# Patient Record
Sex: Male | Born: 1960
Health system: Southern US, Community
[De-identification: ages and names within clinical notes are randomized; demographics above are authoritative.]

## PROBLEM LIST (undated history)

## (undated) ENCOUNTER — Emergency Department (HOSPITAL_COMMUNITY): Admission: EM | Payer: Medicare Other | Source: Home / Self Care

## (undated) DIAGNOSIS — E785 Hyperlipidemia, unspecified: Secondary | ICD-10-CM

## (undated) DIAGNOSIS — I10 Essential (primary) hypertension: Secondary | ICD-10-CM

## (undated) DIAGNOSIS — E11621 Type 2 diabetes mellitus with foot ulcer: Secondary | ICD-10-CM

## (undated) DIAGNOSIS — I440 Atrioventricular block, first degree: Secondary | ICD-10-CM

## (undated) DIAGNOSIS — F329 Major depressive disorder, single episode, unspecified: Secondary | ICD-10-CM

## (undated) DIAGNOSIS — M1990 Unspecified osteoarthritis, unspecified site: Secondary | ICD-10-CM

## (undated) DIAGNOSIS — E1142 Type 2 diabetes mellitus with diabetic polyneuropathy: Secondary | ICD-10-CM

## (undated) DIAGNOSIS — Z794 Long term (current) use of insulin: Secondary | ICD-10-CM

## (undated) DIAGNOSIS — G629 Polyneuropathy, unspecified: Secondary | ICD-10-CM

## (undated) DIAGNOSIS — Z8739 Personal history of other diseases of the musculoskeletal system and connective tissue: Secondary | ICD-10-CM

## (undated) DIAGNOSIS — F419 Anxiety disorder, unspecified: Secondary | ICD-10-CM

## (undated) DIAGNOSIS — E119 Type 2 diabetes mellitus without complications: Secondary | ICD-10-CM

## (undated) DIAGNOSIS — L97519 Non-pressure chronic ulcer of other part of right foot with unspecified severity: Secondary | ICD-10-CM

## (undated) DIAGNOSIS — N182 Chronic kidney disease, stage 2 (mild): Secondary | ICD-10-CM

## (undated) DIAGNOSIS — K219 Gastro-esophageal reflux disease without esophagitis: Secondary | ICD-10-CM

## (undated) HISTORY — PX: TOE AMPUTATION: SHX809

## (undated) HISTORY — PX: EYE SURGERY: SHX253

---
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
## 2007-12-18 ENCOUNTER — Emergency Department (HOSPITAL_COMMUNITY): Admission: EM | Admit: 2007-12-18 | Discharge: 2007-12-18 | Payer: Self-pay | Admitting: Emergency Medicine

## 2007-12-18 IMAGING — CT CT PELVIS W/O CM
2 of 4 series · 17 of 46 positions shown, 19 images · IV contrast (agent unspecified)
Comparison: None.

CLINICAL DATA: Left flank pain for 2 weeks increasing today.  Pain with urination.  Question ureteral calculus. 
 ABDOMEN CT WITHOUT CONTRAST:
TECHNIQUE: Multidetector CT imaging of the abdomen was performed following the standard protocol without IV contrast.
TECHNIQUE: Multidetector CT imaging of the pelvis was performed following the standard protocol without IV contrast.

[Series 2: stone_wo 5.0 b40f st · axial · 0.81mm/px · z∈[+679,+1155]mm · 14 of 131 slices shown, 16 images]
[im 6/131  soft-tissue]
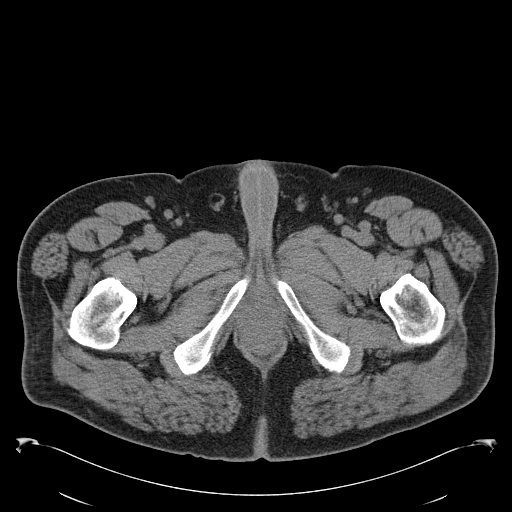
[im 6/131  bone]
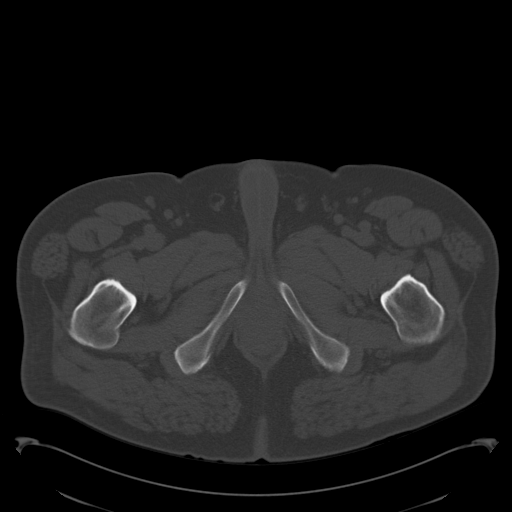
[im 16/131  soft-tissue]
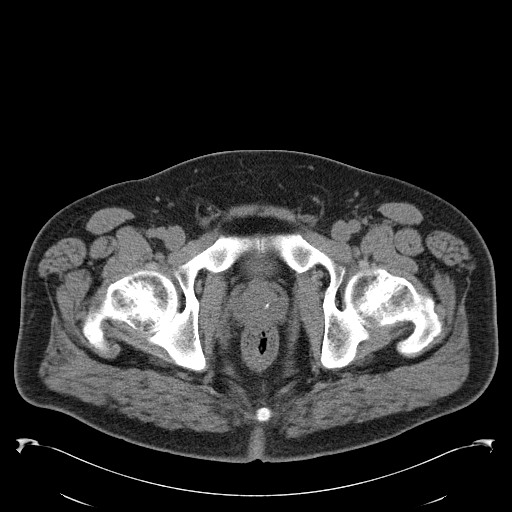
[im 27/131  soft-tissue]
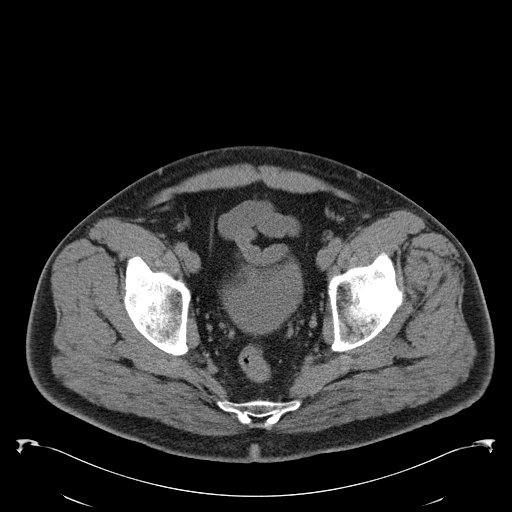
[im 37/131  soft-tissue]
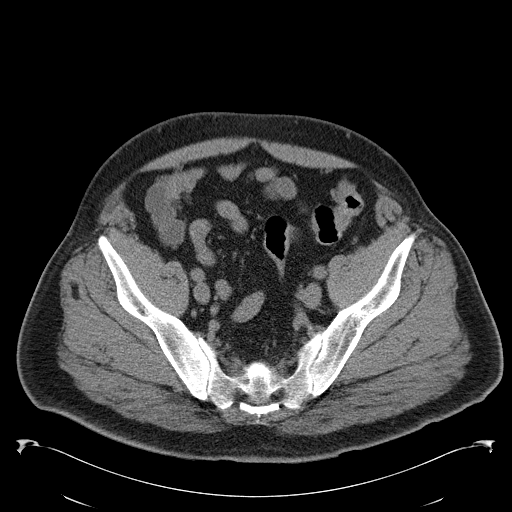
[im 42/131  soft-tissue]
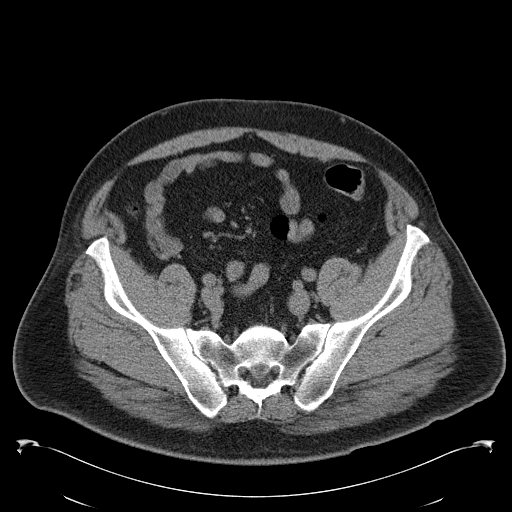
[im 53/131  soft-tissue]
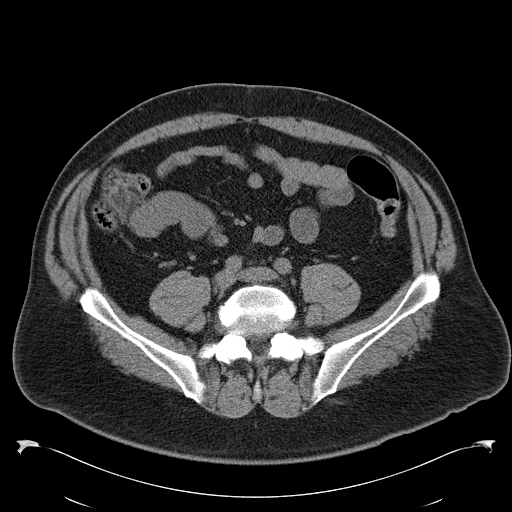
[im 63/131  soft-tissue]
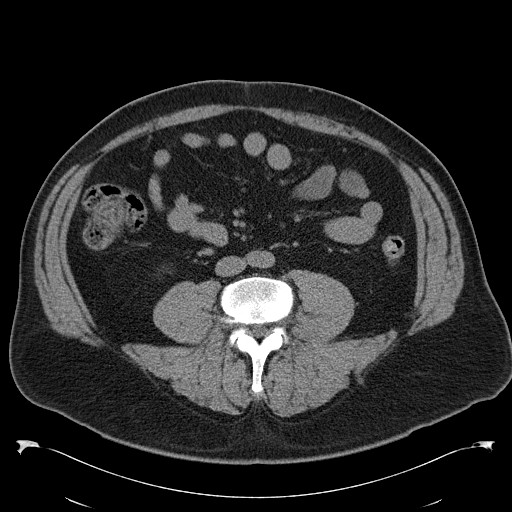
[im 68/131  soft-tissue]
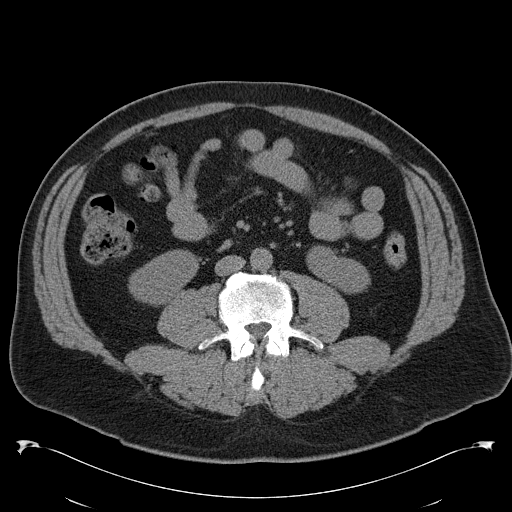
[im 79/131  soft-tissue]
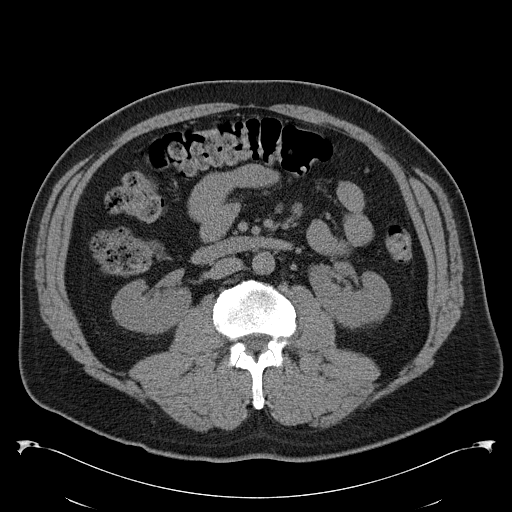
[im 79/131  bone]
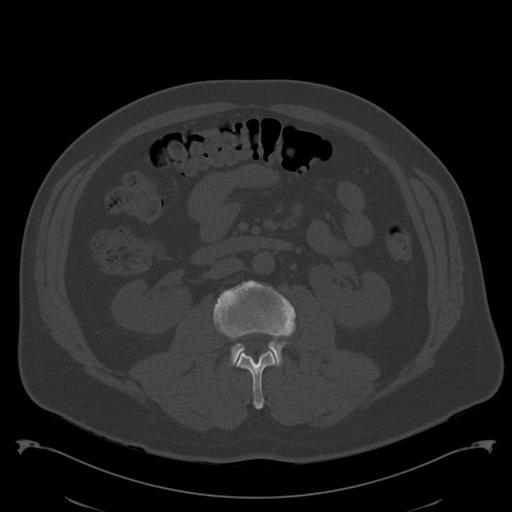
[im 89/131  soft-tissue]
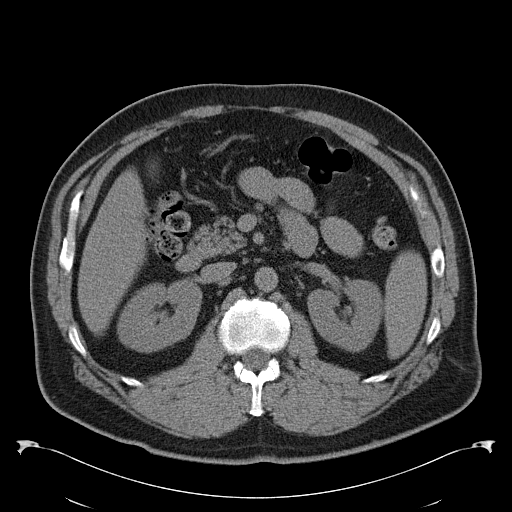
[im 99/131  soft-tissue]
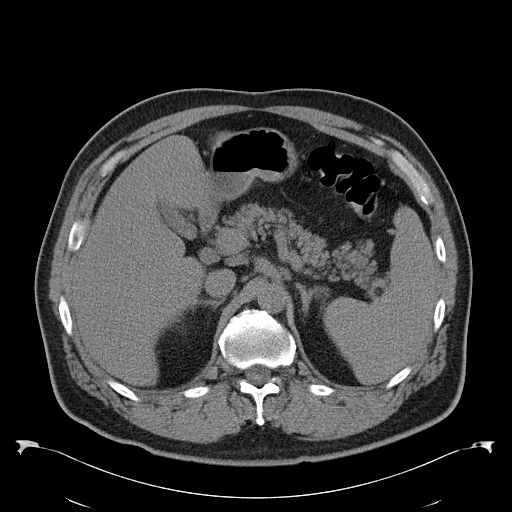
[im 105/131  soft-tissue]
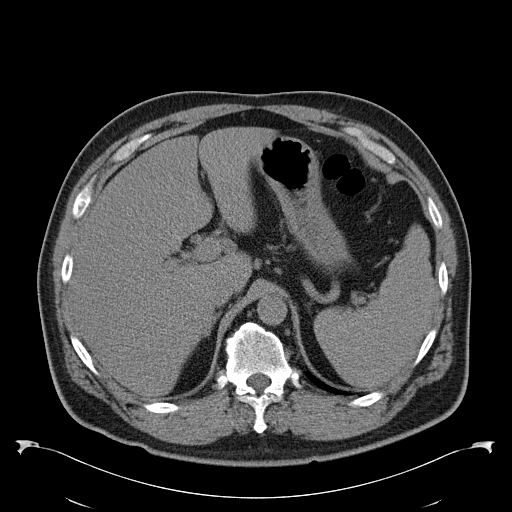
[im 115/131  soft-tissue]
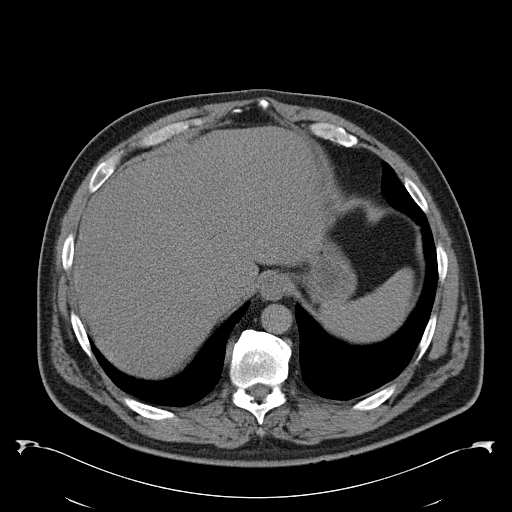
[im 125/131  soft-tissue]
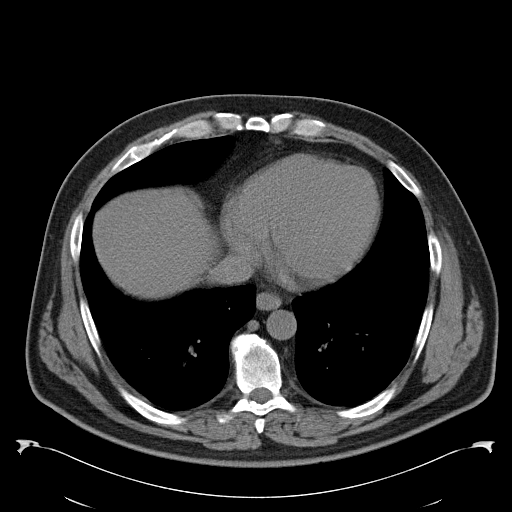

[Series 602: coronal · coronal · 1.06mm/px · 3 of 87 slices shown]
[im 29/87  soft-tissue]
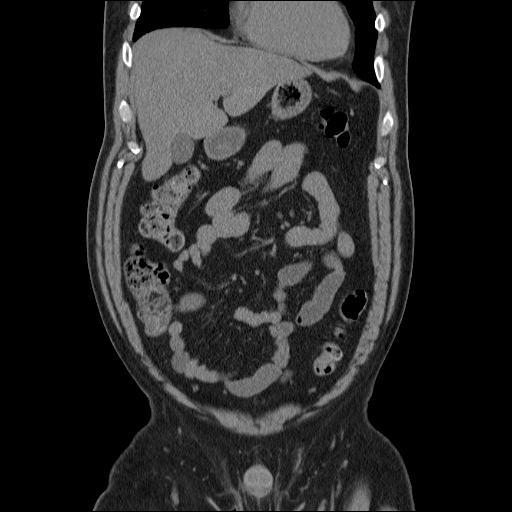
[im 39/87  soft-tissue]
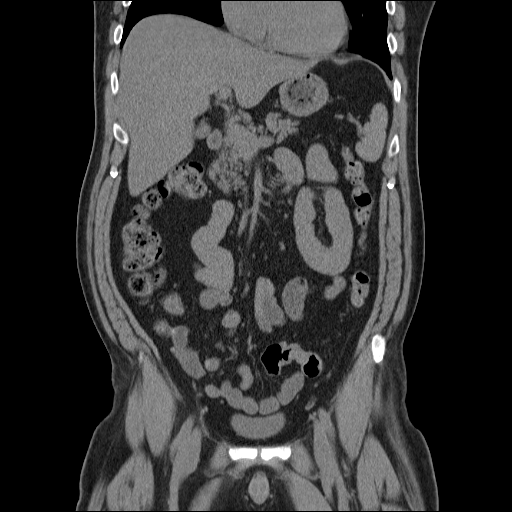
[im 48/87  soft-tissue]
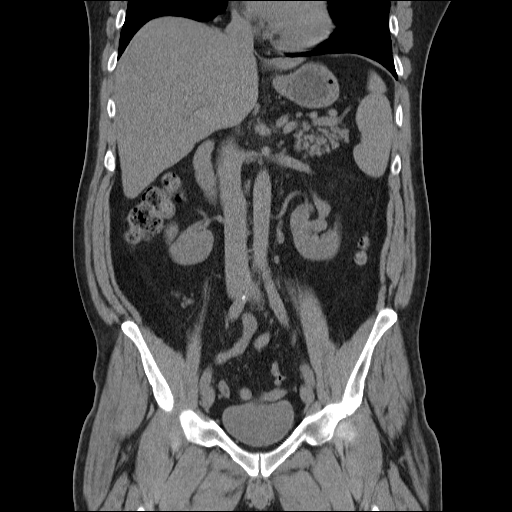

[17 of 46 positions shown; findings below may reference images not displayed]

FINDINGS: No renal or ureteral calculi are demonstrated.  There is no hydronephrosis or perinephric soft tissue stranding.  Both kidneys demonstrate an atypical orientation with outward rotation.  There is no horseshoe kidney configuration, though.  No focal renal abnormalities are seen. 
 The lung bases are clear.  There is no pleural effusion.  The liver, spleen, gallbladder, pancreas, and adrenal glands appear unremarkable as imaged in the unenhanced state.  Normal sized lymph nodes are present in the portocaval space.   There is a small calcified granuloma in the left lower lobe.
IMPRESSION: 1.  No evidence of urinary tract calculus or hydronephrosis.  
 2.  No acute abdominal findings. 
 PELVIS CT WITHOUT CONTRAST:
FINDINGS: Distally, the ureters are normal in caliber.  There is no evidence of ureteral or bladder calculus.  No pelvic mass, fluid collection, or inflammatory process is seen.  The appendix appears normal.
IMPRESSION: Negative for urinary tract calculus or hydronephrosis.  No acute pelvic findings.

## 2009-02-15 ENCOUNTER — Emergency Department (HOSPITAL_COMMUNITY): Admission: EM | Admit: 2009-02-15 | Discharge: 2009-02-15 | Payer: Self-pay | Admitting: Emergency Medicine

## 2011-08-07 LAB — POCT URINE HEMOGLOBIN
Hgb urine dipstick: NEGATIVE
Operator id: 264421

## 2011-08-07 LAB — COMPREHENSIVE METABOLIC PANEL
BUN: 11
Calcium: 9.5
Creatinine, Ser: 0.83
Glucose, Bld: 274 — ABNORMAL HIGH
Total Protein: 6.6

## 2011-08-07 LAB — URINALYSIS, ROUTINE W REFLEX MICROSCOPIC
Bilirubin Urine: NEGATIVE
Leukocytes, UA: NEGATIVE
Nitrite: NEGATIVE
Specific Gravity, Urine: 1.04 — ABNORMAL HIGH
pH: 6.5

## 2014-02-28 ENCOUNTER — Other Ambulatory Visit (HOSPITAL_COMMUNITY): Payer: Self-pay | Admitting: Internal Medicine

## 2014-02-28 ENCOUNTER — Ambulatory Visit (HOSPITAL_COMMUNITY)
Admission: RE | Admit: 2014-02-28 | Discharge: 2014-02-28 | Disposition: A | Discharge status: Home / Self Care | Payer: No Typology Code available for payment source | Source: Ambulatory Visit | Attending: Vascular Surgery | Admitting: Vascular Surgery

## 2014-02-28 ENCOUNTER — Encounter (HOSPITAL_COMMUNITY): Payer: Self-pay

## 2014-02-28 DIAGNOSIS — L97509 Non-pressure chronic ulcer of other part of unspecified foot with unspecified severity: Secondary | ICD-10-CM

## 2014-02-28 DIAGNOSIS — L97409 Non-pressure chronic ulcer of unspecified heel and midfoot with unspecified severity: Secondary | ICD-10-CM | POA: Insufficient documentation

## 2014-07-11 ENCOUNTER — Ambulatory Visit: Payer: Self-pay

## 2014-09-05 ENCOUNTER — Ambulatory Visit: Payer: No Typology Code available for payment source | Admitting: Podiatry

## 2014-09-12 ENCOUNTER — Ambulatory Visit: Payer: No Typology Code available for payment source | Admitting: Podiatry

## 2014-09-24 ENCOUNTER — Ambulatory Visit (INDEPENDENT_AMBULATORY_CARE_PROVIDER_SITE_OTHER): Payer: No Typology Code available for payment source

## 2014-09-24 ENCOUNTER — Encounter: Payer: Self-pay | Admitting: Podiatry

## 2014-09-24 ENCOUNTER — Ambulatory Visit (INDEPENDENT_AMBULATORY_CARE_PROVIDER_SITE_OTHER): Payer: No Typology Code available for payment source | Admitting: Podiatry

## 2014-09-24 VITALS — BP 133/86 | HR 91 | Resp 16 | Ht 72.0 in | Wt 259.0 lb

## 2014-09-24 DIAGNOSIS — L97521 Non-pressure chronic ulcer of other part of left foot limited to breakdown of skin: Secondary | ICD-10-CM

## 2014-09-24 NOTE — Progress Notes (Signed)
   Subjective:    Patient ID: Charles RayaKenneth Thompson, male    DOB: 17-Jun-1961, 53 y.o.   MRN: [id number]  HPI Comments: "I have something with this foot"  Patient c/o aching plantar 1st toe left for about 8 months. He has been trying to walk for exercise. He developed some calluses. He soaks his feet and pulled some thick skin off the toe and caused a hole. The area hasn't healed. He has been using antibiotic ointment.     Review of Systems  All other systems reviewed and are negative.      Objective:   Physical Exam        Assessment & Plan:

## 2014-09-24 NOTE — Patient Instructions (Signed)
Diabetes and Foot Care Diabetes may cause you to have problems because of poor blood supply (circulation) to your feet and legs. This may cause the skin on your feet to become thinner, break easier, and heal more slowly. Your skin may become dry, and the skin may peel and crack. You may also have nerve damage in your legs and feet causing decreased feeling in them. You may not notice minor injuries to your feet that could lead to infections or more serious problems. Taking care of your feet is one of the most important things you can do for yourself.  HOME CARE INSTRUCTIONS  Wear shoes at all times, even in the house. Do not go barefoot. Bare feet are easily injured.  Check your feet daily for blisters, cuts, and redness. If you cannot see the bottom of your feet, use a mirror or ask someone for help.  Wash your feet with warm water (do not use hot water) and mild soap. Then pat your feet and the areas between your toes until they are completely dry. Do not soak your feet as this can dry your skin.  Apply a moisturizing lotion or petroleum jelly (that does not contain alcohol and is unscented) to the skin on your feet and to dry, brittle toenails. Do not apply lotion between your toes.  Trim your toenails straight across. Do not dig under them or around the cuticle. File the edges of your nails with an emery board or nail file.  Do not cut corns or calluses or try to remove them with medicine.  Wear clean socks or stockings every day. Make sure they are not too tight. Do not wear knee-high stockings since they may decrease blood flow to your legs.  Wear shoes that fit properly and have enough cushioning. To break in new shoes, wear them for just a few hours a day. This prevents you from injuring your feet. Always look in your shoes before you put them on to be sure there are no objects inside.  Do not cross your legs. This may decrease the blood flow to your feet.  If you find a minor scrape,  cut, or break in the skin on your feet, keep it and the skin around it clean and dry. These areas may be cleansed with mild soap and water. Do not cleanse the area with peroxide, alcohol, or iodine.  When you remove an adhesive bandage, be sure not to damage the skin around it.  If you have a wound, look at it several times a day to make sure it is healing.  Do not use heating pads or hot water bottles. They may burn your skin. If you have lost feeling in your feet or legs, you may not know it is happening until it is too late.  Make sure your health care provider performs a complete foot exam at least annually or more often if you have foot problems. Report any cuts, sores, or bruises to your health care provider immediately. SEEK MEDICAL CARE IF:   You have an injury that is not healing.  You have cuts or breaks in the skin.  You have an ingrown nail.  You notice redness on your legs or feet.  You feel burning or tingling in your legs or feet.  You have pain or cramps in your legs and feet.  Your legs or feet are numb.  Your feet always feel cold. SEEK IMMEDIATE MEDICAL CARE IF:   There is increasing redness,   swelling, or pain in or around a wound.  There is a red line that goes up your leg.  Pus is coming from a wound.  You develop a fever or as directed by your health care provider.  You notice a bad smell coming from an ulcer or wound. Document Released: 10/30/2000 Document Revised: 07/05/2013 Document Reviewed: 04/11/2013 [employer] Patient Information 2015 [employer]. This information is not intended to replace advice given to you by your health care provider. Make sure you discuss any questions you have with your health care provider.  

## 2014-09-25 NOTE — Progress Notes (Signed)
Subjective:     Patient ID: Charles Thompson, male   DOB: June 28, 1961, 53 y.o.   MRN: [id number]  HPIpatient presents stating he's had a lesion on the bottom of his big toe for 8 months left heel been trying to walk for exercise developed calluses and then this broke down. He is a long-term diabetic who is not in very good control and is trying to exercise in order to improve his physical condition   Review of Systems  All other systems reviewed and are negative.      Objective:   Physical Exam  Constitutional: He is oriented to person, place, and time.  Cardiovascular: Intact distal pulses.   Musculoskeletal: Normal range of motion.  Neurological: He is oriented to person, place, and time.  Skin: Skin is warm and dry.  Nursing note and vitals reviewed. neurological sensation is diminished on both feet both sharp dull and vibratory. He is noted to have palpable pulses and has a relatively high arch foot type with severe keratotic lesion underneath the hallux right and a keratotic lesion left with a breakdown in the center portion with no odor noted and mild erythema in the hallux with no proximal edema erythema or drainage noted. Patient is noted to have good digital perfusion and is well oriented 3     Assessment:     Structural changes leading to chronic callus formation and along with neuropathy has led to a breakdown of tissue plantar aspect left that is chronic in nature and is localized    Plan:     H&P and x-rays reviewed. At this time deep debridement of lesion accomplished and I applied Iodosorb and thick padding to try to take pressure off the area. We will review this again in the next several weeks and he is also placed on cephalexin 500 mg 3 times a day. I explained there is no guarantee that this will heal and that ultimately he may require hallux amputation

## 2014-10-01 ENCOUNTER — Ambulatory Visit (INDEPENDENT_AMBULATORY_CARE_PROVIDER_SITE_OTHER): Payer: No Typology Code available for payment source | Admitting: Podiatry

## 2014-10-01 ENCOUNTER — Encounter: Payer: Self-pay | Admitting: Podiatry

## 2014-10-01 VITALS — BP 140/71 | HR 90 | Resp 16

## 2014-10-01 DIAGNOSIS — L97521 Non-pressure chronic ulcer of other part of left foot limited to breakdown of skin: Secondary | ICD-10-CM

## 2014-10-01 MED ORDER — CADEXOMER IODINE 0.9 % EX GEL: 1.0000 "application " | Freq: Every day | CUTANEOUS | Status: DC | PRN

## 2014-10-02 NOTE — Progress Notes (Signed)
Subjective:     Patient ID: Charles Thompson, male   DOB: 15-Jan-1961, 53 y.o.   MRN: [id number]  HPIpatient states my toe seems a little better and I like to continue to try to heal this   Review of Systems     Objective:   Physical Exam Neurovascular status unchanged with ulceration plantar left hallux in a patient long-term diabetic with relatively extensive neuropathic changes    Assessment:     Ulcerations secondary to compression and diabetes with diminished neurological function    Plan:     Reviewed condition and debrided area up flushed and applied Iodosorb and wrote him Iodosorb for home usage and continued padding usage. No current erythema in the digits but if it were to turn red become swollen or started draining patient is to let us know immediately and if he should develop any systemic signs of infection he is to go straight to the emergency room

## 2015-04-17 ENCOUNTER — Emergency Department (HOSPITAL_COMMUNITY): Payer: No Typology Code available for payment source

## 2015-04-17 ENCOUNTER — Encounter (HOSPITAL_COMMUNITY): Payer: Self-pay | Admitting: Emergency Medicine

## 2015-04-17 ENCOUNTER — Emergency Department (HOSPITAL_COMMUNITY)
Admission: EM | Admit: 2015-04-17 | Discharge: 2015-04-17 | Disposition: A | Discharge status: Home / Self Care | Payer: No Typology Code available for payment source | Attending: Emergency Medicine | Admitting: Emergency Medicine

## 2015-04-17 DIAGNOSIS — Z7982 Long term (current) use of aspirin: Secondary | ICD-10-CM | POA: Insufficient documentation

## 2015-04-17 DIAGNOSIS — Z720 Tobacco use: Secondary | ICD-10-CM | POA: Insufficient documentation

## 2015-04-17 DIAGNOSIS — L089 Local infection of the skin and subcutaneous tissue, unspecified: Secondary | ICD-10-CM | POA: Diagnosis present

## 2015-04-17 DIAGNOSIS — E11621 Type 2 diabetes mellitus with foot ulcer: Secondary | ICD-10-CM | POA: Insufficient documentation

## 2015-04-17 DIAGNOSIS — Z79899 Other long term (current) drug therapy: Secondary | ICD-10-CM | POA: Diagnosis not present

## 2015-04-17 DIAGNOSIS — Z8669 Personal history of other diseases of the nervous system and sense organs: Secondary | ICD-10-CM | POA: Diagnosis not present

## 2015-04-17 DIAGNOSIS — L97519 Non-pressure chronic ulcer of other part of right foot with unspecified severity: Secondary | ICD-10-CM | POA: Diagnosis not present

## 2015-04-17 DIAGNOSIS — I10 Essential (primary) hypertension: Secondary | ICD-10-CM | POA: Diagnosis not present

## 2015-04-17 DIAGNOSIS — L97509 Non-pressure chronic ulcer of other part of unspecified foot with unspecified severity: Secondary | ICD-10-CM

## 2015-04-17 DIAGNOSIS — Z794 Long term (current) use of insulin: Secondary | ICD-10-CM | POA: Insufficient documentation

## 2015-04-17 HISTORY — DX: Type 2 diabetes mellitus without complications: E11.9

## 2015-04-17 HISTORY — DX: Essential (primary) hypertension: I10

## 2015-04-17 HISTORY — DX: Polyneuropathy, unspecified: G62.9

## 2015-04-17 LAB — COMPREHENSIVE METABOLIC PANEL
ALBUMIN: 4.4 g/dL (ref 3.5–5.0)
ALT: 42 U/L (ref 17–63)
ANION GAP: 12 (ref 5–15)
AST: 21 U/L (ref 15–41)
Alkaline Phosphatase: 81 U/L (ref 38–126)
BUN: 27 mg/dL — ABNORMAL HIGH (ref 6–20)
CHLORIDE: 97 mmol/L — AB (ref 101–111)
CO2: 25 mmol/L (ref 22–32)
Calcium: 9.9 mg/dL (ref 8.9–10.3)
Creatinine, Ser: 1.19 mg/dL (ref 0.61–1.24)
Glucose, Bld: 187 mg/dL — ABNORMAL HIGH (ref 65–99)
Potassium: 4.2 mmol/L (ref 3.5–5.1)
Sodium: 134 mmol/L — ABNORMAL LOW (ref 135–145)
Total Bilirubin: 0.6 mg/dL (ref 0.3–1.2)
Total Protein: 7.8 g/dL (ref 6.5–8.1)

## 2015-04-17 LAB — CBC WITH DIFFERENTIAL/PLATELET
Basophils Absolute: 0 10*3/uL (ref 0.0–0.1)
Basophils Relative: 0 % (ref 0–1)
EOS PCT: 2 % (ref 0–5)
Eosinophils Absolute: 0.2 10*3/uL (ref 0.0–0.7)
HEMATOCRIT: 47.6 % (ref 39.0–52.0)
HEMOGLOBIN: 16.7 g/dL (ref 13.0–17.0)
LYMPHS ABS: 2.7 10*3/uL (ref 0.7–4.0)
Lymphocytes Relative: 28 % (ref 12–46)
MCH: 30.2 pg (ref 26.0–34.0)
MCHC: 35.1 g/dL (ref 30.0–36.0)
MCV: 86.1 fL (ref 78.0–100.0)
MONO ABS: 0.7 10*3/uL (ref 0.1–1.0)
MONOS PCT: 7 % (ref 3–12)
NEUTROS ABS: 6.2 10*3/uL (ref 1.7–7.7)
Neutrophils Relative %: 63 % (ref 43–77)
PLATELETS: 260 10*3/uL (ref 150–400)
RBC: 5.53 MIL/uL (ref 4.22–5.81)
RDW: 12.4 % (ref 11.5–15.5)
WBC: 9.8 10*3/uL (ref 4.0–10.5)

## 2015-04-17 IMAGING — CR DG TOE 3RD 2+V*R*
4 series · 4 of 4 positions shown · non-contrast
Comparison: [DATE].

CLINICAL DATA: RIGHT third toe wound. Injury 3 days ago.Diabetic
foot wound.

EXAM:
RIGHT THIRD TOE

[x toes ap right]
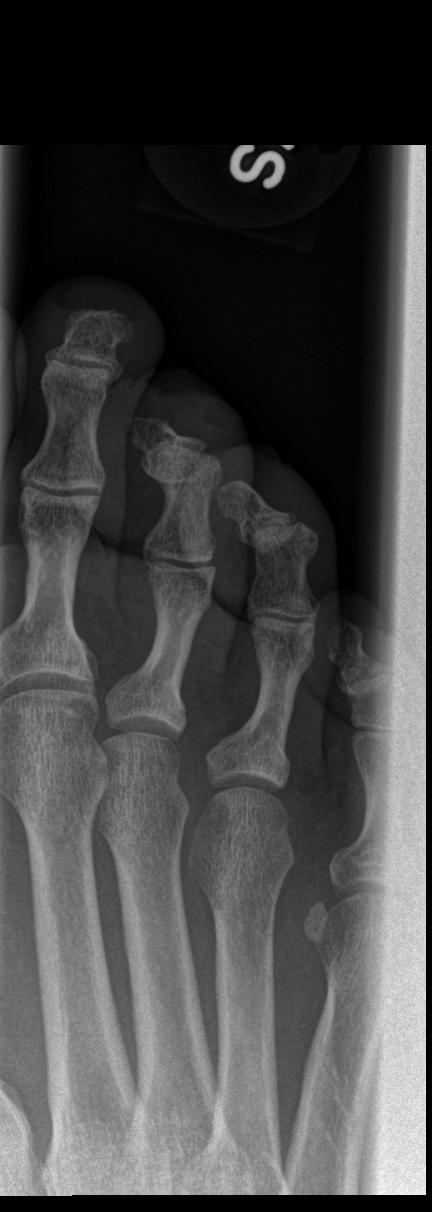

[x toes obl right]
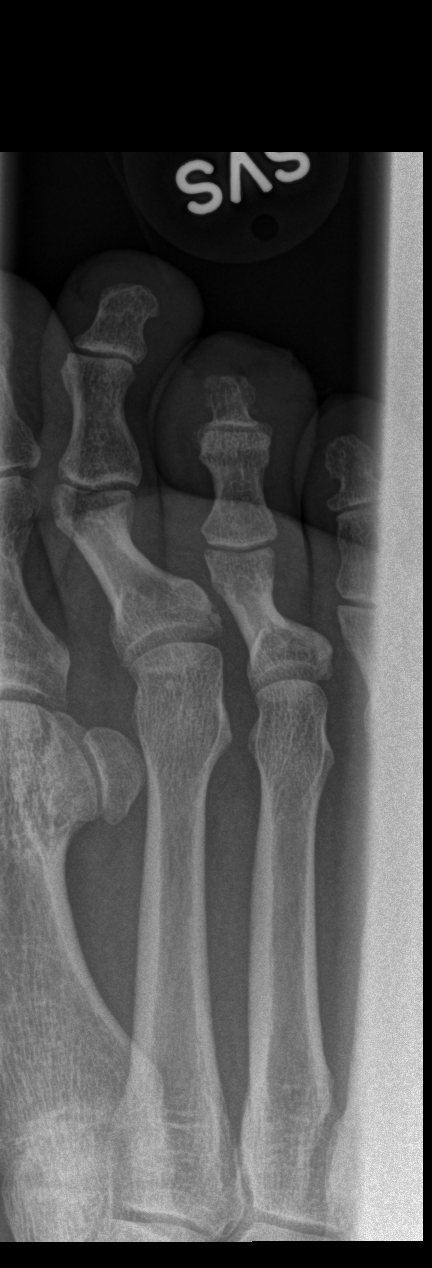

[x toes lat right (1 of 2)]
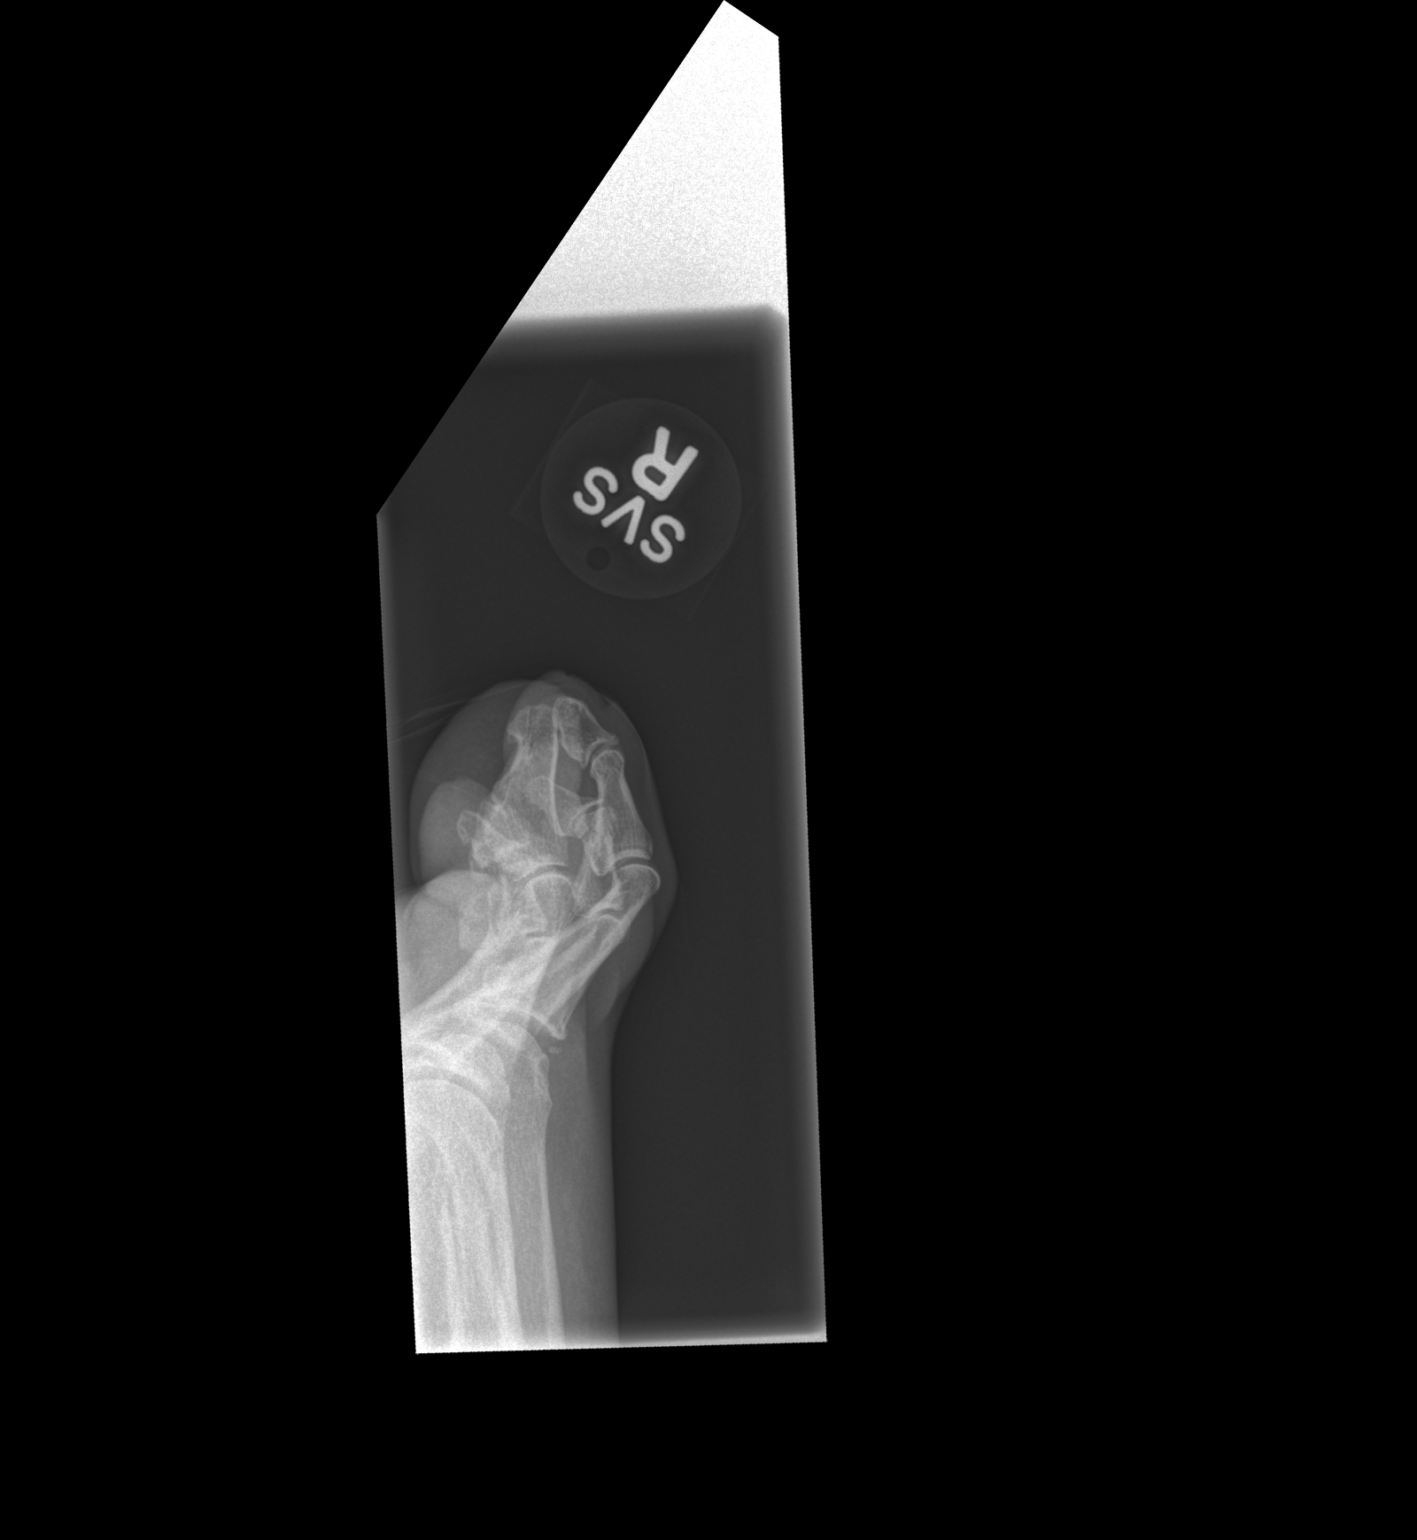

[x toes lat right (2 of 2)]
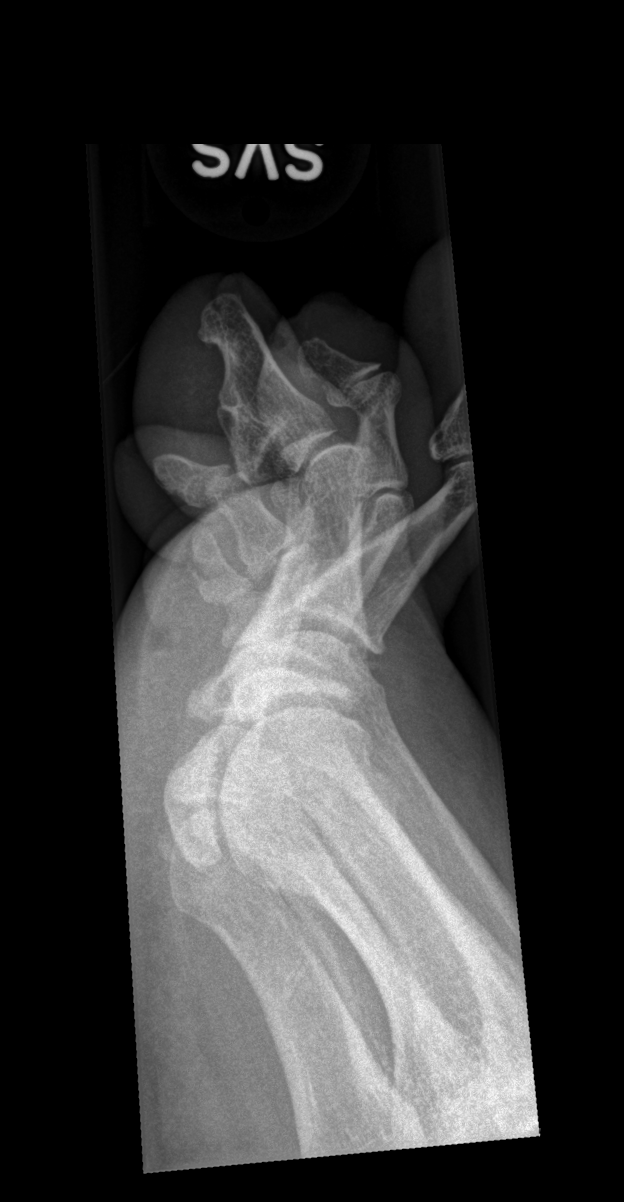

[4 of 4 positions shown; findings below may reference images not displayed]

FINDINGS: Soft tissue swelling is present in the third toe. There is soft
tissue irregularity over the terminal phalanx, likely the clinically
described wound. No plain film evidence of osteomyelitis. No gas
dissecting in the soft tissues. Flexion deformity of the toes noted
on radiographs.
IMPRESSION: Soft tissue swelling and irregularity in the third toe without
osteomyelitis.

## 2015-04-17 MED ORDER — DOXYCYCLINE HYCLATE 100 MG PO TABS: 100.0000 mg | ORAL_TABLET | Freq: Two times a day (BID) | ORAL | Status: DC

## 2015-04-17 MED ORDER — CEPHALEXIN 500 MG PO CAPS
500.0000 mg | ORAL_CAPSULE | Freq: Once | ORAL | Status: AC
  Administered 2015-04-17: 500 mg via ORAL
  Filled 2015-04-17: qty 1

## 2015-04-17 MED ORDER — DOXYCYCLINE HYCLATE 100 MG PO TABS
100.0000 mg | ORAL_TABLET | Freq: Once | ORAL | Status: AC
  Administered 2015-04-17: 100 mg via ORAL
  Filled 2015-04-17: qty 1

## 2015-04-17 MED ORDER — CEPHALEXIN 500 MG PO CAPS: 500.0000 mg | ORAL_CAPSULE | Freq: Two times a day (BID) | ORAL | Status: DC

## 2015-04-17 NOTE — ED Provider Notes (Signed)
CSN: [id number]     Arrival date & time 04/17/15  1822 History   First MD Initiated Contact with Patient 04/17/15 1942     Chief Complaint  Patient presents with  . Recurrent Skin Infections     (Consider location/radiation/quality/duration/timing/severity/associated sxs/prior Treatment) HPI  Ladean RayaKenneth Pizano is a 54 y.o. male with past medical history of diabetes and peripheral nephropathy presenting today with right middle toe drainage.  3 days ago patient had a corn on his toe which he attempted to cut with a Reeser at home. Subsequently he developed swelling in his foot, erythema of the toe and bloody drainage. He denies any purulent drainage. He saw his podiatrist today recommended he come to emergency department for treatment of cellulitis. He denies any fevers or chills. Patient does not have any significant pain in the area but does have a history of diabetic neuropathy. He has no further complaints.  10 Systems reviewed and are negative for acute change except as noted in the HPI.    Past Medical History  Diagnosis Date  . Peripheral neuropathy   . Diabetes mellitus without complication   . Hypertension    Past Surgical History  Procedure Laterality Date  . Eye surgery     No family history on file. History  Substance Use Topics  . Smoking status: Current Some Day Smoker    Types: Cigarettes  . Smokeless tobacco: Not on file  . Alcohol Use: No    Review of Systems    Allergies  Review of patient's allergies indicates no known allergies.  Home Medications   Prior to Admission medications   Medication Sig Start Date End Date Taking? Authorizing Provider  amLODipine (NORVASC) 10 MG tablet Take 10 mg by mouth daily. 04/04/15  Yes Historical Provider, MD  aspirin 81 MG tablet Take 81 mg by mouth daily.   Yes Historical Provider, MD  atorvastatin (LIPITOR) 10 MG tablet Take 10 mg by mouth daily. 04/04/15  Yes Historical Provider, MD  INVOKANA 300 MG TABS tablet Take 300  mg by mouth daily. 04/04/15  Yes Historical Provider, MD  KOMBIGLYZE XR 2.03-999 MG TB24 Take 1 tablet by mouth 2 (two) times daily. 04/04/15  Yes Historical Provider, MD  omeprazole (PRILOSEC) 20 MG capsule Take 20 mg by mouth daily as needed (heartburn).  02/08/15  Yes Historical Provider, MD  TOUJEO SOLOSTAR 300 UNIT/ML SOPN Inject 65 Units into the skin 2 (two) times daily. 03/25/15  Yes Historical Provider, MD  cadexomer iodine (IODOSORB) 0.9 % gel Apply 1 application topically daily as needed for wound care. Patient not taking: Reported on 04/17/2015 10/01/14   Kirstie PeriNorman S Regal, DPM   BP 133/106 mmHg  Pulse 82  Temp(Src) 97.9 F (36.6 C) (Oral)  Resp 20  SpO2 99% Physical Exam  Constitutional: He is oriented to person, place, and time. Vital signs are normal. He appears well-developed and well-nourished.  Non-toxic appearance. He does not appear ill. No distress.  HENT:  Head: Normocephalic and atraumatic.  Nose: Nose normal.  Mouth/Throat: Oropharynx is clear and moist. No oropharyngeal exudate.  Eyes: Conjunctivae and EOM are normal. Pupils are equal, round, and reactive to light. No scleral icterus.  Neck: Normal range of motion. Neck supple. No tracheal deviation, no edema, no erythema and normal range of motion present. No thyroid mass and no thyromegaly present.  Cardiovascular: Normal rate, regular rhythm, S1 normal, S2 normal, normal heart sounds, intact distal pulses and normal pulses.  Exam reveals no gallop and no  friction rub.   No murmur heard. Pulses:      Radial pulses are 2+ on the right side, and 2+ on the left side.       Dorsalis pedis pulses are 2+ on the right side, and 2+ on the left side.  Pulmonary/Chest: Effort normal and breath sounds normal. No respiratory distress. He has no wheezes. He has no rhonchi. He has no rales.  Abdominal: Soft. Normal appearance and bowel sounds are normal. He exhibits no distension, no ascites and no mass. There is no hepatosplenomegaly.  There is no tenderness. There is no rebound, no guarding and no CVA tenderness.  Musculoskeletal: Normal range of motion. He exhibits no edema or tenderness.  Right third toe with diabetic ulcer at the distal tip. It is malodorous, no purulent drainage. No tenderness to palpation. There is erythema of the right third toe as well. Mild swelling seen to the ankle.  Lymphadenopathy:    He has no cervical adenopathy.  Neurological: He is alert and oriented to person, place, and time. He has normal strength. No cranial nerve deficit or sensory deficit.  Skin: Skin is warm, dry and intact. No petechiae and no rash noted. He is not diaphoretic. No erythema. No pallor.  Psychiatric: He has a normal mood and affect. His behavior is normal. Judgment normal.  Nursing note and vitals reviewed.   ED Course  Procedures (including critical care time) Labs Review Labs Reviewed  COMPREHENSIVE METABOLIC PANEL - Abnormal; Notable for the following:    Sodium 134 (*)    Chloride 97 (*)    Glucose, Bld 187 (*)    BUN 27 (*)    All other components within normal limits  CBC WITH DIFFERENTIAL/PLATELET    Imaging Review Dg Toe 3rd Right  04/17/2015   CLINICAL DATA:  RIGHT third toe wound. Injury 3 days ago.Diabetic foot wound.  EXAM: RIGHT THIRD TOE  COMPARISON:  09/24/2014.  FINDINGS: Soft tissue swelling is present in the third toe. There is soft tissue irregularity over the terminal phalanx, likely the clinically described wound. No plain film evidence of osteomyelitis. No gas dissecting in the soft tissues. Flexion deformity of the toes noted on radiographs.  IMPRESSION: Soft tissue swelling and irregularity in the third toe without osteomyelitis.   Electronically Signed   By: Andreas Newport M.D.   On: 04/17/2015 20:24     EKG Interpretation None      MDM   Final diagnoses:  None    Patient presents emergency department for infection of the right third toe. Appears to be a diabetic ulcer that is  not odorous. Likely soft tissue infection. Will obtain x-ray to evaluate for possible osteomyelitis.  Xray negative for osteo.  We'll treat for 2 weeks with Keflex and doxycycline for infection. Podiatry follow-up was advised within 3 days. Patient overall appears well in no acute distress. Vital signs remain within his normal limits and he is safe for discharge.  Tomasita Crumble, MD 04/17/15 2045

## 2015-04-17 NOTE — ED Notes (Signed)
Patient transported to X-ray 

## 2015-04-17 NOTE — Discharge Instructions (Signed)
[facility] Charles Thompson, your x-ray does not show any infection of the bone. Take antibodies for 2 weeks and see your podiatrist within 3 days for close follow-up. Be sure to wash your toe twice per day very well with soap and water. If symptoms worsen come back to the emergency department immediately. Thank you. Diabetes may cause you to have problems because of poor blood supply (circulation) to your feet and legs. This may cause the skin on your feet to become thinner, break easier, and heal more slowly. Your skin may become dry, and the skin may peel and crack. You may also have nerve damage in your legs and feet causing decreased feeling in them. You may not notice minor injuries to your feet that could lead to infections or more serious problems. Taking care of your feet is one of the most important things you can do for yourself.  HOME CARE INSTRUCTIONS  Wear shoes at all times, even in the house. Do not go barefoot. Bare feet are easily injured.  Check your feet daily for blisters, cuts, and redness. If you cannot see the bottom of your feet, use a mirror or ask someone for help.  Wash your feet with warm water (do not use hot water) and mild soap. Then pat your feet and the areas between your toes until they are completely dry. Do not soak your feet as this can dry your skin.  Apply a moisturizing lotion or petroleum jelly (that does not contain alcohol and is unscented) to the skin on your feet and to dry, brittle toenails. Do not apply lotion between your toes.  Trim your toenails straight across. Do not dig under them or around the cuticle. File the edges of your nails with an emery board or nail file.  Do not cut corns or calluses or try to remove them with medicine.  Wear clean socks or stockings every day. Make sure they are not too tight. Do not wear knee-high stockings since they may decrease blood flow to your legs.  Wear shoes that fit properly and have enough  cushioning. To break in new shoes, wear them for just a few hours a day. This prevents you from injuring your feet. Always look in your shoes before you put them on to be sure there are no objects inside.  Do not cross your legs. This may decrease the blood flow to your feet.  If you find a minor scrape, cut, or break in the skin on your feet, keep it and the skin around it clean and dry. These areas may be cleansed with mild soap and water. Do not cleanse the area with peroxide, alcohol, or iodine.  When you remove an adhesive bandage, be sure not to damage the skin around it.  If you have a wound, look at it several times a day to make sure it is healing.  Do not use heating pads or hot water bottles. They may burn your skin. If you have lost feeling in your feet or legs, you may not know it is happening until it is too late.  Make sure your health care provider performs a complete foot exam at least annually or more often if you have foot problems. Report any cuts, sores, or bruises to your health care provider immediately. SEEK MEDICAL CARE IF:   You have an injury that is not healing.  You have cuts or breaks in the skin.  You have an ingrown nail.  You notice redness on your legs or feet.  You feel burning or tingling in your legs or feet.  You have pain or cramps in your legs and feet.  Your legs or feet are numb.  Your feet always feel cold. SEEK IMMEDIATE MEDICAL CARE IF:   There is increasing redness, swelling, or pain in or around a wound.  There is a red line that goes up your leg.  Pus is coming from a wound.  You develop a fever or as directed by your health care provider.  You notice a bad smell coming from an ulcer or wound. Document Released: 10/30/2000 Document Revised: 07/05/2013 Document Reviewed: 04/11/2013 [facility] Patient Information 2015 [place], [place]. This information is not intended to replace advice given to you by your health care provider. Make  sure you discuss any questions you have with your health care provider. How to Avoid Diabetes Problems You can do a lot to prevent or slow down diabetes problems. Following your diabetes plan and taking care of yourself can reduce your risk of serious or life-threatening complications. Below, you will find certain things you can do to prevent diabetes problems. MANAGE YOUR DIABETES Follow your health care provider's, nurse educator's, and dietitian's instructions for managing your diabetes. They will teach you the basics of diabetes care. They can help answer questions you may have. Learn about diabetes and make healthy choices regarding eating and physical activity. Monitor your blood glucose level regularly. Your health care provider will help you decide how often to check your blood glucose level depending on your treatment goals and how well you are meeting them.  DO NOT USE NICOTINE Nicotine and diabetes are a dangerous combination. Nicotine raises your risk for diabetes problems. If you quit using nicotine, you will lower your risk for heart attack, stroke, nerve disease, and kidney disease. Your cholesterol and your blood pressure levels may improve. Your blood circulation will also improve. Do not use any tobacco products, including cigarettes, chewing tobacco, or electronic cigarettes. If you need help quitting, ask your health care provider. KEEP YOUR BLOOD PRESSURE UNDER CONTROL Keeping your blood pressure under control will help prevent damage to your eyes, kidneys, heart, and blood vessels. Blood pressure consists of two numbers. The top number should be below 120, and the bottom number should be below 80 (120/80). Keep your blood pressure as close to these numbers as you can. If you already have kidney disease, you may want even lower blood pressure to protect your kidneys. Talk to your health care provider to make sure that your blood pressure goal is right for your needs. Meal planning,  medicines, and exercise can help you reach your blood pressure target. Have your blood pressure checked at every visit with your health care provider. KEEP YOUR CHOLESTEROL UNDER CONTROL Normal cholesterol levels will help prevent heart disease and stroke. These are the biggest health problems for people with diabetes. Keeping cholesterol levels under control can also help with blood flow. Have your cholesterol level checked at least once a year. Your health care provider may prescribe a medicine known as a statin. Statins lower your cholesterol. If you are not taking a statin, ask your health care provider if you should be. Meal planning, exercise, and medicines can help you reach your cholesterol targets.  SCHEDULE AND KEEP YOUR ANNUAL PHYSICAL EXAMS AND EYE EXAMS Your health care provider will tell you how often he or she wants to see you depending on your plan of treatment. It is  important that you keep these appointments so that possible problems can be identified early and complications can be avoided or treated.  Every visit with your health care provider should include your weight, blood pressure, and an evaluation of your blood glucose control.  Your hemoglobin A1c should be checked:  At least twice a year if you are at your goal.  Every 3 months if there are changes in treatment.  If you are not meeting your goals.  Your blood lipids should be checked yearly. You should also be checked yearly to see if you have protein in your urine (microalbumin).  Schedule a dilated eye exam within 5 years of your diagnosis if you have type 1 diabetes, and then yearly. Schedule a dilated eye exam at diagnosis if you have type 2 diabetes, and then yearly. All exams thereafter can be extended to every 2 to 3 years if one or more exams have been normal. KEEP YOUR VACCINES CURRENT The flu vaccine is recommended yearly. The formula for the vaccine changes every year and needs to be updated for the best  protection against current viruses. It is recommended that people with diabetes who are over 84 years old get the pneumonia vaccine. In some cases, two separate shots may be given. Ask your health care provider if your pneumonia vaccination is up-to-date. However, there are some instances where another vaccine is recommended. Check with your health care provider. TAKE CARE OF YOUR FEET  Diabetes may cause you to have a poor blood supply (circulation) to your legs and feet. Because of this, the skin may be thinner, break easier, and heal more slowly. You also may have nerve damage in your legs and feet, causing decreased feeling. You may not notice minor injuries to your feet that could lead to serious problems or infections. Taking care of your feet is very important. Visual foot exams are performed at every routine medical visit. The exams check for cuts, injuries, or other problems with the feet. A comprehensive foot exam should be done yearly. This includes visual inspection as well as assessing foot pulses and testing for loss of sensation. You should also do the following:  Inspect your feet daily for cuts, calluses, blisters, ingrown toenails, and signs of infection, such as redness, swelling, or pus.  Wash and dry your feet thoroughly, especially between the toes.  Avoid soaking your feet regularly in hot water baths.  Moisturize dry skin with lotion, avoiding areas between your toes.  Cut toenails straight across and file the edges.  Avoid shoes that do not fit well or have areas that irritate your skin.  Avoid going barefooted or wearing only socks. Your feet need protection. TAKE CARE OF YOUR TEETH People with poorly controlled diabetes are more likely to have gum (periodontal) disease. These infections make diabetes harder to control. Periodontal diseases, if left untreated, can lead to tooth loss. Brush your teeth twice a day, floss, and see your dentist for checkups and cleaning every  6 months, or 2 times a year. ASK YOUR HEALTH CARE PROVIDER ABOUT TAKING ASPIRIN Taking aspirin daily is recommended to help prevent cardiovascular disease in people with and without diabetes. Ask your health care provider if this would benefit you and what dose he or she would recommend. DRINK RESPONSIBLY Moderate amounts of alcohol (less than 1 drink per day for adult women and less than 2 drinks per day for adult men) have a minimal effect on blood glucose if ingested with food. It is  important to eat food with alcohol to avoid hypoglycemia. People should avoid alcohol if they have a history of alcohol abuse or dependence, if they are pregnant, and if they have liver disease, pancreatitis, advanced neuropathy, or severe hypertriglyceridemia. LESSEN STRESS Living with diabetes can be stressful. When you are under stress, your blood glucose may be affected in two ways:  Stress hormones may cause your blood glucose to rise.  You may be distracted from taking good care of yourself. It is a good idea to be aware of your stress level and make changes that are necessary to help you better manage challenging situations. Support groups, planned relaxation, a hobby you enjoy, meditation, healthy relationships, and exercise all work to lower your stress level. If your efforts do not seem to be helping, get help from your health care provider or a trained mental health professional. Document Released: 07/21/2011 Document Revised: 03/19/2014 Document Reviewed: 12/27/2013 [facility] Patient Information 2015 [place], [place]. This information is not intended to replace advice given to you by your health care provider. Make sure you discuss any questions you have with your health care provider.

## 2015-04-17 NOTE — ED Notes (Signed)
Pt states that he had corn on his right middle toe that where his toe nail should be and pt tried cutting it off. Pt states that toe is now infected, red, ans swollen. Pt denies any drainage besides blood.

## 2015-10-31 DIAGNOSIS — L97522 Non-pressure chronic ulcer of other part of left foot with fat layer exposed: Secondary | ICD-10-CM | POA: Insufficient documentation

## 2015-12-05 DIAGNOSIS — E1140 Type 2 diabetes mellitus with diabetic neuropathy, unspecified: Secondary | ICD-10-CM | POA: Insufficient documentation

## 2016-10-29 DIAGNOSIS — Z89411 Acquired absence of right great toe: Secondary | ICD-10-CM | POA: Insufficient documentation

## 2017-12-02 DIAGNOSIS — Z720 Tobacco use: Secondary | ICD-10-CM | POA: Insufficient documentation

## 2018-06-17 DIAGNOSIS — I6522 Occlusion and stenosis of left carotid artery: Secondary | ICD-10-CM | POA: Diagnosis not present

## 2018-06-17 DIAGNOSIS — L03116 Cellulitis of left lower limb: Secondary | ICD-10-CM | POA: Diagnosis not present

## 2018-06-17 DIAGNOSIS — M542 Cervicalgia: Secondary | ICD-10-CM | POA: Diagnosis not present

## 2018-06-17 DIAGNOSIS — E11621 Type 2 diabetes mellitus with foot ulcer: Secondary | ICD-10-CM | POA: Diagnosis not present

## 2018-06-20 ENCOUNTER — Observation Stay (HOSPITAL_COMMUNITY): Payer: Medicare HMO

## 2018-06-20 ENCOUNTER — Encounter (HOSPITAL_COMMUNITY): Payer: Self-pay

## 2018-06-20 ENCOUNTER — Observation Stay (HOSPITAL_BASED_OUTPATIENT_CLINIC_OR_DEPARTMENT_OTHER): Payer: Medicare HMO

## 2018-06-20 ENCOUNTER — Other Ambulatory Visit: Payer: Self-pay

## 2018-06-20 ENCOUNTER — Inpatient Hospital Stay (HOSPITAL_COMMUNITY)
Admission: EM | Admit: 2018-06-20 | Discharge: 2018-06-23 | DRG: 617 | Disposition: A | Discharge status: Home / Self Care | Payer: Medicare HMO | Source: Ambulatory Visit | Attending: Internal Medicine | Admitting: Internal Medicine

## 2018-06-20 ENCOUNTER — Emergency Department (HOSPITAL_COMMUNITY): Payer: Medicare HMO

## 2018-06-20 DIAGNOSIS — E876 Hypokalemia: Secondary | ICD-10-CM | POA: Diagnosis present

## 2018-06-20 DIAGNOSIS — M869 Osteomyelitis, unspecified: Secondary | ICD-10-CM | POA: Diagnosis present

## 2018-06-20 DIAGNOSIS — I10 Essential (primary) hypertension: Secondary | ICD-10-CM | POA: Diagnosis present

## 2018-06-20 DIAGNOSIS — Z7982 Long term (current) use of aspirin: Secondary | ICD-10-CM

## 2018-06-20 DIAGNOSIS — L97529 Non-pressure chronic ulcer of other part of left foot with unspecified severity: Secondary | ICD-10-CM | POA: Diagnosis present

## 2018-06-20 DIAGNOSIS — Z794 Long term (current) use of insulin: Secondary | ICD-10-CM

## 2018-06-20 DIAGNOSIS — E785 Hyperlipidemia, unspecified: Secondary | ICD-10-CM | POA: Diagnosis present

## 2018-06-20 DIAGNOSIS — L97403 Non-pressure chronic ulcer of unspecified heel and midfoot with necrosis of muscle: Secondary | ICD-10-CM | POA: Diagnosis not present

## 2018-06-20 DIAGNOSIS — E46 Unspecified protein-calorie malnutrition: Secondary | ICD-10-CM | POA: Diagnosis present

## 2018-06-20 DIAGNOSIS — E1169 Type 2 diabetes mellitus with other specified complication: Secondary | ICD-10-CM | POA: Diagnosis present

## 2018-06-20 DIAGNOSIS — E1142 Type 2 diabetes mellitus with diabetic polyneuropathy: Secondary | ICD-10-CM | POA: Diagnosis present

## 2018-06-20 DIAGNOSIS — E11621 Type 2 diabetes mellitus with foot ulcer: Secondary | ICD-10-CM | POA: Diagnosis present

## 2018-06-20 DIAGNOSIS — E440 Moderate protein-calorie malnutrition: Secondary | ICD-10-CM | POA: Diagnosis not present

## 2018-06-20 DIAGNOSIS — Z89411 Acquired absence of right great toe: Secondary | ICD-10-CM

## 2018-06-20 DIAGNOSIS — M86272 Subacute osteomyelitis, left ankle and foot: Secondary | ICD-10-CM

## 2018-06-20 DIAGNOSIS — L97509 Non-pressure chronic ulcer of other part of unspecified foot with unspecified severity: Secondary | ICD-10-CM | POA: Diagnosis not present

## 2018-06-20 DIAGNOSIS — E11628 Type 2 diabetes mellitus with other skin complications: Secondary | ICD-10-CM | POA: Diagnosis present

## 2018-06-20 DIAGNOSIS — F17210 Nicotine dependence, cigarettes, uncomplicated: Secondary | ICD-10-CM | POA: Diagnosis present

## 2018-06-20 DIAGNOSIS — K219 Gastro-esophageal reflux disease without esophagitis: Secondary | ICD-10-CM | POA: Diagnosis present

## 2018-06-20 DIAGNOSIS — L03116 Cellulitis of left lower limb: Secondary | ICD-10-CM | POA: Diagnosis present

## 2018-06-20 DIAGNOSIS — L97424 Non-pressure chronic ulcer of left heel and midfoot with necrosis of bone: Secondary | ICD-10-CM

## 2018-06-20 DIAGNOSIS — L089 Local infection of the skin and subcutaneous tissue, unspecified: Secondary | ICD-10-CM | POA: Diagnosis not present

## 2018-06-20 DIAGNOSIS — E119 Type 2 diabetes mellitus without complications: Secondary | ICD-10-CM | POA: Diagnosis not present

## 2018-06-20 DIAGNOSIS — L0390 Cellulitis, unspecified: Secondary | ICD-10-CM

## 2018-06-20 LAB — HEMOGLOBIN A1C
Hgb A1c MFr Bld: 6.6 % — ABNORMAL HIGH (ref 4.8–5.6)
Mean Plasma Glucose: 142.72 mg/dL

## 2018-06-20 LAB — BASIC METABOLIC PANEL
ANION GAP: 8 (ref 5–15)
BUN: 13 mg/dL (ref 6–20)
CHLORIDE: 104 mmol/L (ref 98–111)
CO2: 26 mmol/L (ref 22–32)
Calcium: 9 mg/dL (ref 8.9–10.3)
Creatinine, Ser: 0.83 mg/dL (ref 0.61–1.24)
GFR calc Af Amer: >60 mL/min (ref >60)
Glucose, Bld: 164 mg/dL — ABNORMAL HIGH (ref 70–99)
POTASSIUM: 3.8 mmol/L (ref 3.5–5.1)
SODIUM: 138 mmol/L (ref 135–145)

## 2018-06-20 LAB — CREATININE, SERUM: CREATININE: 0.86 mg/dL (ref 0.61–1.24)

## 2018-06-20 LAB — GLUCOSE, CAPILLARY
GLUCOSE-CAPILLARY: 95 mg/dL (ref 70–99)
Glucose-Capillary: 124 mg/dL — ABNORMAL HIGH (ref 70–99)

## 2018-06-20 LAB — CBC
HCT: 44.6 % (ref 39.0–52.0)
HEMOGLOBIN: 15.4 g/dL (ref 13.0–17.0)
MCH: 29.8 pg (ref 26.0–34.0)
MCHC: 34.5 g/dL (ref 30.0–36.0)
MCV: 86.4 fL (ref 78.0–100.0)
Platelets: 293 10*3/uL (ref 150–400)
RBC: 5.16 MIL/uL (ref 4.22–5.81)
RDW: 12.4 % (ref 11.5–15.5)
WBC: 9.3 10*3/uL (ref 4.0–10.5)

## 2018-06-20 LAB — CBC WITH DIFFERENTIAL/PLATELET
BASOS ABS: 0 10*3/uL (ref 0.0–0.1)
Basophils Relative: 0 %
Eosinophils Absolute: 0.2 10*3/uL (ref 0.0–0.7)
Eosinophils Relative: 2 %
HCT: 45.3 % (ref 39.0–52.0)
HEMOGLOBIN: 15.7 g/dL (ref 13.0–17.0)
LYMPHS ABS: 1.9 10*3/uL (ref 0.7–4.0)
LYMPHS PCT: 18 %
MCH: 29.8 pg (ref 26.0–34.0)
MCHC: 34.7 g/dL (ref 30.0–36.0)
MCV: 86.1 fL (ref 78.0–100.0)
Monocytes Absolute: 0.8 10*3/uL (ref 0.1–1.0)
Monocytes Relative: 8 %
NEUTROS ABS: 7.7 10*3/uL (ref 1.7–7.7)
Neutrophils Relative %: 72 %
Platelets: 261 10*3/uL (ref 150–400)
RBC: 5.26 MIL/uL (ref 4.22–5.81)
RDW: 12.5 % (ref 11.5–15.5)
WBC: 10.7 10*3/uL — AB (ref 4.0–10.5)

## 2018-06-20 LAB — CBG MONITORING, ED: GLUCOSE-CAPILLARY: 165 mg/dL — AB (ref 70–99)

## 2018-06-20 LAB — PREALBUMIN: PREALBUMIN: 14.3 mg/dL — AB (ref 18–38)

## 2018-06-20 LAB — C-REACTIVE PROTEIN: CRP: 7.3 mg/dL — AB (ref <1.0)

## 2018-06-20 LAB — SEDIMENTATION RATE: Sed Rate: 20 mm/hr — ABNORMAL HIGH (ref 0–16)

## 2018-06-20 IMAGING — CR DG FOOT COMPLETE 3+V*L*
3 series · 3 of 3 positions shown · non-contrast
Comparison: MR foot [DATE]

CLINICAL DATA: Foot ulcer

EXAM:
LEFT FOOT - COMPLETE 3+ VIEW

[x foot ap left]
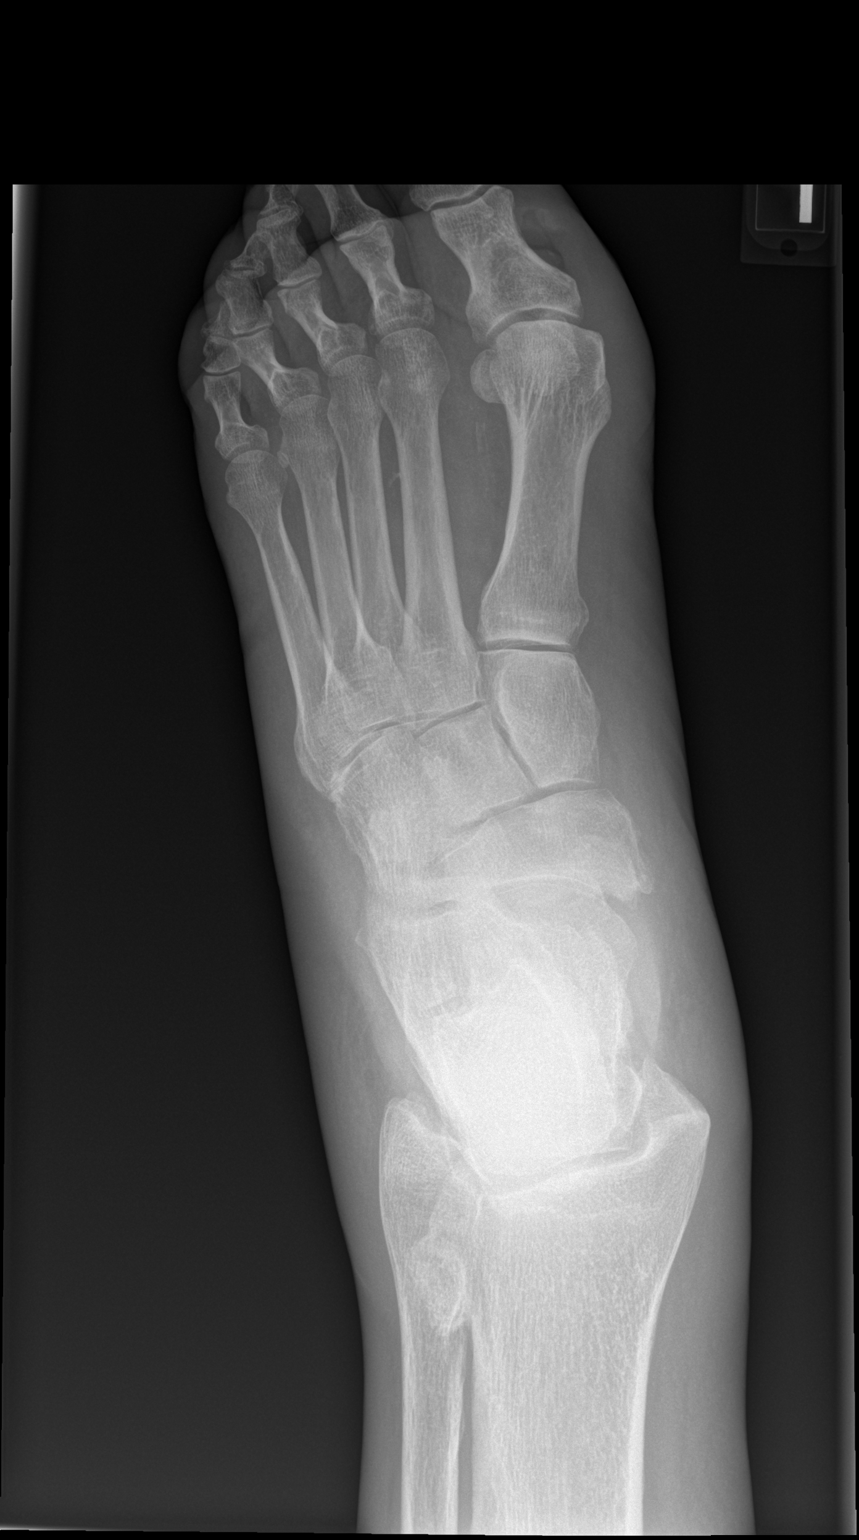

[x foot obl left]
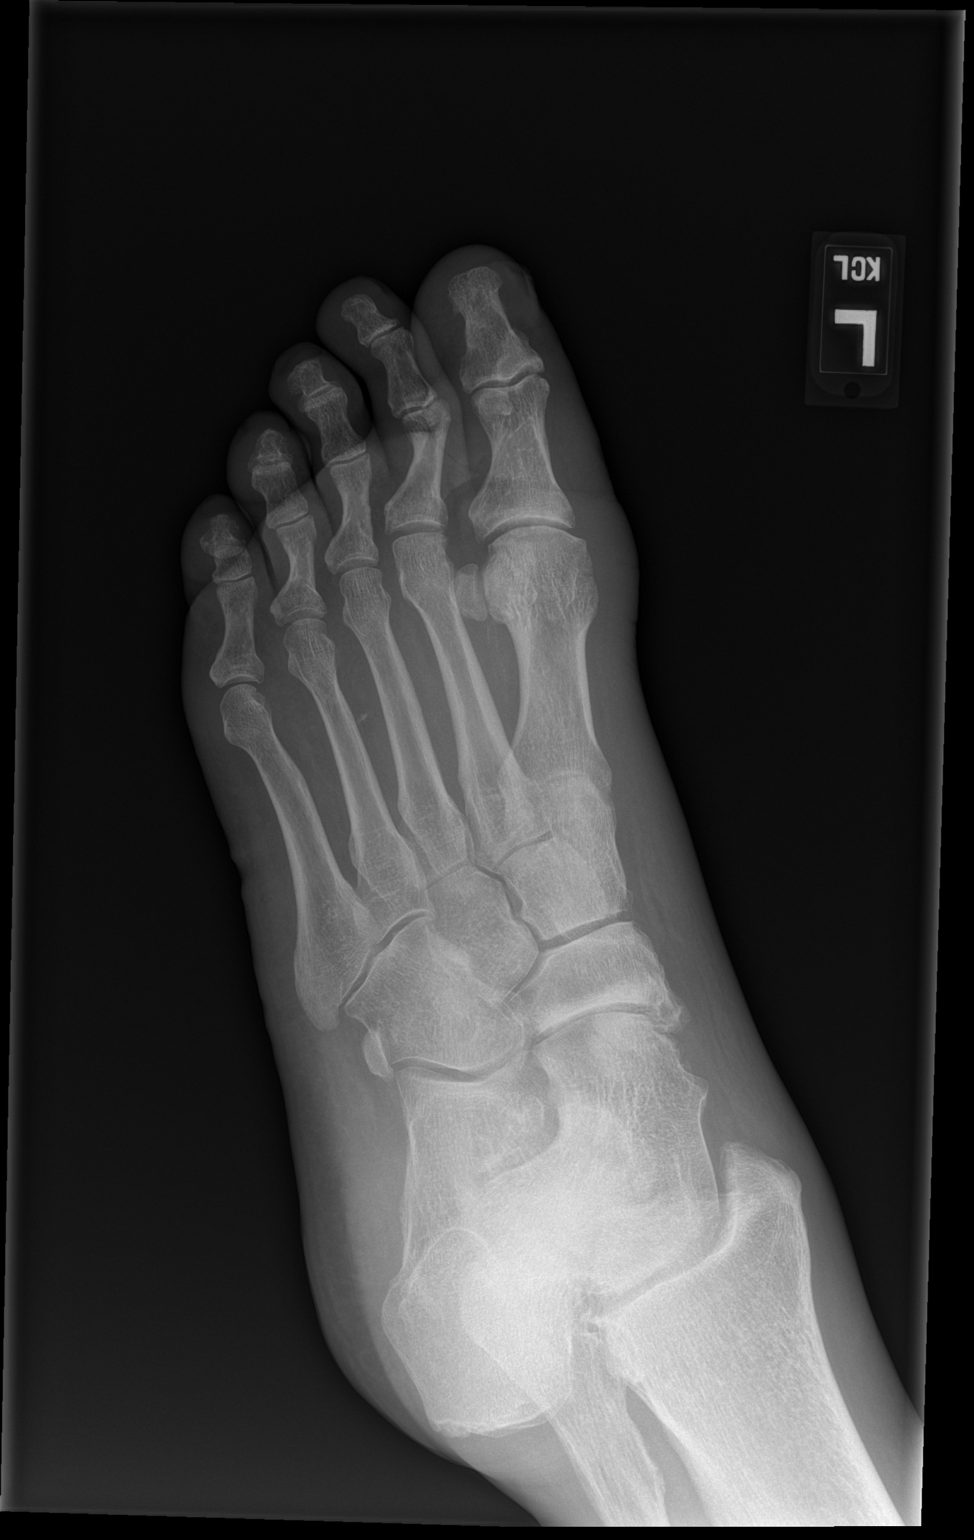

[x foot lat left]
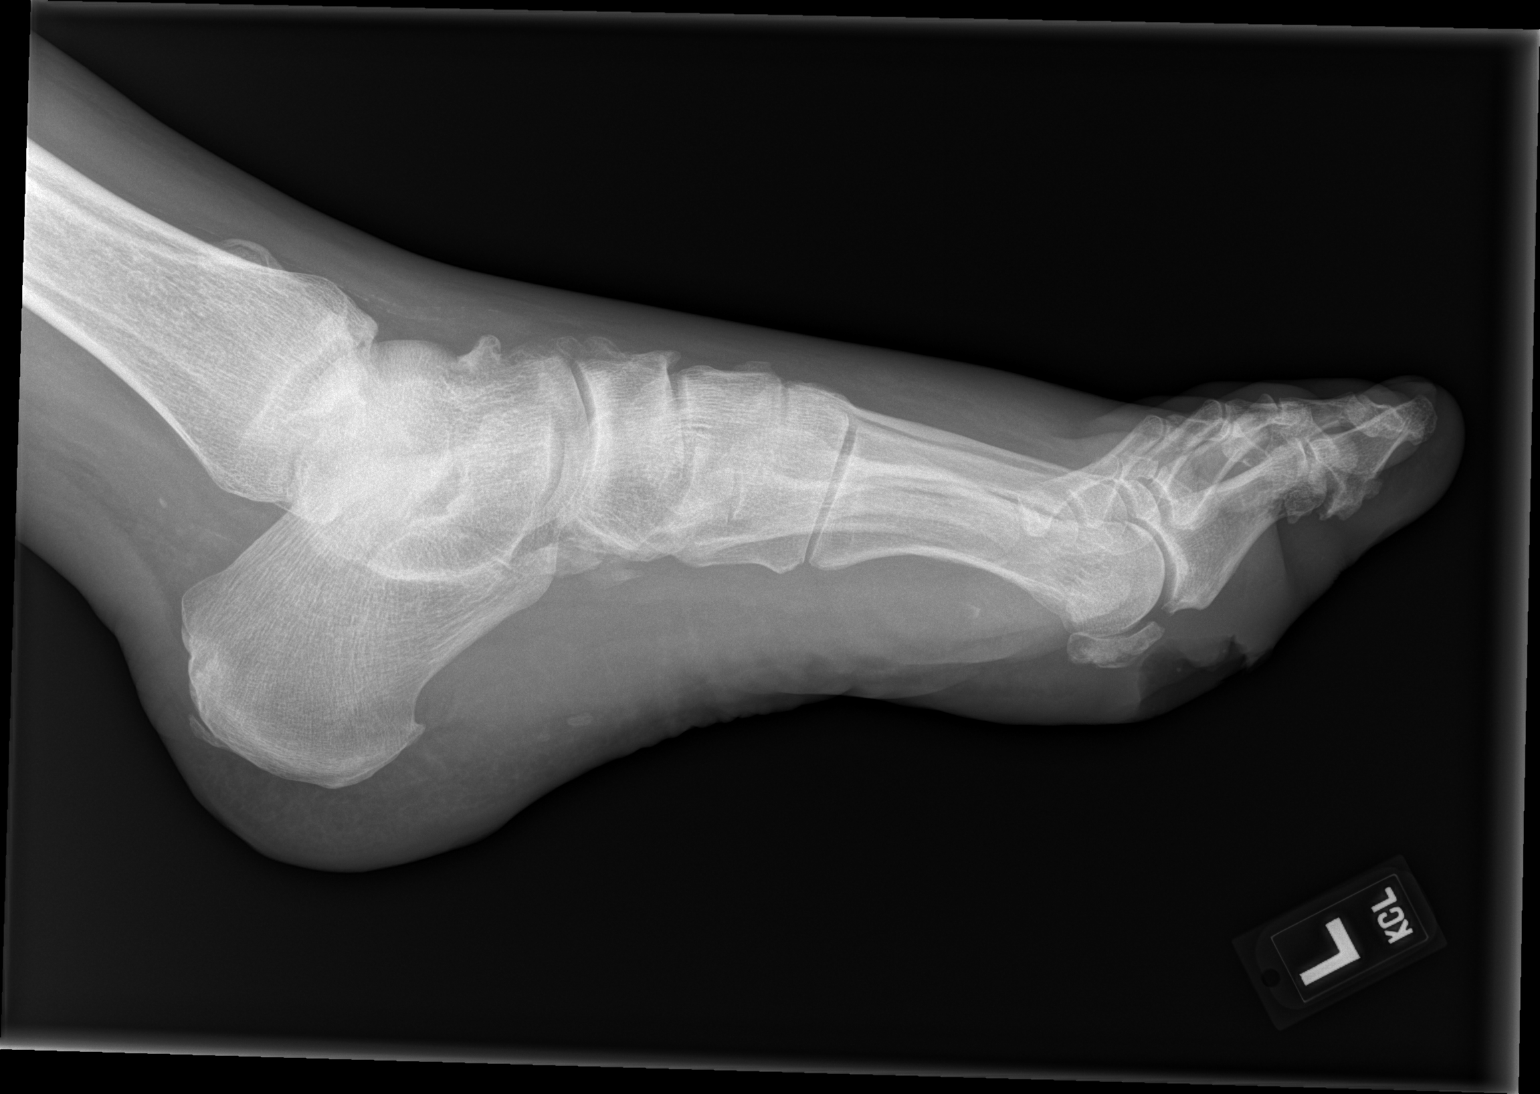

[3 of 3 positions shown; findings below may reference images not displayed]

FINDINGS: No acute fracture or dislocation. No aggressive osseous lesion. Soft
tissue wound overlying the plantar aspect of the first MTP joint.
Fragmentation of medial hallux sesamoid on the lateral view adjacent
to the soft tissue wound similar in appearance to the prior x-ray
dated [DATE]. No periosteal reaction or other areas of suspected
bone destruction.
IMPRESSION: 1. Soft tissue wound along the plantar aspect of the first MTP
joint. Fragmentation of medial hallux sesamoid on the lateral view
adjacent to the soft tissue wound similar in appearance to the prior
x-ray dated [DATE]. This likely reflects sesamoiditis versus
less likely osteomyelitis.

## 2018-06-20 MED ORDER — ACETAMINOPHEN 325 MG PO TABS: 650.0000 mg | ORAL_TABLET | Freq: Four times a day (QID) | ORAL | Status: DC | PRN

## 2018-06-20 MED ORDER — INSULIN GLARGINE 100 UNIT/ML ~~LOC~~ SOLN
40.0000 [IU] | Freq: Two times a day (BID) | SUBCUTANEOUS | Status: DC
  Administered 2018-06-20: 40 [IU] via SUBCUTANEOUS
  Filled 2018-06-20 (×2): qty 0.4

## 2018-06-20 MED ORDER — PIPERACILLIN-TAZOBACTAM 3.375 G IVPB 30 MIN
3.3750 g | Freq: Once | INTRAVENOUS | Status: AC
  Administered 2018-06-20: 3.375 g via INTRAVENOUS
  Filled 2018-06-20: qty 50

## 2018-06-20 MED ORDER — ATORVASTATIN CALCIUM 10 MG PO TABS
10.0000 mg | ORAL_TABLET | Freq: Every day | ORAL | Status: DC
  Administered 2018-06-21 – 2018-06-23 (×2): 10 mg via ORAL
  Filled 2018-06-20 (×2): qty 1

## 2018-06-20 MED ORDER — SODIUM CHLORIDE 0.9 % IV SOLN
2.0000 g | INTRAVENOUS | Status: DC
  Administered 2018-06-20 – 2018-06-21 (×2): 2 g via INTRAVENOUS
  Filled 2018-06-20: qty 2
  Filled 2018-06-20: qty 20
  Filled 2018-06-20: qty 2
  Filled 2018-06-20: qty 20

## 2018-06-20 MED ORDER — LACTATED RINGERS IV SOLN
INTRAVENOUS | Status: DC
  Administered 2018-06-20: 13:00:00 via INTRAVENOUS

## 2018-06-20 MED ORDER — ACETAMINOPHEN 650 MG RE SUPP: 650.0000 mg | Freq: Four times a day (QID) | RECTAL | Status: DC | PRN

## 2018-06-20 MED ORDER — INSULIN ASPART 100 UNIT/ML ~~LOC~~ SOLN
0.0000 [IU] | Freq: Three times a day (TID) | SUBCUTANEOUS | Status: DC
  Administered 2018-06-23 (×2): 1 [IU] via SUBCUTANEOUS

## 2018-06-20 MED ORDER — ONDANSETRON HCL 4 MG/2ML IJ SOLN
4.0000 mg | Freq: Four times a day (QID) | INTRAMUSCULAR | Status: DC | PRN
  Administered 2018-06-20: 4 mg via INTRAVENOUS
  Filled 2018-06-20 (×2): qty 2

## 2018-06-20 MED ORDER — INSULIN ASPART 100 UNIT/ML ~~LOC~~ SOLN: 0.0000 [IU] | Freq: Every day | SUBCUTANEOUS | Status: DC

## 2018-06-20 MED ORDER — SODIUM CHLORIDE 0.9 % IV SOLN
1250.0000 mg | Freq: Two times a day (BID) | INTRAVENOUS | Status: DC
  Administered 2018-06-21 – 2018-06-23 (×4): 1250 mg via INTRAVENOUS
  Filled 2018-06-20 (×7): qty 1250

## 2018-06-20 MED ORDER — VANCOMYCIN HCL IN DEXTROSE 1-5 GM/200ML-% IV SOLN: 1000.0000 mg | Freq: Once | INTRAVENOUS | Status: DC

## 2018-06-20 MED ORDER — POLYETHYLENE GLYCOL 3350 17 G PO PACK: 17.0000 g | PACK | Freq: Every day | ORAL | Status: DC | PRN

## 2018-06-20 MED ORDER — AMLODIPINE BESYLATE 10 MG PO TABS
10.0000 mg | ORAL_TABLET | Freq: Every day | ORAL | Status: DC
  Administered 2018-06-21 – 2018-06-23 (×2): 10 mg via ORAL
  Filled 2018-06-20 (×2): qty 1

## 2018-06-20 MED ORDER — ZOLPIDEM TARTRATE 5 MG PO TABS
5.0000 mg | ORAL_TABLET | Freq: Every evening | ORAL | Status: DC | PRN
  Administered 2018-06-20 – 2018-06-22 (×3): 5 mg via ORAL
  Filled 2018-06-20 (×3): qty 1

## 2018-06-20 MED ORDER — SODIUM CHLORIDE 0.9 % IV SOLN
2000.0000 mg | Freq: Once | INTRAVENOUS | Status: AC
  Administered 2018-06-20: 2000 mg via INTRAVENOUS
  Filled 2018-06-20: qty 2000

## 2018-06-20 MED ORDER — ASPIRIN EC 81 MG PO TBEC
81.0000 mg | DELAYED_RELEASE_TABLET | Freq: Every day | ORAL | Status: DC
  Administered 2018-06-21 – 2018-06-23 (×2): 81 mg via ORAL
  Filled 2018-06-20 (×2): qty 1

## 2018-06-20 MED ORDER — METRONIDAZOLE 500 MG PO TABS
500.0000 mg | ORAL_TABLET | Freq: Three times a day (TID) | ORAL | Status: DC
  Administered 2018-06-20 – 2018-06-23 (×9): 500 mg via ORAL
  Filled 2018-06-20 (×9): qty 1

## 2018-06-20 MED ORDER — PANTOPRAZOLE SODIUM 40 MG PO TBEC
40.0000 mg | DELAYED_RELEASE_TABLET | Freq: Every day | ORAL | Status: DC
  Administered 2018-06-21 – 2018-06-23 (×2): 40 mg via ORAL
  Filled 2018-06-20 (×2): qty 1

## 2018-06-20 MED ORDER — NICOTINE 21 MG/24HR TD PT24
21.0000 mg | MEDICATED_PATCH | Freq: Every day | TRANSDERMAL | Status: DC
  Administered 2018-06-20 – 2018-06-23 (×5): 21 mg via TRANSDERMAL
  Filled 2018-06-20 (×6): qty 1

## 2018-06-20 MED ORDER — SODIUM CHLORIDE 0.9 % IV SOLN
INTRAVENOUS | Status: AC
  Administered 2018-06-20 (×2): via INTRAVENOUS

## 2018-06-20 MED ORDER — ENOXAPARIN SODIUM 60 MG/0.6ML ~~LOC~~ SOLN
50.0000 mg | SUBCUTANEOUS | Status: DC
  Administered 2018-06-20 – 2018-06-22 (×3): 50 mg via SUBCUTANEOUS
  Filled 2018-06-20 (×3): qty 0.6

## 2018-06-20 MED ORDER — NICOTINE 14 MG/24HR TD PT24
14.0000 mg | MEDICATED_PATCH | Freq: Once | TRANSDERMAL | Status: DC
  Administered 2018-06-20: 14 mg via TRANSDERMAL
  Filled 2018-06-20: qty 1

## 2018-06-20 NOTE — ED Triage Notes (Addendum)
Pt sent by podiatry For ulcer on bottom of left foot. Pt states that it has a "odor like death". Pt ambulatory to triage. Pt states that he has been being followed for this issue for 8 months.

## 2018-06-20 NOTE — ED Provider Notes (Addendum)
[facility]-EMERGENCY DEPT Provider Note   CSN: [id number] Arrival date & time: 06/20/18  0944     History   Chief Complaint Chief Complaint  Patient presents with  . Skin Ulcer    HPI Charles Thompson is a 57 y.o. male who presents with a left foot wound.  Past medical history significant for insulin-dependent diabetes with neuropathy, hypertension, hyperlipidemia.  The patient has been followed by podiatry.  He states that for approximally the past 8 months he has had a gradually worsening ulcer over the plantar aspect of his left foot under his great toe.  He has had several x-rays of the foot and an MRI which was done in Dec 2018. He has been on several rounds of antibiotics (none recently) however the wound has gradually continued to worsen.  He states that over the past week it has rapidly worsened.  He reports redness, swelling of the entire foot and left leg, odor and drainage from the wound.  He called his podiatrist office several days ago and they advised him to come to the ED however he did not want to.  He went for an appointment this morning and the podiatrist strongly recommended him to come to the ED for antibiotics, debridement, and admission.  No fever but he has had chills.  He is status post amputation of the right great toe and his symptoms leading up to that were similar to the current one.  HPI  Past Medical History:  Diagnosis Date  . Diabetes mellitus without complication (HCC)   . Hypertension   . Peripheral neuropathy     There are no active problems to display for this patient.   Past Surgical History:  Procedure Laterality Date  . EYE SURGERY          Home Medications    Prior to Admission medications   Medication Sig Start Date End Date Taking? Authorizing Provider  amLODipine (NORVASC) 10 MG tablet Take 10 mg by mouth daily. 04/04/15   [provider]  aspirin 81 MG tablet Take 81 mg by mouth daily.    [provider]  atorvastatin (LIPITOR) 10 MG tablet Take 10 mg by mouth daily. 04/04/15   [provider]  cephALEXin (KEFLEX) 500 MG capsule Take 1 capsule (500 mg total) by mouth 2 (two) times daily. 04/17/15   Tomasita Crumble, MD  doxycycline (VIBRA-TABS) 100 MG tablet Take 1 tablet (100 mg total) by mouth 2 (two) times daily. 04/17/15   Tomasita Crumble, MD  INVOKANA 300 MG TABS tablet Take 300 mg by mouth daily. 04/04/15   [provider]  KOMBIGLYZE XR 2.03-999 MG TB24 Take 1 tablet by mouth 2 (two) times daily. 04/04/15   [provider]  omeprazole (PRILOSEC) 20 MG capsule Take 20 mg by mouth daily as needed (heartburn).  02/08/15   [provider]  TOUJEO SOLOSTAR 300 UNIT/ML SOPN Inject 65 Units into the skin 2 (two) times daily. 03/25/15   [provider]    Family History No family history on file.  Social History Social History   Tobacco Use  . Smoking status: Current Some Day Smoker    Types: Cigarettes  . Smokeless tobacco: Never Used  Substance Use Topics  . Alcohol use: No    Alcohol/week: 0.0 oz  . Drug use: Never     Allergies   Patient has no known allergies.   Review of Systems Review of Systems  Constitutional: Positive for chills. Negative  for fever.  Respiratory: Negative for shortness of breath.   Cardiovascular: Positive for leg swelling. Negative for chest pain.  Musculoskeletal: Positive for arthralgias.  Skin: Positive for color change and wound.  All other systems reviewed and are negative.    Physical Exam Updated Vital Signs BP (!) 158/85 (BP Location: Left Arm)   Pulse (!) 105   Temp 98 F (36.7 C) (Oral)   Resp 16   Ht 6' (1.829 m)   Wt 113.4 kg (250 lb)   SpO2 99%   BMI 33.91 kg/m   Physical Exam  Constitutional: He is oriented to person, place, and time. He appears well-developed and well-nourished. No distress.  Calm and cooperative  HENT:  Head: Normocephalic and atraumatic.  Eyes: Pupils are  equal, round, and reactive to light. Conjunctivae are normal. Right eye exhibits no discharge. Left eye exhibits no discharge. No scleral icterus.  Neck: Normal range of motion.  Cardiovascular: Normal rate and regular rhythm.  Pulmonary/Chest: Effort normal and breath sounds normal. No respiratory distress.  Abdominal: He exhibits no distension.  Musculoskeletal:  Left foot: Diffuse, moderate 2+ pitting edema of the left lower extremity from the foot to knee. There is a 2.5 x 2.0 x 0.5 cm ulceration which is malodorous and draining over the plantar aspect of the foot over the 1st MTP joint. There is surrounding erythema over the entire foot. No tenderness (due to neuropathy). Faint DP pulse.    Neurological: He is alert and oriented to person, place, and time.  Skin: Skin is warm and dry.  Psychiatric: He has a normal mood and affect. His behavior is normal.  Nursing note and vitals reviewed.        ED Treatments / Results  Labs (all labs ordered are listed, but only abnormal results are displayed) Labs Reviewed  BASIC METABOLIC PANEL - Abnormal; Notable for the following components:      Result Value   Glucose, Bld 164 (*)    All other components within normal limits  CBC WITH DIFFERENTIAL/PLATELET - Abnormal; Notable for the following components:   WBC 10.7 (*)    All other components within normal limits  CBG MONITORING, ED - Abnormal; Notable for the following components:   Glucose-Capillary 165 (*)    All other components within normal limits  CULTURE, BLOOD (ROUTINE X 2)  CULTURE, BLOOD (ROUTINE X 2)  CBC  HEMOGLOBIN A1C  HIV ANTIBODY (ROUTINE TESTING)  SEDIMENTATION RATE  C-REACTIVE PROTEIN  PREALBUMIN  CREATININE, SERUM    EKG None  Radiology Dg Foot Complete Left  Result Date: 06/20/2018 CLINICAL DATA:  Foot ulcer EXAM: LEFT FOOT - COMPLETE 3+ VIEW COMPARISON:  MR foot 10/20/2017 FINDINGS: No acute fracture or dislocation. No aggressive osseous lesion. Soft  tissue wound overlying the plantar aspect of the first MTP joint. Fragmentation of medial hallux sesamoid on the lateral view adjacent to the soft tissue wound similar in appearance to the prior x-ray dated 08/23/2017. No periosteal reaction or other areas of suspected bone destruction. IMPRESSION: 1. Soft tissue wound along the plantar aspect of the first MTP joint. Fragmentation of medial hallux sesamoid on the lateral view adjacent to the soft tissue wound similar in appearance to the prior x-ray dated 08/23/2017. This likely reflects sesamoiditis versus less likely osteomyelitis. Electronically Signed   By: Elige KoHetal  Patel   On: 06/20/2018 10:52    Procedures Procedures (including critical care time)  Medications Ordered in ED Medications  lactated ringers infusion ( Intravenous Stopped 06/20/18  1457)  amLODipine (NORVASC) tablet 10 mg (has no administration in time range)  aspirin EC tablet 81 mg (has no administration in time range)  atorvastatin (LIPITOR) tablet 10 mg (has no administration in time range)  pantoprazole (PROTONIX) EC tablet 40 mg (has no administration in time range)  cefTRIAXone (ROCEPHIN) 2 g in sodium chloride 0.9 % 100 mL IVPB ( Intravenous Rate/Dose Verify 06/20/18 1500)  metroNIDAZOLE (FLAGYL) tablet 500 mg (500 mg Oral Given 06/20/18 1454)  enoxaparin (LOVENOX) injection 50 mg (has no administration in time range)  acetaminophen (TYLENOL) tablet 650 mg (has no administration in time range)    Or  acetaminophen (TYLENOL) suppository 650 mg (has no administration in time range)  polyethylene glycol (MIRALAX / GLYCOLAX) packet 17 g (has no administration in time range)  insulin glargine (LANTUS) injection 40 Units (has no administration in time range)  vancomycin (VANCOCIN) 2,000 mg in sodium chloride 0.9 % 500 mL IVPB ( Intravenous Rate/Dose Verify 06/20/18 1500)  vancomycin (VANCOCIN) 1,250 mg in sodium chloride 0.9 % 250 mL IVPB (has no administration in time range)  insulin  aspart (novoLOG) injection 0-9 Units (has no administration in time range)  insulin aspart (novoLOG) injection 0-5 Units (has no administration in time range)  nicotine (NICODERM CQ - dosed in mg/24 hours) patch 21 mg (21 mg Transdermal Patch Applied 06/20/18 1454)  piperacillin-tazobactam (ZOSYN) IVPB 3.375 g (0 g Intravenous Stopped 06/20/18 1408)     Initial Impression / Assessment and Plan / ED Course  I have reviewed the triage vital signs and the nursing notes.  Pertinent labs & imaging results that were available during my care of the patient were reviewed by me and considered in my medical decision making (see chart for details).  57 year old male presents with cellulitis from his non-healing diabetic ulcer. He is hypertensive but otherwise vitals are normal. He has worrisome signs of infection on exam with redness and swelling and a oozing, malodorous wound. Will order xray, labs and reassess  Labs are overall unremarkable. Xray shows sesamoiditis vs less likely osteomyelitis. IV abx were ordered. Discussed admission with the patient who is somewhat unhappy but willing to stay until he improves. Discussed with Dr. Lowell Guitar with [facility] who will admit.  Final Clinical Impressions(s) / ED Diagnoses   Final diagnoses:  Cellulitis of left lower extremity  Diabetic ulcer of left foot associated with type 2 diabetes mellitus, unspecified part of foot, unspecified ulcer stage [facility])    ED Discharge Orders    None       Bethel Born, PA-C 06/20/18 1512    Bethel Born, PA-C 06/20/18 1513    Mancel Bale, MD 06/20/18 1544

## 2018-06-20 NOTE — Consult Note (Signed)
  ORTHOPAEDIC CONSULTATION  REQUESTING PHYSICIAN: Powell, A Caldwell Jr., *  Chief Complaint: Chronic ulceration left great toe.  HPI: Charles Thompson is a 56 y.o. male who presents with osteomyelitis ulceration and purulent drainage left great toe.  Patient states he has been under the care of podiatry for 8 months recently has seen a wound care center.  Patient complains of progressive cellulitis and progressive drainage and bleeding.  Patient states that his hemoglobin A1c in the past is gone up to as high as 13.  Patient states that he still smokes.  Patient is currently eating a hot dog French fries and drinking a Mountain Dew.  Past Medical History:  Diagnosis Date  . Diabetes mellitus without complication (HCC)   . Hypertension   . Peripheral neuropathy    Past Surgical History:  Procedure Laterality Date  . EYE SURGERY     Social History   Socioeconomic History  . Marital status: Single    Spouse name: Not on file  . Number of children: Not on file  . Years of education: Not on file  . Highest education level: Not on file  Occupational History  . Not on file  Social Needs  . Financial resource strain: Not on file  . Food insecurity:    Worry: Not on file    Inability: Not on file  . Transportation needs:    Medical: Not on file    Non-medical: Not on file  Tobacco Use  . Smoking status: Current Some Day Smoker    Types: Cigarettes  . Smokeless tobacco: Never Used  Substance and Sexual Activity  . Alcohol use: No    Alcohol/week: 0.0 oz  . Drug use: Never  . Sexual activity: Not on file  Lifestyle  . Physical activity:    Days per week: Not on file    Minutes per session: Not on file  . Stress: Not on file  Relationships  . Social connections:    Talks on phone: Not on file    Gets together: Not on file    Attends religious service: Not on file    Active member of club or organization: Not on file    Attends meetings of clubs or organizations: Not  on file    Relationship status: Not on file  Other Topics Concern  . Not on file  Social History Narrative  . Not on file   No family history on file. - negative except otherwise stated in the family history section No Known Allergies Prior to Admission medications   Medication Sig Start Date End Date Taking? Authorizing Provider  amLODipine (NORVASC) 10 MG tablet Take 10 mg by mouth daily. 04/04/15  Yes [provider]  aspirin 81 MG tablet Take 81 mg by mouth daily.   Yes [provider]  atorvastatin (LIPITOR) 10 MG tablet Take 10 mg by mouth daily. 04/04/15  Yes [provider]  gabapentin (NEURONTIN) 300 MG capsule Take 300 mg by mouth 3 (three) times daily. 10/08/15  Yes [provider]  gentamicin cream (GARAMYCIN) 0.1 % APPLY EXTERNALLY TO THE AFFECTED AREA 3 TIMES A WEEK 03/09/18  Yes [provider]  lisinopril-hydrochlorothiazide (PRINZIDE,ZESTORETIC) 20-12.5 MG tablet Take 1 tablet by mouth daily. 06/17/18  Yes [provider]  omeprazole (PRILOSEC) 20 MG capsule Take 20 mg by mouth daily as needed (heartburn).  02/08/15  Yes [provider]  SYNJARDY 12.03-999 MG TABS Take 1 tablet by mouth daily. 06/17/18  Yes [provider]  TOUJEO SOLOSTAR 300 UNIT/ML SOPN Inject 65 Units into the skin 2 (two) times daily. 03/25/15  Yes [provider]  INVOKANA 300 MG TABS tablet Take 300 mg by mouth daily. 04/04/15   [provider]  KOMBIGLYZE XR 2.03-999 MG TB24 Take 1 tablet by mouth 2 (two) times daily. 04/04/15   [provider]  lisinopril (PRINIVIL,ZESTRIL) 20 MG tablet Take 20 mg by mouth daily.    [provider]   Dg Foot Complete Left  Result Date: 06/20/2018 CLINICAL DATA:  Foot ulcer EXAM: LEFT FOOT - COMPLETE 3+ VIEW COMPARISON:  MR foot 10/20/2017 FINDINGS: No acute fracture or dislocation. No aggressive osseous lesion. Soft tissue wound overlying the plantar aspect of the first  MTP joint. Fragmentation of medial hallux sesamoid on the lateral view adjacent to the soft tissue wound similar in appearance to the prior x-ray dated 08/23/2017. No periosteal reaction or other areas of suspected bone destruction. IMPRESSION: 1. Soft tissue wound along the plantar aspect of the first MTP joint. Fragmentation of medial hallux sesamoid on the lateral view adjacent to the soft tissue wound similar in appearance to the prior x-ray dated 08/23/2017. This likely reflects sesamoiditis versus less likely osteomyelitis. Electronically Signed   By: Hetal  Patel   On: 06/20/2018 10:52   - pertinent xrays, CT, MRI studies were reviewed and independently interpreted  Positive ROS: All other systems have been reviewed and were otherwise negative with the exception of those mentioned in the HPI and as above.  Physical Exam: General: Alert, no acute distress Psychiatric: Patient is competent for consent with normal mood and affect Lymphatic: No axillary or cervical lymphadenopathy Cardiovascular: No pedal edema Respiratory: No cyanosis, no use of accessory musculature GI: No organomegaly, abdomen is soft and non-tender    Images:  @ENCIMAGES@  Labs:  Lab Results  Component Value Date   HGBA1C 6.6 (H) 06/20/2018   ESRSEDRATE 20 (H) 06/20/2018   CRP 7.3 (H) 06/20/2018    Lab Results  Component Value Date   ALBUMIN 4.4 04/17/2015   ALBUMIN 4.4 12/18/2007   PREALBUMIN 14.3 (L) 06/20/2018    Neurologic: Patient does not have protective sensation bilateral lower extremities.   MUSCULOSKELETAL:   Skin: On examination patient has cellulitis to the midfoot.  He has an ulcer beneath the first metatarsal head left foot.  The ulcer probes to destructive bony changes.  Radiographs shows destruction of the sesamoid left great toe.  Patient has a strong dorsalis pedis and posterior tibial pulse.  There is purulent drainage from the ulcer.  Patient has pitting edema of the  leg.  Patient has a low pre-albumin a current albumin level is not available.  Assessment: Assessment: Diabetic insensate neuropathy with tobacco use and protein caloric malnutrition with osteomyelitis ulceration cellulitis and purulent drainage from the left great toe MTP joint.  Plan: Plan: We will plan for a left foot first ray amputation.  Discussed with the patient the importance of being on IV antibiotics prior to surgery that I recommend against being discharged and that we should proceed with 48 hours of IV antibiotics prior to surgery.  Patient will need to be transferred to [facility] for surgery on Wednesday.  Anticipate patient could be discharged on Thursday.  Risks and benefits were discussed including risk of transtibial amputation and that postoperative  patient is to be strict with nonweightbearing on the left, non-smoking and proper nutrition.  Thank you for the consult and the opportunity to see   Charles Thompson  Charles Cadden, MD [facility] [phone] 6:38 PM     

## 2018-06-20 NOTE — Progress Notes (Signed)
A consult was received from an ED physician for vanc/Zosyn per pharmacy dosing.  The patient's profile has been reviewed for ht/wt/allergies/indication/available labs.   A one time order has been placed for vanc 1g and Zosyn 3.375g.  Further antibiotics/pharmacy consults should be ordered by admitting physician if indicated.                       Thank you, Berkley HarveyLegge, Rosita Guzzetta Marshall 06/20/2018  12:17 PM

## 2018-06-20 NOTE — Progress Notes (Signed)
Refused MR left foot as per radiology tech

## 2018-06-20 NOTE — Progress Notes (Signed)
Pharmacy Antibiotic Note  Charles Thompson is a 57 y.o. male admitted on 06/20/2018 with DM foot infection/cellulitis.  Pharmacy has been consulted for vancomycin dosing.  Plan: 1) Vancomycin 2g x 1 then 1250mg  IV q12 - goal AUC 400-500 2) Zosyn ordered x 1 so far - was this intended to be continued?   Height: 6' (182.9 cm) Weight: 250 lb (113.4 kg) IBW/kg (Calculated) : 77.6  Temp (24hrs), Avg:98 F (36.7 C), Min:98 F (36.7 C), Max:98 F (36.7 C)  Recent Labs  Lab 06/20/18 1104  WBC 10.7*  CREATININE 0.83    Estimated Creatinine Clearance: 127.6 mL/min (by C-G formula based on SCr of 0.83 mg/dL).    No Known Allergies    Thank you for allowing pharmacy to be a part of this patient's care.   Hessie KnowsJustin M Opal Dinning, PharmD, BCPS Pager [phone] 06/20/2018 12:40 PM;

## 2018-06-20 NOTE — ED Notes (Signed)
First set of cultures obtained from right hand. blue top, red top.

## 2018-06-20 NOTE — H&P (View-Only) (Signed)
ORTHOPAEDIC CONSULTATION  REQUESTING PHYSICIAN: Zigmund Daniel., *  Chief Complaint: Chronic ulceration left great toe.  HPI: Charles Thompson is a 57 y.o. male who presents with osteomyelitis ulceration and purulent drainage left great toe.  Patient states he has been under the care of podiatry for 8 months recently has seen a wound care center.  Patient complains of progressive cellulitis and progressive drainage and bleeding.  Patient states that his hemoglobin A1c in the past is gone up to as high as 13.  Patient states that he still smokes.  Patient is currently eating a hot dog [place] fries and drinking a [employer].  Past Medical History:  Diagnosis Date  . Diabetes mellitus without complication (HCC)   . Hypertension   . Peripheral neuropathy    Past Surgical History:  Procedure Laterality Date  . EYE SURGERY     Social History   Socioeconomic History  . Marital status: Single    Spouse name: Not on file  . Number of children: Not on file  . Years of education: Not on file  . Highest education level: Not on file  Occupational History  . Not on file  Social Needs  . Financial resource strain: Not on file  . Food insecurity:    Worry: Not on file    Inability: Not on file  . Transportation needs:    Medical: Not on file    Non-medical: Not on file  Tobacco Use  . Smoking status: Current Some Day Smoker    Types: Cigarettes  . Smokeless tobacco: Never Used  Substance and Sexual Activity  . Alcohol use: No    Alcohol/week: 0.0 oz  . Drug use: Never  . Sexual activity: Not on file  Lifestyle  . Physical activity:    Days per week: Not on file    Minutes per session: Not on file  . Stress: Not on file  Relationships  . Social connections:    Talks on phone: Not on file    Gets together: Not on file    Attends religious service: Not on file    Active member of club or organization: Not on file    Attends meetings of clubs or organizations: Not  on file    Relationship status: Not on file  Other Topics Concern  . Not on file  Social History Narrative  . Not on file   No family history on file. - negative except otherwise stated in the family history section No Known Allergies Prior to Admission medications   Medication Sig Start Date End Date Taking? Authorizing Provider  amLODipine (NORVASC) 10 MG tablet Take 10 mg by mouth daily. 04/04/15  Yes [provider]  aspirin 81 MG tablet Take 81 mg by mouth daily.   Yes [provider]  atorvastatin (LIPITOR) 10 MG tablet Take 10 mg by mouth daily. 04/04/15  Yes [provider]  gabapentin (NEURONTIN) 300 MG capsule Take 300 mg by mouth 3 (three) times daily. 10/08/15  Yes [provider]  gentamicin cream (GARAMYCIN) 0.1 % APPLY EXTERNALLY TO THE AFFECTED AREA 3 TIMES A WEEK 03/09/18  Yes [provider]  lisinopril-hydrochlorothiazide (PRINZIDE,ZESTORETIC) 20-12.5 MG tablet Take 1 tablet by mouth daily. 06/17/18  Yes [provider]  omeprazole (PRILOSEC) 20 MG capsule Take 20 mg by mouth daily as needed (heartburn).  02/08/15  Yes [provider]  SYNJARDY 12.03-999 MG TABS Take 1 tablet by mouth daily. 06/17/18  Yes [provider]  TOUJEO SOLOSTAR 300 UNIT/ML SOPN Inject 65 Units into the skin 2 (two) times daily. 03/25/15  Yes [provider]  INVOKANA 300 MG TABS tablet Take 300 mg by mouth daily. 04/04/15   [provider]  KOMBIGLYZE XR 2.03-999 MG TB24 Take 1 tablet by mouth 2 (two) times daily. 04/04/15   [provider]  lisinopril (PRINIVIL,ZESTRIL) 20 MG tablet Take 20 mg by mouth daily.    [provider]   Dg Foot Complete Left  Result Date: 06/20/2018 CLINICAL DATA:  Foot ulcer EXAM: LEFT FOOT - COMPLETE 3+ VIEW COMPARISON:  MR foot 10/20/2017 FINDINGS: No acute fracture or dislocation. No aggressive osseous lesion. Soft tissue wound overlying the plantar aspect of the first  MTP joint. Fragmentation of medial hallux sesamoid on the lateral view adjacent to the soft tissue wound similar in appearance to the prior x-ray dated 08/23/2017. No periosteal reaction or other areas of suspected bone destruction. IMPRESSION: 1. Soft tissue wound along the plantar aspect of the first MTP joint. Fragmentation of medial hallux sesamoid on the lateral view adjacent to the soft tissue wound similar in appearance to the prior x-ray dated 08/23/2017. This likely reflects sesamoiditis versus less likely osteomyelitis. Electronically Signed   By: Elige Ko   On: 06/20/2018 10:52   - pertinent xrays, CT, MRI studies were reviewed and independently interpreted  Positive ROS: All other systems have been reviewed and were otherwise negative with the exception of those mentioned in the HPI and as above.  Physical Exam: General: Alert, no acute distress Psychiatric: Patient is competent for consent with normal mood and affect Lymphatic: No axillary or cervical lymphadenopathy Cardiovascular: No pedal edema Respiratory: No cyanosis, no use of accessory musculature GI: No organomegaly, abdomen is soft and non-tender    Images:  @ENCIMAGES @  Labs:  Lab Results  Component Value Date   HGBA1C 6.6 (H) 06/20/2018   ESRSEDRATE 20 (H) 06/20/2018   CRP 7.3 (H) 06/20/2018    Lab Results  Component Value Date   ALBUMIN 4.4 04/17/2015   ALBUMIN 4.4 12/18/2007   PREALBUMIN 14.3 (L) 06/20/2018    Neurologic: Patient does not have protective sensation bilateral lower extremities.   MUSCULOSKELETAL:   Skin: On examination patient has cellulitis to the midfoot.  He has an ulcer beneath the first metatarsal head left foot.  The ulcer probes to destructive bony changes.  Radiographs shows destruction of the sesamoid left great toe.  Patient has a strong dorsalis pedis and posterior tibial pulse.  There is purulent drainage from the ulcer.  Patient has pitting edema of the  leg.  Patient has a low pre-albumin a current albumin level is not available.  Assessment: Assessment: Diabetic insensate neuropathy with tobacco use and protein caloric malnutrition with osteomyelitis ulceration cellulitis and purulent drainage from the left great toe MTP joint.  Plan: Plan: We will plan for a left foot first ray amputation.  Discussed with the patient the importance of being on IV antibiotics prior to surgery that I recommend against being discharged and that we should proceed with 48 hours of IV antibiotics prior to surgery.  Patient will need to be transferred to [facility] for surgery on Wednesday.  Anticipate patient could be discharged on Thursday.  Risks and benefits were discussed including risk of transtibial amputation and that postoperative  patient is to be strict with nonweightbearing on the left, non-smoking and proper nutrition.  Thank you for the consult and the opportunity to see  Mr. Junita Pushhomas  Yisell Sprunger, MD [facility] [phone] 6:38 PM

## 2018-06-20 NOTE — ED Notes (Signed)
2 sets of blood cultures taken before antibiotics given.

## 2018-06-20 NOTE — ED Notes (Signed)
Wound on bottom of foot measuring 2.5in long x 2in wide. 0.5cm deep.

## 2018-06-20 NOTE — Progress Notes (Signed)
ABI's have been completed. Right 1.15 Left 1.07  06/20/18 3:48 PM Olen CordialGreg Kimberlin Scheel Dowe RVT

## 2018-06-20 NOTE — ED Notes (Signed)
Patient transported to X-ray 

## 2018-06-20 NOTE — Progress Notes (Signed)
CSW aware of consult for assistance with medication. CSW notified RNCM of request. No further CSW needs at this time.   Archie BalboaMackenzie Irwin, LCSWA  Clinical Social Work Department  [facility]  [phone]

## 2018-06-20 NOTE — ED Notes (Signed)
Report given to RN

## 2018-06-20 NOTE — H&P (Signed)
History and Physical    Charles Thompson XNT:[id number] DOB: 1960/12/17 DOA: 06/20/2018  PCP: Charles Pao, MD   I have personally briefly reviewed patient's old medical records in [place]  Chief Complaint: diabetic foot ulcer, infected  HPI: Charles Thompson is Charles Thompson 57 y.o. male with medical history significant of T2DM, HTN, peripheral neuropathy presenting with worsening redness, warmth, swelling and drainage from his diabetic foot ulcer.    He notes that he has had Charles Thompson diabetic foot ulcer for about Charles Thompson year.  4 days ago he notes worsening redness, swelling, warmth and drainage.  He also has noticed an odor to the wound.  He denies any fevers, but does note chills.  He denies any chest pain, shortness of breath, nausea, vomiting.  He does smoke.  He went to his podiatrist today who recommended that he come to the emergency department for IV antibiotics and consideration of debridement.   ED Course: labs, plain films, abx, culture.  Admit for diabetic foot infection.  Review of Systems: As per HPI otherwise 10 point review of systems negative.   Past Medical History:  Diagnosis Date  . Diabetes mellitus without complication ([place])   . Hypertension   . Peripheral neuropathy     Past Surgical History:  Procedure Laterality Date  . EYE SURGERY       reports that he has been smoking cigarettes.  He has never used smokeless tobacco. He reports that he does not drink alcohol or use drugs.  No Known Allergies  No family history on file. Denies fam hx of DM or CAD  Prior to Admission medications   Medication Sig Start Date End Date Taking? Authorizing Provider  amLODipine (NORVASC) 10 MG tablet Take 10 mg by mouth daily. 04/04/15   [provider]  aspirin 81 MG tablet Take 81 mg by mouth daily.    [provider]  atorvastatin (LIPITOR) 10 MG tablet Take 10 mg by mouth daily. 04/04/15   [provider]  INVOKANA 300 MG TABS tablet Take 300 mg by mouth  daily. 04/04/15   [provider]  KOMBIGLYZE XR 2.03-999 MG TB24 Take 1 tablet by mouth 2 (two) times daily. 04/04/15   [provider]  omeprazole (PRILOSEC) 20 MG capsule Take 20 mg by mouth daily as needed (heartburn).  02/08/15   [provider]  TOUJEO SOLOSTAR 300 UNIT/ML SOPN Inject 65 Units into the skin 2 (two) times daily. 03/25/15   [provider]    Physical Exam: Vitals:   06/20/18 0947 06/20/18 0951 06/20/18 1130 06/20/18 1300  BP: (!) 158/85  130/78 (!) 157/82  Pulse: (!) 105  82 84  Resp: _0 Temp: 98 F (36.7 C)     TempSrc: Oral     SpO2: 99%  97% 97%  Weight:  113.4 kg (250 lb)    Height:  6' (1.829 m)      Constitutional: NAD, calm, comfortable Vitals:   06/20/18 0947 06/20/18 0951 06/20/18 1130 06/20/18 1300  BP: (!) 158/85  130/78 (!) 157/82  Pulse: (!) 105  82 84  Resp: _1 Temp: 98 F (36.7 C)     TempSrc: Oral     SpO2: 99%  97% 97%  Weight:  113.4 kg (250 lb)    Height:  6' (1.829 m)     Eyes: PERRL, lids and conjunctivae normal ENMT: Mucous membranes are moist. Posterior pharynx clear of any exudate or lesions.Normal  dentition.  Neck: normal, supple, no masses, no thyromegaly Respiratory: clear to auscultation bilaterally, no wheezing, no crackles. Normal respiratory effort. No accessory muscle use.  Cardiovascular: Regular rate and rhythm, no murmurs / rubs / gallops. No extremity edema. Faint DP pulses.  Abdomen: no tenderness, no masses palpated. No hepatosplenomegaly. Bowel sounds positive.  Musculoskeletal: no clubbing / cyanosis. No joint deformity upper and lower extremities. Good ROM, no contractures. Normal muscle tone.  Skin: L foot with ulcer with purulent discharge, foul smelling, erythema to dorsal surface of foot Neurologic: CN 2-12 grossly intact. Sensation intact. Strength 5/5 in all 4.  Psychiatric: Normal judgment and insight. Alert and oriented x 3. Normal mood.   Labs on  Admission: I have personally reviewed following labs and imaging studies  CBC: Recent Labs  Lab 06/20/18 1104  WBC 10.7*  NEUTROABS 7.7  HGB 15.7  HCT 45.3  MCV 86.1  PLT 261   Basic Metabolic Panel: Recent Labs  Lab 06/20/18 1104  NA 138  K 3.8  CL 104  CO2 26  GLUCOSE 164*  BUN 13  CREATININE 0.83  CALCIUM 9.0   GFR: Estimated Creatinine Clearance: 127.6 mL/min (by C-G formula based on SCr of 0.83 mg/dL). Liver Function Tests: No results for input(s): AST, ALT, ALKPHOS, BILITOT, PROT, ALBUMIN in the last 168 hours. No results for input(s): LIPASE, AMYLASE in the last 168 hours. No results for input(s): AMMONIA in the last 168 hours. Coagulation Profile: No results for input(s): INR, PROTIME in the last 168 hours. Cardiac Enzymes: No results for input(s): CKTOTAL, CKMB, CKMBINDEX, TROPONINI in the last 168 hours. BNP (last 3 results) No results for input(s): PROBNP in the last 8760 hours. HbA1C: No results for input(s): HGBA1C in the last 72 hours. CBG: Recent Labs  Lab 06/20/18 1103  GLUCAP 165*   Lipid Profile: No results for input(s): CHOL, HDL, LDLCALC, TRIG, CHOLHDL, LDLDIRECT in the last 72 hours. Thyroid Function Tests: No results for input(s): TSH, T4TOTAL, FREET4, T3FREE, THYROIDAB in the last 72 hours. Anemia Panel: No results for input(s): VITAMINB12, FOLATE, FERRITIN, TIBC, IRON, RETICCTPCT in the last 72 hours. Urine analysis:    Component Value Date/Time   COLORURINE YELLOW 12/18/2007 1926   APPEARANCEUR CLEAR 12/18/2007 1926   LABSPEC 1.040 (H) 12/18/2007 1926   PHURINE 6.5 12/18/2007 1926   GLUCOSEU >1000 (Charles Thompson) 12/18/2007 1926   HGBUR NEGATIVE 12/18/2007 1926   HGBUR NEGATIVE POINT OF CARE RESULT 12/18/2007 1926   BILIRUBINUR NEGATIVE 12/18/2007 1926   KETONESUR 40 (Charles Thompson) 12/18/2007 1926   PROTEINUR NEGATIVE 12/18/2007 1926   UROBILINOGEN 1.0 12/18/2007 1926   NITRITE NEGATIVE 12/18/2007 1926   LEUKOCYTESUR NEGATIVE 12/18/2007 1926     Radiological Exams on Admission: Dg Foot Complete Left  Result Date: 06/20/2018 CLINICAL DATA:  Foot ulcer EXAM: LEFT FOOT - COMPLETE 3+ VIEW COMPARISON:  MR foot 10/20/2017 FINDINGS: No acute fracture or dislocation. No aggressive osseous lesion. Soft tissue wound overlying the plantar aspect of the first MTP joint. Fragmentation of medial hallux sesamoid on the lateral view adjacent to the soft tissue wound similar in appearance to the prior x-ray dated 08/23/2017. No periosteal reaction or other areas of suspected bone destruction. IMPRESSION: 1. Soft tissue wound along the plantar aspect of the first MTP joint. Fragmentation of medial hallux sesamoid on the lateral view adjacent to the soft tissue wound similar in appearance to the prior x-ray dated 08/23/2017. This likely reflects sesamoiditis versus less likely osteomyelitis. Electronically Signed   By: Hetal    Patel   On: 06/20/2018 10:52    EKG: Independently reviewed. none  Assessment/Plan Active Problems:   Diabetic foot infection (HCC)  Diabetic Foot Infection  Cellulitis:  Diabetic ulcer present for ~1 year, with signs of infection/cellulitis over past 4 days.  Went to podiatrist who recommended coming to ED for imaging studies, IV abx, and evaluation for debridement.  Normal ABI's from 2015. Plain film of L foot with sesamoiditis vs osteomyelitis Follow MRI of L foot S/p vanc/zosyn in ED -> vanc/ceftriaxone/flagyl -> narrow as able Follow blood cx LE wound orderset -> follow arterial studies, ESR, CRP, A1c, wound c/s, SW, CM, etc Orthopedic c/s (discussed with Dr. Sharol Given) MIVF  Leukocytosis: 2/2 above, follow  T2DM: per pt, has good control with A1c <6.  Follow A1c here.  He takes his long acting insulin in single large dose at night ~100 units (will split this here and decrease Amberleigh Gerken bit). SSI  HTN: continue amlodipine  HLD: continue atorvastatin, ASA for primary prevention  GERD: continue prilosec  Tobacco Abuse:  encouraged cessation.    DVT prophylaxis: lovenox Code Status: full  Family Communication: none at bedside  Disposition Plan: pending improvement, surgical evaluation  Consults called: orthopedics, Dr. Sharol Given  Admission status: observation    Fayrene Helper MD [facility] Pager [phone]  If 7PM-7AM, please contact night-coverage [email or web address] Password [email or web address]  06/20/2018, 1:04 PM

## 2018-06-21 DIAGNOSIS — E785 Hyperlipidemia, unspecified: Secondary | ICD-10-CM | POA: Diagnosis present

## 2018-06-21 DIAGNOSIS — E1169 Type 2 diabetes mellitus with other specified complication: Secondary | ICD-10-CM | POA: Diagnosis present

## 2018-06-21 DIAGNOSIS — M86272 Subacute osteomyelitis, left ankle and foot: Secondary | ICD-10-CM | POA: Diagnosis not present

## 2018-06-21 DIAGNOSIS — K219 Gastro-esophageal reflux disease without esophagitis: Secondary | ICD-10-CM | POA: Diagnosis present

## 2018-06-21 DIAGNOSIS — E46 Unspecified protein-calorie malnutrition: Secondary | ICD-10-CM | POA: Diagnosis present

## 2018-06-21 DIAGNOSIS — L089 Local infection of the skin and subcutaneous tissue, unspecified: Secondary | ICD-10-CM | POA: Diagnosis not present

## 2018-06-21 DIAGNOSIS — E11621 Type 2 diabetes mellitus with foot ulcer: Secondary | ICD-10-CM | POA: Diagnosis present

## 2018-06-21 DIAGNOSIS — Z794 Long term (current) use of insulin: Secondary | ICD-10-CM | POA: Diagnosis not present

## 2018-06-21 DIAGNOSIS — L03116 Cellulitis of left lower limb: Secondary | ICD-10-CM | POA: Diagnosis present

## 2018-06-21 DIAGNOSIS — L97529 Non-pressure chronic ulcer of other part of left foot with unspecified severity: Secondary | ICD-10-CM | POA: Diagnosis present

## 2018-06-21 DIAGNOSIS — M869 Osteomyelitis, unspecified: Secondary | ICD-10-CM | POA: Diagnosis present

## 2018-06-21 DIAGNOSIS — E876 Hypokalemia: Secondary | ICD-10-CM | POA: Diagnosis present

## 2018-06-21 DIAGNOSIS — E1142 Type 2 diabetes mellitus with diabetic polyneuropathy: Secondary | ICD-10-CM | POA: Diagnosis present

## 2018-06-21 DIAGNOSIS — E11628 Type 2 diabetes mellitus with other skin complications: Secondary | ICD-10-CM | POA: Diagnosis not present

## 2018-06-21 DIAGNOSIS — F17210 Nicotine dependence, cigarettes, uncomplicated: Secondary | ICD-10-CM | POA: Diagnosis present

## 2018-06-21 DIAGNOSIS — Z89411 Acquired absence of right great toe: Secondary | ICD-10-CM | POA: Diagnosis not present

## 2018-06-21 DIAGNOSIS — L97424 Non-pressure chronic ulcer of left heel and midfoot with necrosis of bone: Secondary | ICD-10-CM

## 2018-06-21 DIAGNOSIS — E119 Type 2 diabetes mellitus without complications: Secondary | ICD-10-CM | POA: Diagnosis not present

## 2018-06-21 DIAGNOSIS — I10 Essential (primary) hypertension: Secondary | ICD-10-CM | POA: Diagnosis present

## 2018-06-21 DIAGNOSIS — Z7982 Long term (current) use of aspirin: Secondary | ICD-10-CM | POA: Diagnosis not present

## 2018-06-21 LAB — COMPREHENSIVE METABOLIC PANEL
ALT: 14 U/L (ref 0–44)
AST: 11 U/L — AB (ref 15–41)
Albumin: 3.1 g/dL — ABNORMAL LOW (ref 3.5–5.0)
Alkaline Phosphatase: 68 U/L (ref 38–126)
Anion gap: 8 (ref 5–15)
BUN: 10 mg/dL (ref 6–20)
CHLORIDE: 106 mmol/L (ref 98–111)
CO2: 26 mmol/L (ref 22–32)
CREATININE: 0.85 mg/dL (ref 0.61–1.24)
Calcium: 8.8 mg/dL — ABNORMAL LOW (ref 8.9–10.3)
GFR calc Af Amer: >60 mL/min (ref >60)
GFR calc non Af Amer: >60 mL/min (ref >60)
GLUCOSE: 80 mg/dL (ref 70–99)
Potassium: 3.6 mmol/L (ref 3.5–5.1)
Sodium: 140 mmol/L (ref 135–145)
Total Bilirubin: 0.9 mg/dL (ref 0.3–1.2)
Total Protein: 6.2 g/dL — ABNORMAL LOW (ref 6.5–8.1)

## 2018-06-21 LAB — CBC
HCT: 41.1 % (ref 39.0–52.0)
Hemoglobin: 14 g/dL (ref 13.0–17.0)
MCH: 29.5 pg (ref 26.0–34.0)
MCHC: 34.1 g/dL (ref 30.0–36.0)
MCV: 86.5 fL (ref 78.0–100.0)
PLATELETS: 249 10*3/uL (ref 150–400)
RBC: 4.75 MIL/uL (ref 4.22–5.81)
RDW: 12.4 % (ref 11.5–15.5)
WBC: 7.9 10*3/uL (ref 4.0–10.5)

## 2018-06-21 LAB — GLUCOSE, CAPILLARY
GLUCOSE-CAPILLARY: 113 mg/dL — AB (ref 70–99)
Glucose-Capillary: 69 mg/dL — ABNORMAL LOW (ref 70–99)
Glucose-Capillary: 87 mg/dL (ref 70–99)

## 2018-06-21 LAB — MAGNESIUM: Magnesium: 2.1 mg/dL (ref 1.7–2.4)

## 2018-06-21 LAB — HIV ANTIBODY (ROUTINE TESTING W REFLEX): HIV SCREEN 4TH GENERATION: NONREACTIVE

## 2018-06-21 MED ORDER — INSULIN GLARGINE 100 UNIT/ML ~~LOC~~ SOLN
20.0000 [IU] | Freq: Once | SUBCUTANEOUS | Status: DC
  Filled 2018-06-21: qty 0.2

## 2018-06-21 MED ORDER — JUVEN PO PACK
1.0000 | PACK | Freq: Two times a day (BID) | ORAL | Status: DC
  Filled 2018-06-21 (×6): qty 1

## 2018-06-21 MED ORDER — INSULIN GLARGINE 100 UNIT/ML ~~LOC~~ SOLN
30.0000 [IU] | Freq: Once | SUBCUTANEOUS | Status: AC
  Administered 2018-06-21: 30 [IU] via SUBCUTANEOUS
  Filled 2018-06-21: qty 0.3

## 2018-06-21 MED ORDER — NICOTINE 21 MG/24HR TD PT24: 21.0000 mg | MEDICATED_PATCH | Freq: Once | TRANSDERMAL | Status: DC

## 2018-06-21 NOTE — Progress Notes (Signed)
Initial Nutrition Assessment  DOCUMENTATION CODES:   Obesity unspecified  INTERVENTION:   Juven Fruit Punch BID, each serving provides 95kcal and 2.5g of protein (amino acids glutamine and arginine)  NUTRITION DIAGNOSIS:   Increased nutrient needs related to wound healing as evidenced by estimated needs.  GOAL:   Patient will meet greater than or equal to 90% of their needs  MONITOR:   PO intake, Supplement acceptance, Weight trends, Labs  REASON FOR ASSESSMENT:   Consult Wound healing  ASSESSMENT:   Patient with PMH significant for DM, HTN, and peripheral neuropathy. Presents this admission with worsening redness, warmth, and drainage from his diabetic ulcer (Lt Great Toe). Admitted for diabetic foot infection with sesamoiditis.    Pt denies any loss in appetite PTA. States he typically eats two meals a day that consist of chocolate milk with muffin for breakfast and a variety of foods for dinner (ex. Pizza, hamburger with fries, beef steak with potatoes). Intake this admission has been inconsistent due to pt not liking hospital food. He had a hypoglycemic episode this morning and refused to drink the orange juice provided by tech but consumed two containers of fruit loops with milk. BS at 113 after eating. Discussed the importance of protein intake for wound healing. Pt feels he receives enough protein in his diet but is willing to Juven. Encouraged PO intake.   Pt endorses a UBW of 270 lb and a recent wt loss of 20 lb. Records are limited in wt history showing multiple stated weights at 250 lb. Nutrition-Focused physical exam completed.   [facility].   Medications reviewed and include: IV abx Labs reviewed.   NUTRITION - FOCUSED PHYSICAL EXAM:    Most Recent Value  Orbital Region  No depletion  Upper Arm Region  No depletion  Thoracic and Lumbar Region  No depletion  Buccal Region  No depletion  Temple Region  No depletion   Clavicle Bone Region  No depletion  Clavicle and Acromion Bone Region  No depletion  Scapular Bone Region  No depletion  Dorsal Hand  No depletion  Patellar Region  No depletion  Anterior Thigh Region  No depletion  Posterior Calf Region  No depletion  Edema (RD Assessment)  None  Hair  Reviewed  Eyes  Reviewed  Mouth  Reviewed  Skin  Reviewed  Nails  Reviewed     Diet Order:   Diet Order           Diet heart healthy/carb modified Room service appropriate? Yes; Fluid consistency: Thin  Diet effective now          EDUCATION NEEDS:   Education needs have been addressed  Skin:  Skin Assessment: Skin Integrity Issues: Skin Integrity Issues:: Diabetic Ulcer Diabetic Ulcer: Left great toe  Last BM:  PTA  Height:   Ht Readings from Last 1 Encounters:  06/20/18 6' (1.829 m)    Weight:   Wt Readings from Last 1 Encounters:  06/20/18 250 lb (113.4 kg)    Ideal Body Weight:  80.9 kg  BMI:  Body mass index is 33.91 kg/m.  Estimated Nutritional Needs:   Kcal:  2200-2400 kcal  Protein:  110-120 grams   Fluid:  >/= 2.2 L/day    Vanessa Kickarly Avelardo Reesman RD, LDN Clinical Nutrition Pager # - [phone]

## 2018-06-21 NOTE — Progress Notes (Signed)
Hypoglycemic Event  CBG: 69  Treatment: 15 GM carbohydrate snack  Symptoms: Pale  Follow-up CBG: Time: CBG Result:  Possible Reasons for Event: Unknown  Comments/MD notified:Yes    Rowena Moilanen A Leretha DykesFraser

## 2018-06-21 NOTE — Consult Note (Signed)
[facility] Nurse wound consult note Reason for Consult: Neuropathic foot ulcer on first met head.  Simultaneously consulted with Orthopedics (Dr. Sharol Given), who plans for first ray amputation on Wednesday at [facility]. Wound type:Neuropathic Measurements:  3cm x 3cm x 1cm ulceration with deep red and necrotic tissue in wound bed. Light brown (serosanguinous exudate in a small amount) Periwound callous measuring 1cm circumferentially.  [place] [facility] nursing team will not follow, but will remain available to this patient, the nursing and medical teams.  Please re-consult if needed. Thanks, Maudie Flakes, MSN, RN, [place], Arther Abbott  Pager# [phone]

## 2018-06-21 NOTE — Progress Notes (Signed)
Transported to [facility] 13-01 via carelink at 2040

## 2018-06-21 NOTE — Progress Notes (Signed)
Patient Demographics:    Charles Thompson, is a 57 y.o. male, DOB - 26-Jul-1961, HCW:[id number]  Admit date - 06/20/2018   Admitting Physician A Melven Sartorius., MD  Outpatient Primary MD for the patient is [name], [name], MD  LOS - 0   Chief Complaint  Patient presents with  . Skin Ulcer        Subjective:    [name] today has no fevers, no emesis,  No chest pain, patient wants to smoke, no diarrhea  Assessment  & Plan :    Active Problems:   Diabetic foot infection ([place])   Cellulitis of left lower extremity   Diabetic ulcer of left foot associated with type 2 diabetes mellitus (HCC)    Radiographs shows destruction/osteomyelitis of the sesamoid left great toe.   Brief Summary  57 y.o. male  requires treatment for type 2 diabetes recently well controlled, hypertension, tobacco abuse and peripheral neuropathy who presents with worsening left foot diabetic ulcer, failed outpatient treatment, patient states that over the last 8 months he has been seen podiatry and I was also seen at the wound clinic despite outpatient treatment the left foot diabetic ulcer has gotten worse to the point of having purulent drainage swelling redness warmth and imaging studies now showing involvement of the sesamoid bone and concerns about osteomyelitis, patient was admitted on 06/20/2018, Dr. Sharol Given plans to do amputation of the left big toe at [facility] on 06/22/2018   Plan:-  1)Left Foot Diabetic infection--- failed outpatient treatment, consult from Dr. Sharol Given from 06/20/2018 appreciated, Dr. Sharol Given plans to do amputation of the left big toe at [facility] on 06/22/2018, continue IV Rocephin 2 g every 24 hours, continue Flagyl 500 mg every 8 hours, and continue vancomycin per pharmacy.  Afebrile, leukocytosis has resolved, white count is down to 7.9 from 10.7 on admission, on admission CRP was 7.3 with  ESR of 20  2)DM2-recently excellent control, last A1c is 6.6 this admission,  had hypoglycemic episodes, Allow some permissive Hyperglycemia rather than risk life-threatening hypoglycemia in a patient with unreliable oral intake (NPO for surgery). Use Novolog/Humalog Sliding scale insulin with Accu-Cheks/Fingersticks as ordered, so will reduce Lantus insulin to 30 units every morning and 20 units every PM (PTA pt was taking TOUJEO 65 units twice a day).,  Hold Lamont, also hold SYNJARDY for now to avoid significant hypoglycemia. Patient will be n.p.o. for surgery so insulin regimen has been adjusted downward please readdress insulin requirements postop  3)HTN-- Stable, continue Amlodipine 10 mg daily  4)Tobacco Abuse-  Smoking cessation counseling for 4 minutes today, use nicotine patch  5)Dyslipidemia-stable, continue Lipitor 10 mg daily and aspirin 81 mg daily  Code Status : Full    Disposition Plan  : Dr. Sharol Given plans to do amputation of the left big toe at [facility] on 06/22/2018 Consults  :  Dr Sharol Given (ortho)  DVT Prophylaxis  :  Lovenox   Lab Results  Component Value Date   PLT 249 06/21/2018    Inpatient Medications  Scheduled Meds: . amLODipine  10 mg Oral Daily  . aspirin EC  81 mg Oral Daily  . atorvastatin  10 mg Oral Daily  . enoxaparin (LOVENOX) injection  50 mg Subcutaneous Q24H  . insulin aspart  0-5 Units Subcutaneous QHS  . insulin aspart  0-9 Units Subcutaneous TID WC  . insulin glargine  20 Units Subcutaneous Once  . metroNIDAZOLE  500 mg Oral Q8H  . nicotine  21 mg Transdermal Daily  . nutrition supplement (JUVEN)  1 packet Oral BID BM  . pantoprazole  40 mg Oral Daily   Continuous Infusions: . sodium chloride 125 mL/hr at 06/21/18 0600  . cefTRIAXone (ROCEPHIN)  IV Stopped (06/20/18 1527)  . vancomycin Stopped (06/21/18 0319)   PRN Meds:.acetaminophen **OR** acetaminophen, ondansetron, polyethylene glycol,  zolpidem   Anti-infectives (From admission, onward)   Start     Dose/Rate Route Frequency Ordered Stop   06/21/18 0200  vancomycin (VANCOCIN) 1,250 mg in sodium chloride 0.9 % 250 mL IVPB     1,250 mg 166.7 mL/hr over 90 Minutes Intravenous Every 12 hours 06/20/18 1242     06/20/18 1600  cefTRIAXone (ROCEPHIN) 2 g in sodium chloride 0.9 % 100 mL IVPB     2 g 200 mL/hr over 30 Minutes Intravenous Every 24 hours 06/20/18 1334     06/20/18 1400  metroNIDAZOLE (FLAGYL) tablet 500 mg     500 mg Oral Every 8 hours 06/20/18 1334     06/20/18 1245  vancomycin (VANCOCIN) 2,000 mg in sodium chloride 0.9 % 500 mL IVPB     2,000 mg 250 mL/hr over 120 Minutes Intravenous  Once 06/20/18 1242 06/20/18 1511   06/20/18 1230  vancomycin (VANCOCIN) IVPB 1000 mg/200 mL premix  Status:  Discontinued     1,000 mg 200 mL/hr over 60 Minutes Intravenous  Once 06/20/18 1218 06/20/18 1241   06/20/18 1230  piperacillin-tazobactam (ZOSYN) IVPB 3.375 g     3.375 g 100 mL/hr over 30 Minutes Intravenous  Once 06/20/18 1218 06/20/18 1408        Objective:   Vitals:   06/20/18 1300 06/20/18 1331 06/20/18 2022 06/21/18 0519  BP: (!) 157/82 (!) 158/80 136/73 118/61  Pulse: 84 94 82 67  Resp: _0 Temp:  98.1 F (36.7 C) 99.5 F (37.5 C) 98.4 F (36.9 C)  TempSrc:   Oral   SpO2: 97% 100% 97% 100%  Weight:      Height:        Wt Readings from Last 3 Encounters:  06/20/18 113.4 kg (250 lb)  09/24/14 117.5 kg (259 lb)     Intake/Output Summary (Last 24 hours) at 06/21/2018 1426 Last data filed at 06/21/2018 0600 Gross per 24 hour  Intake 3117.44 ml  Output -  Net 3117.44 ml    Physical Exam  Gen:- Awake Alert,  In no apparent distress  HEENT:- [place].AT, No sclera icterus Neck-Supple Neck,No JVD,.  Lungs-  CTAB , good air movement CV- S1, S2 normal Abd-  +ve B.Sounds, Abd Soft, No tenderness,    Extremity/Skin:- Left foot - first metatarsal area on the sole with ulcer- 3cm x 3cm x 1cm  ulceration with deep red and necrotic tissue in wound bed. Light brown (serosanguinous exudate in a small amount) Periwound callous measuring 1cm circumferentially. S/p healed amputation of Right big toe psych-affect is appropriate, oriented x3 Neuro-no new focal deficits, no tremors   Data Review:   Micro Results Recent Results (from the past 240 hour(s))  Culture, blood (routine x 2)     Status: None (Preliminary result)   Collection Time: 06/20/18 11:55 AM  Result Value Ref Range Status   Specimen  Description   Final    BLOOD RIGHT HAND Performed at [facility], [place] [address] [place], [place] [address]    Special Requests   Final    BOTTLES DRAWN AEROBIC AND ANAEROBIC Blood Culture adequate volume Performed at [facility] [address] [place], [place] [address]    Culture   Final    NO GROWTH < 24 HOURS Performed at [facility] [address] [place], [place] [address]    Report Status PENDING  Incomplete  Culture, blood (routine x 2)     Status: None (Preliminary result)   Collection Time: 06/20/18 12:50 PM  Result Value Ref Range Status   Specimen Description   Final    BLOOD LEFT HAND Performed at [facility] [address], [place], [place] [address]    Special Requests   Final    BOTTLES DRAWN AEROBIC AND ANAEROBIC Blood Culture results may not be optimal due to an inadequate volume of blood received in culture bottles Performed at [facility] [address], [place], [place] [address]    Culture   Final    NO GROWTH < 24 HOURS Performed at [facility] [address] [place], [place] [address]    Report Status PENDING  Incomplete    Radiology Reports Dg Foot Complete Left  Result Date: 06/20/2018 CLINICAL DATA:  Foot ulcer EXAM: LEFT FOOT - COMPLETE 3+ VIEW COMPARISON:  MR foot 10/20/2017 FINDINGS: No acute fracture or dislocation. No aggressive osseous lesion.  Soft tissue wound overlying the plantar aspect of the first MTP joint. Fragmentation of medial hallux sesamoid on the lateral view adjacent to the soft tissue wound similar in appearance to the prior x-ray dated 08/23/2017. No periosteal reaction or other areas of suspected bone destruction. IMPRESSION: 1. Soft tissue wound along the plantar aspect of the first MTP joint. Fragmentation of medial hallux sesamoid on the lateral view adjacent to the soft tissue wound similar in appearance to the prior x-ray dated 08/23/2017. This likely reflects sesamoiditis versus less likely osteomyelitis. Electronically Signed   By: Kathreen Devoid   On: 06/20/2018 10:52     CBC Recent Labs  Lab 06/20/18 1104 06/20/18 1348 06/21/18 0525  WBC 10.7* 9.3 7.9  HGB 15.7 15.4 14.0  HCT 45.3 44.6 41.1  PLT 261 293 249  MCV 86.1 86.4 86.5  MCH 29.8 29.8 29.5  MCHC 34.7 34.5 34.1  RDW 12.5 12.4 12.4  LYMPHSABS 1.9  --   --   MONOABS 0.8  --   --   EOSABS 0.2  --   --   BASOSABS 0.0  --   --     Chemistries  Recent Labs  Lab 06/20/18 1104 06/20/18 1348 06/21/18 0525  NA 138  --  140  K 3.8  --  3.6  CL 104  --  106  CO2 26  --  26  GLUCOSE 164*  --  80  BUN 13  --  10  CREATININE 0.83 0.86 0.85  CALCIUM 9.0  --  8.8*  MG  --   --  2.1  AST  --   --  11*  ALT  --   --  14  ALKPHOS  --   --  68  BILITOT  --   --  0.9   ------------------------------------------------------------------------------------------------------------------ No results for input(s): CHOL, HDL, LDLCALC, TRIG, CHOLHDL, LDLDIRECT in the last 72 hours.  Lab Results  Component Value Date   HGBA1C 6.6 (H) 06/20/2018   ------------------------------------------------------------------------------------------------------------------ No results for input(s): TSH, T4TOTAL, T3FREE, THYROIDAB in the last 72 hours.  Invalid input(s):  FREET3 ------------------------------------------------------------------------------------------------------------------ No results for input(s): VITAMINB12, FOLATE, FERRITIN, TIBC, IRON, RETICCTPCT in the last 72 hours.  Coagulation profile No results for input(s): INR, PROTIME in the last 168 hours.  No results for input(s): DDIMER in the last 72 hours.  Cardiac Enzymes No results for input(s): CKMB, TROPONINI, MYOGLOBIN in the last 168 hours.  Invalid input(s): CK ------------------------------------------------------------------------------------------------------------------ No results found for: BNP   Roxan Hockey M.D on 06/21/2018 at 2:26 PM   Go to [email or web address] - password [email or web address] for contact info  [facility] - Office  [phone]

## 2018-06-21 NOTE — Progress Notes (Signed)
Inpatient Diabetes Program Recommendations  AACE/ADA: New Consensus Statement on Inpatient Glycemic Control (2015)  Target Ranges:  Prepandial:   less than 140 mg/dL      Peak postprandial:   less than 180 mg/dL (1-2 hours)      Critically ill patients:  140 - 180 mg/dL   Lab Results  Component Value Date   GLUCAP 113 (H) 06/21/2018   HGBA1C 6.6 (H) 06/20/2018    Review of Glycemic Control  Diabetes history: DM2 Outpatient Diabetes medications: Toujeo 65 units bid, Synjardy 12.03/999 mg QD, Kombiglyze 2.03/999 mg bid, Invokana 300 mg QD Current orders for Inpatient glycemic control: Lantus 30 units at 1000, 20 units QHS (Lantus injections are both for 1 dose.) Novolog 0-9 units tidwc and hs  HgbA1C - 6.6% - indicates good glycemic control [facility]. Pt likely non-compliant with diet/exercise for diabetes management. Had hypo of 69 this am.  Long discussion with pt regarding making changes in diet and exercise, taking insulin as prescribed. Pt very sad and depressed regarding surgery tomorrow.   Inpatient Diabetes Program Recommendations:     Change Novolog to 0-9 units Q4H after MN since pt will be NPO. Will need basal insulin ordered - reassess after surgery for reduced amount from home dose.  OP Diabetes Education consult for assistance with weight loss and lifestyle changes for diabetes management.  Will continue to follow.  Thank you. Charles Thompson, RD, LDN, CDE Inpatient Diabetes Coordinator [phone]

## 2018-06-21 NOTE — Progress Notes (Signed)
Patient arrived to 5 N via care link. Patient is ambulatory. Denies any pain at this time. Patient oriented to unit and equipment. Call light within reach.

## 2018-06-22 ENCOUNTER — Encounter (HOSPITAL_COMMUNITY): Payer: Self-pay | Admitting: Certified Registered"

## 2018-06-22 ENCOUNTER — Encounter (HOSPITAL_COMMUNITY)
Admission: EM | Disposition: A | Discharge status: Home / Self Care | Payer: Self-pay | Source: Ambulatory Visit | Attending: Internal Medicine

## 2018-06-22 ENCOUNTER — Inpatient Hospital Stay (HOSPITAL_COMMUNITY): Payer: Medicare HMO | Admitting: Certified Registered"

## 2018-06-22 DIAGNOSIS — L089 Local infection of the skin and subcutaneous tissue, unspecified: Secondary | ICD-10-CM

## 2018-06-22 DIAGNOSIS — M86272 Subacute osteomyelitis, left ankle and foot: Secondary | ICD-10-CM

## 2018-06-22 DIAGNOSIS — I10 Essential (primary) hypertension: Secondary | ICD-10-CM

## 2018-06-22 DIAGNOSIS — E876 Hypokalemia: Secondary | ICD-10-CM

## 2018-06-22 DIAGNOSIS — E119 Type 2 diabetes mellitus without complications: Secondary | ICD-10-CM

## 2018-06-22 DIAGNOSIS — Z794 Long term (current) use of insulin: Secondary | ICD-10-CM

## 2018-06-22 DIAGNOSIS — E11628 Type 2 diabetes mellitus with other skin complications: Secondary | ICD-10-CM

## 2018-06-22 HISTORY — PX: AMPUTATION: SHX166

## 2018-06-22 LAB — GLUCOSE, CAPILLARY
GLUCOSE-CAPILLARY: 84 mg/dL (ref 70–99)
GLUCOSE-CAPILLARY: 103 mg/dL — AB (ref 70–99)
GLUCOSE-CAPILLARY: 103 mg/dL — AB (ref 70–99)
GLUCOSE-CAPILLARY: 159 mg/dL — AB (ref 70–99)
Glucose-Capillary: 82 mg/dL (ref 70–99)
Glucose-Capillary: 93 mg/dL (ref 70–99)
Glucose-Capillary: 95 mg/dL (ref 70–99)

## 2018-06-22 LAB — BASIC METABOLIC PANEL
Anion gap: 12 (ref 5–15)
BUN: 6 mg/dL (ref 6–20)
CALCIUM: 8.4 mg/dL — AB (ref 8.9–10.3)
CO2: 23 mmol/L (ref 22–32)
Chloride: 103 mmol/L (ref 98–111)
Creatinine, Ser: 0.89 mg/dL (ref 0.61–1.24)
Glucose, Bld: 73 mg/dL (ref 70–99)
POTASSIUM: 3.3 mmol/L — AB (ref 3.5–5.1)
Sodium: 138 mmol/L (ref 135–145)

## 2018-06-22 LAB — SURGICAL PCR SCREEN
MRSA, PCR: NEGATIVE
STAPHYLOCOCCUS AUREUS: NEGATIVE

## 2018-06-22 LAB — CBC
HEMATOCRIT: 40.8 % (ref 39.0–52.0)
HEMOGLOBIN: 13.8 g/dL (ref 13.0–17.0)
MCH: 29.2 pg (ref 26.0–34.0)
MCHC: 33.8 g/dL (ref 30.0–36.0)
MCV: 86.4 fL (ref 78.0–100.0)
Platelets: 221 10*3/uL (ref 150–400)
RBC: 4.72 MIL/uL (ref 4.22–5.81)
RDW: 11.7 % (ref 11.5–15.5)
WBC: 6.6 10*3/uL (ref 4.0–10.5)

## 2018-06-22 SURGERY — AMPUTATION, FOOT, PARTIAL
Anesthesia: Monitor Anesthesia Care | Laterality: Left

## 2018-06-22 MED ORDER — 0.9 % SODIUM CHLORIDE (POUR BTL) OPTIME
TOPICAL | Status: DC | PRN
  Administered 2018-06-22: 1000 mL

## 2018-06-22 MED ORDER — PROPOFOL 10 MG/ML IV BOLUS
INTRAVENOUS | Status: AC
  Filled 2018-06-22: qty 20

## 2018-06-22 MED ORDER — ACETAMINOPHEN 325 MG PO TABS: 325.0000 mg | ORAL_TABLET | Freq: Four times a day (QID) | ORAL | Status: DC | PRN

## 2018-06-22 MED ORDER — MAGNESIUM CITRATE PO SOLN: 1.0000 | Freq: Once | ORAL | Status: DC | PRN

## 2018-06-22 MED ORDER — ONDANSETRON HCL 4 MG PO TABS: 4.0000 mg | ORAL_TABLET | Freq: Four times a day (QID) | ORAL | Status: DC | PRN

## 2018-06-22 MED ORDER — PROPOFOL 500 MG/50ML IV EMUL
INTRAVENOUS | Status: DC | PRN
  Administered 2018-06-22: 125 ug/kg/min via INTRAVENOUS

## 2018-06-22 MED ORDER — POTASSIUM CHLORIDE CRYS ER 20 MEQ PO TBCR: 40.0000 meq | EXTENDED_RELEASE_TABLET | Freq: Once | ORAL | Status: DC

## 2018-06-22 MED ORDER — METHOCARBAMOL 1000 MG/10ML IJ SOLN
500.0000 mg | Freq: Four times a day (QID) | INTRAVENOUS | Status: DC | PRN
  Filled 2018-06-22: qty 5

## 2018-06-22 MED ORDER — METOCLOPRAMIDE HCL 5 MG/ML IJ SOLN: 5.0000 mg | Freq: Three times a day (TID) | INTRAMUSCULAR | Status: DC | PRN

## 2018-06-22 MED ORDER — ROPIVACAINE HCL 7.5 MG/ML IJ SOLN
INTRAMUSCULAR | Status: DC | PRN
  Administered 2018-06-22: 20 mL via PERINEURAL

## 2018-06-22 MED ORDER — POVIDONE-IODINE 10 % EX SWAB: 2.0000 "application " | Freq: Once | CUTANEOUS | Status: DC

## 2018-06-22 MED ORDER — MIDAZOLAM HCL 5 MG/5ML IJ SOLN
INTRAMUSCULAR | Status: DC | PRN
  Administered 2018-06-22: 2 mg via INTRAVENOUS

## 2018-06-22 MED ORDER — BISACODYL 10 MG RE SUPP: 10.0000 mg | Freq: Every day | RECTAL | Status: DC | PRN

## 2018-06-22 MED ORDER — ONDANSETRON HCL 4 MG/2ML IJ SOLN
4.0000 mg | Freq: Four times a day (QID) | INTRAMUSCULAR | Status: DC | PRN
  Administered 2018-06-23: 4 mg via INTRAVENOUS

## 2018-06-22 MED ORDER — METOCLOPRAMIDE HCL 5 MG PO TABS: 5.0000 mg | ORAL_TABLET | Freq: Three times a day (TID) | ORAL | Status: DC | PRN

## 2018-06-22 MED ORDER — CEFAZOLIN SODIUM-DEXTROSE 2-4 GM/100ML-% IV SOLN
2.0000 g | INTRAVENOUS | Status: AC
  Administered 2018-06-22: 2 g via INTRAVENOUS

## 2018-06-22 MED ORDER — ONDANSETRON HCL 4 MG/2ML IJ SOLN
INTRAMUSCULAR | Status: DC | PRN
  Administered 2018-06-22: 4 mg via INTRAVENOUS

## 2018-06-22 MED ORDER — HYDROMORPHONE HCL 1 MG/ML IJ SOLN
0.5000 mg | INTRAMUSCULAR | Status: DC | PRN
  Filled 2018-06-22: qty 1

## 2018-06-22 MED ORDER — DOCUSATE SODIUM 100 MG PO CAPS
100.0000 mg | ORAL_CAPSULE | Freq: Two times a day (BID) | ORAL | Status: DC
  Administered 2018-06-22 – 2018-06-23 (×2): 100 mg via ORAL
  Filled 2018-06-22 (×2): qty 1

## 2018-06-22 MED ORDER — CHLORHEXIDINE GLUCONATE 4 % EX LIQD: 60.0000 mL | Freq: Once | CUTANEOUS | Status: DC

## 2018-06-22 MED ORDER — MIDAZOLAM HCL 2 MG/2ML IJ SOLN
INTRAMUSCULAR | Status: AC
  Filled 2018-06-22: qty 2

## 2018-06-22 MED ORDER — LIDOCAINE-EPINEPHRINE (PF) 1.5 %-1:200000 IJ SOLN
INTRAMUSCULAR | Status: DC | PRN
  Administered 2018-06-22: 10 mL via PERINEURAL

## 2018-06-22 MED ORDER — POLYETHYLENE GLYCOL 3350 17 G PO PACK: 17.0000 g | PACK | Freq: Every day | ORAL | Status: DC | PRN

## 2018-06-22 MED ORDER — PROPOFOL 10 MG/ML IV BOLUS
INTRAVENOUS | Status: DC | PRN
  Administered 2018-06-22: 50 mg via INTRAVENOUS

## 2018-06-22 MED ORDER — SODIUM CHLORIDE 0.9 % IV SOLN: INTRAVENOUS | Status: DC

## 2018-06-22 MED ORDER — INSULIN GLARGINE 100 UNIT/ML ~~LOC~~ SOLN
20.0000 [IU] | Freq: Every day | SUBCUTANEOUS | Status: DC
  Administered 2018-06-23: 20 [IU] via SUBCUTANEOUS
  Filled 2018-06-22: qty 0.2

## 2018-06-22 MED ORDER — OXYCODONE HCL 5 MG PO TABS
5.0000 mg | ORAL_TABLET | ORAL | Status: DC | PRN
  Administered 2018-06-23: 5 mg via ORAL
  Filled 2018-06-22: qty 1

## 2018-06-22 MED ORDER — METHOCARBAMOL 500 MG PO TABS: 500.0000 mg | ORAL_TABLET | Freq: Four times a day (QID) | ORAL | Status: DC | PRN

## 2018-06-22 MED ORDER — OXYCODONE HCL 5 MG PO TABS: 10.0000 mg | ORAL_TABLET | ORAL | Status: DC | PRN

## 2018-06-22 MED ORDER — LACTATED RINGERS IV SOLN
INTRAVENOUS | Status: DC
  Administered 2018-06-22: 16:00:00 via INTRAVENOUS

## 2018-06-22 SURGICAL SUPPLY — 36 items
BENZOIN TINCTURE PRP APPL 2/3 (GAUZE/BANDAGES/DRESSINGS) ×3 IMPLANT
BLADE SAW SGTL HD 18.5X60.5X1. (BLADE) ×1 IMPLANT
BLADE SURG 21 STRL SS (BLADE) ×1 IMPLANT
BNDG COHESIVE 4X5 TAN STRL (GAUZE/BANDAGES/DRESSINGS) IMPLANT
BNDG COHESIVE 6X5 TAN STRL LF (GAUZE/BANDAGES/DRESSINGS) IMPLANT
BNDG GAUZE ELAST 4 BULKY (GAUZE/BANDAGES/DRESSINGS) IMPLANT
COVER SURGICAL LIGHT HANDLE (MISCELLANEOUS) ×2 IMPLANT
DRAPE INCISE IOBAN 66X45 STRL (DRAPES) ×1 IMPLANT
DRAPE U-SHAPE 47X51 STRL (DRAPES) ×2 IMPLANT
DRSG ADAPTIC 3X8 NADH LF (GAUZE/BANDAGES/DRESSINGS) IMPLANT
DRSG PAD ABDOMINAL 8X10 ST (GAUZE/BANDAGES/DRESSINGS) IMPLANT
DURAPREP 26ML APPLICATOR (WOUND CARE) ×3 IMPLANT
ELECT REM PT RETURN 9FT ADLT (ELECTROSURGICAL) ×2
ELECTRODE REM PT RTRN 9FT ADLT (ELECTROSURGICAL) ×1 IMPLANT
GAUZE SPONGE 4X4 12PLY STRL (GAUZE/BANDAGES/DRESSINGS) ×1 IMPLANT
GLOVE BIOGEL PI IND STRL 9 (GLOVE) ×1 IMPLANT
GLOVE BIOGEL PI INDICATOR 9 (GLOVE) ×1
GLOVE SURG ORTHO 9.0 STRL STRW (GLOVE) ×2 IMPLANT
GOWN STRL REUS W/ TWL XL LVL3 (GOWN DISPOSABLE) ×3 IMPLANT
GOWN STRL REUS W/TWL XL LVL3 (GOWN DISPOSABLE) ×3
KIT BASIN OR (CUSTOM PROCEDURE TRAY) ×2 IMPLANT
KIT DRSG PREVENA PLUS 7DAY 125 (MISCELLANEOUS) ×1 IMPLANT
KIT PREVENA INCISION MGT 13 (CANNISTER) ×1 IMPLANT
KIT TURNOVER KIT B (KITS) ×2 IMPLANT
NS IRRIG 1000ML POUR BTL (IV SOLUTION) ×2 IMPLANT
PACK ORTHO EXTREMITY (CUSTOM PROCEDURE TRAY) ×2 IMPLANT
PAD ABD 8X10 STRL (GAUZE/BANDAGES/DRESSINGS) IMPLANT
PAD ARMBOARD 7.5X6 YLW CONV (MISCELLANEOUS) ×3 IMPLANT
SPONGE LAP 18X18 X RAY DECT (DISPOSABLE) IMPLANT
SUT ETHILON 2 0 PSLX (SUTURE) ×4 IMPLANT
SUT VIC AB 2-0 CTB1 (SUTURE) IMPLANT
TOWEL OR 17X24 6PK STRL BLUE (TOWEL DISPOSABLE) ×2 IMPLANT
TOWEL OR 17X26 10 PK STRL BLUE (TOWEL DISPOSABLE) ×2 IMPLANT
TUBE CONNECTING 12X1/4 (SUCTIONS) ×2 IMPLANT
WATER STERILE IRR 1000ML POUR (IV SOLUTION) ×2 IMPLANT
YANKAUER SUCT BULB TIP NO VENT (SUCTIONS) ×2 IMPLANT

## 2018-06-22 NOTE — Progress Notes (Signed)
[facility] PROGRESS NOTE  Charles Thompson ZOX:[id number] DOB: January 04, 1961 DOA: 06/20/2018  PCP: Haywood Pao, MD  Brief History/Interval Summary: 57 y.o.malewith past medical history of type 2 diabetes, essential hypertension, tobacco abuse and peripheral neuropathy who presented with worsening left foot diabetic ulcer, failed outpatient treatment.  Patient was seen by orthopedics.  Plan is for surgical intervention.    Reason for Visit: Diabetic foot infection  Consultants: Orthopedics  Procedures: None yet  Antibiotics: Vancomycin, ceftriaxone and metronidazole.  Subjective/Interval History: Patient denies any pain.  He states that his feet stay numb.  Denies any shortness of breath.  No history of heart disease.  ROS: Denies any headaches  Objective:  Vital Signs  Vitals:   06/21/18 1528 06/21/18 2104 06/22/18 0612 06/22/18 1211  BP:  (!) 151/72 (!) 146/76 (!) 150/94  Pulse:  63 68 85  Resp:  16 16   Temp:  98.6 F (37 C) 98 F (36.7 C) 97.8 F (36.6 C)  TempSrc:  Oral Axillary Oral  SpO2: 97% 100% 97% 100%  Weight:      Height:        Intake/Output Summary (Last 24 hours) at 06/22/2018 1325 Last data filed at 06/22/2018 8119 Gross per 24 hour  Intake 3466.91 ml  Output -  Net 3466.91 ml   Filed Weights   06/20/18 0951  Weight: 113.4 kg (250 lb)    General appearance: alert, cooperative, appears stated age and no distress Head: Normocephalic, without obvious abnormality, atraumatic Resp: clear to auscultation bilaterally Cardio: regular rate and rhythm, S1, S2 normal, no murmur, click, rub or gallop GI: soft, non-tender; bowel sounds normal; no masses,  no organomegaly Extremities: Mild erythema noted around the first 2 of the left foot. Pulses: 2+ and symmetric Neurologic: No obvious focal neurological deficits noted.  Lab Results:  Data Reviewed: I have personally reviewed following labs and imaging studies  CBC: Recent Labs  Lab  06/20/18 1104 06/20/18 1348 06/21/18 0525 06/22/18 0450  WBC 10.7* 9.3 7.9 6.6  NEUTROABS 7.7  --   --   --   HGB 15.7 15.4 14.0 13.8  HCT 45.3 44.6 41.1 40.8  MCV 86.1 86.4 86.5 86.4  PLT 261 293 249 147    Basic Metabolic Panel: Recent Labs  Lab 06/20/18 1104 06/20/18 1348 06/21/18 0525 06/22/18 0450  NA 138  --  140 138  K 3.8  --  3.6 3.3*  CL 104  --  106 103  CO2 26  --  26 23  GLUCOSE 164*  --  80 73  BUN 13  --  10 6  CREATININE 0.83 0.86 0.85 0.89  CALCIUM 9.0  --  8.8* 8.4*  MG  --   --  2.1  --     GFR: Estimated Creatinine Clearance: 119 mL/min (by C-G formula based on SCr of 0.89 mg/dL).  Liver Function Tests: Recent Labs  Lab 06/21/18 0525  AST 11*  ALT 14  ALKPHOS 68  BILITOT 0.9  PROT 6.2*  ALBUMIN 3.1*    HbA1C: Recent Labs    06/20/18 1348  HGBA1C 6.6*    CBG: Recent Labs  Lab 06/21/18 0742 06/21/18 1150 06/21/18 2242 06/22/18 0647 06/22/18 1208  GLUCAP 69* 113* 87 84 93     Recent Results (from the past 240 hour(s))  Culture, blood (routine x 2)     Status: None (Preliminary result)   Collection Time: 06/20/18 11:55 AM  Result Value Ref Range Status  Specimen Description   Final    BLOOD RIGHT HAND Performed at [facility] [address] [place], [place] [address]    Special Requests   Final    BOTTLES DRAWN AEROBIC AND ANAEROBIC Blood Culture adequate volume Performed at [facility] [address], [place], [place] [address]    Culture   Final    NO GROWTH < 24 HOURS Performed at [facility] [address], [place], [place] [address]    Report Status PENDING  Incomplete  Culture, blood (routine x 2)     Status: None (Preliminary result)   Collection Time: 06/20/18 12:50 PM  Result Value Ref Range Status   Specimen Description   Final    BLOOD LEFT HAND Performed at [facility] [address] [place], [place] [address]    Special  Requests   Final    BOTTLES DRAWN AEROBIC AND ANAEROBIC Blood Culture results may not be optimal due to an inadequate volume of blood received in culture bottles Performed at [facility] [address], [place], [place] [address]    Culture   Final    NO GROWTH < 24 HOURS Performed at [facility] [address] [place], [place] [address]    Report Status PENDING  Incomplete  Surgical pcr screen     Status: None   Collection Time: 06/22/18  5:52 AM  Result Value Ref Range Status   MRSA, PCR NEGATIVE NEGATIVE Final   Staphylococcus aureus NEGATIVE NEGATIVE Final    Comment: (NOTE) The Xpert SA Assay (FDA approved for NASAL specimens in patients 39 years of age and older), is one component of a comprehensive surveillance program. It is not intended to diagnose infection nor to guide or monitor treatment. Performed at [facility], [place] [address] [place], [place] [address]       Radiology Studies: No results found.   Medications:  Scheduled: . amLODipine  10 mg Oral Daily  . aspirin EC  81 mg Oral Daily  . atorvastatin  10 mg Oral Daily  . enoxaparin (LOVENOX) injection  50 mg Subcutaneous Q24H  . insulin aspart  0-5 Units Subcutaneous QHS  . insulin aspart  0-9 Units Subcutaneous TID WC  . insulin glargine  20 Units Subcutaneous Once  . metroNIDAZOLE  500 mg Oral Q8H  . nicotine  21 mg Transdermal Daily  . nutrition supplement (JUVEN)  1 packet Oral BID BM  . pantoprazole  40 mg Oral Daily  . potassium chloride  40 mEq Oral Once   Continuous: . cefTRIAXone (ROCEPHIN)  IV 2 g (06/21/18 1722)  . vancomycin 1,250 mg (06/22/18 0243)   UUE:[id number] **OR** acetaminophen, ondansetron, polyethylene glycol, zolpidem  Assessment/Plan:    Left foot diabetic infection Patient has failed outpatient treatment.  He was hospitalized and placed on IV antibiotics including vancomycin, Rocephin and Flagyl.  Seen by orthopedic surgeon.  Plan  is for surgical intervention today.  CRP was 7.3.  ESR 20.  Cultures negative so far.  Diabetes mellitus type 2 with peripheral neuropathy HbA1c 6.6.  Patient is on Lantus insulin as well as SSI.  Holding his oral agents.  Monitor CBGs closely.  Adjust insulin dose once he is able to take orally.  Essential hypertension Continue amlodipine.  Tobacco abuse Smoking cessation counseling was provided.  Nicotine patch.  Dyslipidemia Continue statin.  Hypokalemia Replace potassium.  DVT Prophylaxis: Lovenox    Code Status:  Full code Family Communication: Discussed with the patient Disposition Plan: Await surgery.    LOS: 1 day   [facility] Pager [phone] 06/22/2018, 1:25 PM  If 7PM-7AM, please contact night-coverage at [email or web address], password [facility]

## 2018-06-22 NOTE — Anesthesia Preprocedure Evaluation (Signed)
Anesthesia Evaluation  Patient identified by MRN, date of birth, ID band Patient awake    Reviewed: Allergy & Precautions, NPO status , Patient's Chart, lab work & pertinent test results  History of Anesthesia Complications Negative for: history of anesthetic complications  Airway Mallampati: II  TM Distance: >3 FB Neck ROM: Full    Dental  (+) Teeth Intact   Pulmonary neg shortness of breath, neg sleep apnea, neg COPD, neg recent URI, Current Smoker,    breath sounds clear to auscultation       Cardiovascular hypertension, Pt. on medications (-) angina(-) Past MI and (-) CHF negative cardio ROS   Rhythm:Regular     Neuro/Psych  Neuromuscular disease negative psych ROS   GI/Hepatic Neg liver ROS, GERD  Medicated and Controlled,  Endo/Other  diabetes, Type 2Morbid obesity  Renal/GU negative Renal ROS     Musculoskeletal   Abdominal   Peds  Hematology negative hematology ROS (+)   Anesthesia Other Findings   Reproductive/Obstetrics                             Anesthesia Physical Anesthesia Plan  ASA: III  Anesthesia Plan: MAC and Regional   Post-op Pain Management:    Induction:   PONV Risk Score and Plan: 0 and Treatment may vary due to age or medical condition  Airway Management Planned: Nasal Cannula  Additional Equipment:   Intra-op Plan:   Post-operative Plan:   Informed Consent: I have reviewed the patients History and Physical, chart, labs and discussed the procedure including the risks, benefits and alternatives for the proposed anesthesia with the patient or authorized representative who has indicated his/her understanding and acceptance.   Dental advisory given  Plan Discussed with: CRNA and Surgeon  Anesthesia Plan Comments:         Anesthesia Quick Evaluation

## 2018-06-22 NOTE — Transfer of Care (Signed)
Immediate Anesthesia Transfer of Care Note  Patient: Charles Thompson  Procedure(s) Performed: LEFT FOOT FIRST RAY AMPUTATION (Left )  Patient Location: PACU  Anesthesia Type:MAC and Regional  Level of Consciousness: awake, alert  and oriented  Airway & Oxygen Therapy: Patient Spontanous Breathing  Post-op Assessment: Report given to RN and Post -op Vital signs reviewed and stable  Post vital signs: Reviewed and stable  Last Vitals:  Vitals Value Taken Time  BP 123/68 06/22/2018  4:54 PM  Temp    Pulse 81 06/22/2018  4:55 PM  Resp 18 06/22/2018  4:55 PM  SpO2 97 % 06/22/2018  4:55 PM  Vitals shown include unvalidated device data.  Last Pain:  Vitals:   06/22/18 1211  TempSrc: Oral  PainSc:          Complications: No apparent anesthesia complications

## 2018-06-22 NOTE — Op Note (Addendum)
06/22/2018  4:59 PM  PATIENT:  Charles Thompson    PRE-OPERATIVE DIAGNOSIS:  osteomyelitis left foot  POST-OPERATIVE DIAGNOSIS:  Same  PROCEDURE:  LEFT FOOT FIRST RAY AMPUTATION application of Praveena wound VAC. Local tissue rearrangement for wound closure 4x9 cm  SURGEON:  Nadara MustardMarcus V Duda, MD  PHYSICIAN ASSISTANT:None ANESTHESIA:   General  PREOPERATIVE INDICATIONS:  Charles RayaKenneth Salahuddin is a  57 y.o. male with a diagnosis of osteomyelitis left foot who failed conservative measures and elected for surgical management.    The risks benefits and alternatives were discussed with the patient preoperatively including but not limited to the risks of infection, bleeding, nerve injury, cardiopulmonary complications, the need for revision surgery, among others, and the patient was willing to proceed.  OPERATIVE IMPLANTS: Praveena wound VAC  @ENCIMAGES @  OPERATIVE FINDINGS: No abscess proximal to the first ray.  Patient was brought the operating room after undergoing a regional block.  After adequate levels of anesthesia was obtained patient's left lower extremity  OPERATIVE PROCEDURE: Was prepped using DuraPrep draped into a sterile field a timeout was called.  There was foreign material within the wound.  The wound and first ray were resected in one block of tissue.  Electrocautery was used for hemostasis the wound was irrigated with normal saline local tissue rearrangement was required to close the wound which was 4 x 9 cm.  A Praveena wound VAC was applied this had a good suction fit patient was taken the PACU in stable condition.   DISCHARGE PLANNING:  Antibiotic duration: 24 hours postoperatively   Weightbearing: Ideally nonweightbearing on the left  Pain medication: High-dose opioid pathway ordered  Dressing care/ Wound VAC: Wound VAC continue for 1 week  Ambulatory devices: Walker  Discharge to: Home  Follow-up: In the office 1 week post operative.

## 2018-06-22 NOTE — Plan of Care (Signed)
  Problem: Activity: Goal: Risk for activity intolerance will decrease 06/22/2018 1500 by Darreld Mcleanox, Melba Araki, RN Outcome: Progressing 06/22/2018 1040 by Darreld Mcleanox, Raekwon Winkowski, RN Outcome: Progressing   Problem: Safety: Goal: Ability to remain free from injury will improve 06/22/2018 1500 by Darreld Mcleanox, Brycen Bean, RN Outcome: Progressing 06/22/2018 1040 by Darreld Mcleanox, Jacaden Forbush, RN Outcome: Progressing   Problem: Pain Managment: Goal: General experience of comfort will improve 06/22/2018 1500 by Darreld Mcleanox, Allexis Bordenave, RN Outcome: Progressing 06/22/2018 1040 by Darreld Mcleanox, Aloys Hupfer, RN Outcome: Progressing

## 2018-06-22 NOTE — Progress Notes (Signed)
Patient refused skin assessment. Patient educated and encouraged.

## 2018-06-22 NOTE — Interval H&P Note (Signed)
History and Physical Interval Note:  06/22/2018 8:06 AM  Charles Thompson  has presented today for surgery, with the diagnosis of osteomyelitis left foot  The various methods of treatment have been discussed with the patient and family. After consideration of risks, benefits and other options for treatment, the patient has consented to  Procedure(s): LEFT FOOT FIRST RAY AMPUTATION (Left) as a surgical intervention .  The patient's history has been reviewed, patient examined, no change in status, stable for surgery.  I have reviewed the patient's chart and labs.  Questions were answered to the patient's satisfaction.     Nadara MustardMarcus V Duda

## 2018-06-22 NOTE — Progress Notes (Signed)
Patient returns from OR at this time 

## 2018-06-22 NOTE — Plan of Care (Signed)

## 2018-06-22 NOTE — Progress Notes (Signed)
Patient to OR at this time

## 2018-06-22 NOTE — Anesthesia Procedure Notes (Signed)
Anesthesia Regional Block: Popliteal block   Pre-Anesthetic Checklist: ,, timeout performed, Correct Patient, Correct Site, Correct Laterality, Correct Procedure, Correct Position, site marked, Risks and benefits discussed,  Surgical consent,  Pre-op evaluation,  At surgeon's request and post-op pain management  Laterality: Lower and Left  Prep: chloraprep       Needles:  Injection technique: Single-shot  Needle Type: Echogenic Stimulator Needle          Additional Needles:   Procedures:,,,, ultrasound used (permanent image in chart),,,,  Narrative:  Start time: 06/22/2018 3:58 PM End time: 06/22/2018 4:02 PM Injection made incrementally with aspirations every 5 mL.  Performed by: Personally  Anesthesiologist: Charles Thompson, Charles Soberanis, MD  Additional Notes: H+P and labs reviewed, risks and benefits discussed with patient, procedure tolerated well without complications

## 2018-06-23 ENCOUNTER — Encounter (HOSPITAL_COMMUNITY): Payer: Self-pay | Admitting: Orthopedic Surgery

## 2018-06-23 LAB — BASIC METABOLIC PANEL
Anion gap: 9 (ref 5–15)
BUN: 12 mg/dL (ref 6–20)
CALCIUM: 8.6 mg/dL — AB (ref 8.9–10.3)
CO2: 24 mmol/L (ref 22–32)
CREATININE: 0.95 mg/dL (ref 0.61–1.24)
Chloride: 105 mmol/L (ref 98–111)
Glucose, Bld: 141 mg/dL — ABNORMAL HIGH (ref 70–99)
Potassium: 3.8 mmol/L (ref 3.5–5.1)
SODIUM: 138 mmol/L (ref 135–145)

## 2018-06-23 LAB — CBC
HCT: 40.1 % (ref 39.0–52.0)
Hemoglobin: 13.5 g/dL (ref 13.0–17.0)
MCH: 29 pg (ref 26.0–34.0)
MCHC: 33.7 g/dL (ref 30.0–36.0)
MCV: 86.2 fL (ref 78.0–100.0)
PLATELETS: 272 10*3/uL (ref 150–400)
RBC: 4.65 MIL/uL (ref 4.22–5.81)
RDW: 11.8 % (ref 11.5–15.5)
WBC: 7.5 10*3/uL (ref 4.0–10.5)

## 2018-06-23 LAB — GLUCOSE, CAPILLARY
GLUCOSE-CAPILLARY: 136 mg/dL — AB (ref 70–99)
GLUCOSE-CAPILLARY: 138 mg/dL — AB (ref 70–99)

## 2018-06-23 MED ORDER — DOCUSATE SODIUM 100 MG PO CAPS: 100.0000 mg | ORAL_CAPSULE | Freq: Two times a day (BID) | ORAL | 0 refills | Status: DC

## 2018-06-23 MED ORDER — POLYETHYLENE GLYCOL 3350 17 G PO PACK: 17.0000 g | PACK | Freq: Every day | ORAL | 0 refills | Status: DC | PRN

## 2018-06-23 MED ORDER — OXYCODONE-ACETAMINOPHEN 5-325 MG PO TABS: 1.0000 | ORAL_TABLET | ORAL | 0 refills | Status: DC | PRN

## 2018-06-23 MED ORDER — METHOCARBAMOL 500 MG PO TABS: 500.0000 mg | ORAL_TABLET | Freq: Four times a day (QID) | ORAL | 0 refills | Status: DC | PRN

## 2018-06-23 NOTE — Discharge Summary (Signed)
[facility]  Physician Discharge Summary   Patient ID: Charles Thompson MRN: [id number] DOB/AGE: Sep 20, 1961 57 y.o.  Admit date: 06/20/2018 Discharge date: 06/23/2018  PCP: Haywood Pao, MD  DISCHARGE DIAGNOSES:  Diabetic foot infection, status post ray amputation Diabetes mellitus type 2 with peripheral neuropathy Essential hypertension   RECOMMENDATIONS FOR OUTPATIENT FOLLOW UP: 1. Patient to follow-up with Dr. Sharol Given in 1 week.   DISCHARGE CONDITION: fair  Diet recommendation: Modified carbohydrate  Filed Weights   06/20/18 0951 06/22/18 1541  Weight: 113.4 kg 113.4 kg    INITIAL HISTORY: 57 y.o.malewith past medical history of type 2 diabetes, essential hypertension, tobacco abuse and peripheral neuropathy who presented with worsening left foot diabetic ulcer, failed outpatient treatment.  Patient was seen by orthopedics.  Plan is for surgical intervention.    Consultants: Orthopedics: Dr. Sharol Given  Procedures: LEFT FOOT [place] application of Praveena wound VAC. Local tissue rearrangement for wound closure 4x9 cm   HOSPITAL COURSE:   Left foot diabetic infection Patient failed outpatient treatment.  He was hospitalized and placed on IV antibiotics including vancomycin, Rocephin and Flagyl.  Seen by orthopedic surgeon.    Patient underwent left foot first ray amputation.  CRP was 7.3.  ESR 20.  Cultures negative so far.  Wound VAC was applied.  Okay for discharge today.  Seen by physical therapy.  Home health recommended which the patient has refused.  No need for antibiotics per orthopedics.  Diabetes mellitus type 2 with peripheral neuropathy HbA1c 6.6.    Continue home medication regimen.  Essential hypertension Continue amlodipine.  Tobacco abuse Smoking cessation counseling was provided.  Dyslipidemia Continue statin.  Hypokalemia Replaced  Stable for discharge today.  Discussed with Dr. Sharol Given.  Patient to follow-up with  him in 1 week.    PERTINENT LABS:  The results of significant diagnostics from this hospitalization (including imaging, microbiology, ancillary and laboratory) are listed below for reference.    Microbiology: Recent Results (from the past 240 hour(s))  Culture, blood (routine x 2)     Status: None (Preliminary result)   Collection Time: 06/20/18 11:55 AM  Result Value Ref Range Status   Specimen Description   Final    BLOOD RIGHT HAND Performed at [facility] [address], [place], [place] [address]    Special Requests   Final    BOTTLES DRAWN AEROBIC AND ANAEROBIC Blood Culture adequate volume Performed at [facility] [address], [place], [place] [address]    Culture   Final    NO GROWTH 2 DAYS Performed at [facility] [address], [place], [place] [address]    Report Status PENDING  Incomplete  Culture, blood (routine x 2)     Status: None (Preliminary result)   Collection Time: 06/20/18 12:50 PM  Result Value Ref Range Status   Specimen Description   Final    BLOOD LEFT HAND Performed at [facility] [address] [place], [place] [address]    Special Requests   Final    BOTTLES DRAWN AEROBIC AND ANAEROBIC Blood Culture results may not be optimal due to an inadequate volume of blood received in culture bottles Performed at [facility] [address], [place], [place] [address]    Culture   Final    NO GROWTH 2 DAYS Performed at [facility] [address] [place],  [address]    Report Status PENDING  Incomplete  Surgical pcr screen     Status: None   Collection Time: 06/22/18  5:52 AM  Result Value Ref Range Status   MRSA, PCR NEGATIVE NEGATIVE Final   Staphylococcus aureus NEGATIVE NEGATIVE Final    Comment: (NOTE) The Xpert SA Assay (FDA approved for NASAL specimens in patients 72 years of age and older), is one component of a  comprehensive surveillance program. It is not intended to diagnose infection nor to guide or monitor treatment. Performed at [facility], [place] [address] [place], [place] [address]      Labs: Basic Metabolic Panel: Recent Labs  Lab 06/20/18 1104 06/20/18 1348 06/21/18 0525 06/22/18 0450 06/23/18 0353  NA 138  --  140 138 138  K 3.8  --  3.6 3.3* 3.8  CL 104  --  106 103 105  CO2 26  --  26 23 24   GLUCOSE 164*  --  80 73 141*  BUN 13  --  10 6 12   CREATININE 0.83 0.86 0.85 0.89 0.95  CALCIUM 9.0  --  8.8* 8.4* 8.6*  MG  --   --  2.1  --   --    Liver Function Tests: Recent Labs  Lab 06/21/18 0525  AST 11*  ALT 14  ALKPHOS 68  BILITOT 0.9  PROT 6.2*  ALBUMIN 3.1*   CBC: Recent Labs  Lab 06/20/18 1104 06/20/18 1348 06/21/18 0525 06/22/18 0450 06/23/18 0353  WBC 10.7* 9.3 7.9 6.6 7.5  NEUTROABS 7.7  --   --   --   --   HGB 15.7 15.4 14.0 13.8 13.5  HCT 45.3 44.6 41.1 40.8 40.1  MCV 86.1 86.4 86.5 86.4 86.2  PLT 261 293 249 221 272    CBG: Recent Labs  Lab 06/22/18 1550 06/22/18 1653 06/22/18 2149 06/23/18 0634 06/23/18 1158  GLUCAP 95 103* 159* 136* 138*     IMAGING STUDIES Dg Foot Complete Left  Result Date: 06/20/2018 CLINICAL DATA:  Foot ulcer EXAM: LEFT FOOT - COMPLETE 3+ VIEW COMPARISON:  MR foot 10/20/2017 FINDINGS: No acute fracture or dislocation. No aggressive osseous lesion. Soft tissue wound overlying the plantar aspect of the first MTP joint. Fragmentation of medial hallux sesamoid on the lateral view adjacent to the soft tissue wound similar in appearance to the prior x-ray dated 08/23/2017. No periosteal reaction or other areas of suspected bone destruction. IMPRESSION: 1. Soft tissue wound along the plantar aspect of the first MTP joint. Fragmentation of medial hallux sesamoid on the lateral view adjacent to the soft tissue wound similar in appearance to the prior x-ray dated 08/23/2017. This likely reflects sesamoiditis versus  less likely osteomyelitis. Electronically Signed   By: Kathreen Devoid   On: 06/20/2018 10:52    DISCHARGE EXAMINATION: Vitals:   06/22/18 1720 06/22/18 1741 06/22/18 2327 06/23/18 0524  BP:  133/78 125/74 138/68  Pulse: 76 (!) 59 78 67  Resp: 20     Temp: 97.8 F (36.6 C) 98 F (36.7 C) 99.5 F (37.5 C) 98.5 F (36.9 C)  TempSrc:  Oral Oral Oral  SpO2: 97% 97% 98% 98%  Weight:      Height:       General appearance: alert, cooperative, appears stated age and no distress Resp: clear to auscultation bilaterally Cardio: regular rate and rhythm, S1, S2 normal, no murmur, click, rub or gallop GI: soft, non-tender; bowel sounds normal; no masses,  no organomegaly Wound VAC noted over the left foot  DISPOSITION: Home  Discharge Instructions    Call MD for:  extreme fatigue   Complete by:  As directed    Call MD for:  persistant dizziness or light-headedness   Complete by:  As directed    Call MD for:  persistant nausea and vomiting   Complete by:  As directed    Call MD for:  redness, tenderness, or signs of infection (pain, swelling, redness, odor or green/yellow discharge around incision site)   Complete by:  As directed    Call MD for:  severe uncontrolled pain   Complete by:  As directed    Call MD for:  temperature >100.4   Complete by:  As directed    Diet Carb Modified   Complete by:  As directed    Discharge instructions   Complete by:  As directed    Please be sure to follow-up with Dr. Sharol Given in 1 week.  You were cared for by a hospitalist during your hospital stay. If you have any questions about your discharge medications or the care you received while you were in the hospital after you are discharged, you can call the unit and asked to speak with the hospitalist on call if the hospitalist that took care of you is not available. Once you are discharged, your primary care physician will handle any further medical issues. Please note that NO REFILLS for any discharge  medications will be authorized once you are discharged, as it is imperative that you return to your primary care physician (or establish a relationship with a primary care physician if you do not have one) for your aftercare needs so that they can reassess your need for medications and monitor your lab values. If you do not have a primary care physician, you can call [phone] for a physician referral.   Increase activity slowly   Complete by:  As directed    Negative Pressure Wound Therapy - Incisional   Complete by:  As directed    Negative Pressure Wound Therapy - Incisional   Complete by:  As directed        Allergies as of 06/23/2018   No Known Allergies     Medication List    STOP taking these medications   INVOKANA 300 MG Tabs tablet Generic drug:  canagliflozin   KOMBIGLYZE XR 2.03-999 MG Tb24 Generic drug:  Saxagliptin-Metformin   lisinopril 20 MG tablet Commonly known as:  PRINIVIL,ZESTRIL     TAKE these medications   amLODipine 10 MG tablet Commonly known as:  NORVASC Take 10 mg by mouth daily.   aspirin 81 MG tablet Take 81 mg by mouth daily.   atorvastatin 10 MG tablet Commonly known as:  LIPITOR Take 10 mg by mouth daily.   docusate sodium 100 MG capsule Commonly known as:  COLACE Take 1 capsule (100 mg total) by mouth 2 (two) times daily.   gabapentin 300 MG capsule Commonly known as:  NEURONTIN Take 300 mg by mouth 3 (three) times daily.   gentamicin cream 0.1 % Commonly known as:  GARAMYCIN APPLY EXTERNALLY TO THE AFFECTED AREA 3 TIMES A WEEK   lisinopril-hydrochlorothiazide 20-12.5 MG tablet Commonly known as:  PRINZIDE,ZESTORETIC Take 1 tablet by mouth daily.   methocarbamol 500 MG tablet Commonly known as:  ROBAXIN Take 1 tablet (500 mg total) by mouth every 6 (six) hours as needed for muscle spasms.   omeprazole 20 MG capsule Commonly known as:  PRILOSEC Take 20 mg by mouth daily as needed (heartburn).  oxyCODONE-acetaminophen 5-325  MG tablet Commonly known as:  PERCOCET/ROXICET Take 1-2 tablets by mouth every 4 (four) hours as needed for severe pain.   polyethylene glycol packet Commonly known as:  MIRALAX / GLYCOLAX Take 17 g by mouth daily as needed for mild constipation.   SYNJARDY 12.03-999 MG Tabs Generic drug:  Empagliflozin-metFORMIN HCl Take 1 tablet by mouth daily.   TOUJEO SOLOSTAR 300 UNIT/ML Sopn Generic drug:  Insulin Glargine Inject 65 Units into the skin 2 (two) times daily.        Follow-up Information    [name], [name], MD.   Specialty:  Internal Medicine Contact information: [address] [place] [address] [phone]        Newt Minion, MD In 1 week.   Specialty:  Orthopedic Surgery Contact information: [place] [address] [phone]           TOTAL DISCHARGE TIME: 35 mins  [facility] Pager [phone]  06/23/2018, 1:46 PM

## 2018-06-23 NOTE — Progress Notes (Signed)
RN gave patient discharge instructions, along with 4 prescriptions all inserted in the discharge folder. Pt stated understanding, D/Cing with wound vac in place. Pt IV removed, not in any pain awaiting volunteers.

## 2018-06-23 NOTE — Progress Notes (Signed)
Pharmacy Antibiotic Note  Charles Thompson is a 57 y.o. male admitted on 06/20/2018 with DM foot infection/cellulitis.  Pharmacy has been consulted for vancomycin dosing.  S/p first ray amputation on 8/7.  Plan: Vanc 1250mg  IV Q12H (dosing per AUC at [facility]) CTX 2gm IV Q24H + Flagyl 500mg  PO Q8H per MD  Ortho states abx for 24 hrs post op and note states no signs of infection or abscess at resection. Consider stopping abx after today  Height: 6' 0.01" (182.9 cm) Weight: 250 lb (113.4 kg) IBW/kg (Calculated) : 77.62  Temp (24hrs), Avg:98.2 F (36.8 C), Min:97.5 F (36.4 C), Max:99.5 F (37.5 C)  Recent Labs  Lab 06/20/18 1104 06/20/18 1348 06/21/18 0525 06/22/18 0450 06/23/18 0353  WBC 10.7* 9.3 7.9 6.6 7.5  CREATININE 0.83 0.86 0.85 0.89 0.95    Estimated Creatinine Clearance: 111.5 mL/min (by C-G formula based on SCr of 0.95 mg/dL).    No Known Allergies   Thank you for allowing pharmacy to be a part of this patient's care.  Charles Thompson, PharmD, BCPS Clinical Pharmacist Phone number [phone] 06/23/2018 10:22 AM

## 2018-06-23 NOTE — Progress Notes (Signed)
Orthopedic Tech Progress Note Patient Details:  Ladean RayaKenneth Cleere 20-Oct-1961 [id number]  Ortho Devices Type of Ortho Device: Postop shoe/boot Ortho Device/Splint Location: lle Ortho Device/Splint Interventions: Application   Post Interventions Patient Tolerated: Well Instructions Provided: Care of device   Nikki DomCrawford, Arvell Pulsifer 06/23/2018, 8:35 AM

## 2018-06-23 NOTE — Progress Notes (Signed)
Pt stated that he had a walker at home CM said no needs he is good to go home. RN paged MD for Discharge orders

## 2018-06-23 NOTE — Progress Notes (Signed)
Patient ID: Charles RayaKenneth Thompson, male   DOB: 1960-12-14, 57 y.o.   MRN: [id number] Postoperative day 1 left foot first ray amputation.  No signs of infection or abscess at the resection margins.  Anticipate patient could discharge to home today after physical therapy.  Patient to follow-up with [employer] for orthotics and shoe wear.  VAC at discharge.  I will follow-up in the office in 1 week.

## 2018-06-23 NOTE — Care Management Note (Signed)
Case Management Note  Patient Details  Name: Charles Thompson MRN: [id number] Date of Birth: 1961/08/28  Subjective/Objective:    57 yr old male s/p amputation of left great toe, with application of prevena wound vac.                 Action/Plan: Case manager spoke with patient concerning discharge plan and Home Health needs. Patient politely declined Home Health therapy. Will have support at discharge.    Expected Discharge Date:  06/23/18               Expected Discharge Plan:  Home/Self Care  In-House Referral:  NA  Discharge planning Services  CM Consult  Post Acute Care Choice:  Home Health Choice offered to:  Patient  DME Arranged:  N/A(Has RW at Home) DME Agency:  NA  HH Arranged:  Patient Refused HH HH Agency:  NA  Status of Service:  Completed, signed off  If discussed at [employer] Length of Stay Meetings, dates discussed:    Additional Comments:  Durenda GuthrieBrady, Alyssha Housh Naomi, RN 06/23/2018, 1:09 PM

## 2018-06-23 NOTE — Evaluation (Signed)
Physical Therapy Evaluation Patient Details Name: Charles Thompson MRN: [id number] DOB: 10/13/1961 Today's Date: 06/23/2018   History of Present Illness  Pt is a 57 y/o male s/p L foot first ray amputation. PMH including but not limited to DM, HTN and peripheral neuropathy.  Clinical Impression  Pt presented supine in bed with HOB elevated, awake and willing to participate in therapy session. Prior to admission, pt reported that he was independent with all functional mobility and ADLs. Pt lives alone and will have assistance intermittently upon d/c. Pt currently able to perform bed mobility at modified independence, transfers with min guard and ambulate a short distance with RW and min guard for safety. Pt also successfully completed stair training this session with no concerns. Pt able to maintain NWB L LE throughout independently. Pt would continue to benefit from skilled physical therapy services at this time while admitted and after d/c to address the below listed limitations in order to improve overall safety and independence with functional mobility.     Follow Up Recommendations Home health PT    Equipment Recommendations  None recommended by PT    Recommendations for Other Services       Precautions / Restrictions Precautions Precautions: Fall Restrictions Weight Bearing Restrictions: Yes LLE Weight Bearing: Non weight bearing      Mobility  Bed Mobility Overal bed mobility: Modified Independent                Transfers Overall transfer level: Needs assistance Equipment used: Rolling walker (2 wheeled) Transfers: Sit to/from [employer] Transfers Sit to Stand: Min guard Stand pivot transfers: Min guard       General transfer comment: increased time and effort, good technique, min guard for safety  Ambulation/Gait Ambulation/Gait assistance: Min guard Gait Distance (Feet): 10 Feet(10' x2 with stair training in between) Assistive device: Rolling walker  (2 wheeled) Gait Pattern/deviations: (hop-to on R LE) Gait velocity: decreased Gait velocity interpretation: <1.31 ft/sec, indicative of household ambulator General Gait Details: pt steady with RW, able to maintain NWB L LE with hop-to pattern on R. Pt very limited secondary to nausea and emesis  Stairs Stairs: Yes Stairs assistance: Min guard Stair Management: Two rails;Step to pattern;Forwards Number of Stairs: 2 General stair comments: pt able to hop on R LE to ascend and descend steps with min guard for safety  Wheelchair Mobility    Modified Rankin (Stroke Patients Only)       Balance Overall balance assessment: Needs assistance Sitting-balance support: No upper extremity supported Sitting balance-Leahy Scale: Good     Standing balance support: During functional activity;Bilateral upper extremity supported;Single extremity supported Standing balance-Leahy Scale: Poor                               Pertinent Vitals/Pain Pain Assessment: 0-10 Pain Score: 2  Pain Location: plantar aspect of L foot Pain Descriptors / Indicators: Burning Pain Intervention(s): Monitored during session;Repositioned    Home Living Family/patient expects to be discharged to:: Private residence Living Arrangements: Alone Available Help at Discharge: Friend(s);Available PRN/intermittently Type of Home: House Home Access: Stairs to enter Entrance Stairs-Rails: Can reach both Entrance Stairs-Number of Steps: 2 Home Layout: One level Home Equipment: Walker - 2 wheels;Cane - single point;Bedside commode      Prior Function Level of Independence: Independent               Hand Dominance  Extremity/Trunk Assessment   Upper Extremity Assessment Upper Extremity Assessment: Overall WFL for tasks assessed    Lower Extremity Assessment Lower Extremity Assessment: Overall WFL for tasks assessed;LLE deficits/detail LLE Deficits / Details: pt with wound VAC on L  foot; pt able to maintain NWB L LE throughout independently LLE: Unable to fully assess due to pain    Cervical / Trunk Assessment Cervical / Trunk Assessment: Normal  Communication   Communication: No difficulties  Cognition Arousal/Alertness: Awake/alert Behavior During Therapy: WFL for tasks assessed/performed Overall Cognitive Status: Within Functional Limits for tasks assessed                                        General Comments      Exercises     Assessment/Plan    PT Assessment Patient needs continued PT services  PT Problem List Decreased balance;Decreased mobility;Decreased activity tolerance;Decreased coordination;Decreased knowledge of use of DME;Decreased safety awareness;Decreased knowledge of precautions;Pain       PT Treatment Interventions DME instruction;Gait training;Stair training;Functional mobility training;Therapeutic activities;Therapeutic exercise;Balance training;Neuromuscular re-education;Patient/family education    PT Goals (Current goals can be found in the Care Plan section)  Acute Rehab PT Goals Patient Stated Goal: return home ASAP PT Goal Formulation: With patient Time For Goal Achievement: 07/07/18 Potential to Achieve Goals: Good    Frequency Min 5X/week   Barriers to discharge        Co-evaluation               AM-PAC PT "6 Clicks" Daily Activity  Outcome Measure Difficulty turning over in bed (including adjusting bedclothes, sheets and blankets)?: None Difficulty moving from lying on back to sitting on the side of the bed? : None Difficulty sitting down on and standing up from a chair with arms (e.g., wheelchair, bedside commode, etc,.)?: Unable Help needed moving to and from a bed to chair (including a wheelchair)?: A Little Help needed walking in hospital room?: A Little Help needed climbing 3-5 steps with a railing? : A Little 6 Click Score: 18    End of Session Equipment Utilized During Treatment:  Gait belt Activity Tolerance: Other (comment)(pt limited secondary to nausea and emesis - RN notified) Patient left: in chair;with call bell/phone within reach Nurse Communication: Mobility status;Other (comment)(pt with several small bouts of emesis) PT Visit Diagnosis: Other abnormalities of gait and mobility (R26.89);Pain Pain - Right/Left: Left Pain - part of body: Ankle and joints of foot    Time: [id number] PT Time Calculation (min) (ACUTE ONLY): 34 min   Charges:   PT Evaluation $PT Eval Moderate Complexity: 1 Mod PT Treatments $Gait Training: 8-22 mins        TampaJennifer Luana Tatro, [place], [place] [phone]   Alessandra BevelsJennifer M Dalanie Kisner 06/23/2018, 1:13 PM

## 2018-06-24 ENCOUNTER — Encounter (HOSPITAL_COMMUNITY): Payer: Self-pay | Admitting: Orthopedic Surgery

## 2018-06-24 NOTE — Anesthesia Postprocedure Evaluation (Signed)
Anesthesia Post Note  Patient: Charles Thompson  Procedure(s) Performed: LEFT FOOT FIRST RAY AMPUTATION (Left )     Patient location during evaluation: PACU Anesthesia Type: Regional and MAC Level of consciousness: awake and alert Pain management: pain level controlled Vital Signs Assessment: post-procedure vital signs reviewed and stable Respiratory status: spontaneous breathing, nonlabored ventilation, respiratory function stable and patient connected to nasal cannula oxygen Cardiovascular status: stable and blood pressure returned to baseline Postop Assessment: no apparent nausea or vomiting Anesthetic complications: no    Last Vitals:  Vitals:   06/22/18 2327 06/23/18 0524  BP: 125/74 138/68  Pulse: 78 67  Resp:    Temp: 37.5 C 36.9 C  SpO2: 98% 98%    Last Pain:  Vitals:   06/23/18 0831  TempSrc:   PainSc: 7                  Amiel Mccaffrey

## 2018-06-25 LAB — CULTURE, BLOOD (ROUTINE X 2)
CULTURE: NO GROWTH
CULTURE: NO GROWTH
Special Requests: ADEQUATE

## 2018-06-29 ENCOUNTER — Ambulatory Visit (INDEPENDENT_AMBULATORY_CARE_PROVIDER_SITE_OTHER): Payer: Medicare HMO | Admitting: Family

## 2018-06-29 ENCOUNTER — Encounter (INDEPENDENT_AMBULATORY_CARE_PROVIDER_SITE_OTHER): Payer: Self-pay | Admitting: Family

## 2018-06-29 DIAGNOSIS — Z89432 Acquired absence of left foot: Secondary | ICD-10-CM

## 2018-06-29 MED ORDER — NICOTINE 14 MG/24HR TD PT24: 14.0000 mg | MEDICATED_PATCH | Freq: Every day | TRANSDERMAL | 0 refills | Status: DC

## 2018-07-01 ENCOUNTER — Telehealth (INDEPENDENT_AMBULATORY_CARE_PROVIDER_SITE_OTHER): Payer: Self-pay | Admitting: Orthopedic Surgery

## 2018-07-01 NOTE — Telephone Encounter (Signed)
This pt is wanting an rx for nicotine patch. Maybe like nicoderm? S/p GT amputation

## 2018-07-01 NOTE — Telephone Encounter (Signed)
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 He is wanting I am sure to file under insurance. Can you please write rx and SEND TO THE PHARMACY. I am leaving the office right now.

## 2018-07-01 NOTE — Telephone Encounter (Signed)
Patient can get a 21 mg nicotine patch over-the-counter.

## 2018-07-01 NOTE — Progress Notes (Signed)
   Post-Op Visit Note   Patient: Charles RayaKenneth Thompson           Date of Birth: [birth year]           MRN: [id number] Visit Date: 06/29/2018 PCP: Gaspar Garbeisovec, Richard W, MD  Chief Complaint: No chief complaint on file.   HPI:  HPI The patient who presents today 1 week status post left first ray amputation has been full weightbearing in a postop shoe.  Has not been elevating.  VAC removed today.  Does request a nicotine patch prescription. Ortho Exam Incision approximated with sutures.  There is considerable maceration and moderate swelling tension on the sutures.  No erythema no purulence no odor no sign of infection.  Visit Diagnoses:  1. Partial nontraumatic amputation of foot, left (HCC)     Plan: We will call in the nicotine patch.  Follow-up in the office in 2 weeks.  Stressed the importance of elevation and compression.  Strict nonweightbearing.  Cleanse incision daily apply dry dressing.  Follow-Up Instructions: Return in about 2 weeks (around 07/13/2018).   Imaging: No results found.  Orders:  No orders of the defined types were placed in this encounter.  Meds ordered this encounter  Medications  . DISCONTD: nicotine (NICODERM CQ - DOSED IN MG/24 HOURS) 14 mg/24hr patch    Sig: Place 1 patch (14 mg total) onto the skin daily.    Dispense:  28 patch    Refill:  0  . nicotine (NICODERM CQ - DOSED IN MG/24 HOURS) 14 mg/24hr patch    Sig: Place 1 patch (14 mg total) onto the skin daily.    Dispense:  28 patch    Refill:  0     PMFS History: Patient Active Problem List   Diagnosis Date Noted  . Subacute osteomyelitis, left ankle and foot (HCC)   . Diabetic foot infection (HCC) 06/20/2018  . Cellulitis of left lower extremity   . Diabetic ulcer of left foot associated with type 2 diabetes mellitus (HCC)    Past Medical History:  Diagnosis Date  . Diabetes mellitus without complication (HCC)   . Hypertension   . Peripheral neuropathy     History reviewed. No pertinent  family history.  Past Surgical History:  Procedure Laterality Date  . AMPUTATION Left 06/22/2018   Procedure: LEFT FOOT FIRST RAY AMPUTATION;  Surgeon: Nadara Mustarduda, Marcus V, MD;  [facility];  Service: Orthopedics;  Laterality: Left;  . EYE SURGERY     Social History   Occupational History  . Not on file  Tobacco Use  . Smoking status: Current Some Day Smoker    Types: Cigarettes  . Smokeless tobacco: Never Used  Substance and Sexual Activity  . Alcohol use: No    Alcohol/week: 0.0 standard drinks  . Drug use: Never  . Sexual activity: Not on file

## 2018-07-01 NOTE — Telephone Encounter (Signed)
Patient Request  [facility]   [address], [place], [place] [address]    Smoking Patches

## 2018-07-04 ENCOUNTER — Other Ambulatory Visit (INDEPENDENT_AMBULATORY_CARE_PROVIDER_SITE_OTHER): Payer: Self-pay

## 2018-07-04 ENCOUNTER — Telehealth (INDEPENDENT_AMBULATORY_CARE_PROVIDER_SITE_OTHER): Payer: Self-pay | Admitting: Orthopedic Surgery

## 2018-07-04 MED ORDER — NICOTINE 21 MG/24HR TD PT24: 21.0000 mg | MEDICATED_PATCH | Freq: Every day | TRANSDERMAL | 0 refills | Status: DC

## 2018-07-04 NOTE — Telephone Encounter (Signed)
Call in prescription for a nicotine patch.  Starting at 21 mg daily.

## 2018-07-04 NOTE — Telephone Encounter (Signed)
Called and lm on vm to advise that rx has been faxed to the pharm. To call with questions.

## 2018-07-04 NOTE — Telephone Encounter (Signed)
Patient called stating that the prescription for the nicotine patch needs to be [facility] [address].  CB#[phone].  Thank you.

## 2018-07-05 ENCOUNTER — Other Ambulatory Visit (INDEPENDENT_AMBULATORY_CARE_PROVIDER_SITE_OTHER): Payer: Self-pay

## 2018-07-05 MED ORDER — NICOTINE 21 MG/24HR TD PT24: 21.0000 mg | MEDICATED_PATCH | Freq: Every day | TRANSDERMAL | 0 refills | Status: DC

## 2018-07-05 NOTE — Telephone Encounter (Signed)
This has been done.

## 2018-07-13 ENCOUNTER — Ambulatory Visit (INDEPENDENT_AMBULATORY_CARE_PROVIDER_SITE_OTHER): Payer: Medicare HMO | Admitting: Orthopedic Surgery

## 2018-07-13 ENCOUNTER — Encounter (INDEPENDENT_AMBULATORY_CARE_PROVIDER_SITE_OTHER): Payer: Self-pay | Admitting: Orthopedic Surgery

## 2018-07-13 VITALS — Ht 72.0 in | Wt 250.0 lb

## 2018-07-13 DIAGNOSIS — Z89412 Acquired absence of left great toe: Secondary | ICD-10-CM

## 2018-07-13 MED ORDER — DOXYCYCLINE HYCLATE 100 MG PO CAPS: 100.0000 mg | ORAL_CAPSULE | Freq: Two times a day (BID) | ORAL | 1 refills | Status: DC

## 2018-07-13 NOTE — Progress Notes (Signed)
Office Visit Note   Patient: Charles Thompson           Date of Birth: 10/08/1961           MRN: [id number] Visit Date: 07/13/2018              Requested by: Gaspar Garbe, MD [address] [place], [place] [address] PCP: Gaspar Garbe, MD   Assessment & Plan: Visit Diagnoses: No diagnosis found.  Plan: We will start doxycycline 100 mg p.o. twice daily for the next 2 weeks.  He will continue to clean with soap and water and dry the area and apply gauze and Ace wrapping to help control edema.  I have encouraged him to get back into his postoperative shoe for the next 2 weeks and elevate as much as possible.  We will harvest the sutures today.  We will likely send to biotech next visit if incision continues to heal well for orthotics and possibly extra-depth shoes. Follow-Up Instructions: Return in about 2 weeks (around 07/27/2018).   Orders:  No orders of the defined types were placed in this encounter.  Meds ordered this encounter  Medications  . doxycycline (VIBRAMYCIN) 100 MG capsule    Sig: Take 1 capsule (100 mg total) by mouth 2 (two) times daily.    Dispense:  28 capsule    Refill:  1      Procedures: No procedures performed   Clinical Data: No additional findings.   Subjective: Chief Complaint  Patient presents with  . Left Foot - Routine Post Op    06/22/18 left foot 1st ray amputation     HPI Patient is a 57 year old male who was seen for follow-up following left foot first ray amputation.  He is 3 weeks postop.  He has not been on antibiotics.  He reports no pain and started wearing a regular shoe over the past several days.  He admits that he has not been keeping the foot elevated much and he stopped wearing any type of dressing to the area.  Previously we had him covering the area with gauze and Ace wrapping the foot to help control edema.     Review of Systems   Objective: Vital Signs: Ht 6' (1.829 m)   Wt 250 lb (113.4 kg)   BMI 33.91 kg/m     Physical Exam Patient is a well-nourished well-developed male in no apparent distress he is alert and oriented and appropriate. Ortho Exam Examination of the left foot shows the incision is intact and healing well except for one central area which appears slightly open and has some minimal serous drainage.  There is some mild erythema in the peri-incisional area but no odor and no erythema except for in the immediate peri-incisional area.  There is mild edema of the foot. Specialty Comments:  No specialty comments available.  Imaging: No results found.   PMFS History: Patient Active Problem List   Diagnosis Date Noted  . Subacute osteomyelitis, left ankle and foot (HCC)   . Diabetic foot infection (HCC) 06/20/2018  . Cellulitis of left lower extremity   . Diabetic ulcer of left foot associated with type 2 diabetes mellitus (HCC)    Past Medical History:  Diagnosis Date  . Diabetes mellitus without complication (HCC)   . Hypertension   . Peripheral neuropathy     History reviewed. No pertinent family history.  Past Surgical History:  Procedure Laterality Date  . AMPUTATION Left 06/22/2018   Procedure: LEFT FOOT  FIRST RAY AMPUTATION;  Surgeon: Nadara Mustarduda, Marcus V, MD;  [facility];  Service: Orthopedics;  Laterality: Left;  . EYE SURGERY     Social History   Occupational History  . Not on file  Tobacco Use  . Smoking status: Current Some Day Smoker    Types: Cigarettes  . Smokeless tobacco: Never Used  Substance and Sexual Activity  . Alcohol use: No    Alcohol/week: 0.0 standard drinks  . Drug use: Never  . Sexual activity: Not on file

## 2018-07-27 ENCOUNTER — Ambulatory Visit (INDEPENDENT_AMBULATORY_CARE_PROVIDER_SITE_OTHER): Payer: Medicare HMO | Admitting: Family

## 2018-07-27 ENCOUNTER — Encounter (INDEPENDENT_AMBULATORY_CARE_PROVIDER_SITE_OTHER): Payer: Self-pay | Admitting: Family

## 2018-07-27 VITALS — Ht 72.0 in | Wt 250.0 lb

## 2018-07-27 DIAGNOSIS — Z89412 Acquired absence of left great toe: Secondary | ICD-10-CM

## 2018-07-28 ENCOUNTER — Encounter (INDEPENDENT_AMBULATORY_CARE_PROVIDER_SITE_OTHER): Payer: Self-pay | Admitting: Family

## 2018-07-28 NOTE — Progress Notes (Signed)
   Post-Op Visit Note   Patient: Charles RayaKenneth Fasnacht           Date of Birth: 05-Feb-1961           MRN: [id number] Visit Date: 07/27/2018 PCP: Gaspar Garbeisovec, Richard W, MD  Chief Complaint:  Chief Complaint  Patient presents with  . Left Foot - Follow-up    06/22/18  Left Foot First Ray Amputation  . Right Foot - Pain    HPI:  HPI The patient is a 57 year old gentleman who is seen today status post left first ray amputation on August 7.  Today is full weightbearing in regular shoewear.  Has not been dressing his wound for about a week  Ortho Exam  On examination of the left foot first ray amputation there is a small 2 mm in diameter area that has yet to heal this is granulation tissue there is no drainage slight dependent erythema surrounding moderate swelling there is no warmth no purulence no odor no sign of infection.  Iodosorb dressing applied.  Visit Diagnoses:  1. History of amputation of left great toe [facility](HCC)     Plan: May increase his activities as tolerated.  Offered a prescription for custom orth[facility].  We will follow-up in the office as needed.  Continue with daily wound care until incision completely healed.  Follow-Up Instructions: Return if symptoms worsen or fail to improve.   Imaging: No results found.  Orders:  No orders of the defined types were placed in this encounter.  No orders of the defined types were placed in this encounter.    PMFS History: Patient Active Problem List   Diagnosis Date Noted  . Diabetic foot infection (HCC) 06/20/2018  . Diabetic ulcer of left foot associated with type 2 diabetes mellitus (HCC)    Past Medical History:  Diagnosis Date  . Diabetes mellitus without complication (HCC)   . Hypertension   . Peripheral neuropathy     History reviewed. No pertinent family history.  Past Surgical History:  Procedure Laterality Date  . AMPUTATION Left 06/22/2018   Procedure: LEFT FOOT FIRST RAY  AMPUTATION;  Surgeon: Nadara Mustarduda, Marcus V, MD;  [facility];  Service: Orthopedics;  Laterality: Left;  . EYE SURGERY     Social History   Occupational History  . Not on file  Tobacco Use  . Smoking status: Current Some Day Smoker    Types: Cigarettes  . Smokeless tobacco: Never Used  Substance and Sexual Activity  . Alcohol use: No    Alcohol/week: 0.0 standard drinks  . Drug use: Never  . Sexual activity: Not on file

## 2018-09-05 ENCOUNTER — Ambulatory Visit (INDEPENDENT_AMBULATORY_CARE_PROVIDER_SITE_OTHER): Payer: Medicare HMO | Admitting: Orthopedic Surgery

## 2018-09-05 ENCOUNTER — Encounter (INDEPENDENT_AMBULATORY_CARE_PROVIDER_SITE_OTHER): Payer: Self-pay | Admitting: Physician Assistant

## 2018-09-05 VITALS — Ht 72.0 in | Wt 250.0 lb

## 2018-09-05 DIAGNOSIS — Z89412 Acquired absence of left great toe: Secondary | ICD-10-CM

## 2018-09-05 DIAGNOSIS — T8781 Dehiscence of amputation stump: Secondary | ICD-10-CM

## 2018-09-06 ENCOUNTER — Ambulatory Visit (INDEPENDENT_AMBULATORY_CARE_PROVIDER_SITE_OTHER): Payer: Self-pay | Admitting: Physician Assistant

## 2018-09-06 DIAGNOSIS — Z89411 Acquired absence of right great toe: Secondary | ICD-10-CM | POA: Diagnosis not present

## 2018-09-06 DIAGNOSIS — E11319 Type 2 diabetes mellitus with unspecified diabetic retinopathy without macular edema: Secondary | ICD-10-CM | POA: Diagnosis not present

## 2018-09-06 DIAGNOSIS — E1140 Type 2 diabetes mellitus with diabetic neuropathy, unspecified: Secondary | ICD-10-CM | POA: Diagnosis not present

## 2018-09-06 DIAGNOSIS — E7800 Pure hypercholesterolemia, unspecified: Secondary | ICD-10-CM | POA: Diagnosis not present

## 2018-09-06 DIAGNOSIS — R808 Other proteinuria: Secondary | ICD-10-CM | POA: Diagnosis not present

## 2018-09-06 DIAGNOSIS — Z89412 Acquired absence of left great toe: Secondary | ICD-10-CM | POA: Diagnosis not present

## 2018-09-06 DIAGNOSIS — N182 Chronic kidney disease, stage 2 (mild): Secondary | ICD-10-CM | POA: Diagnosis not present

## 2018-09-06 DIAGNOSIS — Z794 Long term (current) use of insulin: Secondary | ICD-10-CM | POA: Diagnosis not present

## 2018-09-06 DIAGNOSIS — E1151 Type 2 diabetes mellitus with diabetic peripheral angiopathy without gangrene: Secondary | ICD-10-CM | POA: Diagnosis not present

## 2018-09-06 DIAGNOSIS — Z23 Encounter for immunization: Secondary | ICD-10-CM | POA: Diagnosis not present

## 2018-09-08 ENCOUNTER — Encounter (HOSPITAL_COMMUNITY): Payer: Self-pay

## 2018-09-08 NOTE — Progress Notes (Signed)
Spoke with patient for pre-op call. Pt denies cardiac history. Pt does have type II diabetes. Instructed patient to take half dose of Toujeo this evening, and half dose of Toujeo in the morning. Patient instructed to check blood sugar in the morning and if lower than 70 to treat with 1/2 cup of clear juice (apple or cranberry).

## 2018-09-09 ENCOUNTER — Ambulatory Visit (HOSPITAL_COMMUNITY)
Admission: RE | Admit: 2018-09-09 | Discharge: 2018-09-09 | Disposition: A | Discharge status: Home / Self Care | Payer: Medicare HMO | Source: Ambulatory Visit | Attending: Orthopedic Surgery | Admitting: Orthopedic Surgery

## 2018-09-09 ENCOUNTER — Ambulatory Visit (HOSPITAL_COMMUNITY): Payer: Medicare HMO | Admitting: Certified Registered Nurse Anesthetist

## 2018-09-09 ENCOUNTER — Encounter (HOSPITAL_COMMUNITY): Payer: Self-pay

## 2018-09-09 ENCOUNTER — Encounter (HOSPITAL_COMMUNITY)
Admission: RE | Disposition: A | Discharge status: Home / Self Care | Payer: Self-pay | Source: Ambulatory Visit | Attending: Orthopedic Surgery

## 2018-09-09 ENCOUNTER — Other Ambulatory Visit: Payer: Self-pay

## 2018-09-09 DIAGNOSIS — Z794 Long term (current) use of insulin: Secondary | ICD-10-CM | POA: Insufficient documentation

## 2018-09-09 DIAGNOSIS — E1169 Type 2 diabetes mellitus with other specified complication: Secondary | ICD-10-CM | POA: Insufficient documentation

## 2018-09-09 DIAGNOSIS — F17210 Nicotine dependence, cigarettes, uncomplicated: Secondary | ICD-10-CM | POA: Insufficient documentation

## 2018-09-09 DIAGNOSIS — E11621 Type 2 diabetes mellitus with foot ulcer: Secondary | ICD-10-CM

## 2018-09-09 DIAGNOSIS — Z79899 Other long term (current) drug therapy: Secondary | ICD-10-CM | POA: Diagnosis not present

## 2018-09-09 DIAGNOSIS — L97424 Non-pressure chronic ulcer of left heel and midfoot with necrosis of bone: Secondary | ICD-10-CM

## 2018-09-09 DIAGNOSIS — Z7982 Long term (current) use of aspirin: Secondary | ICD-10-CM | POA: Diagnosis not present

## 2018-09-09 DIAGNOSIS — M869 Osteomyelitis, unspecified: Secondary | ICD-10-CM | POA: Diagnosis not present

## 2018-09-09 DIAGNOSIS — E1140 Type 2 diabetes mellitus with diabetic neuropathy, unspecified: Secondary | ICD-10-CM | POA: Diagnosis not present

## 2018-09-09 DIAGNOSIS — M868X7 Other osteomyelitis, ankle and foot: Secondary | ICD-10-CM | POA: Diagnosis not present

## 2018-09-09 DIAGNOSIS — I10 Essential (primary) hypertension: Secondary | ICD-10-CM | POA: Insufficient documentation

## 2018-09-09 HISTORY — PX: AMPUTATION: SHX166

## 2018-09-09 LAB — HEMOGLOBIN A1C
HEMOGLOBIN A1C: 6.6 % — AB (ref 4.8–5.6)
Mean Plasma Glucose: 142.72 mg/dL

## 2018-09-09 LAB — CBC
HCT: 47.2 % (ref 39.0–52.0)
Hemoglobin: 15.7 g/dL (ref 13.0–17.0)
MCH: 29 pg (ref 26.0–34.0)
MCHC: 33.3 g/dL (ref 30.0–36.0)
MCV: 87.1 fL (ref 80.0–100.0)
PLATELETS: 335 10*3/uL (ref 150–400)
RBC: 5.42 MIL/uL (ref 4.22–5.81)
RDW: 12.3 % (ref 11.5–15.5)
WBC: 11.9 10*3/uL — ABNORMAL HIGH (ref 4.0–10.5)
nRBC: 0 % (ref 0.0–0.2)

## 2018-09-09 LAB — GLUCOSE, CAPILLARY: Glucose-Capillary: 153 mg/dL — ABNORMAL HIGH (ref 70–99)

## 2018-09-09 LAB — BASIC METABOLIC PANEL
Anion gap: 15 (ref 5–15)
BUN: 26 mg/dL — AB (ref 6–20)
CALCIUM: 9.4 mg/dL (ref 8.9–10.3)
CO2: 20 mmol/L — ABNORMAL LOW (ref 22–32)
CREATININE: 1.15 mg/dL (ref 0.61–1.24)
Chloride: 99 mmol/L (ref 98–111)
Glucose, Bld: 166 mg/dL — ABNORMAL HIGH (ref 70–99)
Potassium: 3.8 mmol/L (ref 3.5–5.1)
SODIUM: 134 mmol/L — AB (ref 135–145)

## 2018-09-09 SURGERY — AMPUTATION, FOOT, RAY
Anesthesia: Monitor Anesthesia Care | Site: Foot | Laterality: Left

## 2018-09-09 MED ORDER — ROPIVACAINE HCL 5 MG/ML IJ SOLN
INTRAMUSCULAR | Status: DC | PRN
  Administered 2018-09-09: 30 mL via PERINEURAL
  Administered 2018-09-09: 10 mL via PERINEURAL

## 2018-09-09 MED ORDER — MIDAZOLAM HCL 2 MG/2ML IJ SOLN
INTRAMUSCULAR | Status: AC
  Filled 2018-09-09: qty 2

## 2018-09-09 MED ORDER — FENTANYL CITRATE (PF) 100 MCG/2ML IJ SOLN: 25.0000 ug | INTRAMUSCULAR | Status: DC | PRN

## 2018-09-09 MED ORDER — PROPOFOL 500 MG/50ML IV EMUL
INTRAVENOUS | Status: DC | PRN
  Administered 2018-09-09: 100 ug/kg/min via INTRAVENOUS

## 2018-09-09 MED ORDER — OXYCODONE HCL 5 MG PO TABS: 5.0000 mg | ORAL_TABLET | Freq: Once | ORAL | Status: DC | PRN

## 2018-09-09 MED ORDER — ONDANSETRON HCL 4 MG/2ML IJ SOLN
INTRAMUSCULAR | Status: AC
  Filled 2018-09-09: qty 2

## 2018-09-09 MED ORDER — LACTATED RINGERS IV SOLN
INTRAVENOUS | Status: DC
  Administered 2018-09-09: 07:00:00 via INTRAVENOUS

## 2018-09-09 MED ORDER — 0.9 % SODIUM CHLORIDE (POUR BTL) OPTIME
TOPICAL | Status: DC | PRN
  Administered 2018-09-09: 1000 mL

## 2018-09-09 MED ORDER — FENTANYL CITRATE (PF) 250 MCG/5ML IJ SOLN
INTRAMUSCULAR | Status: AC
  Filled 2018-09-09: qty 5

## 2018-09-09 MED ORDER — PROPOFOL 10 MG/ML IV BOLUS
INTRAVENOUS | Status: DC | PRN
  Administered 2018-09-09: 50 mg via INTRAVENOUS

## 2018-09-09 MED ORDER — PROPOFOL 10 MG/ML IV BOLUS
INTRAVENOUS | Status: AC
  Filled 2018-09-09: qty 20

## 2018-09-09 MED ORDER — OXYCODONE HCL 5 MG/5ML PO SOLN: 5.0000 mg | Freq: Once | ORAL | Status: DC | PRN

## 2018-09-09 MED ORDER — LIDOCAINE 2% (20 MG/ML) 5 ML SYRINGE
INTRAMUSCULAR | Status: AC
  Filled 2018-09-09: qty 5

## 2018-09-09 MED ORDER — CEFAZOLIN SODIUM-DEXTROSE 2-4 GM/100ML-% IV SOLN
2.0000 g | INTRAVENOUS | Status: AC
  Administered 2018-09-09: 2 g via INTRAVENOUS
  Filled 2018-09-09: qty 100

## 2018-09-09 MED ORDER — ONDANSETRON HCL 4 MG/2ML IJ SOLN
INTRAMUSCULAR | Status: DC | PRN
  Administered 2018-09-09: 4 mg via INTRAVENOUS

## 2018-09-09 MED ORDER — PROPOFOL 10 MG/ML IV BOLUS
INTRAVENOUS | Status: AC
  Filled 2018-09-09: qty 40

## 2018-09-09 MED ORDER — MIDAZOLAM HCL 5 MG/5ML IJ SOLN
INTRAMUSCULAR | Status: DC | PRN
  Administered 2018-09-09: 2 mg via INTRAVENOUS

## 2018-09-09 MED ORDER — DEXAMETHASONE SODIUM PHOSPHATE 10 MG/ML IJ SOLN
INTRAMUSCULAR | Status: AC
  Filled 2018-09-09: qty 1

## 2018-09-09 MED ORDER — CHLORHEXIDINE GLUCONATE 4 % EX LIQD: 60.0000 mL | Freq: Once | CUTANEOUS | Status: DC

## 2018-09-09 MED ORDER — FENTANYL CITRATE (PF) 100 MCG/2ML IJ SOLN
INTRAMUSCULAR | Status: DC | PRN
  Administered 2018-09-09: 50 ug via INTRAVENOUS

## 2018-09-09 MED ORDER — PROPOFOL 1000 MG/100ML IV EMUL
INTRAVENOUS | Status: AC
  Filled 2018-09-09: qty 100

## 2018-09-09 SURGICAL SUPPLY — 30 items
BLADE SAW SGTL MED 73X18.5 STR (BLADE) IMPLANT
BLADE SURG 21 STRL SS (BLADE) ×2 IMPLANT
BNDG COHESIVE 4X5 TAN STRL (GAUZE/BANDAGES/DRESSINGS) ×2 IMPLANT
BNDG GAUZE ELAST 4 BULKY (GAUZE/BANDAGES/DRESSINGS) ×2 IMPLANT
COVER SURGICAL LIGHT HANDLE (MISCELLANEOUS) ×4 IMPLANT
COVER WAND RF STERILE (DRAPES) ×2 IMPLANT
DRAPE U-SHAPE 47X51 STRL (DRAPES) ×4 IMPLANT
DRSG ADAPTIC 3X8 NADH LF (GAUZE/BANDAGES/DRESSINGS) ×2 IMPLANT
DRSG PAD ABDOMINAL 8X10 ST (GAUZE/BANDAGES/DRESSINGS) ×4 IMPLANT
DURAPREP 26ML APPLICATOR (WOUND CARE) ×2 IMPLANT
ELECT REM PT RETURN 9FT ADLT (ELECTROSURGICAL) ×2
ELECTRODE REM PT RTRN 9FT ADLT (ELECTROSURGICAL) ×1 IMPLANT
GAUZE SPONGE 4X4 12PLY STRL (GAUZE/BANDAGES/DRESSINGS) ×2 IMPLANT
GAUZE SPONGE 4X4 12PLY STRL LF (GAUZE/BANDAGES/DRESSINGS) ×2 IMPLANT
GLOVE BIOGEL PI IND STRL 9 (GLOVE) ×1 IMPLANT
GLOVE BIOGEL PI INDICATOR 9 (GLOVE) ×1
GLOVE SURG ORTHO 9.0 STRL STRW (GLOVE) ×2 IMPLANT
GOWN STRL REUS W/ TWL XL LVL3 (GOWN DISPOSABLE) ×2 IMPLANT
GOWN STRL REUS W/TWL XL LVL3 (GOWN DISPOSABLE) ×2
KIT BASIN OR (CUSTOM PROCEDURE TRAY) ×2 IMPLANT
KIT TURNOVER KIT B (KITS) ×2 IMPLANT
NS IRRIG 1000ML POUR BTL (IV SOLUTION) ×2 IMPLANT
PACK ORTHO EXTREMITY (CUSTOM PROCEDURE TRAY) ×2 IMPLANT
PAD ABD 8X10 STRL (GAUZE/BANDAGES/DRESSINGS) ×2 IMPLANT
PAD ARMBOARD 7.5X6 YLW CONV (MISCELLANEOUS) ×4 IMPLANT
STOCKINETTE IMPERVIOUS LG (DRAPES) IMPLANT
SUT ETHILON 2 0 PSLX (SUTURE) ×2 IMPLANT
TOWEL OR 17X26 10 PK STRL BLUE (TOWEL DISPOSABLE) ×2 IMPLANT
TUBE CONNECTING 12X1/4 (SUCTIONS) ×2 IMPLANT
YANKAUER SUCT BULB TIP NO VENT (SUCTIONS) ×2 IMPLANT

## 2018-09-09 NOTE — Anesthesia Procedure Notes (Signed)
Procedure Name: MAC Date/Time: 09/09/2018 8:43 AM Performed by: Jenne Campus, CRNA Pre-anesthesia Checklist: Patient identified, Emergency Drugs available, Suction available and Patient being monitored Oxygen Delivery Method: Simple face mask

## 2018-09-09 NOTE — Anesthesia Procedure Notes (Signed)
Anesthesia Regional Block: Adductor canal block   Pre-Anesthetic Checklist: ,, timeout performed, Correct Patient, Correct Site, Correct Laterality, Correct Procedure, Correct Position, site marked, Risks and benefits discussed,  Surgical consent,  Pre-op evaluation,  At surgeon's request and post-op pain management  Laterality: Left  Prep: chloraprep       Needles:  Injection technique: Single-shot  Needle Type: Echogenic Needle     Needle Length: 9cm  Needle Gauge: 21     Additional Needles:   Procedures:,,,, ultrasound used (permanent image in chart),,,,  Narrative:  Start time: 09/09/2018 8:22 AM End time: 09/09/2018 8:27 AM Injection made incrementally with aspirations every 5 mL.  Performed by: Personally  Anesthesiologist: Cecile Hearing, MD  Additional Notes: No pain on injection. No increased resistance to injection. Injection made in 5cc increments.  Good needle visualization.  Patient tolerated procedure well.

## 2018-09-09 NOTE — Transfer of Care (Signed)
Immediate Anesthesia Transfer of Care Note  Patient: Charles Thompson  Procedure(s) Performed: Left Transmetatarsal Amputation (Left Foot)  Patient Location: PACU  Anesthesia Type:MAC  Level of Consciousness: awake, oriented and patient cooperative  Airway & Oxygen Therapy: Patient Spontanous Breathing and Patient connected to face mask oxygen  Post-op Assessment: Report given to RN and Post -op Vital signs reviewed and stable  Post vital signs: Reviewed  Last Vitals:  Vitals Value Taken Time  BP 106/61 09/09/2018  9:18 AM  Temp    Pulse 86 09/09/2018  9:21 AM  Resp 14 09/09/2018  9:21 AM  SpO2 98 % 09/09/2018  9:21 AM  Vitals shown include unvalidated device data.  Last Pain:  Vitals:   09/09/18 0647  PainSc: 0-No pain         Complications: No apparent anesthesia complications

## 2018-09-09 NOTE — Progress Notes (Signed)
Orthopedic Tech Progress Note Patient Details:  Charles Thompson 1961-05-07 [id number]  Ortho Devices Type of Ortho Device: CAM walker, Postop shoe/boot Ortho Device/Splint Interventions: Application   Post Interventions Patient Tolerated: Well Instructions Provided: Care of device   Saul Fordyce 09/09/2018, 9:54 AM

## 2018-09-09 NOTE — Anesthesia Preprocedure Evaluation (Addendum)
Anesthesia Evaluation  Patient identified by MRN, date of birth, ID band Patient awake    Reviewed: Allergy & Precautions, NPO status , Patient's Chart, lab work & pertinent test results  Airway Mallampati: II  TM Distance: >3 FB Neck ROM: Full    Dental  (+) Teeth Intact, Dental Advisory Given   Pulmonary Current Smoker,    Pulmonary exam normal breath sounds clear to auscultation       Cardiovascular hypertension, Pt. on medications Normal cardiovascular exam Rhythm:Regular Rate:Normal     Neuro/Psych  Neuromuscular disease (Peripheral neuropathy)    GI/Hepatic Neg liver ROS, GERD  Medicated and Controlled,  Endo/Other  diabetes, Type 2, Oral Hypoglycemic Agents, Insulin DependentObesity   Renal/GU negative Renal ROS     Musculoskeletal negative musculoskeletal ROS (+)   Abdominal   Peds  Hematology negative hematology ROS (+)   Anesthesia Other Findings Day of surgery medications reviewed with the patient.  Reproductive/Obstetrics                            Anesthesia Physical Anesthesia Plan  ASA: III  Anesthesia Plan: Regional and MAC   Post-op Pain Management:    Induction: Intravenous  PONV Risk Score and Plan: 0 and Propofol infusion  Airway Management Planned: Simple Face Mask and Natural Airway  Additional Equipment:   Intra-op Plan:   Post-operative Plan:   Informed Consent: I have reviewed the patients History and Physical, chart, labs and discussed the procedure including the risks, benefits and alternatives for the proposed anesthesia with the patient or authorized representative who has indicated his/her understanding and acceptance.   Dental advisory given  Plan Discussed with:   Anesthesia Plan Comments:         Anesthesia Quick Evaluation

## 2018-09-09 NOTE — Op Note (Signed)
09/09/2018  9:14 AM  PATIENT:  Charles Thompson    PRE-OPERATIVE DIAGNOSIS:  Osteomyelitis Left Foot  POST-OPERATIVE DIAGNOSIS:  Same  PROCEDURE:  Left Transmetatarsal Amputation  SURGEON:  Nadara Mustard, MD  PHYSICIAN ASSISTANT:None ANESTHESIA:   General  PREOPERATIVE INDICATIONS:  Burwell Bethel is a  57 y.o. male with a diagnosis of Osteomyelitis Left Foot who failed conservative measures and elected for surgical management.    The risks benefits and alternatives were discussed with the patient preoperatively including but not limited to the risks of infection, bleeding, nerve injury, cardiopulmonary complications, the need for revision surgery, among others, and the patient was willing to proceed.  OPERATIVE IMPLANTS: None  @ENCIMAGES @  OPERATIVE FINDINGS: Healthy viable muscle no abscess at the incision site.  OPERATIVE PROCEDURE: Patient was brought the operating room after undergoing a regional anesthesia.  After adequate levels of anesthesia were obtained patient's left lower extremity was prepped using DuraPrep draped in the sterile field a timeout was called.  A fishmouth incision was made at the MTP joint.  A transmetatarsal amputation was completed with an oscillating saw electrocautery was used for hemostasis the incision was closed using 2-0 nylon.  A sterile dressing was applied patient was taken the PACU in stable condition.   DISCHARGE PLANNING:  Antibiotic duration: Preoperative antibiotics  Weightbearing: Touchdown weightbearing on the left  Pain medication: Patient has a prescription for Percocet at home  Dressing care/ Wound VAC: Follow-up in 1 week to change the dressing  Ambulatory devices: Walker  Discharge to: Home.  Follow-up: In the office 1 week post operative.

## 2018-09-09 NOTE — Anesthesia Procedure Notes (Signed)
Anesthesia Regional Block: Popliteal block   Pre-Anesthetic Checklist: ,, timeout performed, Correct Patient, Correct Site, Correct Laterality, Correct Procedure, Correct Position, site marked, Risks and benefits discussed,  Surgical consent,  Pre-op evaluation,  At surgeon's request and post-op pain management  Laterality: Left  Prep: chloraprep       Needles:  Injection technique: Single-shot  Needle Type: Echogenic Needle     Needle Length: 9cm  Needle Gauge: 21     Additional Needles:   Procedures:,,,, ultrasound used (permanent image in chart),,,,  Narrative:  Start time: 09/09/2018 8:17 AM End time: 09/09/2018 8:22 AM Injection made incrementally with aspirations every 5 mL.  Performed by: Personally  Anesthesiologist: Cecile Hearing, MD  Additional Notes: No pain on injection. No increased resistance to injection. Injection made in 5cc increments.  Good needle visualization.  Patient tolerated procedure well.

## 2018-09-09 NOTE — H&P (Signed)
Charles Thompson is an 57 y.o. male.   Chief Complaint: Ulceration osteomyelitis left forefoot status post first ray amputation. HPI: Patient is a 57 year old gentleman diabetic insensate neuropathy status post left foot first ray amputation 2-1/2 months ago.  Patient has had progressive ulceration and wound breakdown on the left forefoot.  Past Medical History:  Diagnosis Date  . Diabetes mellitus without complication (HCC)   . Hypertension   . Peripheral neuropathy     Past Surgical History:  Procedure Laterality Date  . AMPUTATION Left 06/22/2018   Procedure: LEFT FOOT FIRST RAY AMPUTATION;  Surgeon: Nadara Mustard, MD;  Location: [facility] OR;  Service: Orthopedics;  Laterality: Left;  . EYE SURGERY      History reviewed. No pertinent family history. Social History:  reports that he has been smoking cigarettes. He has been smoking about 0.50 packs per day. He has never used smokeless tobacco. He reports that he does not drink alcohol or use drugs.  Allergies: No Known Allergies  Medications Prior to Admission  Medication Sig Dispense Refill  . amLODipine (NORVASC) 10 MG tablet Take 10 mg by mouth daily.    Marland Kitchen aspirin 81 MG tablet Take 81 mg by mouth daily.    Marland Kitchen atorvastatin (LIPITOR) 10 MG tablet Take 10 mg by mouth daily.    Marland Kitchen gabapentin (NEURONTIN) 300 MG capsule Take 300 mg by mouth 2 (two) times daily.     Marland Kitchen lisinopril-hydrochlorothiazide (PRINZIDE,ZESTORETIC) 20-12.5 MG tablet Take 1 tablet by mouth 2 (two) times daily.     Marland Kitchen omeprazole (PRILOSEC) 20 MG capsule Take 20 mg by mouth daily as needed (heartburn).     . SYNJARDY 12.03-999 MG TABS Take 1 tablet by mouth 2 (two) times daily.     Nathen May SOLOSTAR 300 UNIT/ML SOPN Inject 50-100 Units into the skin 2 (two) times daily.     Marland Kitchen docusate sodium (COLACE) 100 MG capsule Take 1 capsule (100 mg total) by mouth 2 (two) times daily. (Patient not taking: Reported on 09/06/2018) 60 capsule 0  . doxycycline (VIBRAMYCIN) 100 MG capsule Take  1 capsule (100 mg total) by mouth 2 (two) times daily. (Patient not taking: Reported on 09/06/2018) 28 capsule 1  . methocarbamol (ROBAXIN) 500 MG tablet Take 1 tablet (500 mg total) by mouth every 6 (six) hours as needed for muscle spasms. (Patient not taking: Reported on 09/06/2018) 30 tablet 0  . nicotine (NICODERM CQ - DOSED IN MG/24 HOURS) 14 mg/24hr patch Place 1 patch (14 mg total) onto the skin daily. (Patient not taking: Reported on 09/06/2018) 28 patch 0  . nicotine (NICODERM CQ) 21 mg/24hr patch Place 1 patch (21 mg total) onto the skin daily. (Patient not taking: Reported on 09/06/2018) 28 patch 0  . oxyCODONE-acetaminophen (PERCOCET/ROXICET) 5-325 MG tablet Take 1-2 tablets by mouth every 4 (four) hours as needed for severe pain. (Patient not taking: Reported on 09/06/2018) 30 tablet 0  . polyethylene glycol (MIRALAX / GLYCOLAX) packet Take 17 g by mouth daily as needed for mild constipation. (Patient not taking: Reported on 09/06/2018) 14 each 0    No results found for this or any previous visit (from the past 48 hour(s)). No results found.  Review of Systems  All other systems reviewed and are negative.   Blood pressure (!) 161/83, pulse 96, temperature 98.6 F (37 C), resp. rate 20, SpO2 99 %. Physical Exam  Examination patient is alert oriented no adenopathy well-dressed normal affect normal respiratory effort patient has an antalgic gait.  Examination there is a well-healed first ray amputation.  Patient has progressive ulceration and osteomyelitis with cellulitis across the left forefoot. Assessment/Plan Assessment: Diabetic insensate neuropathy with left forefoot ulceration osteomyelitis.  Plan: We will plan for a left transmetatarsal amputation.  Risk and benefits were discussed including persistent infection nonhealing the wound need for additional surgery.  Patient states he understands and wishes to proceed at this time.  Nadara Mustard, MD 09/09/2018, 6:41 AM

## 2018-09-12 ENCOUNTER — Encounter (INDEPENDENT_AMBULATORY_CARE_PROVIDER_SITE_OTHER): Payer: Self-pay | Admitting: Orthopedic Surgery

## 2018-09-12 LAB — GLUCOSE, CAPILLARY: GLUCOSE-CAPILLARY: 170 mg/dL — AB (ref 70–99)

## 2018-09-12 NOTE — Progress Notes (Signed)
Office Visit Note   Patient: Charles Thompson           Date of Birth: 02-10-1961           MRN: [id number] Visit Date: 09/05/2018              Requested by: Gaspar Garbe, MD [address] [place], [place] [address] PCP: Wylene Simmer Adelfa Koh, MD  Chief Complaint  Patient presents with  . Left Foot - Pain, Wound Check      HPI: The patient is a 57 year old male with diabetic insensate neuropathy seen for postoperative follow-up following a left first ray amputation on 06/22/2016.  He comes in today with worsening ulceration of his left forefoot following a first ray amputation.  He has been walking with a regular shoe full weightbearing.  He had significant breakdown in the left forefoot and pain.  Assessment & Plan: Visit Diagnoses:  1. History of amputation of left great toe (HCC)   2. Dehiscence of amputation stump (HCC)     Plan: Plan to taken back to the OR later this week for a left transmetatarsal amputation and the patient is in agreement with the treatment plan.  Follow-Up Instructions: Return in about 2 weeks (around 09/19/2018).   Ortho Exam  Patient is alert, oriented, no adenopathy, well-dressed, normal affect, normal respiratory effort. The left foot has well-healed first ray amputation but there is progressive ulceration and evidence of osteomyelitis with cellulitis across the forefoot.  There is odor and drainage.  Imaging: No results found. No images are attached to the encounter.  Labs: Lab Results  Component Value Date   HGBA1C 6.6 (H) 09/09/2018   HGBA1C 6.6 (H) 06/20/2018   ESRSEDRATE 20 (H) 06/20/2018   CRP 7.3 (H) 06/20/2018   REPTSTATUS 06/25/2018 FINAL 06/20/2018   CULT  06/20/2018    NO GROWTH 5 DAYS Performed at [facility], [address] [place], [place] [address]      Lab Results  Component Value Date   ALBUMIN 3.1 (L) 06/21/2018   ALBUMIN 4.4 04/17/2015   ALBUMIN 4.4 12/18/2007   PREALBUMIN 14.3 (L) 06/20/2018     Body mass index is 33.91 kg/m.  Orders:  No orders of the defined types were placed in this encounter.  No orders of the defined types were placed in this encounter.    Procedures: No procedures performed  Clinical Data: No additional findings.  ROS:  All other systems negative, except as noted in the HPI. Review of Systems  Objective: Vital Signs: Ht 6' (1.829 m)   Wt 250 lb (113.4 kg)   BMI 33.91 kg/m   Specialty Comments:  No specialty comments available.  PMFS History: Patient Active Problem List   Diagnosis Date Noted  . Osteomyelitis of second toe of right foot (HCC)   . Diabetic foot infection (HCC) 06/20/2018  . Diabetic ulcer of left foot associated with type 2 diabetes mellitus (HCC)    Past Medical History:  Diagnosis Date  . Diabetes mellitus without complication (HCC)   . Hypertension   . Peripheral neuropathy     History reviewed. No pertinent family history.  Past Surgical History:  Procedure Laterality Date  . AMPUTATION Left 06/22/2018   Procedure: LEFT FOOT FIRST RAY AMPUTATION;  Surgeon: Nadara Mustard, MD;  Location: [facility] OR;  Service: Orthopedics;  Laterality: Left;  . EYE SURGERY     Social History   Occupational History  . Not on file  Tobacco Use  .  Smoking status: Current Every Day Smoker    Packs/day: 0.50    Types: Cigarettes  . Smokeless tobacco: Never Used  Substance and Sexual Activity  . Alcohol use: No    Alcohol/week: 0.0 standard drinks  . Drug use: Never  . Sexual activity: Not on file

## 2018-09-16 ENCOUNTER — Encounter (INDEPENDENT_AMBULATORY_CARE_PROVIDER_SITE_OTHER): Payer: Self-pay | Admitting: Physician Assistant

## 2018-09-16 ENCOUNTER — Ambulatory Visit (INDEPENDENT_AMBULATORY_CARE_PROVIDER_SITE_OTHER): Payer: Medicare HMO | Admitting: Physician Assistant

## 2018-09-16 VITALS — Ht 72.0 in | Wt 250.0 lb

## 2018-09-16 DIAGNOSIS — E441 Mild protein-calorie malnutrition: Secondary | ICD-10-CM

## 2018-09-16 DIAGNOSIS — E1142 Type 2 diabetes mellitus with diabetic polyneuropathy: Secondary | ICD-10-CM

## 2018-09-16 DIAGNOSIS — Z89432 Acquired absence of left foot: Secondary | ICD-10-CM

## 2018-09-16 NOTE — Progress Notes (Signed)
Office Visit Note   Patient: Charles Thompson           Date of Birth: Jan 20, 1961           MRN: [id number] Visit Date: 09/16/2018              Requested by: Gaspar Garbe, MD [address] [place], [place] [address] PCP: Wylene Simmer Adelfa Koh, MD  Chief Complaint  Patient presents with  . Left Foot - Routine Post Op    TMA of left foot      HPI: The patient is a 57 yo male who is seen for post operative follow up following a left transmetatarsal amputation on 09/09/18.   Assessment & Plan: Visit Diagnoses:  1. Acquired absence of left foot (HCC)   2. Type 2 diabetes mellitus with diabetic polyneuropathy, unspecified whether long term insulin use (HCC)   3. Mild protein malnutrition (HCC)     Plan: Continue non weight bearing as much as possible to the left foot. Dry dressing and Ace wrap to the left foot after showering and using Dial soap and water to the incisional area. He has a walker boot but also has a Darco shoe at home and would recommend using this for now with weight bearing through the heel only when necessary for balance. Also counseled to begin a whey protein supplement at least once daily. Counseled to elevate the left foot as much as possible as well.  Will plan to see back next week for possible suture removal and recheck.   Follow-Up Instructions: Return in about 1 week (around 09/23/2018).   Ortho Exam  Patient is alert, oriented, no adenopathy, well-dressed, normal affect, normal respiratory effort. Left transmetatarsal amputation site has moderate edema and some mild erythema, but no signs of infection or cellulitis. Scant serous appearing drainage.   Imaging: No results found. No images are attached to the encounter.  Labs: Lab Results  Component Value Date   HGBA1C 6.6 (H) 09/09/2018   HGBA1C 6.6 (H) 06/20/2018   ESRSEDRATE 20 (H) 06/20/2018   CRP 7.3 (H) 06/20/2018   REPTSTATUS 06/25/2018 FINAL 06/20/2018   CULT  06/20/2018    NO GROWTH 5  DAYS Performed at [facility], [address], [place], [place] [address]      Lab Results  Component Value Date   ALBUMIN 3.1 (L) 06/21/2018   ALBUMIN 4.4 04/17/2015   ALBUMIN 4.4 12/18/2007   PREALBUMIN 14.3 (L) 06/20/2018    Body mass index is 33.91 kg/m.  Orders:  No orders of the defined types were placed in this encounter.  No orders of the defined types were placed in this encounter.    Procedures: No procedures performed  Clinical Data: No additional findings.  ROS:  All other systems negative, except as noted in the HPI. Review of Systems  Objective: Vital Signs: Ht 6' (1.829 m)   Wt 250 lb (113.4 kg)   BMI 33.91 kg/m   Specialty Comments:  No specialty comments available.  PMFS History: Patient Active Problem List   Diagnosis Date Noted  . Osteomyelitis of second toe of right foot (HCC)   . Diabetic foot infection (HCC) 06/20/2018  . Diabetic ulcer of left foot associated with type 2 diabetes mellitus (HCC)    Past Medical History:  Diagnosis Date  . Diabetes mellitus without complication (HCC)   . Hypertension   . Peripheral neuropathy     History reviewed. No pertinent family history.  Past Surgical History:  Procedure Laterality Date  . AMPUTATION Left 06/22/2018   Procedure: LEFT FOOT FIRST RAY AMPUTATION;  Surgeon: Nadara Mustard, MD;  Location: [facility] OR;  Service: Orthopedics;  Laterality: Left;  . AMPUTATION Left 09/09/2018   Procedure: Left Transmetatarsal Amputation;  Surgeon: Nadara Mustard, MD;  Location: [facility] OR;  Service: Orthopedics;  Laterality: Left;  . EYE SURGERY     Social History   Occupational History  . Not on file  Tobacco Use  . Smoking status: Current Every Day Smoker    Packs/day: 0.50    Types: Cigarettes  . Smokeless tobacco: Never Used  Substance and Sexual Activity  . Alcohol use: No    Alcohol/week: 0.0 standard drinks  . Drug use: Never  . Sexual activity: Not on file

## 2018-09-16 NOTE — Anesthesia Postprocedure Evaluation (Signed)
Anesthesia Post Note  Patient: Charles Thompson  Procedure(s) Performed: Left Transmetatarsal Amputation (Left Foot)     Patient location during evaluation: PACU Anesthesia Type: Regional Level of consciousness: awake and alert Pain management: pain level controlled Vital Signs Assessment: post-procedure vital signs reviewed and stable Respiratory status: spontaneous breathing, nonlabored ventilation, respiratory function stable and patient connected to nasal cannula oxygen Cardiovascular status: stable and blood pressure returned to baseline Postop Assessment: no apparent nausea or vomiting Anesthetic complications: no    Last Vitals:  Vitals:   09/09/18 1003 09/09/18 1025  BP: 119/61 116/60  Pulse: 81 85  Resp: 16 17  Temp:    SpO2: 97% 98%    Last Pain:  Vitals:   09/09/18 1025  PainSc: 0-No pain                 Cecile Hearing

## 2018-09-20 ENCOUNTER — Telehealth (INDEPENDENT_AMBULATORY_CARE_PROVIDER_SITE_OTHER): Payer: Self-pay | Admitting: *Deleted

## 2018-09-20 NOTE — Telephone Encounter (Signed)
Pt called stating had sx week and half ago and is scheduled to come in on Friday Nov 8 for follow up but wanted to know if MD can call in antibiotic for him- states he is having swelling, redness, no pain, no fever,  in L foot. I advised him needed to come in but feels like he needs antibiotic. Please advise.   [facility] [address]

## 2018-09-21 ENCOUNTER — Encounter (INDEPENDENT_AMBULATORY_CARE_PROVIDER_SITE_OTHER): Payer: Self-pay | Admitting: Physician Assistant

## 2018-09-21 ENCOUNTER — Ambulatory Visit (INDEPENDENT_AMBULATORY_CARE_PROVIDER_SITE_OTHER): Payer: Medicare HMO | Admitting: Physician Assistant

## 2018-09-21 VITALS — Ht 72.0 in | Wt 250.0 lb

## 2018-09-21 DIAGNOSIS — Z89432 Acquired absence of left foot: Secondary | ICD-10-CM

## 2018-09-21 DIAGNOSIS — E1142 Type 2 diabetes mellitus with diabetic polyneuropathy: Secondary | ICD-10-CM

## 2018-09-21 DIAGNOSIS — E441 Mild protein-calorie malnutrition: Secondary | ICD-10-CM

## 2018-09-21 MED ORDER — DOXYCYCLINE HYCLATE 100 MG PO CAPS: 100.0000 mg | ORAL_CAPSULE | Freq: Two times a day (BID) | ORAL | 1 refills | Status: DC

## 2018-09-21 NOTE — Telephone Encounter (Signed)
Pt is s/p a transmet on 09/09/18. Called and lm on vm to advise to callback and we can make work in appt for today. Advised to select option 1 for triage and we will work him into the sch today. We cannot give abx without assessment and given that he is a recent surgery needs eval today.

## 2018-09-21 NOTE — Progress Notes (Signed)
Office Visit Note   Patient: Charles Thompson           Date of Birth: [birth year]           MRN: [id number] Visit Date: 09/21/2018              Requested by: Gaspar Garbe, MD [address] [place], [place] [address] PCP: Wylene Simmer Adelfa Koh, MD  Chief Complaint  Patient presents with  . Left Foot - Routine Post Op    09/09/18 left transmet amputation       HPI: The patient is a 57 yo male who is seen for post operative follow up following left transmetatarsal amputation on 09/09/18. He feels he is developing infection in the foot with increased swelling and some clear to yellow drainage. He has been walking in a fracture boot, but not really trying to stay off the foot. He did start some protein supplement.   Assessment & Plan: Visit Diagnoses:  1. Acquired absence of left foot (HCC)   2. Type 2 diabetes mellitus with diabetic polyneuropathy, unspecified whether long term insulin use (HCC)   3. Mild protein malnutrition (HCC)     Plan: Applied vive silver compression sock, XL to the left foot and instructed the patient to elevate the left foot more and to use the compression sock daily. We discussed non weight bearing and he reports he does have a walker to use at home. Wash foot with Dial soap and water daily.  Will start Doxycycline 100 mg BID though doubtful of infection at this point.  Follow up next Monday or sooner if difficulties in the interim.   Follow-Up Instructions: Return in about 1 week (around 09/28/2018).   Ortho Exam  Patient is alert, oriented, no adenopathy, well-dressed, normal affect, normal respiratory effort. Left foot transmetatarsal amputation with massive edema of the residual foot. No signs of infection. Sutures intact. No signs of infection or cellulitis. Minimal serous appearing drainage without odor.   Imaging: No results found. No images are attached to the encounter.  Labs: Lab Results  Component Value Date   HGBA1C 6.6 (H) 09/09/2018    HGBA1C 6.6 (H) 06/20/2018   ESRSEDRATE 20 (H) 06/20/2018   CRP 7.3 (H) 06/20/2018   REPTSTATUS 06/25/2018 FINAL 06/20/2018   CULT  06/20/2018    NO GROWTH 5 DAYS Performed at [facility], [address] [place], [place] [address]      Lab Results  Component Value Date   ALBUMIN 3.1 (L) 06/21/2018   ALBUMIN 4.4 04/17/2015   ALBUMIN 4.4 12/18/2007   PREALBUMIN 14.3 (L) 06/20/2018    Body mass index is 33.91 kg/m.  Orders:  No orders of the defined types were placed in this encounter.  No orders of the defined types were placed in this encounter.    Procedures: No procedures performed  Clinical Data: No additional findings.  ROS:  All other systems negative, except as noted in the HPI. Review of Systems  Objective: Vital Signs: Ht 6' (1.829 m)   Wt 250 lb (113.4 kg)   BMI 33.91 kg/m   Specialty Comments:  No specialty comments available.  PMFS History: Patient Active Problem List   Diagnosis Date Noted  . Osteomyelitis of second toe of right foot (HCC)   . Diabetic foot infection (HCC) 06/20/2018  . Diabetic ulcer of left foot associated with type 2 diabetes mellitus (HCC)    Past Medical History:  Diagnosis Date  . Diabetes mellitus without complication (  HCC)   . Hypertension   . Peripheral neuropathy     History reviewed. No pertinent family history.  Past Surgical History:  Procedure Laterality Date  . AMPUTATION Left 06/22/2018   Procedure: LEFT FOOT FIRST RAY AMPUTATION;  Surgeon: Nadara Mustard, MD;  Location: [facility] OR;  Service: Orthopedics;  Laterality: Left;  . AMPUTATION Left 09/09/2018   Procedure: Left Transmetatarsal Amputation;  Surgeon: Nadara Mustard, MD;  Location: [facility] OR;  Service: Orthopedics;  Laterality: Left;  . EYE SURGERY     Social History   Occupational History  . Not on file  Tobacco Use  . Smoking status: Current Every Day Smoker    Packs/day: 0.50    Types: Cigarettes  . Smokeless tobacco: Never Used    Substance and Sexual Activity  . Alcohol use: No    Alcohol/week: 0.0 standard drinks  . Drug use: Never  . Sexual activity: Not on file

## 2018-09-23 ENCOUNTER — Ambulatory Visit (INDEPENDENT_AMBULATORY_CARE_PROVIDER_SITE_OTHER): Payer: Medicare HMO | Admitting: Physician Assistant

## 2018-09-26 ENCOUNTER — Ambulatory Visit (INDEPENDENT_AMBULATORY_CARE_PROVIDER_SITE_OTHER): Payer: Medicare HMO | Admitting: Physician Assistant

## 2018-09-27 ENCOUNTER — Ambulatory Visit (INDEPENDENT_AMBULATORY_CARE_PROVIDER_SITE_OTHER): Payer: Medicare HMO | Admitting: Physician Assistant

## 2018-09-27 ENCOUNTER — Encounter (INDEPENDENT_AMBULATORY_CARE_PROVIDER_SITE_OTHER): Payer: Self-pay | Admitting: Physician Assistant

## 2018-09-27 VITALS — Ht 72.0 in | Wt 250.0 lb

## 2018-09-27 DIAGNOSIS — Z89432 Acquired absence of left foot: Secondary | ICD-10-CM

## 2018-09-27 DIAGNOSIS — Z720 Tobacco use: Secondary | ICD-10-CM

## 2018-09-27 DIAGNOSIS — E1142 Type 2 diabetes mellitus with diabetic polyneuropathy: Secondary | ICD-10-CM

## 2018-09-27 DIAGNOSIS — E441 Mild protein-calorie malnutrition: Secondary | ICD-10-CM

## 2018-09-28 ENCOUNTER — Encounter (INDEPENDENT_AMBULATORY_CARE_PROVIDER_SITE_OTHER): Payer: Self-pay | Admitting: Physician Assistant

## 2018-09-28 NOTE — Progress Notes (Signed)
Office Visit Note   Patient: Charles Thompson           Date of Birth: [birth year]           MRN: [id number] Visit Date: 09/27/2018              Requested by: Gaspar Garbe, MD [address] [place], [place] [address] PCP: Wylene Simmer Adelfa Koh, MD  Chief Complaint  Patient presents with  . Left Foot - Routine Post Op    Left TMA      HPI: The patient is a 57 yo male who is seen for post operative follow up following left transmetatarsal amputation on 09/09/18. He has been walking a lot more and endorses more weight bearing on the foot over the past couple of days due to the nice weather. He notes more swelling and redness but is trying to wear the vive silver compression sock. He is on Doxycycline. He continues to smoke cigarettes.   Assessment & Plan: Visit Diagnoses:  1. Acquired absence of left foot (HCC)   2. Type 2 diabetes mellitus with diabetic polyneuropathy, unspecified whether long term insulin use (HCC)   3. Mild protein malnutrition (HCC)   4. Current tobacco use     Plan: Counseled patient avoid weight bearing over the left foot whenever possible. Also counseled to continue the Vive silver compression sock and elevate the left foot as much as possible over the next week. We did remove a portion of the sutures today, but will leave the central portion in situ as they are under significant tension. Follow up in 1 week.   Follow-Up Instructions: Return in about 1 week (around 10/04/2018).   Ortho Exam  Patient is alert, oriented, no adenopathy, well-dressed, normal affect, normal respiratory effort. Left foot transmetatarsal amputation site is intact, but sutures under tension. A portion of the sutures were removed today and central portion where there is more tension were left in place. There is minimal serosanguinous drainage but no cellulitis or signs of infection.   Imaging: No results found. No images are attached to the encounter.  Labs: Lab Results    Component Value Date   HGBA1C 6.6 (H) 09/09/2018   HGBA1C 6.6 (H) 06/20/2018   ESRSEDRATE 20 (H) 06/20/2018   CRP 7.3 (H) 06/20/2018   REPTSTATUS 06/25/2018 FINAL 06/20/2018   CULT  06/20/2018    NO GROWTH 5 DAYS Performed at [facility], [address] [place], [place] [address]      Lab Results  Component Value Date   ALBUMIN 3.1 (L) 06/21/2018   ALBUMIN 4.4 04/17/2015   ALBUMIN 4.4 12/18/2007   PREALBUMIN 14.3 (L) 06/20/2018    Body mass index is 33.91 kg/m.  Orders:  No orders of the defined types were placed in this encounter.  No orders of the defined types were placed in this encounter.    Procedures: No procedures performed  Clinical Data: No additional findings.  ROS:  All other systems negative, except as noted in the HPI. Review of Systems  Objective: Vital Signs: Ht 6' (1.829 m)   Wt 250 lb (113.4 kg)   BMI 33.91 kg/m   Specialty Comments:  No specialty comments available.  PMFS History: Patient Active Problem List   Diagnosis Date Noted  . Osteomyelitis of second toe of right foot (HCC)   . Diabetic foot infection (HCC) 06/20/2018  . Diabetic ulcer of left foot associated with type 2 diabetes mellitus (HCC)    Past  Medical History:  Diagnosis Date  . Diabetes mellitus without complication (HCC)   . Hypertension   . Peripheral neuropathy     History reviewed. No pertinent family history.  Past Surgical History:  Procedure Laterality Date  . AMPUTATION Left 06/22/2018   Procedure: LEFT FOOT FIRST RAY AMPUTATION;  Surgeon: Nadara Mustarduda, Marcus V, MD;  [facility] OR;  Service: Orthopedics;  Laterality: Left;  . AMPUTATION Left 09/09/2018   Procedure: Left Transmetatarsal Amputation;  Surgeon: Nadara Mustarduda, Marcus V, MD;  Location[facility];  Service: Orthopedics;  Laterality: Left;  . EYE SURGERY     Social History   Occupational History  . Not on file  Tobacco Use  . Smoking status: Current Every Day Smoker    Packs/day: 0.50    Types:  Cigarettes  . Smokeless tobacco: Never Used  Substance and Sexual Activity  . Alcohol use: No    Alcohol/week: 0.0 standard drinks  . Drug use: Never  . Sexual activity: Not on file

## 2018-09-29 DIAGNOSIS — F329 Major depressive disorder, single episode, unspecified: Secondary | ICD-10-CM | POA: Diagnosis not present

## 2018-09-29 DIAGNOSIS — F419 Anxiety disorder, unspecified: Secondary | ICD-10-CM | POA: Diagnosis not present

## 2018-10-03 ENCOUNTER — Emergency Department (HOSPITAL_COMMUNITY)
Admission: EM | Admit: 2018-10-03 | Discharge: 2018-10-03 | Disposition: A | Discharge status: Home / Self Care | Payer: Medicare HMO | Attending: Emergency Medicine | Admitting: Emergency Medicine

## 2018-10-03 ENCOUNTER — Encounter (HOSPITAL_COMMUNITY): Payer: Self-pay | Admitting: Emergency Medicine

## 2018-10-03 ENCOUNTER — Other Ambulatory Visit: Payer: Self-pay

## 2018-10-03 DIAGNOSIS — E119 Type 2 diabetes mellitus without complications: Secondary | ICD-10-CM | POA: Diagnosis not present

## 2018-10-03 DIAGNOSIS — F329 Major depressive disorder, single episode, unspecified: Secondary | ICD-10-CM | POA: Diagnosis not present

## 2018-10-03 DIAGNOSIS — F419 Anxiety disorder, unspecified: Secondary | ICD-10-CM | POA: Diagnosis not present

## 2018-10-03 DIAGNOSIS — Z79899 Other long term (current) drug therapy: Secondary | ICD-10-CM | POA: Diagnosis not present

## 2018-10-03 DIAGNOSIS — F32A Depression, unspecified: Secondary | ICD-10-CM

## 2018-10-03 DIAGNOSIS — F332 Major depressive disorder, recurrent severe without psychotic features: Secondary | ICD-10-CM | POA: Diagnosis not present

## 2018-10-03 DIAGNOSIS — I10 Essential (primary) hypertension: Secondary | ICD-10-CM | POA: Diagnosis not present

## 2018-10-03 DIAGNOSIS — F17210 Nicotine dependence, cigarettes, uncomplicated: Secondary | ICD-10-CM | POA: Diagnosis not present

## 2018-10-03 LAB — CBC WITH DIFFERENTIAL/PLATELET
ABS IMMATURE GRANULOCYTES: 0.02 10*3/uL (ref 0.00–0.07)
BASOS PCT: 1 %
Basophils Absolute: 0 10*3/uL (ref 0.0–0.1)
EOS ABS: 0.1 10*3/uL (ref 0.0–0.5)
Eosinophils Relative: 1 %
HEMATOCRIT: 49.8 % (ref 39.0–52.0)
Hemoglobin: 16.9 g/dL (ref 13.0–17.0)
Immature Granulocytes: 0 %
LYMPHS ABS: 1.6 10*3/uL (ref 0.7–4.0)
Lymphocytes Relative: 20 %
MCH: 28.6 pg (ref 26.0–34.0)
MCHC: 33.9 g/dL (ref 30.0–36.0)
MCV: 84.3 fL (ref 80.0–100.0)
MONO ABS: 0.4 10*3/uL (ref 0.1–1.0)
Monocytes Relative: 5 %
NEUTROS ABS: 5.7 10*3/uL (ref 1.7–7.7)
Neutrophils Relative %: 73 %
PLATELETS: 238 10*3/uL (ref 150–400)
RBC: 5.91 MIL/uL — ABNORMAL HIGH (ref 4.22–5.81)
RDW: 12.4 % (ref 11.5–15.5)
WBC: 7.8 10*3/uL (ref 4.0–10.5)
nRBC: 0 % (ref 0.0–0.2)

## 2018-10-03 LAB — RAPID URINE DRUG SCREEN, HOSP PERFORMED
Amphetamines: NOT DETECTED
BARBITURATES: NOT DETECTED
BENZODIAZEPINES: POSITIVE — AB
COCAINE: NOT DETECTED
Opiates: NOT DETECTED
TETRAHYDROCANNABINOL: POSITIVE — AB

## 2018-10-03 LAB — COMPREHENSIVE METABOLIC PANEL
ALK PHOS: 120 U/L (ref 38–126)
ALT: 23 U/L (ref 0–44)
AST: 17 U/L (ref 15–41)
Albumin: 4.4 g/dL (ref 3.5–5.0)
Anion gap: 12 (ref 5–15)
BILIRUBIN TOTAL: 1.3 mg/dL — AB (ref 0.3–1.2)
BUN: 13 mg/dL (ref 6–20)
CALCIUM: 9.5 mg/dL (ref 8.9–10.3)
CO2: 23 mmol/L (ref 22–32)
CREATININE: 0.91 mg/dL (ref 0.61–1.24)
Chloride: 102 mmol/L (ref 98–111)
GFR calc Af Amer: >60 mL/min (ref >60)
GFR calc non Af Amer: >60 mL/min (ref >60)
Glucose, Bld: 267 mg/dL — ABNORMAL HIGH (ref 70–99)
Potassium: 4 mmol/L (ref 3.5–5.1)
SODIUM: 137 mmol/L (ref 135–145)
TOTAL PROTEIN: 7.8 g/dL (ref 6.5–8.1)

## 2018-10-03 LAB — ETHANOL: Alcohol, Ethyl (B): <10 mg/dL (ref <10)

## 2018-10-03 MED ORDER — ACETAMINOPHEN 325 MG PO TABS
650.0000 mg | ORAL_TABLET | Freq: Once | ORAL | Status: AC
  Administered 2018-10-03: 650 mg via ORAL
  Filled 2018-10-03: qty 2

## 2018-10-03 MED ORDER — NICOTINE 21 MG/24HR TD PT24
21.0000 mg | MEDICATED_PATCH | Freq: Once | TRANSDERMAL | Status: DC
  Administered 2018-10-03: 21 mg via TRANSDERMAL
  Filled 2018-10-03: qty 1

## 2018-10-03 NOTE — ED Triage Notes (Signed)
Patient reports recent panic attacks and anxiety with depression relating to "lonliness." Denies SI/HI/A/V/H.

## 2018-10-03 NOTE — BH Assessment (Addendum)
Tele Assessment Note   Patient Name: Charles Thompson MRN: [id number] Referring Physician: Dr. Tilden Fossa, MD Location of Patient: [facility] Location of Provider: Behavioral Health TTS Department  Charles Thompson is a 57 y.o. male who came to [facility] ED due to hopeless feelings he has been experiencing; he shares his PCP encouraged him to come due to these ongoing feelings. Pt shares that he lost his mother, whom he lived with and cared for, in April and that it was recently necessary to "cut part of [his] foot off" (amputate the toes from his left foot). Pt shares he was hoping to talk to someone in psychiatry so he could be prescribed medication to assist with the feelings he has been experiencing. Pt shares he has had SI, though he expresses they are not any different than the ones anyone else has. He denies any attempts, plans, or hospitalizations for mental health reasons. Pt denies HI, AVH, or NSSIB.   Pt shares he has a pistol and a rifle in his home. He denies any involvement in the legal system and denies any SA. He states he currently lives alone and that he is single. He states that he does not work, as he is on disability.  Pt denies any family history with SI, MH, or SA. He denies any abuse inflicted onto him. He staes he has never had a therapist nor a psychiatrist. Pt shares he has been staying up through half of the night and that he has been sleeping into the day; he believes he gets 6-7 hours, though this is not consistent, as some nights he's unable to sleep at all. Pt states his appetite has not been good as of late, as he hasn't felt like eating; pt states this has resulted in him losing 30lbs.  Pt is oriented x4. His recent and remote memory is intact. Pt was cooperative, yet irritable, throughout his assessment, as he stated that he did not realize that coming to the ED for these reasons would result in this process. Pt's insight, judgement, and impulse control is  impaired at this time.   Diagnosis: F33.2, Major depressive disorder, Recurrent episode, Severe   Past Medical History:  Past Medical History:  Diagnosis Date  . Diabetes mellitus without complication (HCC)   . Hypertension   . Peripheral neuropathy     Past Surgical History:  Procedure Laterality Date  . AMPUTATION Left 06/22/2018   Procedure: LEFT FOOT FIRST RAY AMPUTATION;  Surgeon: Nadara Mustard, MD;  Location: [facility] OR;  Service: Orthopedics;  Laterality: Left;  . AMPUTATION Left 09/09/2018   Procedure: Left Transmetatarsal Amputation;  Surgeon: Nadara Mustard, MD;  Location: [facility] OR;  Service: Orthopedics;  Laterality: Left;  . EYE SURGERY      Family History: No family history on file.  Social History:  reports that he has been smoking cigarettes. He has been smoking about 0.50 packs per day. He has never used smokeless tobacco. He reports that he does not drink alcohol or use drugs.  Additional Social History:  Alcohol / Drug Use Pain Medications: Please see MAR Prescriptions: Please see MAR Over the Counter: Please see MAR History of alcohol / drug use?: No history of alcohol / drug abuse Longest period of sobriety (when/how long): N/A  CIWA: CIWA-Ar BP: (!) 184/98 Pulse Rate: (!) 108 COWS:    Allergies: No Known Allergies  Home Medications:  (Not in a hospital admission)  OB/GYN Status:  No LMP for male patient.  General Assessment Data Assessment unable to be completed: Yes Reason for not completing assessment: Multiple [facility] Assessments ordered simultaneously Location of Assessment: [facility] ED TTS Assessment: In system Is this a Tele or Face-to-Face Assessment?: Face-to-Face Is this an Initial Assessment or a Re-assessment for this encounter?: Initial Assessment Patient Accompanied by:: N/A Language Other than English: No Living Arrangements: Other (Comment)(Pt lives independently in his home) What gender do you identify as?: Male Marital status: Single Maiden  name: Maisie Fushomas Pregnancy Status: No Living Arrangements: Alone Can pt return to current living arrangement?: Yes Admission Status: Voluntary Is patient capable of signing voluntary admission?: Yes Referral Source: Self/Family/Friend Insurance type: [facility] Medicaid     Crisis Care Plan Living Arrangements: Alone Legal Guardian: (N/A) Name of Psychiatrist: None Name of Therapist: None  Education Status Is patient currently in school?: No Is the patient employed, unemployed or receiving disability?: Receiving disability income  Risk to self with the past 6 months Suicidal Ideation: Yes-Currently Present Has patient been a risk to self within the past 6 months prior to admission? : No Suicidal Intent: No Has patient had any suicidal intent within the past 6 months prior to admission? : No Is patient at risk for suicide?: No Suicidal Plan?: No Has patient had any suicidal plan within the past 6 months prior to admission? : No Access to Means: Yes(Pt reports he has a rifle and a pistol) Specify Access to Suicidal Means: Pt states he has access to a pistol and a rifle What has been your use of drugs/alcohol within the last 12 months?: Pt denies Previous Attempts/Gestures: No How many times?: 0 Other Self Harm Risks: None noted Triggers for Past Attempts: None known Intentional Self Injurious Behavior: None Family Suicide History: No Recent stressful life event(s): Loss (Comment), Recent negative physical changes(Pt's mother died, the toes on his left foot were amputated) Persecutory voices/beliefs?: No Depression: Yes Depression Symptoms: Despondent, Insomnia, Isolating, Fatigue, Loss of interest in usual pleasures, Feeling worthless/self pity Substance abuse history and/or treatment for substance abuse?: No Suicide prevention information given to non-admitted patients: Yes  Risk to Others within the past 6 months Homicidal Ideation: No Does patient have any lifetime risk of  violence toward others beyond the six months prior to admission? : No Thoughts of Harm to Others: No Current Homicidal Intent: No Current Homicidal Plan: No Access to Homicidal Means: No Identified Victim: None noted History of harm to others?: No Assessment of Violence: On admission Violent Behavior Description: None noted Does patient have access to weapons?: Yes (Comment)(Pt states he owns a pistol and a rifle) Criminal Charges Pending?: No Does patient have a court date: No Is patient on probation?: No  Psychosis Hallucinations: None noted Delusions: None noted  Mental Status Report Appearance/Hygiene: In scrubs, Unremarkable Eye Contact: Fair Motor Activity: Freedom of movement Speech: Soft, Logical/coherent, Other (Comment)(Pt was frustrated) Level of Consciousness: Alert, Irritable Mood: Irritable, Depressed Affect: Depressed, Sullen Anxiety Level: Minimal Thought Processes: Relevant, Coherent Judgement: Unimpaired Orientation: Person, Situation, Place, Time Obsessive Compulsive Thoughts/Behaviors: Moderate  Cognitive Functioning Concentration: Decreased Memory: Recent Intact, Remote Intact Is patient IDD: No Insight: Fair Impulse Control: Fair Appetite: Poor Have you had any weight changes? : Loss Amount of the weight change? (lbs): 30 lbs Sleep: Decreased Total Hours of Sleep: 7(Pt states he stays up 1/2 the night & sleeps during the day) Vegetative Symptoms: None  ADLScreening [facility](B[facility] Assessment Services) Patient's cognitive ability adequate to safely complete daily activities?: Yes Patient able to express need for assistance  with ADLs?: Yes Independently performs ADLs?: Yes (appropriate for developmental age)  Prior Inpatient Therapy Prior Inpatient Therapy: No  Prior Outpatient Therapy Prior Outpatient Therapy: No Does patient have an ACCT team?: No Does patient have Intensive In-House Services?  : No Does patient have [facility] services? : No Does  patient have P4CC services?: No  ADL Screening (condition at time of admission) Patient's cognitive ability adequate to safely complete daily activities?: Yes Is the patient deaf or have difficulty hearing?: No Does the patient have difficulty seeing, even when wearing glasses/contacts?: No Does the patient have difficulty concentrating, remembering, or making decisions?: No Patient able to express need for assistance with ADLs?: Yes Does the patient have difficulty dressing or bathing?: No Independently performs ADLs?: Yes (appropriate for developmental age) Does the patient have difficulty walking or climbing stairs?: Yes Weakness of Legs: Left Weakness of Arms/Hands: None     Therapy Consults (therapy consults require a physician order) PT Evaluation Needed: Yes (Comment)(Pt recently had the toes of his left foot amputated) OT Evalulation Needed: No SLP Evaluation Needed: No Abuse/Neglect Assessment (Assessment to be complete while patient is alone) Abuse/Neglect Assessment Can Be Completed: Yes Physical Abuse: Denies Verbal Abuse: Denies Sexual Abuse: Denies Exploitation of patient/patient's resources: Denies Self-Neglect: Denies Values / Beliefs Cultural Requests During Hospitalization: None Spiritual Requests During Hospitalization: None Consults Spiritual Care Consult Needed: No Social Work Consult Needed: No Merchant navy officer (For Healthcare) Does Patient Have a Medical Advance Directive?: No Type of Advance Directive: Healthcare Power of [place], Living will Would patient like information on creating a medical advance directive?: No - Patient declined       Disposition: Nira Conn NP reviewed pt's chart and information and determined pt does not meet criteria for inpatient hospitalization. This information was provided to pt's provider, Dr. Tilden Fossa, MD, at 2118. Pt was provided information for outpatient services, information for suicide prevention hotlines  and mobile crisis, and information regarding the anti-depressant he was prescribed previously, as per his chart.  Disposition Initial Assessment Completed for this Encounter: Yes Patient referred to: Other (Comment)(Pt will be discharged with outpatient services information)  This service was provided via telemedicine using a 2-way, interactive audio and video technology.  Names of all persons participating in this telemedicine service and their role in this encounter. Name: Dyllon Henken Role: Patient  Name: Duard Brady Role: Clinician    Ralph Dowdy 10/03/2018 9:27 PM

## 2018-10-03 NOTE — ED Provider Notes (Signed)
[facility]-EMERGENCY DEPT Provider Note   CSN: [id number] Arrival date & time: 10/03/18  1633     History   Chief Complaint Chief Complaint  Patient presents with  . Depression  . Anxiety    HPI Charles Thompson is a 57 y.o. male.  The history is provided by the patient. No language interpreter was used.  Depression   Anxiety    Charles Thompson is a 57 y.o. male who presents to the Emergency Department complaining of depression, anxiety. Resents to the emergency department for feelings of depression and anxiety that is been ongoing for the last several months but significantly worsened since the time change. He feels of panic attacks with poor sleep and poor appetite. He has associated weight loss. He does have suicidal ideation but does not have a plan. He saw his PCP yesterday and was prescribed medication but he is afraid to take it because it states that it could worsen suicidal thoughts. He currently lives alone. His mother passed away in March 16, 2023 of this year. He has intermittent compliance with his medications. He has a history of diabetes, hypertension as well as osteomyelitis of the foot. Past Medical History:  Diagnosis Date  . Diabetes mellitus without complication (HCC)   . Hypertension   . Peripheral neuropathy     Patient Active Problem List   Diagnosis Date Noted  . Osteomyelitis of second toe of right foot (HCC)   . Diabetic foot infection (HCC) 06/20/2018  . Diabetic ulcer of left foot associated with type 2 diabetes mellitus [place])     Past Surgical History:  Procedure Laterality Date  . AMPUTATION Left 06/22/2018   Procedure: LEFT FOOT FIRST RAY AMPUTATION;  Surgeon: Nadara Mustard, MD;  Location: [facility] OR;  Service: Orthopedics;  Laterality: Left;  . AMPUTATION Left 09/09/2018   Procedure: Left Transmetatarsal Amputation;  Surgeon: Nadara Mustard, MD;  Location: [facility] OR;  Service: Orthopedics;  Laterality: Left;  . EYE SURGERY           Home Medications    Prior to Admission medications   Medication Sig Start Date End Date Taking? Authorizing Provider  amLODipine (NORVASC) 10 MG tablet Take 10 mg by mouth daily. 04/04/15  Yes [provider]  aspirin 81 MG tablet Take 81 mg by mouth daily.   Yes [provider]  atorvastatin (LIPITOR) 10 MG tablet Take 10 mg by mouth daily. 04/04/15  Yes [provider]  doxycycline (VIBRAMYCIN) 100 MG capsule Take 1 capsule (100 mg total) by mouth 2 (two) times daily. 09/21/18  Yes Rayburn, Fanny Bien, PA-C  gabapentin (NEURONTIN) 300 MG capsule Take 300 mg by mouth 2 (two) times daily.  10/08/15  Yes [provider]  lisinopril-hydrochlorothiazide (PRINZIDE,ZESTORETIC) 20-12.5 MG tablet Take 1 tablet by mouth 2 (two) times daily.  06/17/18  Yes [provider]  sertraline (ZOLOFT) 50 MG tablet Take 50 mg by mouth daily.  09/30/18  Yes [provider]  SYNJARDY 12.03-999 MG TABS Take 1 tablet by mouth 2 (two) times daily.  06/17/18  Yes [provider]  TOUJEO MAX SOLOSTAR 300 UNIT/ML SOPN Inject 70 Units into the skin 2 (two) times daily.  07/27/18  Yes [provider]  traZODone (DESYREL) 50 MG tablet Take 50 mg by mouth at bedtime as needed for sleep.  09/30/18  Yes [provider]  omeprazole (PRILOSEC) 20 MG capsule Take 20 mg by mouth daily as needed (heartburn).  02/08/15   [provider]    Family History No family history on file.  Social History Social History   Tobacco Use  . Smoking status: Current Every Day Smoker    Packs/day: 0.50    Types: Cigarettes  . Smokeless tobacco: Never Used  Substance Use Topics  . Alcohol use: No    Alcohol/week: 0.0 standard drinks  . Drug use: Never     Allergies   Patient has no known allergies.   Review of Systems Review of Systems  Psychiatric/Behavioral: Positive for depression.  All other systems reviewed and are  negative.    Physical Exam Updated Vital Signs BP (!) 184/98 (BP Location: Left Arm)   Pulse (!) 108   Temp 98 F (36.7 C) (Oral)   Resp 20   Ht 6' (1.829 m)   Wt 111.1 kg   SpO2 100%   BMI 33.23 kg/m   Physical Exam  Constitutional: He is oriented to person, place, and time. He appears well-developed and well-nourished.  HENT:  Head: Normocephalic and atraumatic.  Cardiovascular: Normal rate and regular rhythm.  No murmur heard. Pulmonary/Chest: Effort normal and breath sounds normal. No respiratory distress.  Abdominal: Soft. There is no tenderness. There is no rebound and no guarding.  Musculoskeletal: He exhibits no edema or tenderness.  Neurological: He is alert and oriented to person, place, and time.  Skin: Skin is warm and dry.  Psychiatric:  Depressed mood and affect  Nursing note and vitals reviewed.    ED Treatments / Results  Labs (all labs ordered are listed, but only abnormal results are displayed) Labs Reviewed  COMPREHENSIVE METABOLIC PANEL - Abnormal; Notable for the following components:      Result Value   Glucose, Bld 267 (*)    Total Bilirubin 1.3 (*)    All other components within normal limits  CBC WITH DIFFERENTIAL/PLATELET - Abnormal; Notable for the following components:   RBC 5.91 (*)    All other components within normal limits  RAPID URINE DRUG SCREEN, HOSP PERFORMED - Abnormal; Notable for the following components:   Benzodiazepines POSITIVE (*)    Tetrahydrocannabinol POSITIVE (*)    All other components within normal limits  ETHANOL    EKG None  Radiology No results found.  Procedures Procedures (including critical care time)  Medications Ordered in ED Medications  nicotine (NICODERM CQ - dosed in mg/24 hours) patch 21 mg (21 mg Transdermal Patch Applied 10/03/18 1848)  acetaminophen (TYLENOL) tablet 650 mg (650 mg Oral Given 10/03/18 1957)     Initial Impression / Assessment and Plan / ED Course  I have reviewed  the triage vital signs and the nursing notes.  Pertinent labs & imaging results that were available during my care of the patient were reviewed by me and considered in my medical decision making (see chart for details).     Patient here for evaluation of feelings of depression, no active suicidal ideation. He does live alone. He has been evaluated by TTS with plan to discharge home with outpatient resources. Discussed with patient home care and return precautions.  Final Clinical Impressions(s) / ED Diagnoses   Final diagnoses:  Depression, unspecified depression type    ED Discharge Orders    None       Tilden Fossaees, Yovan Leeman, MD 10/04/18 0003

## 2018-10-03 NOTE — ED Notes (Signed)
Pt was not able to urinate for urine drug screening. Pt will try again

## 2018-10-03 NOTE — ED Notes (Signed)
ED Provider at bedside. 

## 2018-10-03 NOTE — ED Notes (Signed)
Left  foot cleansed with Sterile saline, dried and Telfa Pads placed over suture site and covered with tape.  Pt tolerated well.

## 2018-10-03 NOTE — ED Notes (Signed)
Pt A&O x 3, no distress noted, calm & cooperative, irritable.  Pt being assessed by TTS, pt reports he needs his meds adjusted.  Pt is a known Diabetic, had all toes removed on the Left foot x 2 weeks ago.  Sutures in place to site with dried blood noted to site.  Dr Madilyn Hookees at bedside to eval pts Left foot.  Pt denies SI or HI, no AVH.  Monitoring for safety, Q 15 min checks in effect.

## 2018-10-03 NOTE — Discharge Instructions (Signed)
Follow-up with your doctor, call tomorrow morning to schedule follow-up appointment.

## 2018-10-04 ENCOUNTER — Ambulatory Visit (INDEPENDENT_AMBULATORY_CARE_PROVIDER_SITE_OTHER): Payer: Medicare HMO | Admitting: Physician Assistant

## 2018-10-04 ENCOUNTER — Encounter (HOSPITAL_COMMUNITY): Payer: Self-pay | Admitting: Registered Nurse

## 2018-10-04 ENCOUNTER — Telehealth (HOSPITAL_COMMUNITY): Payer: Self-pay | Admitting: Psychiatry

## 2018-10-04 ENCOUNTER — Ambulatory Visit (HOSPITAL_COMMUNITY)
Admission: RE | Admit: 2018-10-04 | Discharge: 2018-10-04 | Disposition: A | Discharge status: Home / Self Care | Payer: Medicare HMO | Attending: Psychiatry | Admitting: Psychiatry

## 2018-10-04 DIAGNOSIS — F17210 Nicotine dependence, cigarettes, uncomplicated: Secondary | ICD-10-CM | POA: Diagnosis not present

## 2018-10-04 DIAGNOSIS — I10 Essential (primary) hypertension: Secondary | ICD-10-CM | POA: Diagnosis not present

## 2018-10-04 DIAGNOSIS — E1142 Type 2 diabetes mellitus with diabetic polyneuropathy: Secondary | ICD-10-CM | POA: Diagnosis not present

## 2018-10-04 DIAGNOSIS — Z89412 Acquired absence of left great toe: Secondary | ICD-10-CM | POA: Insufficient documentation

## 2018-10-04 DIAGNOSIS — F322 Major depressive disorder, single episode, severe without psychotic features: Secondary | ICD-10-CM | POA: Diagnosis not present

## 2018-10-04 NOTE — Telephone Encounter (Signed)
D:  TTS called stating they had a walk-in who would benefit from MH-IOP. A:  Start MH-IOP tomorrow at 8:30 a.m.  Instructed to bring insurance card.

## 2018-10-04 NOTE — BH Assessment (Addendum)
Assessment Note  Charles Thompson is an 57 y.o. male presenting voluntarily to [facility] for a walk in assessment. Patient is accompanied by his sister, Corrie Dandy, who is present during assessment at the request of patient. Patient reviewed that he just "doesn't feel right. I'm just hopeless." Patient stated he accessed [facility] ED on 10/03/18 for an assessment due to depression but "they thought I was crazy and I'm not." Patient was discharged due to not meet criteria for in patient hospitalization. Patient stated that he has been having difficulty sleeping and has lost his appetite. Patient is diabetic and had a partial foot amputation 3 weeks ago a result of diabetic ulcers. Patient asserted that he is not taking medications appropriately or checking his blood sugar. Patient stated that he has been experiencing depression since the loss of his mother in April to a heart attack. Prior to her death he lived with her and cared for her for 35 years. Patient has never been married and does not have any children and states that he is lonely. Patient's recent amputation has also prevented him from engaging in activities he once found pleasurable, such as playing golf. Patient admits to passive suicidal thoughts without plan or intent. Patient has never participated in therapy or been seen by a psychiatrist. He stated that his primary care physician prescribed him an anti-depressant but he cannot recall what it is and says he only took it a couple times. Patient stated that he wakes up in the middle of the night feeling like "I'm having a panic attack." He states that he typically stays awake all night and sleeps intermittently throughout the day. Patient states he "smokes a lot of weed" but is unable to give specifics about his use. Patient denies any other substance use. Patient denies any HI or AVH. Patient does have access to firearms. Patient denies history of abuse or current criminal charges. Patient's current symptoms appear to  be resulting from complications related to his diabetes, grief from his mother's passing, and depression.  Patient was alert and oriented x 4. He was dressed appropriately and maintained good eye contact. Patient was cooperative during assessment. He spoke softly and clearly. His thought process was logical and his memory intact. Patient's mood was depressed and irritable. His affect was congruent. Patient does not appear to be responding to internal stimuli or experiencing delusional thought content.   Diagnosis: F32.2 MDD, single episode, severe  Past Medical History:  Past Medical History:  Diagnosis Date  . Diabetes mellitus without complication (HCC)   . Hypertension   . Peripheral neuropathy     Past Surgical History:  Procedure Laterality Date  . AMPUTATION Left 06/22/2018   Procedure: LEFT FOOT FIRST RAY AMPUTATION;  Surgeon: Nadara Mustard, MD;  Location: [facility];  Service: Orthopedics;  Laterality: Left;  . AMPUTATION Left 09/09/2018   Procedure: Left Transmetatarsal Amputation;  Surgeon: Nadara Mustard, MD;  Location: [facility] OR;  Service: Orthopedics;  Laterality: Left;  . EYE SURGERY      Family History: History reviewed. No pertinent family history.  Social History:  reports that he has been smoking cigarettes. He has been smoking about 0.50 packs per day. He has never used smokeless tobacco. He reports that he does not drink alcohol or use drugs.  Additional Social History:  Alcohol / Drug Use Pain Medications: see MAR Prescriptions: see MAR Over the Counter: see MAR History of alcohol / drug use?: Yes Longest period of sobriety (when/how long): UTA Substance #1  Name of Substance 1: THC 1 - Age of First Use: unknown 1 - Amount (size/oz): unknown 1 - Frequency: daily 1 - Duration: 7 months 1 - Last Use / Amount: 10/03/2018  CIWA: CIWA-Ar BP: (!) 167/86 Pulse Rate: 88 COWS:    Allergies: No Known Allergies  Home Medications:  (Not in a hospital  admission)  OB/GYN Status:  No LMP for male patient.  General Assessment Data Location of Assessment: [facility] In[facility] Assessment Services TTS Assessment: In system Is this a Tele or Face-to-Face Assessment?: Face-to-Face Is this an Initial Assessment or a Re-assessment for this encounter?: Initial Assessment Patient Accompanied by:: Other(sister, Corrie Dandy) Language Other than English: No Living Arrangements: Other (Comment)(independently at home) What gender do you identify as?: Male Marital status: Single Maiden name: Dacanay Pregnancy Status: No Living Arrangements: Alone Can pt return to current living arrangement?: Yes Admission Status: Voluntary Is patient capable of signing voluntary admission?: Yes Referral Source: Self/Family/Friend Insurance type: [employer]     Crisis Care Plan Living Arrangements: Alone  Education Status Is patient currently in school?: No Is the patient employed, unemployed or receiving disability?: Receiving disability income  Risk to self with the past 6 months Suicidal Ideation: No-Not Currently/Within Last 6 Months Has patient been a risk to self within the past 6 months prior to admission? : No Suicidal Intent: No Has patient had any suicidal intent within the past 6 months prior to admission? : No Is patient at risk for suicide?: No Suicidal Plan?: No Has patient had any suicidal plan within the past 6 months prior to admission? : No Access to Means: Yes(access to firearms) Specify Access to Suicidal Means: (patient reports owning a pistol and a rifle) What has been your use of drugs/alcohol within the last 12 months?: "a lot of pot" Previous Attempts/Gestures: No How many times?: 0 Other Self Harm Risks: none noted Triggers for Past Attempts: (N/A) Intentional Self Injurious Behavior: None Family Suicide History: No Recent stressful life event(s): Loss (Comment), Recent negative physical changes(mother passed away in 2023/03/08; partial foot amput.  3 weeks ago) Persecutory voices/beliefs?: No Depression: Yes Depression Symptoms: Insomnia, Isolating, Fatigue, Loss of interest in usual pleasures, Feeling worthless/self pity, Feeling angry/irritable Substance abuse history and/or treatment for substance abuse?: No Suicide prevention information given to non-admitted patients: Not applicable  Risk to Others within the past 6 months Homicidal Ideation: No Does patient have any lifetime risk of violence toward others beyond the six months prior to admission? : No Thoughts of Harm to Others: No Current Homicidal Intent: No Current Homicidal Plan: No Access to Homicidal Means: No Identified Victim: none History of harm to others?: No Assessment of Violence: None Noted Violent Behavior Description: none noted Does patient have access to weapons?: Yes (Comment)(pistol and rifle) Criminal Charges Pending?: No Does patient have a court date: No Is patient on probation?: No  Psychosis Hallucinations: None noted Delusions: None noted  Mental Status Report Appearance/Hygiene: Unremarkable Eye Contact: Good Motor Activity: Other (Comment)(limping due to amputation) Speech: Logical/coherent, Slow Level of Consciousness: Alert, Irritable Mood: Depressed Affect: Depressed Anxiety Level: Moderate Thought Processes: Coherent, Relevant Judgement: Partial Orientation: Person, Place, Time, Situation Obsessive Compulsive Thoughts/Behaviors: Minimal  Cognitive Functioning Concentration: Normal Memory: Recent Intact, Remote Intact Is patient IDD: No Insight: Fair Impulse Control: Fair Appetite: Poor Have you had any weight changes? : Loss Amount of the weight change? (lbs): 30 lbs Sleep: Decreased Total Hours of Sleep: 7(reports intermittent sleep in the day; awake all night) Vegetative Symptoms: Staying in  bed  ADLScreening [facility] Assessment Services) Patient's cognitive ability adequate to safely complete daily activities?:  Yes Patient able to express need for assistance with ADLs?: Yes Independently performs ADLs?: Yes (appropriate for developmental age)  Prior Inpatient Therapy Prior Inpatient Therapy: No  Prior Outpatient Therapy Prior Outpatient Therapy: No Does patient have an ACCT team?: No Does patient have Intensive In-House Services?  : No Does patient have [facility] services? : No Does patient have P4CC services?: No  ADL Screening (condition at time of admission) Patient's cognitive ability adequate to safely complete daily activities?: Yes Is the patient deaf or have difficulty hearing?: No Does the patient have difficulty seeing, even when wearing glasses/contacts?: No Does the patient have difficulty concentrating, remembering, or making decisions?: No Patient able to express need for assistance with ADLs?: Yes Does the patient have difficulty dressing or bathing?: No Independently performs ADLs?: Yes (appropriate for developmental age) Does the patient have difficulty walking or climbing stairs?: Yes Weakness of Legs: Left Weakness of Arms/Hands: None  Home Assistive Devices/Equipment Home Assistive Devices/Equipment: Other (Comment)  Therapy Consults (therapy consults require a physician order) PT Evaluation Needed: No OT Evalulation Needed: No SLP Evaluation Needed: No Abuse/Neglect Assessment (Assessment to be complete while patient is alone) Physical Abuse: Denies Verbal Abuse: Denies Sexual Abuse: Denies Exploitation of patient/patient's resources: Denies Self-Neglect: Denies Values / Beliefs Cultural Requests During Hospitalization: None Spiritual Requests During Hospitalization: None Consults Spiritual Care Consult Needed: No Social Work Consult Needed: No Merchant navy officerAdvance Directives (For Healthcare) Does Patient Have a Medical Advance Directive?: No Would patient like information on creating a medical advance directive?: No - Patient declined          Disposition: Per  Shuvon Rankin, NP patient does not meet in patient criteria. [facility]. Disposition Initial Assessment Completed for this Encounter: Yes Disposition of Patient: Discharge Patient refused recommended treatment: No Mode of transportation if patient is discharged?: Car Patient referred to: Outpatient clinic referral  On Site Evaluation by:   Reviewed with Physician:    Celedonio MiyamotoMeredith  Eryn Krejci 10/04/2018 4:23 PM

## 2018-10-04 NOTE — H&P (Signed)
Behavioral Health Medical Screening Exam  Charles Thompson is an 57 y.o. male patient presents as walk in at [facility] accompanied by his sister with complaints of depression.  Patient states that he does not feel like doing anything; sleeping all day and unable to sleep at night.  States that he has not been taking his medications as prescribed; Patient also states that he was recently started on medication for depression by his PCP but he is not taking it as prescribed.  Patient denies suicidal/self-harm/homicidal ideation, psychosis, and paranoia.  Patient encouraged to follow up with his PCP related to blood press, GI issues, and DM.  Instructed to take depression medication everyday.  Discussed intensive outpatient services and patient states that he is interested.   Total Time spent with patient: 45 minutes  Psychiatric Specialty Exam: Physical Exam  Vitals reviewed. Constitutional: He is oriented to person, place, and time. He appears well-developed and well-nourished. No distress.  Neck: Normal range of motion. Neck supple.  Respiratory: Effort normal.  Musculoskeletal: Normal range of motion.  Neurological: He is alert and oriented to person, place, and time.  Skin: Skin is warm and dry.  Psychiatric: His speech is normal and behavior is normal. Judgment and thought content normal. Cognition and memory are normal. He exhibits a depressed mood.    Review of Systems  Cardiovascular: Negative for chest pain, palpitations and leg swelling.       History of HTN  Gastrointestinal: Positive for diarrhea and nausea. Negative for vomiting.       Reports he has had a decreased appetite for the last week.  States that he is taking liquids but not eating.    Endo/Heme/Allergies:       History of Type 2 DM.  Recent amputation of 1/2 ldft foot related to diabetic ulcers that would not heal.  Reports that he is not taking his medications as he should.  Waking in the middle of the night stating possible  related to blood sugar    Psychiatric/Behavioral: Positive for depression and substance abuse. Negative for hallucinations, memory loss and suicidal ideas. The patient is not nervous/anxious and does not have insomnia.   All other systems reviewed and are negative.   Blood pressure (!) 167/86, pulse 88, temperature 97.7 F (36.5 C), resp. rate 16, SpO2 100 %.There is no height or weight on file to calculate BMI.  General Appearance: Casual and Neat  Eye Contact:  Good  Speech:  Clear and Coherent and Normal Rate  Volume:  Normal  Mood:  Depressed  Affect:  Congruent and Depressed  Thought Process:  Coherent and Goal Directed  Orientation:  Full (Time, Place, and Person)  Thought Content:  WDL and Logical  Suicidal Thoughts:  No  Homicidal Thoughts:  No  Memory:  Immediate;   Good Recent;   Good Remote;   Good  Judgement:  Intact  Insight:  Present  Psychomotor Activity:  Normal  Concentration: Concentration: Good and Attention Span: Good  Recall:  Good  Fund of Knowledge:Good  Language: Good  Akathisia:  No  Handed:  Right  AIMS (if indicated):     Assets:  Communication Skills Desire for Improvement Housing Social Support  Sleep:       Musculoskeletal: Strength & Muscle Tone: within normal limits Gait & Station: Wearing orthopedic boot; amulation without difficulty Patient leans: N/A  Blood pressure (!) 167/86, pulse 88, temperature 97.7 F (36.5 C), resp. rate 16, SpO2 100 %.  Recommendation:  Referral to  IOP.  Follow up with PCP  Disposition: No evidence of imminent risk to self or others at present.   Patient does not meet criteria for psychiatric inpatient admission. Supportive therapy provided about ongoing stressors. Refer to IOP. Discussed crisis plan, support from social network, calling 911, coming to the Emergency Department, and calling Suicide Hotline.  Based on my evaluation the patient does not appear to have an emergency medical  condition.  Elayjah Chaney, NP 10/04/2018, 3:58 PM

## 2018-10-05 DIAGNOSIS — E785 Hyperlipidemia, unspecified: Secondary | ICD-10-CM | POA: Diagnosis not present

## 2018-10-05 DIAGNOSIS — R000 Tachycardia, unspecified: Secondary | ICD-10-CM | POA: Diagnosis not present

## 2018-10-05 DIAGNOSIS — R509 Fever, unspecified: Secondary | ICD-10-CM | POA: Diagnosis not present

## 2018-10-05 DIAGNOSIS — Z79899 Other long term (current) drug therapy: Secondary | ICD-10-CM | POA: Diagnosis not present

## 2018-10-05 DIAGNOSIS — G629 Polyneuropathy, unspecified: Secondary | ICD-10-CM | POA: Diagnosis not present

## 2018-10-05 DIAGNOSIS — F329 Major depressive disorder, single episode, unspecified: Secondary | ICD-10-CM | POA: Diagnosis not present

## 2018-10-05 DIAGNOSIS — I10 Essential (primary) hypertension: Secondary | ICD-10-CM | POA: Diagnosis not present

## 2018-10-05 DIAGNOSIS — Z7982 Long term (current) use of aspirin: Secondary | ICD-10-CM | POA: Diagnosis not present

## 2018-10-05 DIAGNOSIS — E1165 Type 2 diabetes mellitus with hyperglycemia: Secondary | ICD-10-CM | POA: Diagnosis not present

## 2018-10-05 DIAGNOSIS — Z794 Long term (current) use of insulin: Secondary | ICD-10-CM | POA: Diagnosis not present

## 2018-10-05 DIAGNOSIS — R110 Nausea: Secondary | ICD-10-CM | POA: Diagnosis not present

## 2018-10-05 DIAGNOSIS — R9431 Abnormal electrocardiogram [ECG] [EKG]: Secondary | ICD-10-CM | POA: Diagnosis not present

## 2018-10-06 DIAGNOSIS — Z89432 Acquired absence of left foot: Secondary | ICD-10-CM | POA: Diagnosis not present

## 2018-10-06 DIAGNOSIS — R45851 Suicidal ideations: Secondary | ICD-10-CM | POA: Diagnosis not present

## 2018-10-06 DIAGNOSIS — F332 Major depressive disorder, recurrent severe without psychotic features: Secondary | ICD-10-CM | POA: Diagnosis not present

## 2018-10-06 DIAGNOSIS — E785 Hyperlipidemia, unspecified: Secondary | ICD-10-CM | POA: Diagnosis not present

## 2018-10-06 DIAGNOSIS — Z7982 Long term (current) use of aspirin: Secondary | ICD-10-CM | POA: Diagnosis not present

## 2018-10-06 DIAGNOSIS — Z79899 Other long term (current) drug therapy: Secondary | ICD-10-CM | POA: Diagnosis not present

## 2018-10-06 DIAGNOSIS — F1210 Cannabis abuse, uncomplicated: Secondary | ICD-10-CM | POA: Diagnosis not present

## 2018-10-06 DIAGNOSIS — F329 Major depressive disorder, single episode, unspecified: Secondary | ICD-10-CM | POA: Diagnosis not present

## 2018-10-06 DIAGNOSIS — Z794 Long term (current) use of insulin: Secondary | ICD-10-CM | POA: Diagnosis not present

## 2018-10-06 DIAGNOSIS — Z7984 Long term (current) use of oral hypoglycemic drugs: Secondary | ICD-10-CM | POA: Diagnosis not present

## 2018-10-06 DIAGNOSIS — F17210 Nicotine dependence, cigarettes, uncomplicated: Secondary | ICD-10-CM | POA: Diagnosis not present

## 2018-10-06 DIAGNOSIS — G629 Polyneuropathy, unspecified: Secondary | ICD-10-CM | POA: Diagnosis not present

## 2018-10-06 DIAGNOSIS — R000 Tachycardia, unspecified: Secondary | ICD-10-CM | POA: Diagnosis not present

## 2018-10-06 DIAGNOSIS — I10 Essential (primary) hypertension: Secondary | ICD-10-CM | POA: Diagnosis not present

## 2018-10-06 DIAGNOSIS — E119 Type 2 diabetes mellitus without complications: Secondary | ICD-10-CM | POA: Diagnosis not present

## 2018-10-06 DIAGNOSIS — E1165 Type 2 diabetes mellitus with hyperglycemia: Secondary | ICD-10-CM | POA: Diagnosis not present

## 2018-10-12 ENCOUNTER — Ambulatory Visit (INDEPENDENT_AMBULATORY_CARE_PROVIDER_SITE_OTHER): Payer: Medicare HMO | Admitting: Family

## 2018-10-12 ENCOUNTER — Ambulatory Visit (HOSPITAL_COMMUNITY): Payer: Medicare HMO | Admitting: Licensed Clinical Social Worker

## 2018-10-12 ENCOUNTER — Encounter (INDEPENDENT_AMBULATORY_CARE_PROVIDER_SITE_OTHER): Payer: Self-pay | Admitting: Family

## 2018-10-12 DIAGNOSIS — Z89432 Acquired absence of left foot: Secondary | ICD-10-CM

## 2018-10-19 ENCOUNTER — Encounter (INDEPENDENT_AMBULATORY_CARE_PROVIDER_SITE_OTHER): Payer: Self-pay | Admitting: Family

## 2018-10-19 DIAGNOSIS — Z89432 Acquired absence of left foot: Secondary | ICD-10-CM | POA: Insufficient documentation

## 2018-10-19 DIAGNOSIS — E119 Type 2 diabetes mellitus without complications: Secondary | ICD-10-CM | POA: Diagnosis not present

## 2018-10-19 NOTE — Progress Notes (Signed)
Office Visit Note   Patient: Charles Thompson           Date of Birth: 1961/01/16           MRN: [id number] Visit Date: 10/12/2018              Requested by: Gaspar Garbe, MD [address] [place], [place] [address] PCP: Wylene Simmer Adelfa Koh, MD  Chief Complaint  Patient presents with  . Left Foot - Routine Post Op    Left foot TMA       HPI: The patient is a 57 yo male who is seen for post operative follow up following left transmetatarsal amputation on 09/09/18. He has swelling and callus no redness but is trying to wear the vive silver compression sock.  Assessment & Plan: Visit Diagnoses:  No diagnosis found.  Plan: Counseled patient avoid weight bearing over the left foot whenever possible. Also counseled to continue the Vive silver compression sock and elevate the left foot as much as possible over the next week. We did remove remainder of sutures today. Continue dry dressing until healed.  Follow-Up Instructions: Return in about 2 weeks (around 10/26/2018).   Ortho Exam  Patient is alert, oriented, no adenopathy, well-dressed, normal affect, normal respiratory effort. Left foot transmetatarsal amputation site is intact. A portion of the sutures remain. There is no erythema. Remaining wound is 8 mm x 2 mm. Granulation. No drainage. no cellulitis or signs of infection.   Imaging: No results found. No images are attached to the encounter.  Labs: Lab Results  Component Value Date   HGBA1C 6.6 (H) 09/09/2018   HGBA1C 6.6 (H) 06/20/2018   ESRSEDRATE 20 (H) 06/20/2018   CRP 7.3 (H) 06/20/2018   REPTSTATUS 06/25/2018 FINAL 06/20/2018   CULT  06/20/2018    NO GROWTH 5 DAYS Performed at [facility], [address] [place], [place] [address]      Lab Results  Component Value Date   ALBUMIN 4.4 10/03/2018   ALBUMIN 3.1 (L) 06/21/2018   ALBUMIN 4.4 04/17/2015   PREALBUMIN 14.3 (L) 06/20/2018    Body mass index is 33.23 kg/m.  Orders:  No orders  of the defined types were placed in this encounter.  No orders of the defined types were placed in this encounter.    Procedures: No procedures performed  Clinical Data: No additional findings.  ROS:  All other systems negative, except as noted in the HPI. Review of Systems  Skin: Positive for wound.    Objective: Vital Signs: Ht 6' (1.829 m)   Wt 245 lb (111.1 kg)   BMI 33.23 kg/m   Specialty Comments:  No specialty comments available.  PMFS History: Patient Active Problem List   Diagnosis Date Noted  . Osteomyelitis of second toe of right foot (HCC)   . Diabetic foot infection (HCC) 06/20/2018  . Diabetic ulcer of left foot associated with type 2 diabetes mellitus (HCC)    Past Medical History:  Diagnosis Date  . Diabetes mellitus without complication (HCC)   . Hypertension   . Peripheral neuropathy     History reviewed. No pertinent family history.  Past Surgical History:  Procedure Laterality Date  . AMPUTATION Left 06/22/2018   Procedure: LEFT FOOT FIRST RAY AMPUTATION;  Surgeon: Nadara Mustard, MD;  Location: [facility] OR;  Service: Orthopedics;  Laterality: Left;  . AMPUTATION Left 09/09/2018   Procedure: Left Transmetatarsal Amputation;  Surgeon: Nadara Mustard, MD;  Location: [facility] OR;  Service: Orthopedics;  Laterality: Left;  . EYE SURGERY     Social History   Occupational History  . Not on file  Tobacco Use  . Smoking status: Current Every Day Smoker    Packs/day: 0.50    Types: Cigarettes  . Smokeless tobacco: Never Used  Substance and Sexual Activity  . Alcohol use: No    Alcohol/week: 0.0 standard drinks  . Drug use: Never  . Sexual activity: Not on file

## 2018-10-20 DIAGNOSIS — F329 Major depressive disorder, single episode, unspecified: Secondary | ICD-10-CM | POA: Diagnosis not present

## 2018-10-20 DIAGNOSIS — Z0100 Encounter for examination of eyes and vision without abnormal findings: Secondary | ICD-10-CM | POA: Diagnosis not present

## 2018-10-20 DIAGNOSIS — F419 Anxiety disorder, unspecified: Secondary | ICD-10-CM | POA: Diagnosis not present

## 2018-10-26 ENCOUNTER — Ambulatory Visit (INDEPENDENT_AMBULATORY_CARE_PROVIDER_SITE_OTHER): Payer: Medicare HMO | Admitting: Family

## 2018-11-02 DIAGNOSIS — M12271 Villonodular synovitis (pigmented), right ankle and foot: Secondary | ICD-10-CM | POA: Diagnosis not present

## 2018-11-02 DIAGNOSIS — L97421 Non-pressure chronic ulcer of left heel and midfoot limited to breakdown of skin: Secondary | ICD-10-CM | POA: Diagnosis not present

## 2018-11-02 DIAGNOSIS — M12272 Villonodular synovitis (pigmented), left ankle and foot: Secondary | ICD-10-CM | POA: Diagnosis not present

## 2018-11-02 DIAGNOSIS — M79672 Pain in left foot: Secondary | ICD-10-CM | POA: Diagnosis not present

## 2018-11-02 DIAGNOSIS — L97512 Non-pressure chronic ulcer of other part of right foot with fat layer exposed: Secondary | ICD-10-CM | POA: Diagnosis not present

## 2018-11-02 DIAGNOSIS — M79671 Pain in right foot: Secondary | ICD-10-CM | POA: Diagnosis not present

## 2018-11-24 DIAGNOSIS — L97421 Non-pressure chronic ulcer of left heel and midfoot limited to breakdown of skin: Secondary | ICD-10-CM | POA: Diagnosis not present

## 2018-11-24 DIAGNOSIS — L97512 Non-pressure chronic ulcer of other part of right foot with fat layer exposed: Secondary | ICD-10-CM | POA: Diagnosis not present

## 2018-12-05 ENCOUNTER — Ambulatory Visit (HOSPITAL_COMMUNITY): Payer: Medicare HMO | Admitting: Psychiatry

## 2018-12-07 ENCOUNTER — Ambulatory Visit (INDEPENDENT_AMBULATORY_CARE_PROVIDER_SITE_OTHER): Payer: Medicare HMO | Admitting: Psychiatry

## 2018-12-07 ENCOUNTER — Encounter (HOSPITAL_COMMUNITY): Payer: Self-pay | Admitting: Psychiatry

## 2018-12-07 VITALS — BP 140/82 | Ht 72.0 in | Wt 242.0 lb

## 2018-12-07 DIAGNOSIS — F331 Major depressive disorder, recurrent, moderate: Secondary | ICD-10-CM

## 2018-12-07 MED ORDER — SERTRALINE HCL 100 MG PO TABS: 100.0000 mg | ORAL_TABLET | Freq: Every day | ORAL | 0 refills | Status: DC

## 2018-12-07 MED ORDER — ZOLPIDEM TARTRATE 5 MG PO TABS: 5.0000 mg | ORAL_TABLET | Freq: Every evening | ORAL | 0 refills | Status: DC | PRN

## 2018-12-07 NOTE — Patient Instructions (Signed)
Plan:  1. Increase dose of sertraline (Zoloft) to 100 mg daily.  2. Stop taking trazodone for sleep.  3. Trial of zolpidem (Ambien) - take one tablet immediately before going to bed.  4. Please come back to see me in 4 weeks.

## 2018-12-07 NOTE — Progress Notes (Signed)
Psychiatric Initial Adult Assessment   Patient Identification: Charles Thompson MRN:  [id number] Date of Evaluation:  12/07/2018 Referral Source: self Chief Complaint:  depression, initial insomnia Visit Diagnosis:    ICD-10-CM   1. Major depressive disorder, recurrent episode, moderate (HCC) F33.1     History of Present Illness:  58 yo male who comes c/o depressed and anxious mood, difficulty falling asleep, rumination, poor energy, oversleeping, poor appetite. He has had panic attacks as well as periods of depression in the past but they resolved on their own and he never sought professional help. He has taken care of his elderly mother after his father passed away over 30 years ago. She died in April last year. He grieved hier loss but did fairly well until November last year. After his birthday in mid November his mood markedly declines and he started to feel hopeless and passively suicidal. He checked self into [facility] (1st ever psychiatric admission) and was there three days from 10/06/18. While there he was placed on sertraline 50 mg and trazodone for sleep. He eventually stopped taking trazodone because he was sleeping too much during the day whereas it did not help him to fall asleep. He has not has suicidal thought since that hospitalization but still feels down on himself, apathetic, endorses some anhedonia, poor appetite. He has a history of diabetes, hypertension as well as osteomyelitis of the foot. He is on disability (DM type 1 with neuropathy), lives alone, few friends but does not regularly see them in Fall/Winter - they play golf together.  He is not religious person, does not have any hobbies. He admits that Zoloft has helped but he  Is still depressed and mostly worried about his difficulty falling asleep and ruminating about the past and uncertain future. He does not abuse alcohol (episodic cannabis use),; there is no hx of mania or psychosis.  Associated  Signs/Symptoms: Depression Symptoms:  depressed mood, anhedonia, insomnia, fatigue, anxiety, decreased appetite, (Hypo) Manic Symptoms:  none Anxiety Symptoms:  Excessive Worry, Psychotic Symptoms:  None PTSD Symptoms: Negative  Past Psychiatric History: see above  Previous Psychotropic Medications: No   Substance Abuse History in the last 12 months:  No.  Consequences of Substance Abuse: Negative  Past Medical History:  Past Medical History:  Diagnosis Date  . Diabetes mellitus without complication (HCC)   . Hypertension   . Peripheral neuropathy     Past Surgical History:  Procedure Laterality Date  . AMPUTATION Left 06/22/2018   Procedure: LEFT FOOT FIRST RAY AMPUTATION;  Surgeon: Nadara Mustarduda, Marcus V, MD;  [facility];  Service: Orthopedics;  Laterality: Left;  . AMPUTATION Left 09/09/2018   Procedure: Left Transmetatarsal Amputation;  Surgeon: Nadara Mustarduda, Marcus V, MD;  [facility];  Service: Orthopedics;  Laterality: Left;  . EYE SURGERY      Family Psychiatric History: None  Family History: History reviewed. No pertinent family history.  Social History:   Social History   Socioeconomic History  . Marital status: Single    Spouse name: Not on file  . Number of children: Not on file  . Years of education: Not on file  . Highest education level: Not on file  Occupational History  . Not on file  Social Needs  . Financial resource strain: Not on file  . Food insecurity:    Worry: Not on file    Inability: Not on file  . Transportation needs:    Medical: Not on file    Non-medical: Not  on file  Tobacco Use  . Smoking status: Current Every Day Smoker    Packs/day: 0.50    Types: Cigarettes  . Smokeless tobacco: Never Used  Substance and Sexual Activity  . Alcohol use: No    Alcohol/week: 0.0 standard drinks  . Drug use: Never  . Sexual activity: Not Currently  Lifestyle  . Physical activity:    Days per week: Not on file    Minutes per session: Not  on file  . Stress: Not on file  Relationships  . Social connections:    Talks on phone: Not on file    Gets together: Not on file    Attends religious service: Not on file    Active member of club or organization: Not on file    Attends meetings of clubs or organizations: Not on file    Relationship status: Not on file  Other Topics Concern  . Not on file  Social History Narrative  . Not on file    Additional Social History: He worked for [employer], now on disability, financially secure. Single, no children, lives alone.  Allergies:  No Known Allergies  Metabolic Disorder Labs: Lab Results  Component Value Date   HGBA1C 6.6 (H) 09/09/2018   MPG 142.72 09/09/2018   MPG 142.72 06/20/2018   No results found for: PROLACTIN No results found for: CHOL, TRIG, HDL, CHOLHDL, VLDL, LDLCALC No results found for: TSH  Therapeutic Level Labs: No results found for: LITHIUM No results found for: CBMZ No results found for: VALPROATE  Current Medications: Current Outpatient Medications  Medication Sig Dispense Refill  . amLODipine (NORVASC) 10 MG tablet Take 10 mg by mouth daily.    Marland Kitchen aspirin 81 MG tablet Take 81 mg by mouth daily.    Marland Kitchen atorvastatin (LIPITOR) 10 MG tablet Take 10 mg by mouth daily.    Marland Kitchen doxycycline (VIBRAMYCIN) 100 MG capsule Take 1 capsule (100 mg total) by mouth 2 (two) times daily. 28 capsule 1  . gabapentin (NEURONTIN) 300 MG capsule Take 300 mg by mouth 2 (two) times daily.     Marland Kitchen lisinopril-hydrochlorothiazide (PRINZIDE,ZESTORETIC) 20-12.5 MG tablet Take 1 tablet by mouth 2 (two) times daily.     Marland Kitchen omeprazole (PRILOSEC) 20 MG capsule Take 20 mg by mouth daily as needed (heartburn).     . sertraline (ZOLOFT) 100 MG tablet Take 1 tablet (100 mg total) by mouth daily for 30 days. 30 tablet 0  . SYNJARDY 12.03-999 MG TABS Take 1 tablet by mouth 2 (two) times daily.     Nathen May MAX SOLOSTAR 300 UNIT/ML SOPN Inject 70 Units into the skin 2 (two) times daily.    12  . zolpidem (AMBIEN) 5 MG tablet Take 1 tablet (5 mg total) by mouth at bedtime as needed for sleep. 30 tablet 0   No current facility-administered medications for this visit.     Musculoskeletal: Strength & Muscle Tone: within normal limits Gait & Station: normal Patient leans: N/A  Psychiatric Specialty Exam: Review of Systems  Constitutional: Negative.   HENT: Negative.   Eyes: Negative.   Respiratory: Negative.   Cardiovascular: Negative.   Gastrointestinal: Positive for heartburn.  Genitourinary: Negative.   Musculoskeletal: Negative.   Skin: Negative.   Neurological: Negative.   Psychiatric/Behavioral: The patient has insomnia.     Blood pressure 140/82, height 6' (1.829 m), weight 242 lb (109.8 kg).Body mass index is 32.82 kg/m.  General Appearance: Fairly Groomed  Eye Contact:  Fair  Speech:  Clear and Coherent  Volume:  Normal  Mood:  Depressed  Affect:  Congruent and Constricted  Thought Process:  Goal Directed  Orientation:  Full (Time, Place, and Person)  Thought Content:  Illogical  Suicidal Thoughts:  No  Homicidal Thoughts:  No  Memory:  Immediate;   Good Recent;   Fair Remote;   Good  Judgement:  Good  Insight:  Fair  Psychomotor Activity:  Normal  Concentration:  Concentration: Good  Recall:  Good  Fund of Knowledge:Good  Language: Good  Akathisia:  Negative  Handed:  Right  AIMS (if indicated):  not done  Assets:  Communication Skills Desire for Improvement Financial Resources/Insurance Housing  ADL's:  Intact  Cognition: WNL  Sleep:  Poor   Screenings:   Assessment and Plan: 58 yo male with hx of depression and anxiety which worsened to a point of having SI in November last year. He has had some improvement on Zoloft but still feels depressed and continues to struggle with initial insomnia. Trazodone tried but even on 25 mg felt oversedated for prolonged period of time. He lives alone, limited if any social support. He denies  feeling hopeless or suicidal at this time but admits that he wishes his mood improves further.   Plan: Increase Zoloft to 100 mg, dc trazodone, trial of zolpidem 5 mg prn sleep. The plan was discussed with patient. I spend 60 minutes in direct face to face clinical contact with the patient and devoted approximately 50% of this time to explanation of diagnosis, discussion of treatment options and med education. Patient will return to clinic in 4 weeks.   Magdalene Patricia, MD 1/22/20203:49 PM

## 2018-12-08 ENCOUNTER — Other Ambulatory Visit (HOSPITAL_COMMUNITY): Payer: Self-pay

## 2018-12-08 DIAGNOSIS — Z89412 Acquired absence of left great toe: Secondary | ICD-10-CM | POA: Diagnosis not present

## 2018-12-08 DIAGNOSIS — E11319 Type 2 diabetes mellitus with unspecified diabetic retinopathy without macular edema: Secondary | ICD-10-CM | POA: Diagnosis not present

## 2018-12-08 DIAGNOSIS — F17200 Nicotine dependence, unspecified, uncomplicated: Secondary | ICD-10-CM | POA: Diagnosis not present

## 2018-12-08 DIAGNOSIS — F418 Other specified anxiety disorders: Secondary | ICD-10-CM | POA: Diagnosis not present

## 2018-12-08 DIAGNOSIS — Z794 Long term (current) use of insulin: Secondary | ICD-10-CM | POA: Diagnosis not present

## 2018-12-08 DIAGNOSIS — F3289 Other specified depressive episodes: Secondary | ICD-10-CM | POA: Diagnosis not present

## 2018-12-08 DIAGNOSIS — E1151 Type 2 diabetes mellitus with diabetic peripheral angiopathy without gangrene: Secondary | ICD-10-CM | POA: Diagnosis not present

## 2018-12-08 DIAGNOSIS — Z89411 Acquired absence of right great toe: Secondary | ICD-10-CM | POA: Diagnosis not present

## 2018-12-08 DIAGNOSIS — E11311 Type 2 diabetes mellitus with unspecified diabetic retinopathy with macular edema: Secondary | ICD-10-CM | POA: Diagnosis not present

## 2018-12-08 MED ORDER — HYDROXYZINE HCL 25 MG PO TABS: 25.0000 mg | ORAL_TABLET | Freq: Four times a day (QID) | ORAL | 0 refills | Status: DC | PRN

## 2018-12-15 DIAGNOSIS — L97512 Non-pressure chronic ulcer of other part of right foot with fat layer exposed: Secondary | ICD-10-CM | POA: Diagnosis not present

## 2019-01-04 ENCOUNTER — Encounter (HOSPITAL_COMMUNITY): Payer: Self-pay | Admitting: Psychiatry

## 2019-01-04 ENCOUNTER — Ambulatory Visit (INDEPENDENT_AMBULATORY_CARE_PROVIDER_SITE_OTHER): Payer: Medicare HMO | Admitting: Psychiatry

## 2019-01-04 VITALS — BP 151/73 | HR 89 | Ht 72.0 in | Wt 235.2 lb

## 2019-01-04 DIAGNOSIS — F331 Major depressive disorder, recurrent, moderate: Secondary | ICD-10-CM

## 2019-01-04 MED ORDER — SERTRALINE HCL 100 MG PO TABS: 150.0000 mg | ORAL_TABLET | Freq: Every day | ORAL | 0 refills | Status: DC

## 2019-01-04 NOTE — Progress Notes (Signed)
BH MD/PA/NP OP Progress Note  01/04/2019 3:43 PM Charles Thompson  MRN:  [id number]  Chief Complaint: depression, lack of motivation HPI: 58 yo male with major depression which tends to worsen in the Fall/Wintyer months. He has checked self into [facility] in November last year because of passive SI/hgopelessness. Started on sertraline, dose was increased to 100 mg a month ago.eports some improvement in mood but is not in remission yet. Still suffers from low motivation, low energy, tends to sleep during the day and has difficulty with sleep at night (on and off). He did not see much benefit from taking 5 mg of Ambien for sleep. He also takes hydroxyzine prn anxiety not more often than 1-2  X a day. He denies having suicidal thoughts and patiently waits "for days to get longer".  Visit Diagnosis:    ICD-10-CM   1. Major depressive disorder, recurrent episode, moderate with seasonal pattern (HCC) F33.1     Past Psychiatric History: See H&P from last visit no additions required.  Past Medical History:  Past Medical History:  Diagnosis Date  . Diabetes mellitus without complication (HCC)   . Hypertension   . Peripheral neuropathy     Past Surgical History:  Procedure Laterality Date  . AMPUTATION Left 06/22/2018   Procedure: LEFT FOOT FIRST RAY AMPUTATION;  Surgeon: Nadara Mustarduda, Marcus V, MD;  [facility];  Service: Orthopedics;  Laterality: Left;  . AMPUTATION Left 09/09/2018   Procedure: Left Transmetatarsal Amputation;  Surgeon: Nadara Mustarduda, Marcus V, MD;  [facility];  Service: Orthopedics;  Laterality: Left;  . EYE SURGERY      Family Psychiatric History: Nione  Family History: No family history on file.  Social History:  Social History   Socioeconomic History  . Marital status: Single    Spouse name: Not on file  . Number of children: Not on file  . Years of education: Not on file  . Highest education level: Not on file  Occupational History  . Not on file  Social Needs  .  Financial resource strain: Not on file  . Food insecurity:    Worry: Not on file    Inability: Not on file  . Transportation needs:    Medical: Not on file    Non-medical: Not on file  Tobacco Use  . Smoking status: Current Every Day Smoker    Packs/day: 0.50    Types: Cigarettes  . Smokeless tobacco: Never Used  Substance and Sexual Activity  . Alcohol use: No    Alcohol/week: 0.0 standard drinks  . Drug use: Never  . Sexual activity: Not Currently  Lifestyle  . Physical activity:    Days per week: Not on file    Minutes per session: Not on file  . Stress: Not on file  Relationships  . Social connections:    Talks on phone: Not on file    Gets together: Not on file    Attends religious service: Not on file    Active member of club or organization: Not on file    Attends meetings of clubs or organizations: Not on file    Relationship status: Not on file  Other Topics Concern  . Not on file  Social History Narrative  . Not on file    Allergies: No Known Allergies  Metabolic Disorder Labs: Lab Results  Component Value Date   HGBA1C 6.6 (H) 09/09/2018   MPG 142.72 09/09/2018   MPG 142.72 06/20/2018   No results found for: PROLACTIN  No results found for: CHOL, TRIG, HDL, CHOLHDL, VLDL, LDLCALC No results found for: TSH  Therapeutic Level Labs: No results found for: LITHIUM No results found for: VALPROATE No components found for:  CBMZ  Current Medications: Current Outpatient Medications  Medication Sig Dispense Refill  . amLODipine (NORVASC) 10 MG tablet Take 10 mg by mouth daily.    Marland Kitchen aspirin 81 MG tablet Take 81 mg by mouth daily.    Marland Kitchen atorvastatin (LIPITOR) 10 MG tablet Take 10 mg by mouth daily.    Marland Kitchen doxycycline (VIBRAMYCIN) 100 MG capsule Take 1 capsule (100 mg total) by mouth 2 (two) times daily. 28 capsule 1  . gabapentin (NEURONTIN) 300 MG capsule Take 300 mg by mouth 2 (two) times daily.     . hydrOXYzine (ATARAX/VISTARIL) 25 MG tablet Take 1 tablet  (25 mg total) by mouth every 6 (six) hours as needed. 120 tablet 0  . lisinopril-hydrochlorothiazide (PRINZIDE,ZESTORETIC) 20-12.5 MG tablet Take 1 tablet by mouth 2 (two) times daily.     Marland Kitchen omeprazole (PRILOSEC) 20 MG capsule Take 20 mg by mouth daily as needed (heartburn).     . sertraline (ZOLOFT) 100 MG tablet Take 1.5 tablets (150 mg total) by mouth daily. 135 tablet 0  . SYNJARDY 12.03-999 MG TABS Take 1 tablet by mouth 2 (two) times daily.     Nathen May MAX SOLOSTAR 300 UNIT/ML SOPN Inject 70 Units into the skin 2 (two) times daily.   12  . zolpidem (AMBIEN) 5 MG tablet Take 1 tablet (5 mg total) by mouth at bedtime as needed for sleep. 30 tablet 0   No current facility-administered medications for this visit.      Musculoskeletal: Strength & Muscle Tone: within normal limits Gait & Station: normal Patient leans: N/A  Psychiatric Specialty Exam: ROS  Blood pressure (!) 151/73, pulse 89, height 6' (1.829 m), weight 235 lb 3.2 oz (106.7 kg), SpO2 94 %.Body mass index is 31.9 kg/m.  General Appearance: Casual and Fairly Groomed  Eye Contact:  Good  Speech:  Clear and Coherent  Volume:  Normal  Mood:  Depressed  Affect:  Congruent and Constricted  Thought Process:  Goal Directed  Orientation:  Full (Time, Place, and Person)  Thought Content: Logical   Suicidal Thoughts:  No  Homicidal Thoughts:  No  Memory:  Immediate;   Good Recent;   Good Remote;   Good  Judgement:  Fair  Insight:  Fair  Psychomotor Activity:  Normal  Concentration:  Concentration: Fair  Recall:  Good  Fund of Knowledge: Good  Language: Good  Akathisia:  Negative  Handed:  Right  AIMS (if indicated): not done  Assets:  Communication Skills Desire for Improvement Financial Resources/Insurance Housing  ADL's:  Intact  Cognition: WNL  Sleep:  Fair     Assessment and Plan: 58 yo male with hx of depression and anxiety which worsened to a point of having SI in November last year. He appears to  have a seasonal pattern of depression. He has had some improvement on Zoloft but still feels depressed and continues to struggle low energy/motivation. Ambien did not help with sleep. He takes hydroxyzine occasionally for anxiety. He lives alone, limited if any social support. He denies feeling suicidal but admits that he wishes his mood improves further.  Dx: Major depressive disorder recurrent moderate with seasonal pattern  Plan: Increase dose of Zoloft to 150 mg daily; try double dose of Ambien prn sleep (10 mg); continue hydroxyzine prn anxiety unchanged.  We have discussed option of using a light box next year beginning in October-November. Patient will rerturn to clinic in 6 weeks.   Magdalene Patricia, MD 01/04/2019, 3:43 PM

## 2019-01-12 DIAGNOSIS — L97512 Non-pressure chronic ulcer of other part of right foot with fat layer exposed: Secondary | ICD-10-CM | POA: Diagnosis not present

## 2019-02-02 DIAGNOSIS — L97512 Non-pressure chronic ulcer of other part of right foot with fat layer exposed: Secondary | ICD-10-CM | POA: Diagnosis not present

## 2019-02-15 ENCOUNTER — Ambulatory Visit (INDEPENDENT_AMBULATORY_CARE_PROVIDER_SITE_OTHER): Payer: Medicare HMO | Admitting: Psychiatry

## 2019-02-15 ENCOUNTER — Other Ambulatory Visit: Payer: Self-pay

## 2019-02-15 DIAGNOSIS — F330 Major depressive disorder, recurrent, mild: Secondary | ICD-10-CM

## 2019-02-15 DIAGNOSIS — F3340 Major depressive disorder, recurrent, in remission, unspecified: Secondary | ICD-10-CM | POA: Insufficient documentation

## 2019-02-15 MED ORDER — SERTRALINE HCL 100 MG PO TABS: 150.0000 mg | ORAL_TABLET | Freq: Every day | ORAL | 0 refills | Status: DC

## 2019-02-15 MED ORDER — HYDROXYZINE HCL 25 MG PO TABS: 25.0000 mg | ORAL_TABLET | Freq: Three times a day (TID) | ORAL | 0 refills | Status: DC | PRN

## 2019-02-15 NOTE — Progress Notes (Signed)
BH MD/PA/NP OP Progress Note  02/15/2019 2:14 PM Charles Thompson  MRN:  [id number]  Interview was conducted using teleconferencing and I verified that I was speaking with the correct person using two identifiers. I discussed the limitations of evaluation and management by telemedicine and  the availability of in person appointments. Patient expressed understanding and agreed to proceed.  Chief Complaint:  anxiety, depression  HPI: 58 yo male with hx of depression and anxiety which worsened to a point of having SI in November last year. He appears to have a seasonal pattern of depression. He has had further improvement in mood after Zoloft was increased to 150 mg. Ambien did not help with middle insomnia - he does not take it but denies having major issues with sleep anymore. He takes 25 mg of hydroxyzine occasionally for anxiety (not more than once a day). He lives alone, limited if any social support. He denies feeling suicidal and feels that his mood has sufficiently improved so no further med adjustments are necessary at this time.   Visit Diagnosis:    ICD-10-CM   1. Major depressive disorder, recurrent episode, mild with seasonal pattern (HCC) F33.0     Past Psychiatric History: Please refer to intake H&P  Past Medical History:  Past Medical History:  Diagnosis Date  . Diabetes mellitus without complication (HCC)   . Hypertension   . Peripheral neuropathy     Past Surgical History:  Procedure Laterality Date  . AMPUTATION Left 06/22/2018   Procedure: LEFT FOOT FIRST RAY AMPUTATION;  Surgeon: Nadara Mustard, MD;  Location[facility] OR;  Service: Orthopedics;  Laterality: Left;  . AMPUTATION Left 09/09/2018   Procedure: Left Transmetatarsal Amputation;  Surgeon: Nadara Mustard, MD;  Location: [facility] OR;  Service: Orthopedics;  Laterality: Left;  . EYE SURGERY      Family Psychiatric History: None  Family History: No family history on file.  Social History:  Social History    Socioeconomic History  . Marital status: Single    Spouse name: Not on file  . Number of children: Not on file  . Years of education: Not on file  . Highest education level: Not on file  Occupational History  . Not on file  Social Needs  . Financial resource strain: Not on file  . Food insecurity:    Worry: Not on file    Inability: Not on file  . Transportation needs:    Medical: Not on file    Non-medical: Not on file  Tobacco Use  . Smoking status: Current Every Day Smoker    Packs/day: 0.50    Types: Cigarettes  . Smokeless tobacco: Never Used  Substance and Sexual Activity  . Alcohol use: No    Alcohol/week: 0.0 standard drinks  . Drug use: Never  . Sexual activity: Not Currently  Lifestyle  . Physical activity:    Days per week: Not on file    Minutes per session: Not on file  . Stress: Not on file  Relationships  . Social connections:    Talks on phone: Not on file    Gets together: Not on file    Attends religious service: Not on file    Active member of club or organization: Not on file    Attends meetings of clubs or organizations: Not on file    Relationship status: Not on file  Other Topics Concern  . Not on file  Social History Narrative  . Not on file  Allergies: No Known Allergies  Metabolic Disorder Labs: Lab Results  Component Value Date   HGBA1C 6.6 (H) 09/09/2018   MPG 142.72 09/09/2018   MPG 142.72 06/20/2018   No results found for: PROLACTIN No results found for: CHOL, TRIG, HDL, CHOLHDL, VLDL, LDLCALC No results found for: TSH  Therapeutic Level Labs: No results found for: LITHIUM No results found for: VALPROATE No components found for:  CBMZ  Current Medications: Current Outpatient Medications  Medication Sig Dispense Refill  . amLODipine (NORVASC) 10 MG tablet Take 10 mg by mouth daily.    Marland Kitchen aspirin 81 MG tablet Take 81 mg by mouth daily.    Marland Kitchen atorvastatin (LIPITOR) 10 MG tablet Take 10 mg by mouth daily.    Marland Kitchen  doxycycline (VIBRAMYCIN) 100 MG capsule Take 1 capsule (100 mg total) by mouth 2 (two) times daily. 28 capsule 1  . gabapentin (NEURONTIN) 300 MG capsule Take 300 mg by mouth 2 (two) times daily.     . hydrOXYzine (ATARAX/VISTARIL) 25 MG tablet Take 1 tablet (25 mg total) by mouth every 6 (six) hours as needed. 120 tablet 0  . lisinopril-hydrochlorothiazide (PRINZIDE,ZESTORETIC) 20-12.5 MG tablet Take 1 tablet by mouth 2 (two) times daily.     Marland Kitchen omeprazole (PRILOSEC) 20 MG capsule Take 20 mg by mouth daily as needed (heartburn).     . sertraline (ZOLOFT) 100 MG tablet Take 1.5 tablets (150 mg total) by mouth daily. 135 tablet 0  . SYNJARDY 12.03-999 MG TABS Take 1 tablet by mouth 2 (two) times daily.     Nathen May MAX SOLOSTAR 300 UNIT/ML SOPN Inject 70 Units into the skin 2 (two) times daily.   12   No current facility-administered medications for this visit.      Psychiatric Specialty Exam: Review of Systems  Psychiatric/Behavioral: Positive for depression. The patient has insomnia.   All other systems reviewed and are negative.   There were no vitals taken for this visit.There is no height or weight on file to calculate BMI.  General Appearance: NA  Eye Contact:  NA  Speech:  Clear and Coherent  Volume:  Normal  Mood:  "Better".  Affect:  NA  Thought Process:  Coherent and Goal Directed  Orientation:  Full (Time, Place, and Person)  Thought Content: Logical   Suicidal Thoughts:  No  Homicidal Thoughts:  No  Memory:  Immediate;   Good Recent;   Good Remote;   Good  Judgement:  Intact  Insight:  Good  Psychomotor Activity:  NA  Concentration:  Concentration: Good  Recall:  Good  Fund of Knowledge: Good  Language: Good  Akathisia:  NA  Handed:  Right  AIMS (if indicated): not done  Assets:  Communication Skills Desire for Improvement Housing Resilience  ADL's:  Intact  Cognition: WNL  Sleep:  Fair    Assessment and Plan:  58 yo male with hx of depression and anxiety  which worsened to a point of having SI in November last year. He appears to have a seasonal pattern of depression. He has had further improvement in mood after Zoloft was increased to 150 mg. Ambien did not help with middle insomnia - he does not take it but denies having major issues with sleep anymore. He takes 25 mg of hydroxyzine occasionally for anxiety (not more than once a day). He lives alone, limited if any social support. He denies feeling suicidal and feels that his mood has sufficiently improved so no further med adjustments are necessary  at this time.  Plan: Continue sertraline 150 mg daily and hydroxyzine 25 mg prn anxiety, 3 month prescriptions called to pharmacy. I will not continue zolpidem. Next medication management appointment in 3 months.    Magdalene Patricia, MD 02/15/2019, 2:14 PM

## 2019-03-08 DIAGNOSIS — R82998 Other abnormal findings in urine: Secondary | ICD-10-CM | POA: Diagnosis not present

## 2019-03-08 DIAGNOSIS — Z125 Encounter for screening for malignant neoplasm of prostate: Secondary | ICD-10-CM | POA: Diagnosis not present

## 2019-03-08 DIAGNOSIS — E1151 Type 2 diabetes mellitus with diabetic peripheral angiopathy without gangrene: Secondary | ICD-10-CM | POA: Diagnosis not present

## 2019-03-08 DIAGNOSIS — I1310 Hypertensive heart and chronic kidney disease without heart failure, with stage 1 through stage 4 chronic kidney disease, or unspecified chronic kidney disease: Secondary | ICD-10-CM | POA: Diagnosis not present

## 2019-03-08 DIAGNOSIS — E7800 Pure hypercholesterolemia, unspecified: Secondary | ICD-10-CM | POA: Diagnosis not present

## 2019-03-15 DIAGNOSIS — E1151 Type 2 diabetes mellitus with diabetic peripheral angiopathy without gangrene: Secondary | ICD-10-CM | POA: Diagnosis not present

## 2019-03-15 DIAGNOSIS — R809 Proteinuria, unspecified: Secondary | ICD-10-CM | POA: Diagnosis not present

## 2019-03-15 DIAGNOSIS — I1310 Hypertensive heart and chronic kidney disease without heart failure, with stage 1 through stage 4 chronic kidney disease, or unspecified chronic kidney disease: Secondary | ICD-10-CM | POA: Diagnosis not present

## 2019-03-15 DIAGNOSIS — Z89412 Acquired absence of left great toe: Secondary | ICD-10-CM | POA: Diagnosis not present

## 2019-03-15 DIAGNOSIS — Z89411 Acquired absence of right great toe: Secondary | ICD-10-CM | POA: Diagnosis not present

## 2019-03-15 DIAGNOSIS — Z0000 Encounter for general adult medical examination without abnormal findings: Secondary | ICD-10-CM | POA: Diagnosis not present

## 2019-03-15 DIAGNOSIS — I6522 Occlusion and stenosis of left carotid artery: Secondary | ICD-10-CM | POA: Diagnosis not present

## 2019-03-15 DIAGNOSIS — E1149 Type 2 diabetes mellitus with other diabetic neurological complication: Secondary | ICD-10-CM | POA: Diagnosis not present

## 2019-03-15 DIAGNOSIS — N182 Chronic kidney disease, stage 2 (mild): Secondary | ICD-10-CM | POA: Diagnosis not present

## 2019-03-20 DIAGNOSIS — L97512 Non-pressure chronic ulcer of other part of right foot with fat layer exposed: Secondary | ICD-10-CM | POA: Diagnosis not present

## 2019-04-12 DIAGNOSIS — M21621 Bunionette of right foot: Secondary | ICD-10-CM | POA: Diagnosis not present

## 2019-04-12 DIAGNOSIS — L97512 Non-pressure chronic ulcer of other part of right foot with fat layer exposed: Secondary | ICD-10-CM | POA: Diagnosis not present

## 2019-04-26 DIAGNOSIS — L97512 Non-pressure chronic ulcer of other part of right foot with fat layer exposed: Secondary | ICD-10-CM | POA: Diagnosis not present

## 2019-05-11 DIAGNOSIS — L97512 Non-pressure chronic ulcer of other part of right foot with fat layer exposed: Secondary | ICD-10-CM | POA: Diagnosis not present

## 2019-05-17 ENCOUNTER — Ambulatory Visit (HOSPITAL_COMMUNITY): Payer: Medicare HMO | Admitting: Psychiatry

## 2019-05-17 ENCOUNTER — Other Ambulatory Visit (HOSPITAL_COMMUNITY): Payer: Self-pay | Admitting: Psychiatry

## 2019-05-17 ENCOUNTER — Other Ambulatory Visit: Payer: Self-pay

## 2019-05-17 MED ORDER — HYDROXYZINE HCL 25 MG PO TABS: 25.0000 mg | ORAL_TABLET | Freq: Three times a day (TID) | ORAL | 0 refills | Status: DC | PRN

## 2019-05-17 MED ORDER — SERTRALINE HCL 100 MG PO TABS: 150.0000 mg | ORAL_TABLET | Freq: Every day | ORAL | 0 refills | Status: DC

## 2019-05-22 ENCOUNTER — Other Ambulatory Visit: Payer: Self-pay

## 2019-05-22 ENCOUNTER — Ambulatory Visit (INDEPENDENT_AMBULATORY_CARE_PROVIDER_SITE_OTHER): Payer: Medicare HMO | Admitting: Psychiatry

## 2019-05-22 DIAGNOSIS — F3340 Major depressive disorder, recurrent, in remission, unspecified: Secondary | ICD-10-CM

## 2019-05-22 MED ORDER — SERTRALINE HCL 100 MG PO TABS: 150.0000 mg | ORAL_TABLET | Freq: Every day | ORAL | 0 refills | Status: DC

## 2019-05-22 MED ORDER — HYDROXYZINE HCL 25 MG PO TABS: 25.0000 mg | ORAL_TABLET | Freq: Three times a day (TID) | ORAL | 0 refills | Status: AC | PRN

## 2019-05-22 MED ORDER — ZOLPIDEM TARTRATE 5 MG PO TABS: 5.0000 mg | ORAL_TABLET | Freq: Every evening | ORAL | 2 refills | Status: DC | PRN

## 2019-05-22 NOTE — Progress Notes (Signed)
[facility] MD/PA/NP OP Progress Note  05/22/2019 1:39 PM Charles Kauk  MRN:  [id number] Interview was conducted by phone and I verified that I was speaking with the correct person using two identifiers. I discussed the limitations of evaluation and management by telemedicine and  the availability of in person appointments. Patient expressed understanding and agreed to proceed.  Chief Complaint: "I have none".  HPI: 58 yo male with hx of depression and anxiety which worsened to a point of having SI in November last year.He appears to have a seasonal pattern of depression.He has had further improvement in mood after Zoloft was increased to 150 mg. Ambien did help with insomnia - he does not take it but occasionally. He takes 25 mg of hydroxyzine up to twice daily for anxiety.He lives alone, limited if any social support. He denies feeling suicidal and feels that his mood has sufficiently improved so no further med adjustments are necessary at this time.  Visit Diagnosis:    ICD-10-CM   1. Major depressive disorder, recurrent episode, in remission with seasonal pattern ([place])  F33.40     Past Psychiatric History: Please refer to intake H&P.  Past Medical History:  Past Medical History:  Diagnosis Date  . Diabetes mellitus without complication ([place])   . Hypertension   . Peripheral neuropathy     Past Surgical History:  Procedure Laterality Date  . AMPUTATION Left 06/22/2018   Procedure: LEFT FOOT FIRST RAY AMPUTATION;  Surgeon: Newt Minion, MD;  Location: [facility];  Service: Orthopedics;  Laterality: Left;  . AMPUTATION Left 09/09/2018   Procedure: Left Transmetatarsal Amputation;  Surgeon: Newt Minion, MD;  Location: [facility];  Service: Orthopedics;  Laterality: Left;  . EYE SURGERY      Family Psychiatric History: None.  Family History: No family history on file.  Social History:  Social History   Socioeconomic History  . Marital status: Single    Spouse name: Not on file  . Number  of children: Not on file  . Years of education: Not on file  . Highest education level: Not on file  Occupational History  . Not on file  Social Needs  . Financial resource strain: Not on file  . Food insecurity    Worry: Not on file    Inability: Not on file  . Transportation needs    Medical: Not on file    Non-medical: Not on file  Tobacco Use  . Smoking status: Current Every Day Smoker    Packs/day: 0.50    Types: Cigarettes  . Smokeless tobacco: Never Used  Substance and Sexual Activity  . Alcohol use: No    Alcohol/week: 0.0 standard drinks  . Drug use: Never  . Sexual activity: Not Currently  Lifestyle  . Physical activity    Days per week: Not on file    Minutes per session: Not on file  . Stress: Not on file  Relationships  . Social Herbalist on phone: Not on file    Gets together: Not on file    Attends religious service: Not on file    Active member of club or organization: Not on file    Attends meetings of clubs or organizations: Not on file    Relationship status: Not on file  Other Topics Concern  . Not on file  Social History Narrative  . Not on file    Allergies: No Known Allergies  Metabolic Disorder Labs: Lab Results  Component Value Date  HGBA1C 6.6 (H) 09/09/2018   MPG 142.72 09/09/2018   MPG 142.72 06/20/2018   No results found for: PROLACTIN No results found for: CHOL, TRIG, HDL, CHOLHDL, VLDL, LDLCALC No results found for: TSH  Therapeutic Level Labs: No results found for: LITHIUM No results found for: VALPROATE No components found for:  CBMZ  Current Medications: Current Outpatient Medications  Medication Sig Dispense Refill  . amLODipine (NORVASC) 10 MG tablet Take 10 mg by mouth daily.    Marland Kitchen. aspirin 81 MG tablet Take 81 mg by mouth daily.    Marland Kitchen. atorvastatin (LIPITOR) 10 MG tablet Take 10 mg by mouth daily.    Marland Kitchen. doxycycline (VIBRAMYCIN) 100 MG capsule Take 1 capsule (100 mg total) by mouth 2 (two) times daily. 28  capsule 1  . gabapentin (NEURONTIN) 300 MG capsule Take 300 mg by mouth 2 (two) times daily.     . hydrOXYzine (ATARAX/VISTARIL) 25 MG tablet Take 1 tablet (25 mg total) by mouth every 8 (eight) hours as needed for anxiety. 180 tablet 0  . lisinopril-hydrochlorothiazide (PRINZIDE,ZESTORETIC) 20-12.5 MG tablet Take 1 tablet by mouth 2 (two) times daily.     Marland Kitchen. omeprazole (PRILOSEC) 20 MG capsule Take 20 mg by mouth daily as needed (heartburn).     . sertraline (ZOLOFT) 100 MG tablet Take 1.5 tablets (150 mg total) by mouth daily. 135 tablet 0  . SYNJARDY 12.03-999 MG TABS Take 1 tablet by mouth 2 (two) times daily.     Nathen May. TOUJEO MAX SOLOSTAR 300 UNIT/ML SOPN Inject 70 Units into the skin 2 (two) times daily.   12  . zolpidem (AMBIEN) 5 MG tablet Take 1 tablet (5 mg total) by mouth at bedtime as needed for sleep. 30 tablet 2   No current facility-administered medications for this visit.      Psychiatric Specialty Exam: Review of Systems  Psychiatric/Behavioral: Positive for suicidal ideas. The patient is nervous/anxious.   All other systems reviewed and are negative.   There were no vitals taken for this visit.There is no height or weight on file to calculate BMI.  General Appearance: NA  Eye Contact:  NA  Speech:  Clear and Coherent and Normal Rate  Volume:  Normal  Mood:  Some anxiety.  Affect:  NA  Thought Process:  Goal Directed  Orientation:  Full (Time, Place, and Person)  Thought Content: Logical   Suicidal Thoughts:  No  Homicidal Thoughts:  No  Memory:  Immediate;   Good Recent;   Good Remote;   Good  Judgement:  Good  Insight:  Good  Psychomotor Activity:  NA  Concentration:  Concentration: Good  Recall:  Good  Fund of Knowledge: Good  Language: Good  Akathisia:  Negative  Handed:  Right  AIMS (if indicated): not done  Assets:  Communication Skills Desire for Improvement Financial Resources/Insurance Housing Resilience  ADL's:  Intact  Cognition: WNL  Sleep:   Good    Assessment and Plan: 58 yo male with hx of depression and anxiety which worsened to a point of having SI in November last year.He appears to have a seasonal pattern of depression.He has had further improvement in mood after Zoloft was increased to 150 mg. Ambien did help with insomnia - he does not take it but occasionally. He takes 25 mg of hydroxyzine up to twice daily for anxiety.He lives alone, limited if any social support. He denies feeling suicidal and feels that his mood has sufficiently improved so no further med adjustments are necessary  at this time.             Plan: Continue sertraline 150 mg daily and hydroxyzine 25 mg prn anxiety, and   Zolpidem 5 mg as needed for insomnia. Next medication management appointment in 3 months. We may need to add a light box at that time to prevent depressive sx recurrence. The plan was discussed with patient who had an opportunity to ask questions and these were all answered. I spend 25 minutes in phone consultation with the patient.     Magdalene Patricialgierd A Nila Winker, MD 05/22/2019, 1:39 PM

## 2019-06-07 DIAGNOSIS — L97511 Non-pressure chronic ulcer of other part of right foot limited to breakdown of skin: Secondary | ICD-10-CM | POA: Diagnosis not present

## 2019-06-07 DIAGNOSIS — L97512 Non-pressure chronic ulcer of other part of right foot with fat layer exposed: Secondary | ICD-10-CM | POA: Diagnosis not present

## 2019-06-15 DIAGNOSIS — R809 Proteinuria, unspecified: Secondary | ICD-10-CM | POA: Diagnosis not present

## 2019-06-15 DIAGNOSIS — E1140 Type 2 diabetes mellitus with diabetic neuropathy, unspecified: Secondary | ICD-10-CM | POA: Diagnosis not present

## 2019-06-15 DIAGNOSIS — E1122 Type 2 diabetes mellitus with diabetic chronic kidney disease: Secondary | ICD-10-CM | POA: Diagnosis not present

## 2019-06-15 DIAGNOSIS — N182 Chronic kidney disease, stage 2 (mild): Secondary | ICD-10-CM | POA: Diagnosis not present

## 2019-06-15 DIAGNOSIS — Z89412 Acquired absence of left great toe: Secondary | ICD-10-CM | POA: Diagnosis not present

## 2019-06-15 DIAGNOSIS — Z89411 Acquired absence of right great toe: Secondary | ICD-10-CM | POA: Diagnosis not present

## 2019-06-15 DIAGNOSIS — E11319 Type 2 diabetes mellitus with unspecified diabetic retinopathy without macular edema: Secondary | ICD-10-CM | POA: Diagnosis not present

## 2019-06-15 DIAGNOSIS — Z794 Long term (current) use of insulin: Secondary | ICD-10-CM | POA: Diagnosis not present

## 2019-06-15 DIAGNOSIS — F17200 Nicotine dependence, unspecified, uncomplicated: Secondary | ICD-10-CM | POA: Diagnosis not present

## 2019-06-15 DIAGNOSIS — E1151 Type 2 diabetes mellitus with diabetic peripheral angiopathy without gangrene: Secondary | ICD-10-CM | POA: Diagnosis not present

## 2019-06-15 DIAGNOSIS — G629 Polyneuropathy, unspecified: Secondary | ICD-10-CM | POA: Diagnosis not present

## 2019-06-16 DIAGNOSIS — E1151 Type 2 diabetes mellitus with diabetic peripheral angiopathy without gangrene: Secondary | ICD-10-CM | POA: Diagnosis not present

## 2019-06-20 ENCOUNTER — Encounter: Payer: Self-pay | Admitting: Family

## 2019-06-20 ENCOUNTER — Ambulatory Visit (INDEPENDENT_AMBULATORY_CARE_PROVIDER_SITE_OTHER): Payer: Medicare HMO

## 2019-06-20 ENCOUNTER — Ambulatory Visit (INDEPENDENT_AMBULATORY_CARE_PROVIDER_SITE_OTHER): Payer: Medicare HMO | Admitting: Family

## 2019-06-20 ENCOUNTER — Other Ambulatory Visit: Payer: Self-pay

## 2019-06-20 VITALS — Ht 72.0 in | Wt 235.0 lb

## 2019-06-20 DIAGNOSIS — M5441 Lumbago with sciatica, right side: Secondary | ICD-10-CM

## 2019-06-20 DIAGNOSIS — M5416 Radiculopathy, lumbar region: Secondary | ICD-10-CM

## 2019-06-20 DIAGNOSIS — G8929 Other chronic pain: Secondary | ICD-10-CM

## 2019-06-20 MED ORDER — PREDNISONE 50 MG PO TABS: ORAL_TABLET | ORAL | 0 refills | Status: DC

## 2019-06-20 NOTE — Progress Notes (Signed)
Office Visit Note   Patient: Charles Thompson           Date of Birth: June 21, 1961           MRN: [id number] Visit Date: 06/20/2019              Requested by: Gaspar Garbeisovec, Richard W, MD [address] [place],  [place] [id number] PCP: Wylene Simmerisovec, Adelfa Kohichard W, MD  Chief Complaint  Patient presents with  . Lower Back - Pain      HPI: The patient is a 58 year old gentleman who presents today complaining of 2-week history of right buttock pain that radiates around his lateral hip down his thigh down to the calf at times.  He describes this as a stabbing pain.  She denies any weakness no numbness no tingling.  Some relief with sitting standing worsens his pain walking worsens his pain.  Has taken Aleve without relief.  No recent injury no history of back issues no loss of control of bowel or bladder  Assessment & Plan: Visit Diagnoses:  1. Chronic right-sided low back pain with right-sided sciatica   2. Right lumbar radiculopathy     Plan: We will trial a prednisone course.  If no relief may consider ESI versus MRI of the lumbar spine he will call or return should his symptoms not resolve  Follow-Up Instructions: Return in about 4 weeks (around 07/18/2019), or if symptoms worsen or fail to improve.   Back Exam   Tenderness  The patient is experiencing no tenderness.   Range of Motion  The patient has normal back ROM.  Muscle Strength  The patient has normal back strength.  Tests  Straight leg raise right: negative Straight leg raise left: negative  Other  Erythema: no back redness      Patient is alert, oriented, no adenopathy, well-dressed, normal affect, normal respiratory effort.   Imaging: Xr Lumbar Spine 2-3 Views  Result Date: 06/20/2019 Radiographs of lumbar spine with spondylosis of L1 over L2. Osteophytic spurring throughout. No spondylolisthesis.  No images are attached to the encounter.  Labs: Lab Results  Component Value Date   HGBA1C 6.6 (H) 09/09/2018   HGBA1C 6.6 (H) 06/20/2018   ESRSEDRATE 20 (H) 06/20/2018   CRP 7.3 (H) 06/20/2018   REPTSTATUS 06/25/2018 FINAL 06/20/2018   CULT  06/20/2018    NO GROWTH 5 DAYS Performed at [facility], [address] [place], [place] [address]      Lab Results  Component Value Date   ALBUMIN 4.4 10/03/2018   ALBUMIN 3.1 (L) 06/21/2018   ALBUMIN 4.4 04/17/2015   PREALBUMIN 14.3 (L) 06/20/2018    Lab Results  Component Value Date   MG 2.1 06/21/2018   No results found for: [facility]  Lab Results  Component Value Date   PREALBUMIN 14.3 (L) 06/20/2018   CBC EXTENDED Latest Ref Rng & Units 10/03/2018 09/09/2018 06/23/2018  WBC 4.0 - 10.5 K/uL 7.8 11.9(H) 7.5  RBC 4.22 - 5.81 MIL/uL 5.91(H) 5.42 4.65  HGB 13.0 - 17.0 g/dL 21.316.9 08.615.7 57.813.5  HCT 46.939.0 - 52.0 % 49.8 47.2 40.1  PLT 150 - 400 K/uL 238 335 272  NEUTROABS 1.7 - 7.7 K/uL 5.7 - -  LYMPHSABS 0.7 - 4.0 K/uL 1.6 - -     Body mass index is 31.87 kg/m.  Orders:  Orders Placed This Encounter  Procedures  . XR Lumbar Spine 2-3 Views   Meds ordered this encounter  Medications  . predniSONE (DELTASONE) 50 MG  tablet    Sig: Take one tablet by mouth once daily for 5 days    Dispense:  5 tablet    Refill:  0     Procedures: No procedures performed  Clinical Data: No additional findings.  ROS:  All other systems negative, except as noted in the HPI. Review of Systems  Constitutional: Negative for chills and fever.  Musculoskeletal: Negative for back pain.  Neurological: Negative for weakness and numbness.    Objective: Vital Signs: Ht 6' (1.829 m)   Wt 235 lb (106.6 kg)   BMI 31.87 kg/m   Specialty Comments:  No specialty comments available.  PMFS History: Patient Active Problem List   Diagnosis Date Noted  . Right lumbar radiculopathy 06/20/2019  . Major depressive disorder, recurrent episode, in remission with seasonal pattern ([place]) 02/15/2019  . History of transmetatarsal amputation of left foot ([place])  10/19/2018  . Osteomyelitis of second toe of right foot ([place])   . Diabetic foot infection ([place]) 06/20/2018  . Diabetic ulcer of left foot associated with type 2 diabetes mellitus ([place])    Past Medical History:  Diagnosis Date  . Diabetes mellitus without complication ([place])   . Hypertension   . Peripheral neuropathy     History reviewed. No pertinent family history.  Past Surgical History:  Procedure Laterality Date  . AMPUTATION Left 06/22/2018   Procedure: LEFT FOOT FIRST RAY AMPUTATION;  Surgeon: Newt Minion, MD;  Location: [facility];  Service: Orthopedics;  Laterality: Left;  . AMPUTATION Left 09/09/2018   Procedure: Left Transmetatarsal Amputation;  Surgeon: Newt Minion, MD;  Location: [facility];  Service: Orthopedics;  Laterality: Left;  . EYE SURGERY     Social History   Occupational History  . Not on file  Tobacco Use  . Smoking status: Current Every Day Smoker    Packs/day: 0.50    Types: Cigarettes  . Smokeless tobacco: Never Used  Substance and Sexual Activity  . Alcohol use: No    Alcohol/week: 0.0 standard drinks  . Drug use: Never  . Sexual activity: Not Currently

## 2019-06-29 DIAGNOSIS — L97512 Non-pressure chronic ulcer of other part of right foot with fat layer exposed: Secondary | ICD-10-CM | POA: Diagnosis not present

## 2019-06-30 DIAGNOSIS — Z1212 Encounter for screening for malignant neoplasm of rectum: Secondary | ICD-10-CM | POA: Diagnosis not present

## 2019-07-20 DIAGNOSIS — L97512 Non-pressure chronic ulcer of other part of right foot with fat layer exposed: Secondary | ICD-10-CM | POA: Diagnosis not present

## 2019-08-21 DIAGNOSIS — L97512 Non-pressure chronic ulcer of other part of right foot with fat layer exposed: Secondary | ICD-10-CM | POA: Diagnosis not present

## 2019-08-22 ENCOUNTER — Ambulatory Visit (INDEPENDENT_AMBULATORY_CARE_PROVIDER_SITE_OTHER): Payer: Medicare HMO | Admitting: Psychiatry

## 2019-08-22 ENCOUNTER — Other Ambulatory Visit: Payer: Self-pay

## 2019-08-22 DIAGNOSIS — F3340 Major depressive disorder, recurrent, in remission, unspecified: Secondary | ICD-10-CM | POA: Diagnosis not present

## 2019-08-22 MED ORDER — SERTRALINE HCL 100 MG PO TABS: 150.0000 mg | ORAL_TABLET | Freq: Every day | ORAL | 0 refills | Status: DC

## 2019-08-22 MED ORDER — PRAMIPEXOLE DIHYDROCHLORIDE 0.125 MG PO TABS: ORAL_TABLET | ORAL | 0 refills | Status: DC

## 2019-08-22 MED ORDER — ZOLPIDEM TARTRATE 10 MG PO TABS: 10.0000 mg | ORAL_TABLET | Freq: Every evening | ORAL | 2 refills | Status: DC | PRN

## 2019-08-22 NOTE — Progress Notes (Signed)
[facility] MD/PA/NP OP Progress Note  08/22/2019 10:42 AM Charles Thompson  MRN:  [id number] Interview was conducted by phone and I verified that I was speaking with the correct person using two identifiers. I discussed the limitations of evaluation and management by telemedicine and  the availability of in person appointments. Patient expressed understanding and agreed to proceed.  Chief Complaint: Insomnia, muscle twitching at night.  HPI: 58 yo male with hx of depression and anxiety which worsened to a point of having SI in November last year.He appears to have a seasonal pattern of depression.He has hadfurtherimprovementin mood afterZoloftwas increased to 150 mg. Ambien did help withinsomnia - he does not take it but occasionally and finds it marginally helpful. He reports having muscle twitches at night which interfere with his sleep. He has been having those for a long time but did not mention them to me before. He no longer takes 25 mg ofhydroxyzine for anxiety.He lives alone, limited if any social support. He denies feeling suicidaland feels that his mood has sufficiently improved so no further med adjustments are necessary at this time.   Visit Diagnosis:    ICD-10-CM   1. Major depressive disorder, recurrent episode, in remission with seasonal pattern ([place])  F33.40     Past Psychiatric History: Please see intake H&P.  Past Medical History:  Past Medical History:  Diagnosis Date  . Diabetes mellitus without complication ([place])   . Hypertension   . Peripheral neuropathy     Past Surgical History:  Procedure Laterality Date  . AMPUTATION Left 06/22/2018   Procedure: LEFT FOOT FIRST RAY AMPUTATION;  Surgeon: Newt Minion, MD;  Location: [facility];  Service: Orthopedics;  Laterality: Left;  . AMPUTATION Left 09/09/2018   Procedure: Left Transmetatarsal Amputation;  Surgeon: Newt Minion, MD;  Location: [facility];  Service: Orthopedics;  Laterality: Left;  . EYE SURGERY       Family Psychiatric History: None.  Family History: No family history on file.  Social History:  Social History   Socioeconomic History  . Marital status: Single    Spouse name: Not on file  . Number of children: Not on file  . Years of education: Not on file  . Highest education level: Not on file  Occupational History  . Not on file  Social Needs  . Financial resource strain: Not on file  . Food insecurity    Worry: Not on file    Inability: Not on file  . Transportation needs    Medical: Not on file    Non-medical: Not on file  Tobacco Use  . Smoking status: Current Every Day Smoker    Packs/day: 0.50    Types: Cigarettes  . Smokeless tobacco: Never Used  Substance and Sexual Activity  . Alcohol use: No    Alcohol/week: 0.0 standard drinks  . Drug use: Never  . Sexual activity: Not Currently  Lifestyle  . Physical activity    Days per week: Not on file    Minutes per session: Not on file  . Stress: Not on file  Relationships  . Social Herbalist on phone: Not on file    Gets together: Not on file    Attends religious service: Not on file    Active member of club or organization: Not on file    Attends meetings of clubs or organizations: Not on file    Relationship status: Not on file  Other Topics Concern  . Not on file  Social History Narrative  . Not on file    Allergies: No Known Allergies  Metabolic Disorder Labs: Lab Results  Component Value Date   HGBA1C 6.6 (H) 09/09/2018   MPG 142.72 09/09/2018   MPG 142.72 06/20/2018   No results found for: PROLACTIN No results found for: CHOL, TRIG, HDL, CHOLHDL, VLDL, LDLCALC No results found for: TSH  Therapeutic Level Labs: No results found for: LITHIUM No results found for: VALPROATE No components found for:  CBMZ  Current Medications: Current Outpatient Medications  Medication Sig Dispense Refill  . amLODipine (NORVASC) 10 MG tablet Take 10 mg by mouth daily.    Marland Kitchen aspirin 81 MG  tablet Take 81 mg by mouth daily.    Marland Kitchen atorvastatin (LIPITOR) 10 MG tablet Take 10 mg by mouth daily.    Marland Kitchen doxycycline (VIBRAMYCIN) 100 MG capsule Take 1 capsule (100 mg total) by mouth 2 (two) times daily. 28 capsule 1  . gabapentin (NEURONTIN) 300 MG capsule Take 300 mg by mouth 2 (two) times daily.     Marland Kitchen lisinopril-hydrochlorothiazide (PRINZIDE,ZESTORETIC) 20-12.5 MG tablet Take 1 tablet by mouth 2 (two) times daily.     Marland Kitchen omeprazole (PRILOSEC) 20 MG capsule Take 20 mg by mouth daily as needed (heartburn).     . pramipexole (MIRAPEX) 0.125 MG tablet Take 1 tablet (0.125 mg total) by mouth at bedtime for 7 days, THEN 2 tablets (0.25 mg total) at bedtime. 67 tablet 0  . predniSONE (DELTASONE) 50 MG tablet Take one tablet by mouth once daily for 5 days 5 tablet 0  . sertraline (ZOLOFT) 100 MG tablet Take 1.5 tablets (150 mg total) by mouth daily. 135 tablet 0  . SYNJARDY 12.03-999 MG TABS Take 1 tablet by mouth 2 (two) times daily.     Nathen May MAX SOLOSTAR 300 UNIT/ML SOPN Inject 70 Units into the skin 2 (two) times daily.   12  . zolpidem (AMBIEN) 10 MG tablet Take 1 tablet (10 mg total) by mouth at bedtime as needed for sleep. 30 tablet 2   No current facility-administered medications for this visit.       Psychiatric Specialty Exam: Review of Systems  Musculoskeletal: Positive for joint pain.  Psychiatric/Behavioral: The patient has insomnia.   All other systems reviewed and are negative.   There were no vitals taken for this visit.There is no height or weight on file to calculate BMI.  General Appearance: NA  Eye Contact:  NA  Speech:  Clear and Coherent and Normal Rate  Volume:  Normal  Mood:  Euthymic  Affect:  NA  Thought Process:  Goal Directed and Linear  Orientation:  Full (Time, Place, and Person)  Thought Content: Logical   Suicidal Thoughts:  No  Homicidal Thoughts:  No  Memory:  Immediate;   Good Recent;   Good Remote;   Good  Judgement:  Good  Insight:  Good   Psychomotor Activity:  NA  Concentration:  Concentration: Good  Recall:  Good  Fund of Knowledge: Good  Language: Good  Akathisia:  Negative  Handed:  Right  AIMS (if indicated): not done  Assets:  Communication Skills Desire for Improvement Financial Resources/Insurance Housing Resilience  ADL's:  Intact  Cognition: WNL  Sleep:  Fair    Assessment and Plan: 58 yo male with hx of depression and anxiety which worsened to a point of having SI in November last year.He appears to have a seasonal pattern of depression.He has hadfurtherimprovementin mood afterZoloftwas increased to 150 mg.  Ambien did help withinsomnia - he does not take it but occasionally and finds it marginally helpful. He reports having muscle twitches at night which interfere with his sleep. He has been having those for a long time but did not mention them to me before. He no longer takes 25 mg ofhydroxyzine for anxiety.He lives alone, limited if any social support. He denies feeling suicidaland feels that his mood has sufficiently improved so no further med adjustments are necessary at this time.  Dx: MDD in remission (seasonal pattern); RLS  Plan: Continue sertraline 150 mg daily and increase zolpidem to 10 mg as needed for insomnia.Add Mirapex 0.125 mg then 0.25 mg at HS for RLS. Next medication management appointment in 6 weeks.The plan was discussed with patient who had an opportunity to ask questions and these were all answered. I spend 25 minutes in phone consultation with the patient.    Magdalene Patricialgierd A Maxime Beckner, MD 08/22/2019, 10:42 AM

## 2019-08-23 DIAGNOSIS — Z89411 Acquired absence of right great toe: Secondary | ICD-10-CM | POA: Diagnosis not present

## 2019-08-23 DIAGNOSIS — I6522 Occlusion and stenosis of left carotid artery: Secondary | ICD-10-CM | POA: Diagnosis not present

## 2019-08-23 DIAGNOSIS — Z794 Long term (current) use of insulin: Secondary | ICD-10-CM | POA: Diagnosis not present

## 2019-08-23 DIAGNOSIS — E1151 Type 2 diabetes mellitus with diabetic peripheral angiopathy without gangrene: Secondary | ICD-10-CM | POA: Diagnosis not present

## 2019-08-23 DIAGNOSIS — E7800 Pure hypercholesterolemia, unspecified: Secondary | ICD-10-CM | POA: Diagnosis not present

## 2019-08-23 DIAGNOSIS — Z89412 Acquired absence of left great toe: Secondary | ICD-10-CM | POA: Diagnosis not present

## 2019-08-23 DIAGNOSIS — E1140 Type 2 diabetes mellitus with diabetic neuropathy, unspecified: Secondary | ICD-10-CM | POA: Diagnosis not present

## 2019-08-23 DIAGNOSIS — E11319 Type 2 diabetes mellitus with unspecified diabetic retinopathy without macular edema: Secondary | ICD-10-CM | POA: Diagnosis not present

## 2019-08-23 DIAGNOSIS — Z23 Encounter for immunization: Secondary | ICD-10-CM | POA: Diagnosis not present

## 2019-08-23 DIAGNOSIS — G629 Polyneuropathy, unspecified: Secondary | ICD-10-CM | POA: Diagnosis not present

## 2019-08-23 DIAGNOSIS — E11311 Type 2 diabetes mellitus with unspecified diabetic retinopathy with macular edema: Secondary | ICD-10-CM | POA: Diagnosis not present

## 2019-09-11 DIAGNOSIS — L97512 Non-pressure chronic ulcer of other part of right foot with fat layer exposed: Secondary | ICD-10-CM | POA: Diagnosis not present

## 2019-10-04 ENCOUNTER — Other Ambulatory Visit: Payer: Self-pay

## 2019-10-04 ENCOUNTER — Ambulatory Visit (HOSPITAL_COMMUNITY): Payer: Medicare HMO | Admitting: Psychiatry

## 2019-10-11 DIAGNOSIS — L97512 Non-pressure chronic ulcer of other part of right foot with fat layer exposed: Secondary | ICD-10-CM | POA: Diagnosis not present

## 2019-10-11 DIAGNOSIS — L97511 Non-pressure chronic ulcer of other part of right foot limited to breakdown of skin: Secondary | ICD-10-CM | POA: Diagnosis not present

## 2019-10-16 ENCOUNTER — Ambulatory Visit (HOSPITAL_COMMUNITY): Payer: Medicare HMO | Admitting: Psychiatry

## 2019-10-16 ENCOUNTER — Other Ambulatory Visit: Payer: Self-pay

## 2019-10-23 ENCOUNTER — Other Ambulatory Visit: Payer: Self-pay

## 2019-10-23 ENCOUNTER — Ambulatory Visit (INDEPENDENT_AMBULATORY_CARE_PROVIDER_SITE_OTHER): Payer: Medicare HMO | Admitting: Psychiatry

## 2019-10-23 DIAGNOSIS — F3340 Major depressive disorder, recurrent, in remission, unspecified: Secondary | ICD-10-CM

## 2019-10-23 MED ORDER — SERTRALINE HCL 100 MG PO TABS: 150.0000 mg | ORAL_TABLET | Freq: Every day | ORAL | 0 refills | Status: DC

## 2019-10-23 NOTE — Progress Notes (Signed)
BH MD/PA/NP OP Progress Note  10/23/2019 4:09 PM Charles Thompson  MRN:  [id number] Interview was conducted by phone and I verified that I was speaking with the correct person using two identifiers. I discussed the limitations of evaluation and management by telemedicine and  the availability of in person appointments. Patient expressed understanding and agreed to proceed.  Chief Complaint: "I have been doing well".  HPI: 58 yo male with hx of depression and anxiety which worsened to a point of having SI in November last year.He appears to have a seasonal pattern of depression.He has hadfurtherimprovementin mood afterZoloftwas increased to 150 mg. He reports having muscle twitches at night which interfere with his sleep. He has been having those for a long time but did not mention them to me before. He no longer takes 25 mg ofhydroxyzinefor anxiety.He lives alone, limited if any social support. He denies feeling suicidaland feels that his mood has sufficiently improved so no further med adjustments are necessary at this time. He did not like zolpidem and uses melatonin for occasional problems with sleep. We have tried Mirapex 0.25 mg for RLS but he did not see much benefit and stopped it. He says it does not bother him much lately.   Visit Diagnosis:    ICD-10-CM   1. Major depressive disorder, recurrent episode, in remission with seasonal pattern (HCC)  F33.40     Past Psychiatric History: Please see intake H&P.  Past Medical History:  Past Medical History:  Diagnosis Date  . Diabetes mellitus without complication (HCC)   . Hypertension   . Peripheral neuropathy     Past Surgical History:  Procedure Laterality Date  . AMPUTATION Left 06/22/2018   Procedure: LEFT FOOT FIRST RAY AMPUTATION;  Surgeon: Nadara Mustard, MD;  Location: [facility] OR;  Service: Orthopedics;  Laterality: Left;  . AMPUTATION Left 09/09/2018   Procedure: Left Transmetatarsal Amputation;  Surgeon: Nadara Mustard, MD;  Location: [facility] OR;  Service: Orthopedics;  Laterality: Left;  . EYE SURGERY      Family Psychiatric History: None.  Family History: No family history on file.  Social History:  Social History   Socioeconomic History  . Marital status: Single    Spouse name: Not on file  . Number of children: Not on file  . Years of education: Not on file  . Highest education level: Not on file  Occupational History  . Not on file  Social Needs  . Financial resource strain: Not on file  . Food insecurity    Worry: Not on file    Inability: Not on file  . Transportation needs    Medical: Not on file    Non-medical: Not on file  Tobacco Use  . Smoking status: Current Every Day Smoker    Packs/day: 0.50    Types: Cigarettes  . Smokeless tobacco: Never Used  Substance and Sexual Activity  . Alcohol use: No    Alcohol/week: 0.0 standard drinks  . Drug use: Never  . Sexual activity: Not Currently  Lifestyle  . Physical activity    Days per week: Not on file    Minutes per session: Not on file  . Stress: Not on file  Relationships  . Social Musician on phone: Not on file    Gets together: Not on file    Attends religious service: Not on file    Active member of club or organization: Not on file    Attends meetings of clubs  or organizations: Not on file    Relationship status: Not on file  Other Topics Concern  . Not on file  Social History Narrative  . Not on file    Allergies: No Known Allergies  Metabolic Disorder Labs: Lab Results  Component Value Date   HGBA1C 6.6 (H) 09/09/2018   MPG 142.72 09/09/2018   MPG 142.72 06/20/2018   No results found for: PROLACTIN No results found for: CHOL, TRIG, HDL, CHOLHDL, VLDL, LDLCALC No results found for: TSH  Therapeutic Level Labs: No results found for: LITHIUM No results found for: VALPROATE No components found for:  CBMZ  Current Medications: Current Outpatient Medications  Medication Sig Dispense  Refill  . amLODipine (NORVASC) 10 MG tablet Take 10 mg by mouth daily.    Marland Kitchen aspirin 81 MG tablet Take 81 mg by mouth daily.    Marland Kitchen atorvastatin (LIPITOR) 10 MG tablet Take 10 mg by mouth daily.    Marland Kitchen doxycycline (VIBRAMYCIN) 100 MG capsule Take 1 capsule (100 mg total) by mouth 2 (two) times daily. 28 capsule 1  . gabapentin (NEURONTIN) 300 MG capsule Take 300 mg by mouth 2 (two) times daily.     Marland Kitchen lisinopril-hydrochlorothiazide (PRINZIDE,ZESTORETIC) 20-12.5 MG tablet Take 1 tablet by mouth 2 (two) times daily.     Marland Kitchen omeprazole (PRILOSEC) 20 MG capsule Take 20 mg by mouth daily as needed (heartburn).     . pramipexole (MIRAPEX) 0.125 MG tablet Take 1 tablet (0.125 mg total) by mouth at bedtime for 7 days, THEN 2 tablets (0.25 mg total) at bedtime. 67 tablet 0  . predniSONE (DELTASONE) 50 MG tablet Take one tablet by mouth once daily for 5 days 5 tablet 0  . [START ON 11/16/2019] sertraline (ZOLOFT) 100 MG tablet Take 1.5 tablets (150 mg total) by mouth daily. 135 tablet 0  . SYNJARDY 12.03-999 MG TABS Take 1 tablet by mouth 2 (two) times daily.     Nelva Nay MAX SOLOSTAR 300 UNIT/ML SOPN Inject 70 Units into the skin 2 (two) times daily.   12  . zolpidem (AMBIEN) 10 MG tablet Take 1 tablet (10 mg total) by mouth at bedtime as needed for sleep. 30 tablet 2   No current facility-administered medications for this visit.      Psychiatric Specialty Exam: Review of Systems  Neurological:       Leg twitching  All other systems reviewed and are negative.   There were no vitals taken for this visit.There is no height or weight on file to calculate BMI.  General Appearance: NA  Eye Contact:  NA  Speech:  Clear and Coherent and Normal Rate  Volume:  Normal  Mood:  Euthymic  Affect:  NA  Thought Process:  Goal Directed and Linear  Orientation:  Full (Time, Place, and Person)  Thought Content: Logical   Suicidal Thoughts:  No  Homicidal Thoughts:  No  Memory:  Immediate;   Good Recent;    Good Remote;   Good  Judgement:  Good  Insight:  Good  Psychomotor Activity:  NA  Concentration:  Concentration: Good  Recall:  Good  Fund of Knowledge: Good  Language: Good  Akathisia:  Negative  Handed:  Right  AIMS (if indicated): not done  Assets:  Communication Skills Desire for Improvement Financial Resources/Insurance Housing Physical Health Talents/Skills  ADL's:  Intact  Cognition: WNL  Sleep:  Fair    Assessment and Plan: 58 yo male with hx of depression and anxiety which worsened to a  point of having SI in November last year.He appears to have a seasonal pattern of depression.He has hadfurtherimprovementin mood afterZoloftwas increased to 150 mg. He reports having muscle twitches at night which interfere with his sleep. He has been having those for a long time but did not mention them to me before. He no longer takes 25 mg ofhydroxyzinefor anxiety.He lives alone, limited if any social support. He denies feeling suicidaland feels that his mood has sufficiently improved so no further med adjustments are necessary at this time. He did not like zolpidem and uses melatonin for occasional problems with sleep. We have tried Mirapex 0.25 mg for RLS but he did not see much benefit and stopped it. He says it does not bother him much lately.  Dx: MDD in remission (seasonal pattern); RLS  Plan: Continue sertraline 150 mg daily. Next medication management appointment in 3 months.The plan was discussed with patient who had an opportunity to ask questions and these were all answered. I spend25 minutes inphone consultation with the patient.    Magdalene Patricialgierd A Alfonzia Woolum, MD 10/23/2019, 4:09 PM

## 2019-10-31 DIAGNOSIS — B957 Other staphylococcus as the cause of diseases classified elsewhere: Secondary | ICD-10-CM | POA: Diagnosis not present

## 2019-10-31 DIAGNOSIS — B952 Enterococcus as the cause of diseases classified elsewhere: Secondary | ICD-10-CM | POA: Diagnosis not present

## 2019-10-31 DIAGNOSIS — B9562 Methicillin resistant Staphylococcus aureus infection as the cause of diseases classified elsewhere: Secondary | ICD-10-CM | POA: Diagnosis not present

## 2019-10-31 DIAGNOSIS — L97511 Non-pressure chronic ulcer of other part of right foot limited to breakdown of skin: Secondary | ICD-10-CM | POA: Diagnosis not present

## 2019-10-31 DIAGNOSIS — B9689 Other specified bacterial agents as the cause of diseases classified elsewhere: Secondary | ICD-10-CM | POA: Diagnosis not present

## 2019-10-31 DIAGNOSIS — Z1611 Resistance to penicillins: Secondary | ICD-10-CM | POA: Diagnosis not present

## 2019-10-31 DIAGNOSIS — Z1624 Resistance to multiple antibiotics: Secondary | ICD-10-CM | POA: Diagnosis not present

## 2019-10-31 DIAGNOSIS — L97519 Non-pressure chronic ulcer of other part of right foot with unspecified severity: Secondary | ICD-10-CM | POA: Diagnosis not present

## 2019-10-31 DIAGNOSIS — B964 Proteus (mirabilis) (morganii) as the cause of diseases classified elsewhere: Secondary | ICD-10-CM | POA: Diagnosis not present

## 2019-12-08 ENCOUNTER — Other Ambulatory Visit: Payer: Self-pay | Admitting: Podiatry

## 2019-12-08 ENCOUNTER — Ambulatory Visit: Payer: Medicare Other | Admitting: Podiatry

## 2019-12-08 ENCOUNTER — Ambulatory Visit: Payer: Medicare HMO | Admitting: Podiatry

## 2019-12-08 ENCOUNTER — Ambulatory Visit (INDEPENDENT_AMBULATORY_CARE_PROVIDER_SITE_OTHER): Payer: Medicare Other

## 2019-12-08 ENCOUNTER — Other Ambulatory Visit: Payer: Self-pay

## 2019-12-08 VITALS — Temp 97.7°F

## 2019-12-08 DIAGNOSIS — Z89411 Acquired absence of right great toe: Secondary | ICD-10-CM

## 2019-12-08 DIAGNOSIS — E08621 Diabetes mellitus due to underlying condition with foot ulcer: Secondary | ICD-10-CM

## 2019-12-08 DIAGNOSIS — Z89432 Acquired absence of left foot: Secondary | ICD-10-CM

## 2019-12-08 DIAGNOSIS — M21961 Unspecified acquired deformity of right lower leg: Secondary | ICD-10-CM | POA: Diagnosis not present

## 2019-12-08 DIAGNOSIS — L97512 Non-pressure chronic ulcer of other part of right foot with fat layer exposed: Secondary | ICD-10-CM

## 2019-12-08 DIAGNOSIS — Q6671 Congenital pes cavus, right foot: Secondary | ICD-10-CM

## 2019-12-08 DIAGNOSIS — M79671 Pain in right foot: Secondary | ICD-10-CM

## 2019-12-08 NOTE — Progress Notes (Signed)
  Subjective:  Patient ID: Charles Thompson, male    DOB: 17-Jul-1961,  MRN: [id number]  Chief Complaint  Patient presents with  . Wound Check    Pt states right sub 1st wound 2-3 months, has had drainage of blood/possible pus, no known injuries, denies fever/chills/nausea/vomiting.    59 y.o. male presents for wound care. Hx confirmed with patient. Hx left side TMA due to infection. Objective:  Physical Exam: Wound Location: right 1st metatarsal Wound Measurement: 2.5x2 post-debridement Wound Base: Granular/Healthy Peri-wound: Calloused Exudate: Moderate amount Serosanguinous exudate wound without warmth, erythema, signs of acute infection. No probe to bone.  L foot midfoot amputation noted    Radiographs:  X-ray of the right foot: no fracture, dislocation, swelling or degenerative changes noted and no osseous erosion. Hx hallux amputation  Assessment:   1. Diabetic ulcer of other part of right foot associated with diabetes mellitus due to underlying condition, with fat layer exposed (HCC)   2. Cavus deformity of right foot   3. Deformity of metatarsal bone of right foot   4. Status post amputation of great toe, right (HCC)   5. History of transmetatarsal amputation of left foot (HCC)      Plan:  Patient was evaluated and treated and all questions answered.  Ulcer Right 1st Metatarsal -Dressing applied consisting of prisma, sterile gauze, kerlix and ACE bandage -Order wound care supplies from [employer] -Offload ulcer with CAM boot -CAM boot dispensed -Wound cleansed and debrided -May ultimately benefit from TAL right, fat pad supplementation   Procedure: Excisional Debridement of Wound Rationale: Removal of non-viable soft tissue from the wound to promote healing.  Anesthesia: none Pre-Debridement Wound Measurements: 1 cm x 1 cm x 0.4 cm  Post-Debridement Wound Measurements: 2.5 cm x 2 cm x 0.5 cm  Type of Debridement: Sharp Excisional Tissue Removed: Non-viable soft  tissue Depth of Debridement: muscle. Technique: Sharp excisional debridement to bleeding, viable wound base.  Dressing: Dry, sterile, compression dressing. Disposition: Patient tolerated procedure well. Patient to return in 1 week for follow-up.  Return in about 2 weeks (around 12/22/2019) for Wound Care, Right.  MDM Number of Diagnoses or Management Options   Amount and/or Complexity of Data Reviewed Tests in the radiology section of CPT: ordered and reviewed Independent visualization of images, tracings, or specimens: yes  Risk of Complications, Morbidity, and/or Mortality Presenting problems: moderate Management options: moderate

## 2019-12-22 ENCOUNTER — Ambulatory Visit: Payer: Medicare Other | Admitting: Podiatry

## 2019-12-29 ENCOUNTER — Ambulatory Visit: Payer: Medicare Other | Admitting: Podiatry

## 2019-12-29 ENCOUNTER — Other Ambulatory Visit: Payer: Self-pay

## 2019-12-29 DIAGNOSIS — L97512 Non-pressure chronic ulcer of other part of right foot with fat layer exposed: Secondary | ICD-10-CM | POA: Diagnosis not present

## 2019-12-29 DIAGNOSIS — E08621 Diabetes mellitus due to underlying condition with foot ulcer: Secondary | ICD-10-CM

## 2020-01-05 ENCOUNTER — Other Ambulatory Visit: Payer: Self-pay

## 2020-01-05 ENCOUNTER — Ambulatory Visit: Payer: Medicare Other | Admitting: Podiatry

## 2020-01-05 DIAGNOSIS — E08621 Diabetes mellitus due to underlying condition with foot ulcer: Secondary | ICD-10-CM | POA: Diagnosis not present

## 2020-01-05 DIAGNOSIS — L97512 Non-pressure chronic ulcer of other part of right foot with fat layer exposed: Secondary | ICD-10-CM

## 2020-01-12 ENCOUNTER — Other Ambulatory Visit: Payer: Self-pay

## 2020-01-12 ENCOUNTER — Ambulatory Visit: Payer: Medicare Other | Admitting: Podiatry

## 2020-01-12 VITALS — Temp 97.1°F

## 2020-01-12 DIAGNOSIS — E08621 Diabetes mellitus due to underlying condition with foot ulcer: Secondary | ICD-10-CM | POA: Diagnosis not present

## 2020-01-12 DIAGNOSIS — L97512 Non-pressure chronic ulcer of other part of right foot with fat layer exposed: Secondary | ICD-10-CM

## 2020-01-17 ENCOUNTER — Other Ambulatory Visit: Payer: Self-pay

## 2020-01-17 ENCOUNTER — Ambulatory Visit: Payer: Medicare Other | Admitting: Podiatry

## 2020-01-17 DIAGNOSIS — L97512 Non-pressure chronic ulcer of other part of right foot with fat layer exposed: Secondary | ICD-10-CM | POA: Diagnosis not present

## 2020-01-17 DIAGNOSIS — E08621 Diabetes mellitus due to underlying condition with foot ulcer: Secondary | ICD-10-CM | POA: Diagnosis not present

## 2020-01-17 NOTE — Progress Notes (Signed)
  Subjective:  Patient ID: Charles Thompson, male    DOB: 1960/12/09,  MRN: [id number]  Chief Complaint  Patient presents with  . Wound Check    pt is here for a wound vac change, pt shows no signs of infection, pt also states that the right foot is doing alot better as well,    59 y.o. male presents for wound care. Hx confirmed with patient. Wound still doing better starting to have pain and return of sensation. Objective:  Physical Exam: Wound Location: right 1st metatarsal Wound Measurement: 1x5 x 1.5 post-debridement Wound Base: Granular/Healthy Peri-wound: Calloused Exudate: Moderate amount Serosanguinous exudate wound without warmth, erythema, signs of acute infection. No probe to bone.  L foot midfoot amputation noted  Assessment:   1. Diabetic ulcer of other part of right foot associated with diabetes mellitus due to underlying condition, with fat layer exposed (HCC)    Plan:  Patient was evaluated and treated and all questions answered.  Ulcer Right 1st Metatarsal -Continues to improve. Wound debrided and wound VAC reapplied. -Pt to remove dressing Saturday.  Procedure: Selective Debridement of Wound Rationale: Removal of devitalized tissue from the wound to promote healing.  Pre-Debridement Wound Measurements: 1.5x1.5  Post-Debridement Wound Measurements: same as pre-debridement. Type of Debridement: sharp selective Tissue Removed: Devitalized soft-tissue Dressing: Dry, sterile, compression dressing. Disposition: Patient tolerated procedure well. Patient to return in 1 week for follow-up.  Procedure: Mechanical Wound VAC Application Location: left lateral Wound Measurement: 1.5 cm x 1.5 cm x 0.2 cm  Technique: Blue foam to wound base, followed by hydrocolloid 0-Ring, followed by hydrocolloid dressing. Plunger maximally depressed with with good seal noted. Disposition: Patient tolerated procedure well.  No follow-ups on file.

## 2020-01-17 NOTE — Progress Notes (Signed)
  Subjective:  Patient ID: Charles Thompson, male    DOB: 1961/04/13,  MRN: [id number]  Chief Complaint  Patient presents with  . Wound Check    R plantar forefoot submet 1. Pt stated, "It's healing well. No pus, foul odor, fever/chills, or N&V. Not really having pain, but some sensation has returned to my foot.  My blood sugar has been under better control. It was 140mg /dL today".    59 y.o. male presents for wound care. Hx confirmed with patient. States the wound is doing much better.. Objective:  Physical Exam: Wound Location: right 1st metatarsal Wound Measurement: 2.5x2 post-debridement Wound Base: Granular/Healthy Peri-wound: Calloused Exudate: Moderate amount Serosanguinous exudate wound without warmth, erythema, signs of acute infection. No probe to bone.  L foot midfoot amputation noted     Assessment:   1. Diabetic ulcer of other part of right foot associated with diabetes mellitus due to underlying condition, with fat layer exposed (HCC)      Plan:  Patient was evaluated and treated and all questions answered.  Ulcer Right 1st Metatarsal -Improving. Debrided and wound VAC reapplied. Prisma applied to wound bed prior to [facility] application  Procedure: Selective Debridement of Wound Rationale: Removal of devitalized tissue from the wound to promote healing.  Pre-Debridement Wound Measurements: 2 cm x 2 cm x 0.5 cm  Post-Debridement Wound Measurements: same as pre-debridement. Type of Debridement: sharp selective Tissue Removed: Devitalized soft-tissue Dressing: Dry, sterile, compression dressing. Disposition: Patient tolerated procedure well. Patient to return in 1 week for follow-up.   Procedure: Mechanical Wound VAC Application Location: right 1st MPJ Wound Measurement: 2 cm x 2 cm x 0.5 cm  Technique: Blue foam to wound base, followed by hydrocolloid 0-Ring, followed by hydrocolloid dressing. Plunger maximally depressed with with good seal noted. Disposition:  Patient tolerated procedure well.   No follow-ups on file.

## 2020-01-18 ENCOUNTER — Telehealth: Payer: Self-pay | Admitting: *Deleted

## 2020-01-18 NOTE — Telephone Encounter (Signed)
Faxed required 56M * [employer] S[employer] form, demographics to [facility] for evaluation of IVR to see if pre-cert is needed.

## 2020-01-22 ENCOUNTER — Ambulatory Visit (INDEPENDENT_AMBULATORY_CARE_PROVIDER_SITE_OTHER): Payer: Medicare Other | Admitting: Psychiatry

## 2020-01-22 ENCOUNTER — Other Ambulatory Visit: Payer: Self-pay

## 2020-01-22 DIAGNOSIS — F3340 Major depressive disorder, recurrent, in remission, unspecified: Secondary | ICD-10-CM | POA: Diagnosis not present

## 2020-01-22 MED ORDER — SERTRALINE HCL 100 MG PO TABS: 150.0000 mg | ORAL_TABLET | Freq: Every day | ORAL | 1 refills | Status: DC

## 2020-01-22 NOTE — Progress Notes (Signed)
BH MD/PA/NP OP Progress Note  01/22/2020 3:42 PM Charles Thompson  MRN:  [id number] Interview was conducted by phone and I verified that I was speaking with the correct person using two identifiers. I discussed the limitations of evaluation and management by telemedicine and  the availability of in person appointments. Patient expressed understanding and agreed to proceed.  Chief Complaint: Restless legs, muscle twitching.  HPI: 59 yo male with hx of depression and anxiety which worsened to a point of having SI in November last year.He appears to have a seasonal pattern of depression.He has hadfurtherimprovementin mood afterZoloftwas increased to 150 mg. He reports having muscle twitches at night which interfere with his sleep. He has been having those for a long time but did not mention them to me before.Heno longertakes 25 mg ofhydroxyzinefor anxiety.He lives alone, limited if any social support. He denies feeling suicidaland feels that his mood has sufficiently improved so no further med adjustments are necessary at this time. He did not like zolpidem and uses melatonin for occasional problems with sleep. We have tried Mirapex 0.25 mg for RLS but he did not see much benefit and stopped it. He also reports bruxism - not clear if it started/worsened with increased dose of sertraline. He says it does not bother him as much lately. He resumed taking gabapentin for peripheral neuropathy and hopes it may help with muscle twitching as well.   Visit Diagnosis:    ICD-10-CM   1. Major depressive disorder, recurrent episode, in remission with seasonal pattern (HCC)  F33.40     Past Psychiatric History: Please see intake H&P.  Past Medical History:  Past Medical History:  Diagnosis Date  . Diabetes mellitus without complication (HCC)   . Hypertension   . Peripheral neuropathy     Past Surgical History:  Procedure Laterality Date  . AMPUTATION Left 06/22/2018   Procedure: LEFT  FOOT FIRST RAY AMPUTATION;  Surgeon: Nadara Mustard, MD;  Location: [facility] OR;  Service: Orthopedics;  Laterality: Left;  . AMPUTATION Left 09/09/2018   Procedure: Left Transmetatarsal Amputation;  Surgeon: Nadara Mustard, MD;  Location: [facility] OR;  Service: Orthopedics;  Laterality: Left;  . EYE SURGERY      Family Psychiatric History: None  Family History: No family history on file.  Social History:  Social History   Socioeconomic History  . Marital status: Single    Spouse name: Not on file  . Number of children: Not on file  . Years of education: Not on file  . Highest education level: Not on file  Occupational History  . Not on file  Tobacco Use  . Smoking status: Current Every Day Smoker    Packs/day: 0.50    Types: Cigarettes  . Smokeless tobacco: Never Used  Substance and Sexual Activity  . Alcohol use: No    Alcohol/week: 0.0 standard drinks  . Drug use: Never  . Sexual activity: Not Currently  Other Topics Concern  . Not on file  Social History Narrative  . Not on file   Social Determinants of Health   Financial Resource Strain:   . Difficulty of Paying Living Expenses: Not on file  Food Insecurity:   . Worried About Programme researcher, broadcasting/film/video in the Last Year: Not on file  . Ran Out of Food in the Last Year: Not on file  Transportation Needs:   . Lack of Transportation (Medical): Not on file  . Lack of Transportation (Non-Medical): Not on file  Physical Activity:   .  Days of Exercise per Week: Not on file  . Minutes of Exercise per Session: Not on file  Stress:   . Feeling of Stress : Not on file  Social Connections:   . Frequency of Communication with Friends and Family: Not on file  . Frequency of Social Gatherings with Friends and Family: Not on file  . Attends Religious Services: Not on file  . Active Member of Clubs or Organizations: Not on file  . Attends Archivist Meetings: Not on file  . Marital Status: Not on file    Allergies: No Known  Allergies  Metabolic Disorder Labs: Lab Results  Component Value Date   HGBA1C 6.6 (H) 09/09/2018   MPG 142.72 09/09/2018   MPG 142.72 06/20/2018   No results found for: PROLACTIN No results found for: CHOL, TRIG, HDL, CHOLHDL, VLDL, LDLCALC No results found for: TSH  Therapeutic Level Labs: No results found for: LITHIUM No results found for: VALPROATE No components found for:  CBMZ  Current Medications: Current Outpatient Medications  Medication Sig Dispense Refill  . amLODipine (NORVASC) 10 MG tablet Take 10 mg by mouth daily.    Marland Kitchen aspirin 81 MG tablet Take 81 mg by mouth daily.    Marland Kitchen atorvastatin (LIPITOR) 10 MG tablet Take 10 mg by mouth daily.    Marland Kitchen gabapentin (NEURONTIN) 300 MG capsule Take 300 mg by mouth 2 (two) times daily.     . hydrOXYzine (ATARAX/VISTARIL) 25 MG tablet     . Lancets (ONETOUCH DELICA PLUS [id number]) MISC USE TO TEST BLOOD SUGAR THREE TIMES DAILY    . lisinopril-hydrochlorothiazide (PRINZIDE,ZESTORETIC) 20-12.5 MG tablet Take 1 tablet by mouth 2 (two) times daily.     Marland Kitchen omeprazole (PRILOSEC) 20 MG capsule Take 20 mg by mouth daily as needed (heartburn).     Glory Rosebush VERIO test strip USE TO TEST BLOOD SUGAR THREE TIMES DAILY    . SANTYL ointment     . [START ON 02/14/2020] sertraline (ZOLOFT) 100 MG tablet Take 1.5 tablets (150 mg total) by mouth daily. 135 tablet 1  . TOUJEO MAX SOLOSTAR 300 UNIT/ML SOPN Inject 70 Units into the skin 2 (two) times daily.   12   No current facility-administered medications for this visit.     Psychiatric Specialty Exam: Review of Systems  Neurological:       Muscle twitching    There were no vitals taken for this visit.There is no height or weight on file to calculate BMI.  General Appearance: NA  Eye Contact:  NA  Speech:  Clear and Coherent and Normal Rate  Volume:  Normal  Mood:  Euthymic  Affect:  NA  Thought Process:  Goal Directed and Linear  Orientation:  Full (Time, Place, and Person)  Thought  Content: Logical   Suicidal Thoughts:  No  Homicidal Thoughts:  No  Memory:  Immediate;   Good Recent;   Good Remote;   Good  Judgement:  Good  Insight:  Good  Psychomotor Activity:  NA  Concentration:  Concentration: Good  Recall:  Good  Fund of Knowledge: Good  Language: Good  Akathisia:  Negative  Handed:  Right  AIMS (if indicated): not done  Assets:  Communication Skills Desire for Improvement Financial Resources/Insurance Housing  ADL's:  Intact  Cognition: WNL  Sleep:  Fair    Assessment and Plan: 59 yo male with hx of depression and anxiety which worsened to a point of having SI in November last year.He appears to have  a seasonal pattern of depression.He has hadfurtherimprovementin mood afterZoloftwas increased to 150 mg. He reports having muscle twitches at night which interfere with his sleep. He has been having those for a long time but did not mention them to me before.Heno longertakes 25 mg ofhydroxyzinefor anxiety.He lives alone, limited if any social support. He denies feeling suicidaland feels that his mood has sufficiently improved so no further med adjustments are necessary at this time. He did not like zolpidem and uses melatonin for occasional problems with sleep. We have tried Mirapex 0.25 mg for RLS but he did not see much benefit and stopped it. He also reports bruxism - not clear if it started/worsened with increased dose of sertraline. He says it does not bother him as much lately. He resumed taking gabapentin for peripheral neuropathy and hopes it may help with muscle twitching as well.   Dx: MDD in remission (seasonal pattern); RLS  Plan: Continue sertraline 150 mg daily. We discussed an option of trying clonazepam at [facility] for RLS - he will let me know if he wants to try it (should gabapentin not be of much help there). Next medication management appointment in 3 months.The plan was discussed with patient who had an opportunity to ask  questions and these were all answered. I spend20 minutes inphone consultation with the patient.    Magdalene Patricia, MD 01/22/2020, 3:42 PM

## 2020-01-26 ENCOUNTER — Other Ambulatory Visit: Payer: Self-pay

## 2020-01-26 ENCOUNTER — Ambulatory Visit: Payer: Medicare Other | Admitting: Podiatry

## 2020-01-26 VITALS — Temp 96.7°F

## 2020-01-26 DIAGNOSIS — E08621 Diabetes mellitus due to underlying condition with foot ulcer: Secondary | ICD-10-CM | POA: Diagnosis not present

## 2020-01-26 DIAGNOSIS — L97512 Non-pressure chronic ulcer of other part of right foot with fat layer exposed: Secondary | ICD-10-CM

## 2020-01-26 NOTE — Progress Notes (Signed)
  Subjective:  Patient ID: Charles Thompson, male    DOB: 1960-12-25,  MRN: [id number]  Chief Complaint  Patient presents with  . Wound Check    R plantar forefoot, submet 1. Pt stated, "Sensation is returning - I can feel some pain. No foul odors. I cannot see what color the drainage is. I took the wound vac off on Saturday. I am dressing it at home".  . Callouses    Bilateral. Pt requests debridement.  . Diabetes    Pt stated, "They didn't tell my what my HgbA1c was. It was checked in the last 6 months. My last blood sugar was 143mg /dL". A1c results are not available in EPIC.    59 y.o. male presents for wound care. Hx confirmed with patient. Wound continues to improve. Objective:  Physical Exam: Wound Location: right 1st metatarsal Wound Measurement: 1x5 x 1.5 post-debridement Wound Base: Granular/Healthy Peri-wound: Calloused Exudate: Moderate amount Serosanguinous exudate wound without warmth, erythema, signs of acute infection. No probe to bone.  L foot midfoot amputation noted  Assessment:   1. Diabetic ulcer of other part of right foot associated with diabetes mellitus due to underlying condition, with fat layer exposed (HCC)    Plan:  Patient was evaluated and treated and all questions answered.  Ulcer Right 1st Metatarsal -Continues to improve. Continue SNAP VAC therapy -Should wound be flush to skin would consider skin graft application vs skin graft substitute  Procedure: Excisional Debridement of Wound Rationale: Removal of non-viable soft tissue from the wound to promote healing.  Anesthesia: none Pre-Debridement Wound Measurements: 1 cm x 1 cm x 0.2 cm  Post-Debridement Wound Measurements: 1.5 cm x 1.5 cm x 0.2 cm  Type of Debridement: Sharp Excisional Tissue Removed: Non-viable soft tissue Depth of Debridement: subcutaneous tissue. Technique: Sharp excisional debridement to bleeding, viable wound base.  Dressing: Dry, sterile, compression dressing.  Disposition: Patient tolerated procedure well. Patient to return in 1 week for follow-up.   Procedure: Mechanical Wound VAC Application Location: right 1st MPJ Wound Measurement: 1.5 cm x 1.5 cm x 0.2 cm  Technique: Blue foam to wound base, followed by hydrocolloid 0-Ring, followed by hydrocolloid dressing. Plunger maximally depressed with with good seal noted. Disposition: Patient tolerated procedure well.   No follow-ups on file.

## 2020-02-09 ENCOUNTER — Other Ambulatory Visit: Payer: Self-pay

## 2020-02-09 ENCOUNTER — Ambulatory Visit: Payer: Medicare Other | Admitting: Podiatry

## 2020-02-09 DIAGNOSIS — L97512 Non-pressure chronic ulcer of other part of right foot with fat layer exposed: Secondary | ICD-10-CM

## 2020-02-09 DIAGNOSIS — E08621 Diabetes mellitus due to underlying condition with foot ulcer: Secondary | ICD-10-CM

## 2020-02-14 NOTE — Progress Notes (Signed)
  Subjective:  Patient ID: Charles Thompson, male    DOB: 1961-06-13,  MRN: [id number]  Chief Complaint  Patient presents with  . Wound Check    Right sub 1st wound check. Pt states he is healing and denies fever/nausea/vomiting/chills. Pt has no new concerns.    59 y.o. male presents for wound care. Hx confirmed with patient. Hx left side TMA due to infection. Objective:  Physical Exam: Wound Location: right 1st metatarsal Wound Measurement: 2.5x2   Wound Base: Granular/Healthy Peri-wound: Calloused Exudate: Moderate amount Serosanguinous exudate wound without warmth, erythema, signs of acute infection. No probe to bone.  L foot midfoot amputation noted  Radiographs:  X-ray of the right foot: no fracture, dislocation, swelling or degenerative changes noted and no osseous erosion. Hx hallux amputation  Assessment:   1. Diabetic ulcer of other part of right foot associated with diabetes mellitus due to underlying condition, with fat layer exposed (HCC)      Plan:  Patient was evaluated and treated and all questions answered.  Ulcer Right 1st Metatarsal -Dressing applied consisting of prisma, sterile gauze, kerlix and ACE bandage -Order wound care supplies from [employer] -Offload ulcer with CAM boot -CAM boot dispensed -Wound cleansed and debrided -May ultimately benefit from TAL right, fat pad supplementation   Procedure: Excisional Debridement of Wound Rationale: Removal of non-viable soft tissue from the wound to promote healing.  Anesthesia: none Pre-Debridement Wound Measurements: 2 cm x 2 cm x 0.3 cm  Post-Debridement Wound Measurements: 2.5 cm x 2 cm x 0.3 cm  Type of Debridement: Sharp Excisional Tissue Removed: Non-viable soft tissue Depth of Debridement: subcutaneous tissue. Technique: Sharp excisional debridement to bleeding, viable wound base.  Dressing: Dry, sterile, compression dressing. Disposition: Patient tolerated procedure well. Patient to return in 1 week  for follow-up.   Procedure: Mechanical Wound VAC Application Location: right 1st Wound Measurement: 2.5 cm x 2 cm x 0.3 cm  Technique: Blue foam to wound base, followed by hydrocolloid 0-Ring, followed by hydrocolloid dressing. Plunger maximally depressed with with good seal noted. Disposition: Patient tolerated procedure well.   Return in about 1 week (around 01/12/2020) for SNAP VAC change.

## 2020-02-18 NOTE — Progress Notes (Signed)
  Subjective:  Patient ID: Charles Thompson, male    DOB: 10/23/1961,  MRN: [id number]  Chief Complaint  Patient presents with  . Wound Check    R foot. Pt stated, "Doing better. Still bleeding a lot". No pus/foul odor/fever/chills/N&V    59 y.o. male presents for wound care. Hx confirmed with patient. Hx left side TMA due to infection. Objective:  Physical Exam: Wound Location: right 1st metatarsal Wound Measurement: 2x2 postdebridment Wound Base: Granular/Healthy Peri-wound: Calloused Exudate: Moderate amount Serosanguinous exudate wound without warmth, erythema, signs of acute infection. No probe to bone.  L foot midfoot amputation noted  Assessment:   1. Diabetic ulcer of other part of right foot associated with diabetes mellitus due to underlying condition, with fat layer exposed (HCC)      Plan:  Patient was evaluated and treated and all questions answered.  Ulcer Right 1st Metatarsal -Dressing applied consisting of prisma, sterile gauze, kerlix and ACE bandage -Wound cleansed and debrided -May ultimately benefit from TAL right, fat pad supplementation   Procedure: Excisional Debridement of Wound Rationale: Removal of non-viable soft tissue from the wound to promote healing.  Anesthesia: none Pre-Debridement Wound Measurements: 1.5 cm x 1.5 cm x 0.3 cm  Post-Debridement Wound Measurements: 2 cm x 2 cm x 0.3 cm  Type of Debridement: Sharp Excisional Tissue Removed: Non-viable soft tissue Depth of Debridement: subcutaneous tissue. Technique: Sharp excisional debridement to bleeding, viable wound base.  Dressing: Dry, sterile, compression dressing. Disposition: Patient tolerated procedure well. Patient to return in 1 week for follow-up.       Return in about 1 week (around 01/05/2020) for Wound Care, Right.

## 2020-02-23 ENCOUNTER — Ambulatory Visit: Payer: Medicare Other | Admitting: Podiatry

## 2020-02-23 ENCOUNTER — Other Ambulatory Visit: Payer: Self-pay

## 2020-02-23 DIAGNOSIS — M216X1 Other acquired deformities of right foot: Secondary | ICD-10-CM

## 2020-02-23 DIAGNOSIS — M21961 Unspecified acquired deformity of right lower leg: Secondary | ICD-10-CM

## 2020-02-23 DIAGNOSIS — E08621 Diabetes mellitus due to underlying condition with foot ulcer: Secondary | ICD-10-CM

## 2020-02-23 DIAGNOSIS — L97512 Non-pressure chronic ulcer of other part of right foot with fat layer exposed: Secondary | ICD-10-CM | POA: Diagnosis not present

## 2020-02-23 DIAGNOSIS — Q6671 Congenital pes cavus, right foot: Secondary | ICD-10-CM | POA: Diagnosis not present

## 2020-03-01 ENCOUNTER — Other Ambulatory Visit (HOSPITAL_COMMUNITY): Payer: Self-pay | Admitting: Psychiatry

## 2020-03-01 ENCOUNTER — Ambulatory Visit: Payer: Medicare Other | Admitting: Podiatry

## 2020-03-01 ENCOUNTER — Ambulatory Visit: Payer: Self-pay | Admitting: Podiatry

## 2020-03-01 ENCOUNTER — Telehealth (HOSPITAL_COMMUNITY): Payer: Self-pay | Admitting: *Deleted

## 2020-03-01 ENCOUNTER — Other Ambulatory Visit: Payer: Self-pay

## 2020-03-01 VITALS — Temp 97.2°F

## 2020-03-01 DIAGNOSIS — L97512 Non-pressure chronic ulcer of other part of right foot with fat layer exposed: Secondary | ICD-10-CM

## 2020-03-01 DIAGNOSIS — E08621 Diabetes mellitus due to underlying condition with foot ulcer: Secondary | ICD-10-CM

## 2020-03-01 MED ORDER — HYDROXYZINE HCL 25 MG PO TABS: 25.0000 mg | ORAL_TABLET | Freq: Three times a day (TID) | ORAL | 2 refills | Status: AC | PRN

## 2020-03-01 NOTE — Telephone Encounter (Signed)
Pt called asking for refill on Vistaril 25mg . This has been written by you in the past although per your last note pt had stopped taking medication. Feels he needs it again. next appointment on 04/29/20.please review an advise.

## 2020-03-01 NOTE — Telephone Encounter (Signed)
Rx send

## 2020-03-01 NOTE — Patient Instructions (Signed)
Pre-Operative Instructions  Congratulations, you have decided to take an important step towards improving your quality of life.  You can be assured that the doctors and staff at [facility] will be with you every step of the way.  Here are some important things you should know:  1. Plan to be at the surgery center/hospital at least 1 (one) hour prior to your scheduled time, unless otherwise directed by the surgical center/hospital staff.  You must have a responsible adult accompany you, remain during the surgery and drive you home.  Make sure you have directions to the surgical center/hospital to ensure you arrive on time. 2. If you are having surgery at [facility] or [place] hospitals, you will need a copy of your medical history and physical form from your family physician within one month prior to the date of surgery. We will give you a form for your primary physician to complete.  3. We make every effort to accommodate the date you request for surgery.  However, there are times where surgery dates or times have to be moved.  We will contact you as soon as possible if a change in schedule is required.   4. No aspirin/ibuprofen for one week before surgery.  If you are on aspirin, any non-steroidal anti-inflammatory medications (Mobic, Aleve, Ibuprofen) should not be taken seven (7) days prior to your surgery.  You make take Tylenol for pain prior to surgery.  5. Medications - If you are taking daily heart and blood pressure medications, seizure, reflux, allergy, asthma, anxiety, pain or diabetes medications, make sure you notify the surgery center/hospital before the day of surgery so they can tell you which medications you should take or avoid the day of surgery. 6. No food or drink after midnight the night before surgery unless directed otherwise by surgical center/hospital staff. 7. No alcoholic beverages 24-hours prior to surgery.  No smoking 24-hours prior or 24-hours after  surgery. 8. Wear loose pants or shorts. They should be loose enough to fit over bandages, boots, and casts. 9. Don't wear slip-on shoes. Sneakers are preferred. 10. Bring your boot with you to the surgery center/hospital.  Also bring crutches or a walker if your physician has prescribed it for you.  If you do not have this equipment, it will be provided for you after surgery. 11. If you have not been contacted by the surgery center/hospital by the day before your surgery, call to confirm the date and time of your surgery. 12. Leave-time from work may vary depending on the type of surgery you have.  Appropriate arrangements should be made prior to surgery with your employer. 13. Prescriptions will be provided immediately following surgery by your doctor.  Fill these as soon as possible after surgery and take the medication as directed. Pain medications will not be refilled on weekends and must be approved by the doctor. 14. Remove nail polish on the operative foot and avoid getting pedicures prior to surgery. 15. Wash the night before surgery.  The night before surgery wash the foot and leg well with water and the antibacterial soap provided. Be sure to pay special attention to beneath the toenails and in between the toes.  Wash for at least three (3) minutes. Rinse thoroughly with water and dry well with a towel.  Perform this wash unless told not to do so by your physician.  Enclosed: 1 Ice pack (please put in freezer the night before surgery)   1 Hibiclens skin cleaner     Pre-op instructions  If you have any questions regarding the instructions, please do not hesitate to call our office.  [place]: [address], [place], [place] [address] -- [phone]  [place]: [address] , [place] [address] -- [phone]  [place]: [address], [place], [place] [address] -- [phone]   Website: [email or web address] 

## 2020-03-06 ENCOUNTER — Encounter (HOSPITAL_BASED_OUTPATIENT_CLINIC_OR_DEPARTMENT_OTHER): Payer: Self-pay | Admitting: Podiatry

## 2020-03-07 ENCOUNTER — Other Ambulatory Visit: Payer: Self-pay

## 2020-03-07 ENCOUNTER — Encounter (HOSPITAL_BASED_OUTPATIENT_CLINIC_OR_DEPARTMENT_OTHER): Payer: Self-pay | Admitting: Podiatry

## 2020-03-07 NOTE — Progress Notes (Addendum)
Spoke w/ via phone for pre-op interview--- PT Lab needs dos----  Istat 8 and EKG             Lab results------ no COVID test ------ 02-16-2020 @1200  Arrive at ------- 0915 NPO after ------ [place] Medications to take morning of surgery ----- Gabapentin, Lipitor, Norvasc, Lipitor, Prilosec w/ sips of water Diabetic medication ----- do not eo toujeo insulin morning of surgery and night before do half toujeo dose (35 units) Patient Special Instructions ----- n/a Pre-Op special Istructions ----- have not received pt's H&P form yet.  Pt stated today that he took form to pcp office today. Patient verbalized understanding of instructions that were given at this phone interview. Patient denies shortness of breath, chest pain, fever, cough a this phone interview.   Anesthesia Review:  Hx HTN, DM2  PCP: Dr (lov per pt 01/ 2021) Cardiologist : no Chest x-ray : no EKG : 06-22-2018 epic Echo : no Stress test: per pt never had a stress test Cardiac Cath :  no Sleep Study/ CPAP :  NO Fasting Blood Sugar :  100-160    / Checks Blood Sugar -- times a day:  BID Blood Thinner/ Instructions /Last Dose:  NO ASA / Instructions/ Last Dose : ASA 81 mg/  Pt stated no instructions given about to stop or not.  Advised pt to call dr price office and his pcp office for instrucions

## 2020-03-08 ENCOUNTER — Telehealth: Payer: Self-pay

## 2020-03-08 NOTE — Progress Notes (Signed)
  Subjective:  Patient ID: Charles Thompson, male    DOB: 07-21-1961,  MRN: [id number]  Chief Complaint  Patient presents with  . Wound Check    Pt denies constitutional symptoms and states gradual improvement.   Melene Plan    Callous trim    59 y.o. male presents for wound care. Hx confirmed with patient. Wound continues to improve. Objective:  Physical Exam: Wound Location: right 1st metatarsal Wound Measurement: 1x1x0.2 Wound Base: Granular/Healthy Peri-wound: Calloused Exudate: Moderate amount Serosanguinous exudate wound without warmth, erythema, signs of acute infection. No probe to bone. Equinus contracture right ankle  L foot midfoot amputation noted  Assessment:   1. Diabetic ulcer of other part of right foot associated with diabetes mellitus due to underlying condition, with fat layer exposed (HCC)   2. Acquired equinus deformity of right foot   3. Cavus deformity of right foot   4. Deformity of metatarsal bone of right foot    Plan:  Patient was evaluated and treated and all questions answered.  Ulcer Right 1st Metatarsal -Continues to improve. His wound is almost completely flush to the skin. -Excisionally debrided. Cauterized with silver nitrate -Would benefit from wound debridement and application of skin graft substitute to assist in closure. Would benefit from concomitant TAL given excessive forefoot pressure. -Patient has failed all conservative therapy and wishes to proceed with surgical intervention. All risks, benefits, and alternatives discussed with patient. No guarantees given. Consent reviewed and signed by patient. -Planned procedures: right 1st MPJ wound debridement, skin graft substitute application, Tendoachilles lengthening.  Procedure: Excisional Debridement of Wound Rationale: Removal of non-viable soft tissue from the wound to promote healing.  Anesthesia: none Pre-Debridement Wound Measurements: 0.5 cm x 0.5 cm x 0.2 cm  Post-Debridement  Wound Measurements: 1 cm x 1 cm x 0.2 cm  Type of Debridement: Sharp Excisional Tissue Removed: Non-viable soft tissue Depth of Debridement: subcutaneous tissue. Technique: Sharp excisional debridement to bleeding, viable wound base.  Dressing: Dry, sterile, compression dressing. Disposition: Patient tolerated procedure well. Patient to return in 1 week for follow-up.

## 2020-03-08 NOTE — Telephone Encounter (Addendum)
DOS 03/19/2020  DEBRIDEMENT RIGHT FOOT- 11043 TENDOACHILLES LENGTHENING RT - 27606 APPLICATION SKIN GRAFT - [id number]  [employer] EFFECTIVE DATE - 11/17/2019  PLAN DEDUCTIBLE - $0.00 OUT OF POCKET - $6700 W/ $8893.38 REMAINING   CO-INSURANCE 0% / Day OUTPATIENT SURGERY 0% / Day OUTPATIENT HOSPITAL COPAY $395 / Day OUTPATIENT SURGERY $395 / Day OUTPATIENT HOSPITAL  Notification or Prior Authorization is not required for the requested services  This [employer] Medicare Advantage members plan does not currently require a prior authorization for these services. If you have general questions about the prior authorization requirements, please call us at [phone] or visit [email or web address] > Clinician Resources > Advance and Admission Notification Requirements. The number above acknowledges your notification. Please write this number down for future reference. Notification is not a guarantee of coverage or payment.  Decision ID #:[id number]

## 2020-03-09 ENCOUNTER — Other Ambulatory Visit (HOSPITAL_COMMUNITY)
Admission: RE | Admit: 2020-03-09 | Discharge: 2020-03-09 | Disposition: A | Discharge status: Home / Self Care | Payer: Medicare Other | Source: Ambulatory Visit | Attending: Podiatry | Admitting: Podiatry

## 2020-03-09 DIAGNOSIS — Z20822 Contact with and (suspected) exposure to covid-19: Secondary | ICD-10-CM | POA: Diagnosis not present

## 2020-03-09 DIAGNOSIS — Z01812 Encounter for preprocedural laboratory examination: Secondary | ICD-10-CM | POA: Diagnosis present

## 2020-03-09 LAB — SARS CORONAVIRUS 2 (TAT 6-24 HRS): SARS Coronavirus 2: NEGATIVE

## 2020-03-11 NOTE — Progress Notes (Signed)
Requested h and p to be faxed from [facility] for 03-13-2020 surgery with dr price asap.

## 2020-03-12 NOTE — Progress Notes (Signed)
Spoke with Charles Thompson , patient is to be cancelled, due to primary md will not sign off on h and p for 03-13-2020 surgery

## 2020-03-12 NOTE — Progress Notes (Addendum)
req h and p to be faxed for 03-13-2020 surgery for dr price with [facility]

## 2020-03-13 ENCOUNTER — Ambulatory Visit (HOSPITAL_BASED_OUTPATIENT_CLINIC_OR_DEPARTMENT_OTHER): Admission: RE | Admit: 2020-03-13 | Payer: Medicare Other | Source: Home / Self Care | Admitting: Podiatry

## 2020-03-13 HISTORY — DX: Hyperlipidemia, unspecified: E78.5

## 2020-03-13 HISTORY — DX: Unspecified osteoarthritis, unspecified site: M19.90

## 2020-03-13 HISTORY — DX: Atrioventricular block, first degree: I44.0

## 2020-03-13 HISTORY — DX: Major depressive disorder, single episode, unspecified: F32.9

## 2020-03-13 HISTORY — DX: Personal history of other diseases of the musculoskeletal system and connective tissue: Z87.39

## 2020-03-13 HISTORY — DX: Gastro-esophageal reflux disease without esophagitis: K21.9

## 2020-03-13 HISTORY — DX: Type 2 diabetes mellitus with diabetic polyneuropathy: E11.42

## 2020-03-13 HISTORY — DX: Type 2 diabetes mellitus without complications: E11.9

## 2020-03-13 HISTORY — DX: Long term (current) use of insulin: Z79.4

## 2020-03-13 SURGERY — LENGTHENING, TENDON
Anesthesia: Monitor Anesthesia Care | Laterality: Right

## 2020-03-15 ENCOUNTER — Telehealth: Payer: Self-pay | Admitting: *Deleted

## 2020-03-15 ENCOUNTER — Other Ambulatory Visit (HOSPITAL_COMMUNITY)
Admission: RE | Admit: 2020-03-15 | Discharge: 2020-03-15 | Disposition: A | Discharge status: Home / Self Care | Payer: Medicare Other | Source: Ambulatory Visit | Attending: Podiatry | Admitting: Podiatry

## 2020-03-15 ENCOUNTER — Encounter (HOSPITAL_BASED_OUTPATIENT_CLINIC_OR_DEPARTMENT_OTHER): Payer: Self-pay | Admitting: Podiatry

## 2020-03-15 ENCOUNTER — Other Ambulatory Visit: Payer: Self-pay

## 2020-03-15 ENCOUNTER — Encounter (HOSPITAL_BASED_OUTPATIENT_CLINIC_OR_DEPARTMENT_OTHER)
Admission: RE | Admit: 2020-03-15 | Discharge: 2020-03-15 | Disposition: A | Discharge status: Home / Self Care | Payer: Medicare Other | Source: Ambulatory Visit | Attending: Podiatry | Admitting: Podiatry

## 2020-03-15 DIAGNOSIS — E11621 Type 2 diabetes mellitus with foot ulcer: Secondary | ICD-10-CM | POA: Diagnosis not present

## 2020-03-15 DIAGNOSIS — L97519 Non-pressure chronic ulcer of other part of right foot with unspecified severity: Secondary | ICD-10-CM | POA: Diagnosis not present

## 2020-03-15 DIAGNOSIS — Z20822 Contact with and (suspected) exposure to covid-19: Secondary | ICD-10-CM | POA: Diagnosis not present

## 2020-03-15 DIAGNOSIS — Z89411 Acquired absence of right great toe: Secondary | ICD-10-CM | POA: Diagnosis not present

## 2020-03-15 DIAGNOSIS — E785 Hyperlipidemia, unspecified: Secondary | ICD-10-CM | POA: Diagnosis not present

## 2020-03-15 DIAGNOSIS — E11319 Type 2 diabetes mellitus with unspecified diabetic retinopathy without macular edema: Secondary | ICD-10-CM | POA: Diagnosis not present

## 2020-03-15 DIAGNOSIS — Z01818 Encounter for other preprocedural examination: Secondary | ICD-10-CM | POA: Diagnosis not present

## 2020-03-15 DIAGNOSIS — E1151 Type 2 diabetes mellitus with diabetic peripheral angiopathy without gangrene: Secondary | ICD-10-CM | POA: Diagnosis not present

## 2020-03-15 DIAGNOSIS — N182 Chronic kidney disease, stage 2 (mild): Secondary | ICD-10-CM | POA: Diagnosis not present

## 2020-03-15 DIAGNOSIS — I129 Hypertensive chronic kidney disease with stage 1 through stage 4 chronic kidney disease, or unspecified chronic kidney disease: Secondary | ICD-10-CM | POA: Diagnosis not present

## 2020-03-15 DIAGNOSIS — E1122 Type 2 diabetes mellitus with diabetic chronic kidney disease: Secondary | ICD-10-CM | POA: Diagnosis not present

## 2020-03-15 DIAGNOSIS — M6701 Short Achilles tendon (acquired), right ankle: Secondary | ICD-10-CM | POA: Diagnosis present

## 2020-03-15 DIAGNOSIS — Z794 Long term (current) use of insulin: Secondary | ICD-10-CM | POA: Diagnosis not present

## 2020-03-15 DIAGNOSIS — F17210 Nicotine dependence, cigarettes, uncomplicated: Secondary | ICD-10-CM | POA: Diagnosis not present

## 2020-03-15 DIAGNOSIS — Z79899 Other long term (current) drug therapy: Secondary | ICD-10-CM | POA: Diagnosis not present

## 2020-03-15 DIAGNOSIS — Z7982 Long term (current) use of aspirin: Secondary | ICD-10-CM | POA: Diagnosis not present

## 2020-03-15 LAB — BASIC METABOLIC PANEL
Anion gap: 11 (ref 5–15)
BUN: 18 mg/dL (ref 6–20)
CO2: 25 mmol/L (ref 22–32)
Calcium: 9.5 mg/dL (ref 8.9–10.3)
Chloride: 98 mmol/L (ref 98–111)
Creatinine, Ser: 1.2 mg/dL (ref 0.61–1.24)
GFR calc Af Amer: >60 mL/min (ref >60)
GFR calc non Af Amer: >60 mL/min (ref >60)
Glucose, Bld: 201 mg/dL — ABNORMAL HIGH (ref 70–99)
Potassium: 4.4 mmol/L (ref 3.5–5.1)
Sodium: 134 mmol/L — ABNORMAL LOW (ref 135–145)

## 2020-03-15 LAB — SARS CORONAVIRUS 2 (TAT 6-24 HRS): SARS Coronavirus 2: NEGATIVE

## 2020-03-15 NOTE — Progress Notes (Signed)
Pt given ERAS (G2) drink With instruction to Complete drink by 0415 On DOS      Enhanced Recovery after Surgery for Orthopedics Enhanced Recovery after Surgery is a protocol used to improve the stress on your body and your recovery after surgery.  Patient Instructions  . The night before surgery:  o No food after midnight. ONLY clear liquids after midnight  . The day of surgery (if you do NOT have diabetes):  o Drink ONE (1) Pre-Surgery Clear Ensure as directed.   o This drink was given to you during your hospital  pre-op appointment visit. o The pre-op nurse will instruct you on the time to drink the  Pre-Surgery Ensure depending on your surgery time. o Finish the drink at the designated time by the pre-op nurse.  o Nothing else to drink after completing the  Pre-Surgery Clear Ensure.  . The day of surgery (if you have diabetes): o Drink ONE (1) Gatorade 2 (G2) as directed. o This drink was given to you during your hospital  pre-op appointment visit.  o The pre-op nurse will instruct you on the time to drink the   Gatorade 2 (G2) depending on your surgery time. o Color of the Gatorade may vary. Red is not allowed. o Nothing else to drink after completing the  Gatorade 2 (G2).         If you have questions, please contact your surgeon's office.

## 2020-03-15 NOTE — Telephone Encounter (Signed)
[facility] - [facility] Dike states pt's surgery had to be cancelled due to paperwork not being completed, and Dr. Wylene Simmer apologizes, but will complete the paperwork if faxed again directly to [facility] [phone] or [phone]. I told Victor[facility] Dike I would send this message to [facility] - Surgery Coordinator.

## 2020-03-19 ENCOUNTER — Ambulatory Visit (HOSPITAL_BASED_OUTPATIENT_CLINIC_OR_DEPARTMENT_OTHER): Payer: Medicare Other | Admitting: Certified Registered"

## 2020-03-19 ENCOUNTER — Encounter (HOSPITAL_BASED_OUTPATIENT_CLINIC_OR_DEPARTMENT_OTHER)
Admission: RE | Disposition: A | Discharge status: Home / Self Care | Payer: Self-pay | Source: Home / Self Care | Attending: Podiatry

## 2020-03-19 ENCOUNTER — Encounter (HOSPITAL_BASED_OUTPATIENT_CLINIC_OR_DEPARTMENT_OTHER): Payer: Self-pay | Admitting: Podiatry

## 2020-03-19 ENCOUNTER — Ambulatory Visit (HOSPITAL_BASED_OUTPATIENT_CLINIC_OR_DEPARTMENT_OTHER)
Admission: RE | Admit: 2020-03-19 | Discharge: 2020-03-19 | Disposition: A | Discharge status: Home / Self Care | Payer: Medicare Other | Attending: Podiatry | Admitting: Podiatry

## 2020-03-19 ENCOUNTER — Other Ambulatory Visit: Payer: Self-pay

## 2020-03-19 DIAGNOSIS — E785 Hyperlipidemia, unspecified: Secondary | ICD-10-CM | POA: Insufficient documentation

## 2020-03-19 DIAGNOSIS — I129 Hypertensive chronic kidney disease with stage 1 through stage 4 chronic kidney disease, or unspecified chronic kidney disease: Secondary | ICD-10-CM | POA: Insufficient documentation

## 2020-03-19 DIAGNOSIS — E11319 Type 2 diabetes mellitus with unspecified diabetic retinopathy without macular edema: Secondary | ICD-10-CM | POA: Insufficient documentation

## 2020-03-19 DIAGNOSIS — E1122 Type 2 diabetes mellitus with diabetic chronic kidney disease: Secondary | ICD-10-CM | POA: Insufficient documentation

## 2020-03-19 DIAGNOSIS — M6701 Short Achilles tendon (acquired), right ankle: Secondary | ICD-10-CM | POA: Insufficient documentation

## 2020-03-19 DIAGNOSIS — L97519 Non-pressure chronic ulcer of other part of right foot with unspecified severity: Secondary | ICD-10-CM | POA: Insufficient documentation

## 2020-03-19 DIAGNOSIS — E1151 Type 2 diabetes mellitus with diabetic peripheral angiopathy without gangrene: Secondary | ICD-10-CM | POA: Insufficient documentation

## 2020-03-19 DIAGNOSIS — N182 Chronic kidney disease, stage 2 (mild): Secondary | ICD-10-CM | POA: Insufficient documentation

## 2020-03-19 DIAGNOSIS — E08621 Diabetes mellitus due to underlying condition with foot ulcer: Secondary | ICD-10-CM

## 2020-03-19 DIAGNOSIS — Z79899 Other long term (current) drug therapy: Secondary | ICD-10-CM | POA: Insufficient documentation

## 2020-03-19 DIAGNOSIS — L97512 Non-pressure chronic ulcer of other part of right foot with fat layer exposed: Secondary | ICD-10-CM | POA: Diagnosis not present

## 2020-03-19 DIAGNOSIS — Z7982 Long term (current) use of aspirin: Secondary | ICD-10-CM | POA: Insufficient documentation

## 2020-03-19 DIAGNOSIS — Z89411 Acquired absence of right great toe: Secondary | ICD-10-CM | POA: Insufficient documentation

## 2020-03-19 DIAGNOSIS — E11621 Type 2 diabetes mellitus with foot ulcer: Secondary | ICD-10-CM | POA: Insufficient documentation

## 2020-03-19 DIAGNOSIS — M216X1 Other acquired deformities of right foot: Secondary | ICD-10-CM | POA: Diagnosis not present

## 2020-03-19 DIAGNOSIS — Z794 Long term (current) use of insulin: Secondary | ICD-10-CM | POA: Insufficient documentation

## 2020-03-19 DIAGNOSIS — F17210 Nicotine dependence, cigarettes, uncomplicated: Secondary | ICD-10-CM | POA: Insufficient documentation

## 2020-03-19 HISTORY — PX: ALLOGRAFT APPLICATION: SHX6404

## 2020-03-19 HISTORY — DX: Anxiety disorder, unspecified: F41.9

## 2020-03-19 HISTORY — PX: WOUND DEBRIDEMENT: SHX247

## 2020-03-19 HISTORY — PX: TENDON LENGTHENING: SHX6786

## 2020-03-19 LAB — GLUCOSE, CAPILLARY
Glucose-Capillary: 113 mg/dL — ABNORMAL HIGH (ref 70–99)
Glucose-Capillary: 89 mg/dL (ref 70–99)

## 2020-03-19 SURGERY — LENGTHENING, TENDON
Anesthesia: Monitor Anesthesia Care | Site: Foot | Laterality: Right

## 2020-03-19 MED ORDER — CEFAZOLIN SODIUM-DEXTROSE 2-4 GM/100ML-% IV SOLN
2.0000 g | INTRAVENOUS | Status: AC
  Administered 2020-03-19: 2 g via INTRAVENOUS

## 2020-03-19 MED ORDER — OXYCODONE HCL 5 MG/5ML PO SOLN: 5.0000 mg | Freq: Once | ORAL | Status: DC | PRN

## 2020-03-19 MED ORDER — CEPHALEXIN 500 MG PO CAPS
500.0000 mg | ORAL_CAPSULE | Freq: Two times a day (BID) | ORAL | 0 refills | Status: DC
Start: 2020-03-19 — End: 2020-04-18

## 2020-03-19 MED ORDER — LACTATED RINGERS IV SOLN: INTRAVENOUS | Status: DC

## 2020-03-19 MED ORDER — MIDAZOLAM HCL 2 MG/2ML IJ SOLN
INTRAMUSCULAR | Status: AC
  Filled 2020-03-19: qty 2

## 2020-03-19 MED ORDER — FENTANYL CITRATE (PF) 100 MCG/2ML IJ SOLN
INTRAMUSCULAR | Status: DC | PRN
  Administered 2020-03-19 (×2): 50 ug via INTRAVENOUS

## 2020-03-19 MED ORDER — OXYCODONE-ACETAMINOPHEN 5-325 MG PO TABS: 1.0000 | ORAL_TABLET | ORAL | 0 refills | Status: DC | PRN

## 2020-03-19 MED ORDER — PROPOFOL 10 MG/ML IV BOLUS
INTRAVENOUS | Status: DC | PRN
  Administered 2020-03-19 (×2): 10 mg via INTRAVENOUS

## 2020-03-19 MED ORDER — BUPIVACAINE HCL (PF) 0.5 % IJ SOLN
INTRAMUSCULAR | Status: AC
  Filled 2020-03-19: qty 30

## 2020-03-19 MED ORDER — FENTANYL CITRATE (PF) 100 MCG/2ML IJ SOLN
INTRAMUSCULAR | Status: AC
  Filled 2020-03-19: qty 2

## 2020-03-19 MED ORDER — MIDAZOLAM HCL 2 MG/2ML IJ SOLN
1.0000 mg | INTRAMUSCULAR | Status: DC | PRN
  Administered 2020-03-19: 07:00:00 1 mg via INTRAVENOUS

## 2020-03-19 MED ORDER — ONDANSETRON HCL 4 MG/2ML IJ SOLN: 4.0000 mg | Freq: Four times a day (QID) | INTRAMUSCULAR | Status: DC | PRN

## 2020-03-19 MED ORDER — OXYCODONE HCL 5 MG PO TABS: 5.0000 mg | ORAL_TABLET | Freq: Once | ORAL | Status: DC | PRN

## 2020-03-19 MED ORDER — PROPOFOL 500 MG/50ML IV EMUL
INTRAVENOUS | Status: AC
  Filled 2020-03-19: qty 50

## 2020-03-19 MED ORDER — PROPOFOL 500 MG/50ML IV EMUL
INTRAVENOUS | Status: DC | PRN
  Administered 2020-03-19: 75 ug/kg/min via INTRAVENOUS

## 2020-03-19 MED ORDER — ONDANSETRON HCL 4 MG/2ML IJ SOLN
INTRAMUSCULAR | Status: AC
  Filled 2020-03-19: qty 2

## 2020-03-19 MED ORDER — FENTANYL CITRATE (PF) 100 MCG/2ML IJ SOLN: 25.0000 ug | INTRAMUSCULAR | Status: DC | PRN

## 2020-03-19 MED ORDER — FENTANYL CITRATE (PF) 100 MCG/2ML IJ SOLN
50.0000 ug | INTRAMUSCULAR | Status: DC | PRN
  Administered 2020-03-19: 50 ug via INTRAVENOUS

## 2020-03-19 MED ORDER — ONDANSETRON HCL 4 MG/2ML IJ SOLN
INTRAMUSCULAR | Status: DC | PRN
  Administered 2020-03-19: 4 mg via INTRAVENOUS

## 2020-03-19 MED ORDER — MIDAZOLAM HCL 5 MG/5ML IJ SOLN
INTRAMUSCULAR | Status: DC | PRN
  Administered 2020-03-19: 2 mg via INTRAVENOUS

## 2020-03-19 MED ORDER — BUPIVACAINE-EPINEPHRINE (PF) 0.5% -1:200000 IJ SOLN
INTRAMUSCULAR | Status: DC | PRN
  Administered 2020-03-19: 20 mL via PERINEURAL
  Administered 2020-03-19: 15 mL via PERINEURAL

## 2020-03-19 MED ORDER — CEFAZOLIN SODIUM-DEXTROSE 2-4 GM/100ML-% IV SOLN
INTRAVENOUS | Status: AC
  Filled 2020-03-19: qty 100

## 2020-03-19 SURGICAL SUPPLY — 58 items
BLADE HEX COATED 2.75 (ELECTRODE) IMPLANT
BLADE SURG 15 STRL LF DISP TIS (BLADE) ×1 IMPLANT
BLADE SURG 15 STRL SS (BLADE) ×1
BNDG ELASTIC 3X5.8 VLCR STR LF (GAUZE/BANDAGES/DRESSINGS) IMPLANT
BNDG ELASTIC 4X5.8 VLCR STR LF (GAUZE/BANDAGES/DRESSINGS) ×2 IMPLANT
BNDG ELASTIC 6X5.8 VLCR STR LF (GAUZE/BANDAGES/DRESSINGS) ×2 IMPLANT
BNDG ESMARK 4X9 LF (GAUZE/BANDAGES/DRESSINGS) ×2 IMPLANT
BNDG GAUZE ELAST 4 BULKY (GAUZE/BANDAGES/DRESSINGS) ×2 IMPLANT
CHLORAPREP W/TINT 26 (MISCELLANEOUS) IMPLANT
COVER BACK TABLE 60X90IN (DRAPES) ×2 IMPLANT
COVER WAND RF STERILE (DRAPES) IMPLANT
CUFF TOURN SGL QUICK 18X4 (TOURNIQUET CUFF) IMPLANT
DRAPE EXTREMITY T 121X128X90 (DISPOSABLE) ×2 IMPLANT
DRAPE IMP U-DRAPE 54X76 (DRAPES) ×2 IMPLANT
DRAPE U-SHAPE 47X51 STRL (DRAPES) IMPLANT
DRSG EMULSION OIL 3X3 NADH (GAUZE/BANDAGES/DRESSINGS) ×2 IMPLANT
DRSG PAD ABDOMINAL 8X10 ST (GAUZE/BANDAGES/DRESSINGS) IMPLANT
ELECT REM PT RETURN 9FT ADLT (ELECTROSURGICAL) ×2
ELECTRODE REM PT RTRN 9FT ADLT (ELECTROSURGICAL) ×1 IMPLANT
GAUZE 4X4 16PLY RFD (DISPOSABLE) IMPLANT
GAUZE SPONGE 4X4 12PLY STRL (GAUZE/BANDAGES/DRESSINGS) ×2 IMPLANT
GLOVE BIO SURGEON STRL SZ 6.5 (GLOVE) IMPLANT
GLOVE BIOGEL PI IND STRL 6.5 (GLOVE) IMPLANT
GLOVE BIOGEL PI IND STRL 7.0 (GLOVE) ×1 IMPLANT
GLOVE BIOGEL PI INDICATOR 6.5 (GLOVE)
GLOVE BIOGEL PI INDICATOR 7.0 (GLOVE) ×1
GLOVE ECLIPSE 6.5 STRL STRAW (GLOVE) ×2 IMPLANT
GOWN STRL REUS W/ TWL LRG LVL3 (GOWN DISPOSABLE) ×1 IMPLANT
GOWN STRL REUS W/ TWL XL LVL3 (GOWN DISPOSABLE) ×1 IMPLANT
GOWN STRL REUS W/TWL LRG LVL3 (GOWN DISPOSABLE) ×1
GOWN STRL REUS W/TWL XL LVL3 (GOWN DISPOSABLE) ×1
MANIFOLD NEPTUNE II (INSTRUMENTS) ×2 IMPLANT
MATRIX WOUND MESHED 2X2 (Tissue) ×1 IMPLANT
NEEDLE HYPO 25X1 1.5 SAFETY (NEEDLE) ×2 IMPLANT
NS IRRIG 1000ML POUR BTL (IV SOLUTION) ×2 IMPLANT
PADDING CAST ABS 4INX4YD NS (CAST SUPPLIES) ×1
PADDING CAST ABS COTTON 4X4 ST (CAST SUPPLIES) ×1 IMPLANT
PENCIL SMOKE EVACUATOR (MISCELLANEOUS) ×2 IMPLANT
SET BASIN DAY SURGERY F.S. (CUSTOM PROCEDURE TRAY) ×2 IMPLANT
SET IRRIG Y TYPE TUR BLADDER L (SET/KITS/TRAYS/PACK) IMPLANT
SLEEVE SCD COMPRESS KNEE MED (MISCELLANEOUS) IMPLANT
STAPLER VISISTAT (STAPLE) ×2 IMPLANT
STOCKINETTE 6  STRL (DRAPES) ×1
STOCKINETTE 6 STRL (DRAPES) ×1 IMPLANT
STOCKINETTE SYNTHETIC 4 NONSTR (MISCELLANEOUS) IMPLANT
SUT ETHILON 4 0 PS 2 18 (SUTURE) IMPLANT
SUT MNCRL AB 3-0 PS2 18 (SUTURE) IMPLANT
SUT MNCRL AB 4-0 PS2 18 (SUTURE) IMPLANT
SUT MON AB 5-0 PS2 18 (SUTURE) IMPLANT
SUT VIC AB 3-0 FS2 27 (SUTURE) IMPLANT
SUT VICRYL 4-0 PS2 18IN ABS (SUTURE) IMPLANT
SYR BULB EAR ULCER 3OZ GRN STR (SYRINGE) ×2 IMPLANT
SYR CONTROL 10ML LL (SYRINGE) ×2 IMPLANT
TOWEL GREEN STERILE FF (TOWEL DISPOSABLE) ×2 IMPLANT
TRAY DSU PREP LF (CUSTOM PROCEDURE TRAY) ×2 IMPLANT
UNDERPAD 30X36 HEAVY ABSORB (UNDERPADS AND DIAPERS) ×2 IMPLANT
WOUND MATRIX MESHED 2X2 (Tissue) ×1 IMPLANT
YANKAUER SUCT BULB TIP NO VENT (SUCTIONS) ×2 IMPLANT

## 2020-03-19 NOTE — Anesthesia Procedure Notes (Signed)
Anesthesia Regional Block: Popliteal block   Pre-Anesthetic Checklist: ,, timeout performed, Correct Patient, Correct Site, Correct Laterality, Correct Procedure, Correct Position, site marked, Risks and benefits discussed,  Surgical consent,  Pre-op evaluation,  At surgeon's request and post-op pain management  Laterality: Right  Prep: chloraprep       Needles:  Injection technique: Single-shot  Needle Type: Echogenic Stimulator Needle          Additional Needles:   Procedures:, nerve stimulator,,,,,,,   Nerve Stimulator or Paresthesia:  Response: plantar flexion of foot, 0.45 mA,   Additional Responses:   Narrative:  Start time: 03/19/2020 7:12 AM End time: 03/19/2020 7:17 AM Injection made incrementally with aspirations every 5 mL.  Performed by: Personally  Anesthesiologist: Achille Rich, MD  Additional Notes: Functioning IV was confirmed and monitors were applied.  A 35mm 21ga Arrow echogenic stimulator needle was used. Sterile prep and drape,hand hygiene and sterile gloves were used.  Negative aspiration and negative test dose prior to incremental administration of local anesthetic. The patient tolerated the procedure well.  Ultrasound guidance: relevent anatomy identified, needle position confirmed, local anesthetic spread visualized around nerve(s), vascular puncture avoided.  Image printed for medical record.

## 2020-03-19 NOTE — Progress Notes (Signed)
Assisted Dr. Hodierne with right, ultrasound guided, popliteal, adductor canal block. Side rails up, monitors on throughout procedure. See vital signs in flow sheet. Tolerated Procedure well. 

## 2020-03-19 NOTE — Anesthesia Postprocedure Evaluation (Signed)
Anesthesia Post Note  Patient: Tyron Manetta  Procedure(s) Performed: TENDOACHILLES LENGTHENING (Right Foot) DEBRIDEMENT WOUND (Right Foot) APPLICATION OF INTEGRA (Right Foot)     Patient location during evaluation: PACU Anesthesia Type: Regional and MAC Level of consciousness: awake and alert Pain management: pain level controlled Vital Signs Assessment: post-procedure vital signs reviewed and stable Respiratory status: spontaneous breathing, nonlabored ventilation, respiratory function stable and patient connected to nasal cannula oxygen Cardiovascular status: stable and blood pressure returned to baseline Postop Assessment: no apparent nausea or vomiting Anesthetic complications: no    Last Vitals:  Vitals:   03/19/20 0826 03/19/20 0840  BP: 137/75 (!) 145/73  Pulse: 66 70  Resp: 15 16  Temp:  36.5 C  SpO2: 98% 99%    Last Pain:  Vitals:   03/19/20 0925  TempSrc:   PainSc: 0-No pain                 Jalayah Gutridge S

## 2020-03-19 NOTE — Anesthesia Preprocedure Evaluation (Signed)
Anesthesia Evaluation  Patient identified by MRN, date of birth, ID band Patient awake    Reviewed: Allergy & Precautions, H&P , NPO status , Patient's Chart, lab work & pertinent test results  Airway Mallampati: II   Neck ROM: full    Dental   Pulmonary Current Smoker and Patient abstained from smoking.,    breath sounds clear to auscultation       Cardiovascular hypertension,  Rhythm:regular Rate:Normal     Neuro/Psych PSYCHIATRIC DISORDERS Anxiety Depression  Neuromuscular disease    GI/Hepatic GERD  ,  Endo/Other  diabetes, Type 2  Renal/GU      Musculoskeletal  (+) Arthritis ,   Abdominal   Peds  Hematology   Anesthesia Other Findings   Reproductive/Obstetrics                            Anesthesia Physical Anesthesia Plan  ASA: II  Anesthesia Plan: Regional and MAC   Post-op Pain Management:    Induction: Intravenous  PONV Risk Score and Plan: 2 and Propofol infusion, Midazolam, Ondansetron and Treatment may vary due to age or medical condition  Airway Management Planned: Simple Face Mask  Additional Equipment:   Intra-op Plan:   Post-operative Plan:   Informed Consent: I have reviewed the patients History and Physical, chart, labs and discussed the procedure including the risks, benefits and alternatives for the proposed anesthesia with the patient or authorized representative who has indicated his/her understanding and acceptance.       Plan Discussed with: CRNA, Surgeon and Anesthesiologist  Anesthesia Plan Comments:         Anesthesia Quick Evaluation

## 2020-03-19 NOTE — Transfer of Care (Signed)
Immediate Anesthesia Transfer of Care Note  Patient: Charles Thompson  Procedure(s) Performed: TENDOACHILLES LENGTHENING (Right Foot) DEBRIDEMENT WOUND (Right Foot) APPLICATION OF INTEGRA (Right Foot)  Patient Location: PACU  Anesthesia Type:MAC combined with regional for post-op pain  Level of Consciousness: awake, alert  and oriented  Airway & Oxygen Therapy: Patient Spontanous Breathing and Patient connected to face mask oxygen  Post-op Assessment: Report given to RN and Post -op Vital signs reviewed and stable  Post vital signs: Reviewed and stable  Last Vitals:  Vitals Value Taken Time  BP 124/77 03/19/20 0812  Temp    Pulse 73 03/19/20 0815  Resp 20 03/19/20 0815  SpO2 97 % 03/19/20 0815  Vitals shown include unvalidated device data.  Last Pain:  Vitals:   03/19/20 0659  TempSrc: Oral  PainSc: 0-No pain         Complications: No apparent anesthesia complications

## 2020-03-19 NOTE — Discharge Instructions (Signed)
Post-Surgery Instructions  1. If you are recuperating from surgery anywhere other than home, please be sure to leave [place] a number where you can be reached. 2. Go directly home and rest. 3. The keep operated foot (or feet) elevated six inches above the hip when sitting or lying down. 4. Support the elevated foot and leg with pillows under the calf. DO NOT PLACE PILLOWS UNDER THE KNEE. 5. DO NOT REMOVE or get your bandages wet. This will increase your chances of getting an infection. 6. Wear your surgical shoe at all times when you are up. 7. A limited amount of pain and swelling may occur. The skin may take on a bruised appearance. This is no cause for alarm. 8. For slight pain and swelling, apply an ice pack directly over the bandage for 15 minutes every hour. Continue icing until seen in the office. DO NOT apply any form of heat to the area. 9. Have prescription(s) filled immediately and take as directed. 10. Drink lots of liquids, water, and juice. 11. CALL THE OFFICE IMMEDIATELY IF: a. Bleeding continues b. Pain increases and/or does not respond to medication c. Bandage or cast appears too tight d. Any liquids (water, coffee, etc.) have spilled on your bandages. e. Tripping, falling, or stubbing the surgical foot f. If your temperature rises above 101 g. If you have ANY questions at all 12.  You are expected to be: x weight-bearing ? non-weight bearing in your boot. 13. Special Instructions: _____________________________________________________________ _________________________________________________________________________________ _________________________________________________________________________________  If you need to reach the nurse for any reason, please call: [place]: [phone] [place]: [phone] [place]: [phone]     Post Anesthesia Home Care Instructions  Activity: Get plenty of rest for the remainder of the day. A  responsible individual must stay with you for 24 hours following the procedure.  For the next 24 hours, DO NOT: -Drive a car -Paediatric nurse -Drink alcoholic beverages -Take any medication unless instructed by your physician -Make any legal decisions or sign important papers.  Meals: Start with liquid foods such as gelatin or soup. Progress to regular foods as tolerated. Avoid greasy, spicy, heavy foods. If nausea and/or vomiting occur, drink only clear liquids until the nausea and/or vomiting subsides. Call your physician if vomiting continues.  Special Instructions/Symptoms: Your throat may feel dry or sore from the anesthesia or the breathing tube placed in your throat during surgery. If this causes discomfort, gargle with warm salt water. The discomfort should disappear within 24 hours.  If you had a scopolamine patch placed behind your ear for the management of post- operative nausea and/or vomiting:  1. The medication in the patch is effective for 72 hours, after which it should be removed.  Wrap patch in a tissue and discard in the trash. Wash hands thoroughly with soap and water. 2. You may remove the patch earlier than 72 hours if you experience unpleasant side effects which may include dry mouth, dizziness or visual disturbances. 3. Avoid touching the patch. Wash your hands with soap and water after contact with the patch.       Regional Anesthesia Blocks  1. Numbness or the inability to move the "blocked" extremity may last from 3-48 hours after placement. The length of time depends on the medication injected and your individual response to the medication. If the numbness is not going away after 48 hours, call your surgeon.  2. The extremity that is blocked will need to be protected until the numbness is gone and  the  Strength has returned. Because you cannot feel it, you will need to take extra care to avoid injury. Because it may be weak, you may have difficulty moving it  or using it. You may not know what position it is in without looking at it while the block is in effect.  3. For blocks in the legs and feet, returning to weight bearing and walking needs to be done carefully. You will need to wait until the numbness is entirely gone and the strength has returned. You should be able to move your leg and foot normally before you try and bear weight or walk. You will need someone to be with you when you first try to ensure you do not fall and possibly risk injury.  4. Bruising and tenderness at the needle site are common side effects and will resolve in a few days.  5. Persistent numbness or new problems with movement should be communicated to the surgeon or the [facility] [phone] [facility] [phone]).

## 2020-03-19 NOTE — Anesthesia Procedure Notes (Signed)
Anesthesia Regional Block: Adductor canal block   Pre-Anesthetic Checklist: ,, timeout performed, Correct Patient, Correct Site, Correct Laterality, Correct Procedure, Correct Position, site marked, Risks and benefits discussed,  Surgical consent,  Pre-op evaluation,  At surgeon's request and post-op pain management  Laterality: Right  Prep: chloraprep       Needles:  Injection technique: Single-shot  Needle Type: Echogenic Needle     Needle Length: 9cm  Needle Gauge: 21     Additional Needles:   Narrative:  Start time: 03/19/2020 7:18 AM End time: 03/19/2020 7:23 AM Injection made incrementally with aspirations every 5 mL.  Performed by: Personally  Anesthesiologist: Achille Rich, MD  Additional Notes: Pt tolerated the procedure well.

## 2020-03-19 NOTE — Op Note (Signed)
Patient Name: Charles Thompson DOB: 1961-09-15  MRN: [id number]   Date of Service: 03/19/2020  Surgeon: Dr. Hardie Pulley, DPM Assistants: None Pre-operative Diagnosis:  Equinus right ankle, diabetic ulcer right foot Post-operative Diagnosis:  Same Procedures:  1) Tendoachilles lengthening right  2) Debridement of right foot ulcer  3) Application of wound graft Pathology/Specimens: * No specimens in log * Anesthesia: MAC/local Hemostasis: * No tourniquets in log * Estimated Blood Loss: 5 ml Materials:  Implant Name Type Inv. Item Serial No. Manufacturer Lot No. LRB No. Used Action  WOUND MATRIX MESHED 2X2 - [id number] Tissue WOUND MATRIX MESHED 2X2  [employer] [id number] Right 1 Implanted   Medications: none Complications: none  Indications for Procedure:  This is a 59 y.o. male with a chronic right foot ulcer and underlying equinus deformity. He presents for surgical intervention after failing local wound care.   Procedure in Detail: Patient was identified in pre-operative holding area. Formal consent was signed and the right lower extremity was marked. Patient was brought back to the operating room. Anesthesia was induced. The extremity was prepped and draped in the usual sterile fashion. Timeout was taken to confirm patient name, laterality, and procedure prior to incision.   Attention was directed first to the posterior aspect of the right calf.  An incision was made at the central aspect of the Achilles tendon 3 cm proximal to the Achilles tendon insertion.  The blade was then rotated medially and the Achilles tendon was hemi-transected.  An additional incision was made 2 cm proximal to this an incision was again made and the blade was rotated laterally.  A final hemi-transection was performed an additional 2 cm proximal to the central incision with the blade being rotated medially.   The ankle was then thoroughly manipulated to stretch the Achilles tendon and the resultant  ankle was then was able to be brought to about 10 degrees of dorsiflexion.  The incisions were then copiously irrigated and closed with skin staples.  Attention was then directed to the right forefoot. There was an ulcer about the 1st Metatarsal measuring 1.5x1.5x0.5 with hyperkeratotic rim. The wound was sharply excisionally debrided with a 15 blade to bleeding viable wound base. The wound post-debridement measured 2x2x0.5. The wound was debrided to the fascial layer.  An integra bilayer graft was hydrated, applied to the wound and adhered with skin staples. Excess graft was excised with scissors.  The foot was then dressed with adaptic, 4x4, kerlix, ACE bandage. Patient tolerated the procedure well.   Disposition: Following a period of post-operative monitoring, patient will be transferred home.Marland Kitchen

## 2020-03-20 ENCOUNTER — Encounter: Payer: Self-pay | Admitting: *Deleted

## 2020-03-21 ENCOUNTER — Encounter: Payer: Medicare Other | Admitting: Podiatry

## 2020-03-28 ENCOUNTER — Other Ambulatory Visit: Payer: Self-pay

## 2020-03-28 ENCOUNTER — Encounter: Payer: Medicare Other | Admitting: Podiatry

## 2020-03-28 ENCOUNTER — Ambulatory Visit (INDEPENDENT_AMBULATORY_CARE_PROVIDER_SITE_OTHER): Payer: Medicare Other | Admitting: Podiatry

## 2020-03-28 DIAGNOSIS — Z9889 Other specified postprocedural states: Secondary | ICD-10-CM

## 2020-03-28 DIAGNOSIS — L97512 Non-pressure chronic ulcer of other part of right foot with fat layer exposed: Secondary | ICD-10-CM

## 2020-03-28 DIAGNOSIS — E08621 Diabetes mellitus due to underlying condition with foot ulcer: Secondary | ICD-10-CM

## 2020-04-04 ENCOUNTER — Other Ambulatory Visit: Payer: Self-pay

## 2020-04-04 ENCOUNTER — Ambulatory Visit (INDEPENDENT_AMBULATORY_CARE_PROVIDER_SITE_OTHER): Payer: Medicare Other | Admitting: Podiatry

## 2020-04-04 DIAGNOSIS — L97512 Non-pressure chronic ulcer of other part of right foot with fat layer exposed: Secondary | ICD-10-CM | POA: Diagnosis not present

## 2020-04-04 DIAGNOSIS — E08621 Diabetes mellitus due to underlying condition with foot ulcer: Secondary | ICD-10-CM

## 2020-04-09 ENCOUNTER — Telehealth (HOSPITAL_COMMUNITY): Payer: Self-pay

## 2020-04-09 NOTE — Telephone Encounter (Signed)
This is Dr. Stanton Kidney patient. Patient came to the office and stated that he gets severe arm and leg twitches when he's lying down. He stated that he can't sleep and it keeps him up at night. I observed him when I was talking to him and he didn't display any twitching when he was seated in the chair talking to me so I think it just happens at night. Please review and advise. Thank you.

## 2020-04-09 NOTE — Telephone Encounter (Signed)
I reviewed his chart.  Dr. Hinton Dyer started him on hydroxyzine and make sure that he is taking every day which can help twitching.  It looks like that Dr. Hinton Dyer felt he has restless leg syndrome.  We can try Requip 0.25 mg at bedtime if he agree then please call his pharmacy.  If he do not see any improvement then either he had to cut down his Zoloft dose or see neurology.

## 2020-04-11 ENCOUNTER — Other Ambulatory Visit (HOSPITAL_COMMUNITY): Payer: Self-pay

## 2020-04-11 ENCOUNTER — Encounter: Payer: Medicare Other | Admitting: Podiatry

## 2020-04-12 ENCOUNTER — Other Ambulatory Visit (HOSPITAL_COMMUNITY): Payer: Self-pay

## 2020-04-12 NOTE — Telephone Encounter (Signed)
Tried to send in Requip 0.25mg  per doctor. Doctor stated he wants it sent in 0.25mg  qhs. I can send it in as 0.25mg  TID (3/day) OR a higher dosage qhs (at bedtime) but database won't let me send in 0.25mg  qhs. Please review and advise. Thank you.

## 2020-04-16 MED ORDER — ROPINIROLE HCL 0.25 MG PO TABS: 0.2500 mg | ORAL_TABLET | Freq: Every day | ORAL | 0 refills | Status: DC

## 2020-04-16 NOTE — Telephone Encounter (Signed)
Send Requip 0.5 mg to [facility] at [address].

## 2020-04-18 ENCOUNTER — Ambulatory Visit (INDEPENDENT_AMBULATORY_CARE_PROVIDER_SITE_OTHER): Payer: Medicare Other | Admitting: Podiatry

## 2020-04-18 ENCOUNTER — Other Ambulatory Visit: Payer: Self-pay

## 2020-04-18 VITALS — Temp 96.8°F

## 2020-04-18 DIAGNOSIS — E08621 Diabetes mellitus due to underlying condition with foot ulcer: Secondary | ICD-10-CM

## 2020-04-18 DIAGNOSIS — L97512 Non-pressure chronic ulcer of other part of right foot with fat layer exposed: Secondary | ICD-10-CM

## 2020-04-29 ENCOUNTER — Other Ambulatory Visit (HOSPITAL_COMMUNITY): Payer: Self-pay | Admitting: Psychiatry

## 2020-04-29 ENCOUNTER — Other Ambulatory Visit: Payer: Self-pay

## 2020-04-29 ENCOUNTER — Telehealth (INDEPENDENT_AMBULATORY_CARE_PROVIDER_SITE_OTHER): Payer: Medicare Other | Admitting: Psychiatry

## 2020-04-29 DIAGNOSIS — F339 Major depressive disorder, recurrent, unspecified: Secondary | ICD-10-CM | POA: Diagnosis not present

## 2020-04-29 MED ORDER — ROPINIROLE HCL 1 MG PO TABS: 1.0000 mg | ORAL_TABLET | Freq: Every day | ORAL | 1 refills | Status: DC

## 2020-04-29 NOTE — Progress Notes (Signed)
BH MD/PA/NP OP Progress Note  04/29/2020 3:41 PM Charles Thompson  MRN:  [id number] Interview was conducted by phone and I verified that I was speaking with the correct person using two identifiers. I discussed the limitations of evaluation and management by telemedicine and  the availability of in person appointments. Patient expressed understanding and agreed to proceed. Patient location - home; physician - home office.  Chief Complaint: Some depression/anxiety, restless legs.  HPI: 60yo male with hx of depression and anxiety which worsened to a point of having SI in November last year.He appears to have a seasonal pattern of depression.He has hadfurtherimprovementin mood afterZoloftwas increased to 150 mg. He reports having muscle twitches at night which interfere with his sleep. He has been having those for a long time but did not mention them to me before.Heno longertakes 25 mg ofhydroxyzinefor anxiety.He lives alone, limited if any social support. He denies feeling suicidaland feels that his mood has sufficiently improved so no further med adjustments are necessary at this time.He did not like zolpidem and uses melatonin for occasional problems with sleep. We have tried Mirapex 0.25 mg for RLS but he did not see much benefit and stopped it. He was started on Requip 0.25 mg by Dr. Lolly Mustache two weeks ago - no clear benefit. He also takes hydroxyzine as needed for anxiety. He resumed taking gabapentin for peripheral neuropathy and hopes it may help with muscle twitching as well.   Visit Diagnosis:    ICD-10-CM   1. Major depressive disorder, recurrent episode with seasonal pattern (HCC)  F33.9     Past Psychiatric History: Please see intake H&P.  Past Medical History:  Past Medical History:  Diagnosis Date  . Anxiety   . Arthritis    neck  . Chronic ulcer of right foot (HCC)   . Diabetic peripheral neuropathy (HCC)   . First degree heart block   . GERD  (gastroesophageal reflux disease)   . History of osteomyelitis    bilateral feet  . Hyperlipidemia   . Hypertension    followed by pcp  (03-07-2020 per pt never had a stress test)  . MDD (major depressive disorder)   . Type 2 diabetes mellitus with insulin therapy (HCC)    followed by pcp   (03-07-2020  pt stated checks blood sugar twice dialy,  fasting sugar--- 100-160)    Past Surgical History:  Procedure Laterality Date  . ALLOGRAFT APPLICATION Right 03/19/2020   Procedure: APPLICATION OF [employer]TE[employer]RA;  Surgeon: Park Liter, DPM;  Location: [facility];  Service: Podiatry;  Laterality: Right;  . AMPUTATION Left 06/22/2018   Procedure: LEFT FOOT FIRST RAY AMPUTATION;  Surgeon: Nadara Mustard, MD;  Location: [facility] OR;  Service: Orthopedics;  Laterality: Left;  . AMPUTATION Left 09/09/2018   Procedure: Left Transmetatarsal Amputation;  Surgeon: Nadara Mustard, MD;  Location: [facility] OR;  Service: Orthopedics;  Laterality: Left;  . EYE SURGERY    . TENDON LENGTHENING Right 03/19/2020   Procedure: TENDOACHILLES LENGTHENING;  Surgeon: Park Liter, DPM;  Location: [facility];  Service: Podiatry;  Laterality: Right;  . TOE AMPUTATION Right 12-11-2015  @[facility]    great toe  . WOUND DEBRIDEMENT Right 03/19/2020   Procedure: DEBRIDEMENT WOUND;  Surgeon: 05/19/2020, DPM;  Location: [facility];  Service: Podiatry;  Laterality: Right;    Family Psychiatric History: None.  Family History: No family history on file.  Social History:  Social History   Socioeconomic History  .  Marital status: Single    Spouse name: Not on file  . Number of children: Not on file  . Years of education: Not on file  . Highest education level: Not on file  Occupational History  . Not on file  Tobacco Use  . Smoking status: Current Every Day Smoker    Packs/day: 0.75    Types: Cigarettes  . Smokeless tobacco: Never Used  Vaping Use  . Vaping Use: Never used  Substance  and Sexual Activity  . Alcohol use: Yes    Alcohol/week: 0.0 standard drinks    Comment: occ  . Drug use: Not Currently    Types: Marijuana    Comment: "not lately"  . Sexual activity: Not Currently  Other Topics Concern  . Not on file  Social History Narrative  . Not on file   Social Determinants of Health   Financial Resource Strain:   . Difficulty of Paying Living Expenses:   Food Insecurity:   . Worried About Charity fundraiser in the Last Year:   . Arboriculturist in the Last Year:   Transportation Needs:   . Film/video editor (Medical):   Marland Kitchen Lack of Transportation (Non-Medical):   Physical Activity:   . Days of Exercise per Week:   . Minutes of Exercise per Session:   Stress:   . Feeling of Stress :   Social Connections:   . Frequency of Communication with Friends and Family:   . Frequency of Social Gatherings with Friends and Family:   . Attends Religious Services:   . Active Member of Clubs or Organizations:   . Attends Archivist Meetings:   Marland Kitchen Marital Status:     Allergies: No Known Allergies  Metabolic Disorder Labs: Lab Results  Component Value Date   HGBA1C 6.6 (H) 09/09/2018   MPG 142.72 09/09/2018   MPG 142.72 06/20/2018   No results found for: PROLACTIN No results found for: CHOL, TRIG, HDL, CHOLHDL, VLDL, LDLCALC No results found for: TSH  Therapeutic Level Labs: No results found for: LITHIUM No results found for: VALPROATE No components found for:  CBMZ  Current Medications: Current Outpatient Medications  Medication Sig Dispense Refill  . amLODipine (NORVASC) 10 MG tablet Take 10 mg by mouth daily.     Marland Kitchen aspirin 81 MG tablet Take 81 mg by mouth daily.    Marland Kitchen atorvastatin (LIPITOR) 10 MG tablet Take 10 mg by mouth daily.     . fluticasone (FLONASE) 50 MCG/ACT nasal spray Place 1 spray into both nostrils 2 (two) times daily.    Marland Kitchen gabapentin (NEURONTIN) 300 MG capsule Take 300 mg by mouth 2 (two) times daily.     . hydrOXYzine  (ATARAX/VISTARIL) 25 MG tablet Take 1 tablet (25 mg total) by mouth every 8 (eight) hours as needed for anxiety. 90 tablet 2  . Lancets (ONETOUCH DELICA PLUS [id number]) MISC USE TO TEST BLOOD SUGAR THREE TIMES DAILY    . lisinopril-hydrochlorothiazide (PRINZIDE,ZESTORETIC) 20-12.5 MG tablet Take 1 tablet by mouth 2 (two) times daily.     Marland Kitchen omeprazole (PRILOSEC) 20 MG capsule Take 20 mg by mouth daily as needed (heartburn).     Glory Rosebush VERIO test strip USE TO TEST BLOOD SUGAR THREE TIMES DAILY    . oxyCODONE-acetaminophen (PERCOCET) 5-325 MG tablet Take 1 tablet by mouth every 4 (four) hours as needed for severe pain. 20 tablet 0  . rOPINIRole (REQUIP) 1 MG tablet Take 1 tablet (1 mg total) by  mouth at bedtime. Take one hour before going to bed. 30 tablet 1  . sertraline (ZOLOFT) 100 MG tablet Take 1.5 tablets (150 mg total) by mouth daily. 135 tablet 1  . SYNJARDY 12.03-999 MG TABS     . TOUJEO MAX SOLOSTAR 300 UNIT/ML SOPN Inject 70 Units into the skin 2 (two) times daily.   12   No current facility-administered medications for this visit.      Psychiatric Specialty Exam: Review of Systems  Neurological:       Leg twitching.  Psychiatric/Behavioral: The patient is nervous/anxious.     There were no vitals taken for this visit.There is no height or weight on file to calculate BMI.  General Appearance: NA  Eye Contact:  NA  Speech:  Clear and Coherent and Normal Rate  Volume:  Normal  Mood:  Somne rsidual anxiety, depresion.  Affect:  NA  Thought Process:  Goal Directed and Linear  Orientation:  Full (Time, Place, and Person)  Thought Content: Logical   Suicidal Thoughts:  No  Homicidal Thoughts:  No  Memory:  Immediate;   Good Recent;   Good Remote;   Good  Judgement:  Good  Insight:  Good  Psychomotor Activity:  NA  Concentration:  Concentration: Good  Recall:  Good  Fund of Knowledge: Good  Language: Good  Akathisia:  Negative  Handed:  Right  AIMS (if indicated):  not done  Assets:  Communication Skills Desire for Improvement Financial Resources/Insurance Housing Resilience Talents/Skills  ADL's:  Intact  Cognition: WNL  Sleep:  Fair    Assessment and Plan: 59yo male with hx of depression and anxiety which worsened to a point of having SI in November last year.He appears to have a seasonal pattern of depression.He has hadfurtherimprovementin mood afterZoloftwas increased to 150 mg. He reports having muscle twitches at night which interfere with his sleep. He has been having those for a long time but did not mention them to me before.Heno longertakes 25 mg ofhydroxyzinefor anxiety.He lives alone, limited if any social support. He denies feeling suicidaland feels that his mood has sufficiently improved so no further med adjustments are necessary at this time.He did not like zolpidem and uses melatonin for occasional problems with sleep. We have tried Mirapex 0.25 mg for RLS but he did not see much benefit and stopped it. He was started on Requip 0.25 mg by Dr. Lolly Mustache two weeks ago - no clear benefit. He also takes hydroxyzine as needed for anxiety. He resumed taking gabapentin for peripheral neuropathy and hopes it may help with muscle twitching as well.   Dx: MDD in remission (seasonal pattern); RLS  Plan: Continue sertraline 150 mg daily.We will increase Requip to 0.5 mg x 1 week then to 1 mg one hour before bedtime for RLS. Next medication management appointment in3 months.The plan was discussed with patient who had an opportunity to ask questions and these were all answered. I spend20 minutes inphone consultation with the patient.   Magdalene Patricia, MD 04/29/2020, 3:41 PM

## 2020-04-29 NOTE — Progress Notes (Signed)
  Subjective:  Patient ID: Charles Thompson, male    DOB: 05/31/1961,  MRN: [id number]  Chief Complaint  Patient presents with  . Wound Check    R plantar forefoot, submet 1. Pt stated, "Seems a little better. Mild pain - sensation is returning. No pus/foul odors/fever/chills/N&V/swelling/abnormal glucose. Wore the wound vac until Monday".    59 y.o. male presents for wound care. Hx confirmed with patient. Wound continues to improve. Objective:  Physical Exam: Wound Location: right 1st metatarsal Wound Measurement: 1x1.5 Wound Base: Granular/Healthy Peri-wound: Calloused Exudate: Moderate amount Serosanguinous exudate wound without warmth, erythema, signs of acute infection. No probe to bone. Equinus contracture right ankle  L foot midfoot amputation noted  Assessment:   1. Diabetic ulcer of other part of right foot associated with diabetes mellitus due to underlying condition, with fat layer exposed (HCC)    Plan:  Patient was evaluated and treated and all questions answered.  Ulcer Right 1st Metatarsal -Continues to improve. His wound is almost completely flush to the skin. -Selectively debrided. Cauterized with silver nitrate -Plan for TAL, skin graft substitute.  Procedure: Selective Debridement of Wound Rationale: Removal of devitalized tissue from the wound to promote healing.  Pre-Debridement Wound Measurements: 1.5 cm x 1 cm x 0.2 cm  Post-Debridement Wound Measurements: same as pre-debridement. Type of Debridement: sharp selective Tissue Removed: Devitalized soft-tissue Dressing: Dry, sterile, compression dressing. Disposition: Patient tolerated procedure well. Patient to return in 1 week for follow-up.

## 2020-05-15 NOTE — Progress Notes (Signed)
  Subjective:  Patient ID: Jeanpierre Thebeau, male    DOB: Feb 21, 1961,  MRN: [id number]  Chief Complaint  Patient presents with  . Wound Check    Right foot wound check. Pt denies constitutional symptoms and pus drainage.   Worthy Keeler    Callous trim    59 y.o. male presents for wound care. Hx confirmed with patient. Wound continues to improve. Objective:  Physical Exam: Wound Location: right 1st metatarsal Wound Measurement: 1x5 x 1.5 post-debridement Wound Base: Granular/Healthy Peri-wound: Calloused Exudate: Moderate amount Serosanguinous exudate wound without warmth, erythema, signs of acute infection. No probe to bone.  L foot midfoot amputation noted  Assessment:   No diagnosis found. Plan:  Patient was evaluated and treated and all questions answered.  Ulcer Right 1st Metatarsal -Continues to improve. Continue SNAP VAC therapy -Should wound be flush to skin would consider skin graft application vs skin graft substitute  Procedure: Selective Debridement of Wound Rationale: Removal of devitalized tissue from the wound to promote healing.  Pre-Debridement Wound Measurements: 1 cm x 1 cm x 0.3 cm  Post-Debridement Wound Measurements: same as pre-debridement. Type of Debridement: sharp selective Tissue Removed: Devitalized soft-tissue Dressing: Dry, sterile, compression dressing. Disposition: Patient tolerated procedure well. Patient to return in 1 week for follow-up.   Procedure: Mechanical Wound VAC Application Location: right 1st met Wound Measurement: 1 cm x 1 cm x 0.3 cm  Technique: Blue foam to wound base, followed by hydrocolloid 0-Ring, followed by hydrocolloid dressing. Plunger maximally depressed with with good seal noted. Disposition: Patient tolerated procedure well.   No follow-ups on file.

## 2020-05-15 NOTE — Progress Notes (Signed)
  Subjective:  Patient ID: Charles Thompson, male    DOB: 05-01-1961,  MRN: [id number]  Chief Complaint  Patient presents with  . Routine Post Op     POV #2 DOS 03/19/20 RT FOOT TENDOACHILLES LENGTHENING, RT FOOT WOUND DEBRIDE, APPLICATION OF SKIN GRAFT, pt states that he is doing alot better, but states that he is not completely healed yet, pt also states that he has been stretching it often.     DOS: 03/19/20 Procedure: Right TAL with wound debridement, application of Skin graft substitute  59 y.o. male presents with the above complaint. History confirmed with patient.   Objective:  Physical Exam: tenderness at the surgical site, local edema noted and calf supple, nontender. Incision: healing well Plantar right 1st wound with small granular wound measuring 0.3 in diameter with granular base.  Assessment:   1. Diabetic ulcer of other part of right foot associated with diabetes mellitus due to underlying condition, with fat layer exposed (HCC)     Plan:  Patient was evaluated and treated and all questions answered.  Post-operative State -Staples and graft removed. Sutures removed plantar leg. -Continue ROM exercise -WBAT in boot -Wound much improved. -Ciotinue prisma daily to wound  No follow-ups on file.

## 2020-05-15 NOTE — Progress Notes (Signed)
  Subjective:  Patient ID: Charles Thompson, male    DOB: [birth year],  MRN: [id number]  Chief Complaint  Patient presents with  . Wound Check    R plantar forefoot submet 1. Pt stated, "It's better. 2/10 pain. I haven't been wearing the boot (causes pain). I ran out of Prisma/ointments, so I haven't been putting anything on/in it". No fever/chills/N&V/foul odor/abnormal drainage/significantly abnormal glucose.    DOS: 03/19/20 Procedure: Right TAL with wound debridement, application of Skin graft substitute  59 y.o. male presents with the above complaint. History confirmed with patient.   Objective:  Physical Exam: tenderness at the surgical site, local edema noted and calf supple, nontender. Incision: healing well Plantar right 1st wound with superficial wound  Assessment:   1. Diabetic ulcer of other part of right foot associated with diabetes mellitus due to underlying condition, with fat layer exposed (HCC)     Plan:  Patient was evaluated and treated and all questions answered.  Post-operative State -Wound improving but with small open area. Dressed with silvadene and DSD. Continue ointment and band-aid daily. -Will need offloading insert with toe filler. -F/u in 3 weeks for recheck.  Return in about 3 weeks (around 05/09/2020) for Wound Care, Right.

## 2020-05-15 NOTE — Progress Notes (Signed)
  Subjective:  Patient ID: Hjalmar Ballengee, male    DOB: May 07, 1961,  MRN: [id number]  Chief Complaint  Patient presents with  . Routine Post Op    POV#1 DOS 5.4.2021 RT FOOT TENDOACHILLES LENGTHENING, RT FOOT WOUND DEBRIDE, APPLICATION OF SKIN GRAFT. Pt denies fever/chills/nausea/vomiting. No new concerns.  . Callouses    Left plantar heel painful callous, requests trim.    DOS: 03/19/20 Procedure: Right TAL with wound debridement, application of Skin graft substitute  59 y.o. male presents with the above complaint. History confirmed with patient.   Objective:  Physical Exam: tenderness at the surgical site, local edema noted and calf supple, nontender. Incision: healing well, no significant drainage, no dehiscence. Graft intact plantar 1st wound.  Assessment:   1. Diabetic ulcer of other part of right foot associated with diabetes mellitus due to underlying condition, with fat layer exposed (HCC)   2. Post-operative state     Plan:  Patient was evaluated and treated and all questions answered.  Post-operative State -Dressing applied consisting of hydrogel sterile gauze, kerlix and ACE bandage -WBAT in CAM boot  Return in about 1 week (around 04/04/2020) for Post-Op (No XRs).

## 2020-05-16 ENCOUNTER — Other Ambulatory Visit: Payer: Self-pay

## 2020-05-16 ENCOUNTER — Ambulatory Visit (INDEPENDENT_AMBULATORY_CARE_PROVIDER_SITE_OTHER): Payer: Medicare Other | Admitting: Podiatry

## 2020-05-16 VITALS — Temp 96.6°F

## 2020-05-16 DIAGNOSIS — L97512 Non-pressure chronic ulcer of other part of right foot with fat layer exposed: Secondary | ICD-10-CM | POA: Diagnosis not present

## 2020-05-16 DIAGNOSIS — E08621 Diabetes mellitus due to underlying condition with foot ulcer: Secondary | ICD-10-CM | POA: Diagnosis not present

## 2020-05-16 NOTE — Progress Notes (Signed)
Subjective:  Patient ID: Charles Thompson, male    DOB: Apr 22, 1961,  MRN: [id number]  Chief Complaint  Patient presents with   Wound Check    R plantar forefoot submet 1. No fever/chills/N&V/foul odor. Pt stated, "I've seen pus off and on'.    DOS: 03/19/20 Procedure: Right TAL with wound debridement, application of Skin graft substitute  59 y.o. male presents with the above complaint. History confirmed with patient.   Objective:  Physical Exam: good Ankle joint ROM. Calluses submet 1,3,5, right; heel left Incision: healing well Plantar right 1st wound 1x2 with granular base, undermining and HPK prior to debridement  Assessment:   1. Diabetic ulcer of other part of right foot associated with diabetes mellitus due to underlying condition, with fat layer exposed ([place])    Plan:  Patient was evaluated and treated and all questions answered.  Post-operative State -Wound measuring 1x2. Overall this is great improvement than prior to surgery. There is no pus or signs of infection. The wound will still need debridement until healed. I think further offloading with DM insoles will help further heal. Should it not he may require 1st met osteotomy.  -Dressed with prisma and DSD. -F/u in 1 month -Appt for DM shoes, toe filler.  Return in about 1 month (around 06/16/2020).

## 2020-06-07 ENCOUNTER — Ambulatory Visit (INDEPENDENT_AMBULATORY_CARE_PROVIDER_SITE_OTHER): Payer: Medicare Other | Admitting: Orthotics

## 2020-06-07 ENCOUNTER — Other Ambulatory Visit: Payer: Self-pay

## 2020-06-07 DIAGNOSIS — Z9889 Other specified postprocedural states: Secondary | ICD-10-CM

## 2020-06-07 DIAGNOSIS — Z89411 Acquired absence of right great toe: Secondary | ICD-10-CM

## 2020-06-07 DIAGNOSIS — E08621 Diabetes mellitus due to underlying condition with foot ulcer: Secondary | ICD-10-CM

## 2020-06-07 DIAGNOSIS — Z89432 Acquired absence of left foot: Secondary | ICD-10-CM

## 2020-06-07 DIAGNOSIS — L97512 Non-pressure chronic ulcer of other part of right foot with fat layer exposed: Secondary | ICD-10-CM

## 2020-06-07 DIAGNOSIS — M21961 Unspecified acquired deformity of right lower leg: Secondary | ICD-10-CM

## 2020-06-07 NOTE — Progress Notes (Signed)
Russ ordered NB [id number];  DOCS only to be entered, f/o to be ordered [facility]

## 2020-06-13 ENCOUNTER — Other Ambulatory Visit: Payer: Self-pay

## 2020-06-13 ENCOUNTER — Ambulatory Visit (INDEPENDENT_AMBULATORY_CARE_PROVIDER_SITE_OTHER): Payer: Medicare Other | Admitting: Podiatry

## 2020-06-13 DIAGNOSIS — E08621 Diabetes mellitus due to underlying condition with foot ulcer: Secondary | ICD-10-CM | POA: Diagnosis not present

## 2020-06-13 DIAGNOSIS — L97512 Non-pressure chronic ulcer of other part of right foot with fat layer exposed: Secondary | ICD-10-CM | POA: Diagnosis not present

## 2020-06-13 NOTE — Progress Notes (Signed)
  Subjective:  Patient ID: Charles Thompson, male    DOB: 22-Aug-1961,  MRN: [id number]  Chief Complaint  Patient presents with  . Wound Check    pt is here for wound care. Pt states that he has been keeping it well bandaged. Pt shows no overall signs of infection    DOS: 03/19/20 Procedure: Right TAL with wound debridement, application of Skin graft substitute  59 y.o. male presents with the above complaint. History confirmed with patient.   Objective:  Physical Exam: good Ankle joint ROM. Calluses submet 1,3,5, right; heel left Incision: healing well Plantar right 1st wound 2x2.5 post-debridement with granular base, undermining and HPK prior to debridement  Assessment:   1. Diabetic ulcer of other part of right foot associated with diabetes mellitus due to underlying condition, with fat layer exposed (HCC)    Plan:  Patient was evaluated and treated and all questions answered.  Post-operative State -Wound measuring more than previous.  -Dressed with prisma and DSD. -Awaiting diabetic shoes and inserts with filler.  I think this will help overall offload the wound.  Procedure: Excisional Debridement of Wound Indication: Removal of non-viable soft tissue from the wound to promote healing.  Anesthesia: none Pre-Debridement Wound Measurements: 0.5 cm x 1 cm x 0.3 cm  Post-Debridement Wound Measurements: 2 cm x 2.5 cm x 0.3 cm  Type of Debridement: Sharp Excisional Tissue Removed: Non-viable soft tissue Instrumentation: 15 blade and tissue nipper Depth of Debridement: subcutaneous tissue. Technique: Sharp excisional debridement to bleeding, viable wound base.  Dressing: Dry, sterile, compression dressing. Disposition: Patient tolerated procedure well. Patient to return in 1 week for follow-up.    No follow-ups on file.

## 2020-06-14 ENCOUNTER — Encounter: Payer: Medicare Other | Admitting: Podiatry

## 2020-06-25 ENCOUNTER — Other Ambulatory Visit (HOSPITAL_COMMUNITY): Payer: Self-pay | Admitting: Psychiatry

## 2020-06-26 ENCOUNTER — Telehealth: Payer: Self-pay | Admitting: Podiatry

## 2020-06-26 NOTE — Telephone Encounter (Signed)
Pt called and spoke to call center and asked me to call back about his diabetic shoes.  I returned call and we have not received the paperwork from pcp and that is what is holding up pt getting his shoes. He was upset and is calling the pcp office.

## 2020-07-16 ENCOUNTER — Ambulatory Visit: Payer: Medicare Other | Admitting: Podiatry

## 2020-07-16 ENCOUNTER — Other Ambulatory Visit: Payer: Self-pay

## 2020-07-16 ENCOUNTER — Ambulatory Visit (INDEPENDENT_AMBULATORY_CARE_PROVIDER_SITE_OTHER): Payer: Medicare Other

## 2020-07-16 ENCOUNTER — Other Ambulatory Visit: Payer: Self-pay | Admitting: Podiatry

## 2020-07-16 DIAGNOSIS — E08621 Diabetes mellitus due to underlying condition with foot ulcer: Secondary | ICD-10-CM

## 2020-07-16 DIAGNOSIS — L97512 Non-pressure chronic ulcer of other part of right foot with fat layer exposed: Secondary | ICD-10-CM

## 2020-07-16 DIAGNOSIS — L97511 Non-pressure chronic ulcer of other part of right foot limited to breakdown of skin: Secondary | ICD-10-CM | POA: Diagnosis not present

## 2020-07-16 DIAGNOSIS — Q6671 Congenital pes cavus, right foot: Secondary | ICD-10-CM

## 2020-07-16 DIAGNOSIS — M79671 Pain in right foot: Secondary | ICD-10-CM

## 2020-07-16 MED ORDER — DOXYCYCLINE HYCLATE 100 MG PO TABS: 100.0000 mg | ORAL_TABLET | Freq: Two times a day (BID) | ORAL | 0 refills | Status: DC

## 2020-07-18 NOTE — Progress Notes (Signed)
  Subjective:  Patient ID: Charles Thompson, male    DOB: 04-01-1961,  MRN: [id number]  Chief Complaint  Patient presents with  . Wound Check    Denies fever/nausea/vomiting/chills.    DOS: 03/19/20 Procedure: Right TAL with wound debridement, application of Skin graft substitute  59 y.o. male presents with the above complaint. History confirmed with patient.   Objective:  Physical Exam: good Ankle joint ROM. Calluses submet 1,3,5, right; heel left Incision: healing well Plantar right 1st wound 2x2.5 post-debridement with granular base, undermining and HPK prior to debridement. Right 5th toe PIPJ wound without probe to bone. Edema to digit. Good capillary refill.  Assessment:   1. Skin ulcer of toe of right foot, limited to breakdown of skin (HCC)   2. Diabetic ulcer of other part of right foot associated with diabetes mellitus due to underlying condition, with fat layer exposed (HCC)   3. Cavus deformity of right foot    Plan:  Patient was evaluated and treated and all questions answered.  Right 1st metatarsal wound -Would consider surgical intervention for cavus foot for recalcitrant ulcer. Could consider medial plantar fasciotomy at midsubstance, possible peroneal stop as non-osseous procedures to help offload the 1st metatarsal. -Wound measuring about the same as previous.  -Dressed with prisma and DSD. -Awaiting diabetic shoes and inserts with filler.  I think this will help overall offload the wound.  New 5th toe infection -XR reviewed with patient. No signs of underlying osseous erosion. -Rx doxycycline for ST infection -F/u in 2 weeks  Return in about 2 weeks (around 07/30/2020) for f/u right 5th toe infection.

## 2020-07-25 ENCOUNTER — Ambulatory Visit: Payer: Medicare Other | Admitting: Orthotics

## 2020-07-28 ENCOUNTER — Other Ambulatory Visit (HOSPITAL_COMMUNITY): Payer: Self-pay | Admitting: Psychiatry

## 2020-07-30 ENCOUNTER — Ambulatory Visit (INDEPENDENT_AMBULATORY_CARE_PROVIDER_SITE_OTHER): Payer: Medicare Other | Admitting: Podiatry

## 2020-07-30 ENCOUNTER — Other Ambulatory Visit: Payer: Self-pay

## 2020-07-30 ENCOUNTER — Ambulatory Visit (INDEPENDENT_AMBULATORY_CARE_PROVIDER_SITE_OTHER): Payer: Medicare Other | Admitting: Orthotics

## 2020-07-30 DIAGNOSIS — L97513 Non-pressure chronic ulcer of other part of right foot with necrosis of muscle: Secondary | ICD-10-CM

## 2020-07-30 DIAGNOSIS — Z89411 Acquired absence of right great toe: Secondary | ICD-10-CM | POA: Diagnosis not present

## 2020-07-30 DIAGNOSIS — L97421 Non-pressure chronic ulcer of left heel and midfoot limited to breakdown of skin: Secondary | ICD-10-CM

## 2020-07-30 DIAGNOSIS — L97511 Non-pressure chronic ulcer of other part of right foot limited to breakdown of skin: Secondary | ICD-10-CM

## 2020-07-30 DIAGNOSIS — E1140 Type 2 diabetes mellitus with diabetic neuropathy, unspecified: Secondary | ICD-10-CM | POA: Diagnosis not present

## 2020-07-30 DIAGNOSIS — Q6671 Congenital pes cavus, right foot: Secondary | ICD-10-CM

## 2020-07-30 DIAGNOSIS — Z89432 Acquired absence of left foot: Secondary | ICD-10-CM

## 2020-07-30 NOTE — Progress Notes (Signed)
  Subjective:  Patient ID: Charles Thompson, male    DOB: 07/25/1961,  MRN: [id number]  No chief complaint on file.  59 y.o. male presents for wound care. Hx confirmed with patient. States that both areas are healing, reports pain at 5th toe area, 6/10 spasms in the foot. Denies redness and swelling. Reports less drainage of the 1st met area. Has been applying neosporin and dressing. Objective:  Physical Exam: Wound Location: right submet 1, left foot distal Wound Measurement: 2x1.5 post-debridement, 1x0.3 post-debridement Wound Base: Mixed Granular/Fibrotic Peri-wound: Calloused Exudate: Scant/small amount Serosanguinous exudate wound without warmth, erythema, signs of acute infection Wound R 1st Met almost probes to capsule.  5th toe right appears epithelialized.  HPK submet 3,5 right, bilat heels Assessment:   1. Skin ulcer of toe of right foot with necrosis of muscle ([place])   2. Midfoot skin ulcer, left, limited to breakdown of skin [facility])    Plan:  Patient was evaluated and treated and all questions answered.  Right 1st metatarsal wound -Excisional debridement as below -Would consider surgical intervention for cavus foot for recalcitrant ulcer. Could consider medial plantar fasciotomy at midsubstance, possible peroneal stop as non-osseous procedures to help offload the 1st metatarsal. -Wound measuring about the same as previous.  -Dressed with silvadene and DSD. -Awaiting diabetic shoes and inserts with filler.  I think this will help overall offload the wound.  New 5th toe infection -Improved  Left foot ulceration -Debrided as below -Dressed with silvadene and DSD  Procedure: Excisional Debridement of Wound Indication: Removal of non-viable soft tissue from the wound to promote healing.  Anesthesia: none Pre-Debridement Wound Measurements: 0.5 cm x 0.5 cm x 0.4 cm, overlying HPK Post-Debridement Wound Measurements: 1.5 cm x 2 cm x 0.5 cm, 0.5x1 Type of Debridement:  Sharp Excisional Tissue Removed: Non-viable soft tissue Instrumentation: 15 blade and tissue nipper Depth of Debridement: muscle right, subcutaneous tissue left. Technique: Sharp excisional debridement to bleeding, viable wound base.  Dressing: Dry, sterile, compression dressing. Disposition: Patient tolerated procedure well. Patient to return in 1 week for follow-up.  No follow-ups on file.

## 2020-07-31 ENCOUNTER — Telehealth: Payer: Self-pay | Admitting: Podiatry

## 2020-07-31 ENCOUNTER — Telehealth (INDEPENDENT_AMBULATORY_CARE_PROVIDER_SITE_OTHER): Payer: Medicare Other | Admitting: Psychiatry

## 2020-07-31 DIAGNOSIS — F3340 Major depressive disorder, recurrent, in remission, unspecified: Secondary | ICD-10-CM | POA: Diagnosis not present

## 2020-07-31 DIAGNOSIS — G2581 Restless legs syndrome: Secondary | ICD-10-CM | POA: Diagnosis not present

## 2020-07-31 DIAGNOSIS — G4701 Insomnia due to medical condition: Secondary | ICD-10-CM | POA: Diagnosis not present

## 2020-07-31 MED ORDER — CLONAZEPAM 1 MG PO TABS: 1.0000 mg | ORAL_TABLET | Freq: Every day | ORAL | 0 refills | Status: DC

## 2020-07-31 NOTE — Telephone Encounter (Signed)
Pt stopped in and said to tell you to order an 11 4e in the shoes for him. He did have a problem with the right insert not filling up the toe but I told him he would have to see you for that. I told him I would call when shoes come in and we could schedule an appt to pick up and you can check the insert at that time. He stated he played golf today and the inserts were good. His feet did not hurt at all and they have hurt for over 10 years.

## 2020-07-31 NOTE — Progress Notes (Signed)
BH MD/PA/NP OP Progress Note  07/31/2020 3:46 PM Tri Chittick  MRN:  [id number] Interview was conducted by phone and I verified that I was speaking with the correct person using two identifiers. I discussed the limitations of evaluation and management by telemedicine and  the availability of in person appointments. Patient expressed understanding and agreed to proceed. Patient location - home; physician - home office.  Chief Complaint: "I still have these jerking leg s that interfere with sleep".  HPI: 59yo male with hx of depression and anxiety which worsened to a point of having SI in November last year.He appears to have a seasonal pattern of depression.He has hadfurtherimprovementin mood afterZoloftwas increased to 150 mg. He reports having muscle twitches at night which interfere with his sleep. He has been having those for a long time. Heno longertakes 25 mg ofhydroxyzinefor anxiety.He lives alone, limited if any social support. He denies feeling suicidaland feels that his mood has sufficiently improved so no further med adjustments are necessary at this time.He did not like zolpidem and uses melatonin for insomnia with some benefit. We have tried Mirapex 0.25 mg for RLS but he did not see much benefit and stopped it.He was started on Requip by Dr. Lolly Mustache dose up to 1 mg did not help at all and neither did gabapentin 300 mg (taken bid for diabetic neuropathy).    Visit Diagnosis:    ICD-10-CM   1. Major depressive disorder, recurrent episode, in remission with seasonal pattern (HCC)  F33.40   2. Insomnia secondary to restless leg syndrome  G25.81    G47.01     Past Psychiatric History: Please see intake H&P.  Past Medical History:  Past Medical History:  Diagnosis Date  . Anxiety   . Arthritis    neck  . Chronic ulcer of right foot (HCC)   . Diabetic peripheral neuropathy (HCC)   . First degree heart block   . GERD (gastroesophageal reflux disease)   .  History of osteomyelitis    bilateral feet  . Hyperlipidemia   . Hypertension    followed by pcp  (03-07-2020 per pt never had a stress test)  . MDD (major depressive disorder)   . Type 2 diabetes mellitus with insulin therapy (HCC)    followed by pcp   (03-07-2020  pt stated checks blood sugar twice dialy,  fasting sugar--- 100-160)    Past Surgical History:  Procedure Laterality Date  . ALLOGRAFT APPLICATION Right 03/19/2020   Procedure: APPLICATION OF INTEGRA;  Surgeon: Park Liter, DPM;  Location: [facility];  Service: Podiatry;  Laterality: Right;  . AMPUTATION Left 06/22/2018   Procedure: LEFT FOOT FIRST RAY AMPUTATION;  Surgeon: Nadara Mustard, MD;  Location: [facility] OR;  Service: Orthopedics;  Laterality: Left;  . AMPUTATION Left 09/09/2018   Procedure: Left Transmetatarsal Amputation;  Surgeon: Nadara Mustard, MD;  Location: [facility] OR;  Service: Orthopedics;  Laterality: Left;  . EYE SURGERY    . TENDON LENGTHENING Right 03/19/2020   Procedure: TENDOACHILLES LENGTHENING;  Surgeon: Park Liter, DPM;  Location: [facility];  Service: Podiatry;  Laterality: Right;  . TOE AMPUTATION Right 12-11-2015  @[facility]    great toe  . WOUND DEBRIDEMENT Right 03/19/2020   Procedure: DEBRIDEMENT WOUND;  Surgeon: 05/19/2020, DPM;  Location:  SURGERY CENTER;  Service: Podiatry;  Laterality: Right;    Family Psychiatric History: None.  Family History: No family history on file.  Social History:  Social History  Socioeconomic History  . Marital status: Single    Spouse name: Not on file  . Number of children: Not on file  . Years of education: Not on file  . Highest education level: Not on file  Occupational History  . Not on file  Tobacco Use  . Smoking status: Current Every Day Smoker    Packs/day: 0.75    Types: Cigarettes  . Smokeless tobacco: Never Used  Vaping Use  . Vaping Use: Never used  Substance and Sexual Activity  . Alcohol use:  Yes    Alcohol/week: 0.0 standard drinks    Comment: occ  . Drug use: Not Currently    Types: Marijuana    Comment: "not lately"  . Sexual activity: Not Currently  Other Topics Concern  . Not on file  Social History Narrative  . Not on file   Social Determinants of Health   Financial Resource Strain:   . Difficulty of Paying Living Expenses: Not on file  Food Insecurity:   . Worried About Programme researcher, broadcasting/film/video in the Last Year: Not on file  . Ran Out of Food in the Last Year: Not on file  Transportation Needs:   . Lack of Transportation (Medical): Not on file  . Lack of Transportation (Non-Medical): Not on file  Physical Activity:   . Days of Exercise per Week: Not on file  . Minutes of Exercise per Session: Not on file  Stress:   . Feeling of Stress : Not on file  Social Connections:   . Frequency of Communication with Friends and Family: Not on file  . Frequency of Social Gatherings with Friends and Family: Not on file  . Attends Religious Services: Not on file  . Active Member of Clubs or Organizations: Not on file  . Attends Banker Meetings: Not on file  . Marital Status: Not on file    Allergies: No Known Allergies  Metabolic Disorder Labs: Lab Results  Component Value Date   HGBA1C 6.6 (H) 09/09/2018   MPG 142.72 09/09/2018   MPG 142.72 06/20/2018   No results found for: PROLACTIN No results found for: CHOL, TRIG, HDL, CHOLHDL, VLDL, LDLCALC No results found for: TSH  Therapeutic Level Labs: No results found for: LITHIUM No results found for: VALPROATE No components found for:  CBMZ  Current Medications: Current Outpatient Medications  Medication Sig Dispense Refill  . amLODipine (NORVASC) 10 MG tablet Take 10 mg by mouth daily.     Marland Kitchen aspirin 81 MG tablet Take 81 mg by mouth daily.    Marland Kitchen atorvastatin (LIPITOR) 10 MG tablet Take 10 mg by mouth daily.     . clonazePAM (KLONOPIN) 1 MG tablet Take 1 tablet (1 mg total) by mouth at bedtime. 90  tablet 0  . doxycycline (VIBRA-TABS) 100 MG tablet Take 1 tablet (100 mg total) by mouth 2 (two) times daily. 20 tablet 0  . fluticasone (FLONASE) 50 MCG/ACT nasal spray Place 1 spray into both nostrils 2 (two) times daily.    Marland Kitchen gabapentin (NEURONTIN) 300 MG capsule Take 300 mg by mouth 2 (two) times daily.     . Lancets (ONETOUCH DELICA PLUS LANCET33G) MISC USE TO TEST BLOOD SUGAR THREE TIMES DAILY    . lisinopril-hydrochlorothiazide (PRINZIDE,ZESTORETIC) 20-12.5 MG tablet Take 1 tablet by mouth 2 (two) times daily.     Marland Kitchen omeprazole (PRILOSEC) 20 MG capsule Take 20 mg by mouth daily as needed (heartburn).     Letta Pate VERIO test strip  USE TO TEST BLOOD SUGAR THREE TIMES DAILY    . oxyCODONE-acetaminophen (PERCOCET) 5-325 MG tablet Take 1 tablet by mouth every 4 (four) hours as needed for severe pain. 20 tablet 0  . sertraline (ZOLOFT) 100 MG tablet TAKE 1 AND 1/2 TABLETS(150 MG) BY MOUTH DAILY 135 tablet 1  . SYNJARDY 12.03-999 MG TABS     . TOUJEO MAX SOLOSTAR 300 UNIT/ML SOPN Inject 70 Units into the skin 2 (two) times daily.   12   No current facility-administered medications for this visit.      Psychiatric Specialty Exam: Review of Systems  Psychiatric/Behavioral: Positive for sleep disturbance.  All other systems reviewed and are negative.   There were no vitals taken for this visit.There is no height or weight on file to calculate BMI.  General Appearance: NA  Eye Contact:  NA  Speech:  Clear and Coherent and Normal Rate  Volume:  Normal  Mood:  Euthymic  Affect:  NA  Thought Process:  Goal Directed  Orientation:  Full (Time, Place, and Person)  Thought Content: Logical   Suicidal Thoughts:  No  Homicidal Thoughts:  No  Memory:  Immediate;   Good Recent;   Good  Judgement:  Good  Insight:  Good  Psychomotor Activity:  NA  Concentration:  Concentration: Good  Recall:  Good  Fund of Knowledge: Good  Language: Good  Akathisia:  Negative  Handed:  Right  AIMS (if  indicated): not done  Assets:  Communication Skills Desire for Improvement Financial Resources/Insurance Housing Resilience  ADL's:  Intact  Cognition: WNL  Sleep:  Fair   Assessment and Plan: 59yo male with hx of depression and anxiety which worsened to a point of having SI in November last year.He appears to have a seasonal pattern of depression.He has hadfurtherimprovementin mood afterZoloftwas increased to 150 mg. He reports having muscle twitches at night which interfere with his sleep. He has been having those for a long time. Heno longertakes 25 mg ofhydroxyzinefor anxiety.He lives alone, limited if any social support. He denies feeling suicidaland feels that his mood has sufficiently improved so no further med adjustments are necessary at this time.He did not like zolpidem and uses melatonin for insomnia with some benefit. We have tried Mirapex 0.25 mg for RLS but he did not see much benefit and stopped it.He was started on Requip by Dr. Lolly Mustache dose up to 1 mg did not help at all and neither did gabapentin 300 mg (taken bid for diabetic neuropathy).    Dx: MDD in remission (seasonal pattern); RLS  Plan: Continue sertraline 150 mg daily.We will discontinue Requip and try clonazepam 1 mg at HS for RLS/insomnia. Next medication management appointment in3 months.The plan was discussed with patient who had an opportunity to ask questions and these were all answered. I spend30minutes inphone consultation with the patient.    Magdalene Patricia, MD 07/31/2020, 3:46 PM

## 2020-07-31 NOTE — Progress Notes (Signed)
Need to reorder shoes to larger size due to the toefiller (met) Patient will call and advise if f/o will fit into 11W shoe he has at home.  He took delvery.

## 2020-08-13 ENCOUNTER — Ambulatory Visit: Payer: Medicare Other | Admitting: Orthotics

## 2020-08-13 ENCOUNTER — Other Ambulatory Visit: Payer: Self-pay

## 2020-08-13 DIAGNOSIS — Z89432 Acquired absence of left foot: Secondary | ICD-10-CM

## 2020-08-13 DIAGNOSIS — L97513 Non-pressure chronic ulcer of other part of right foot with necrosis of muscle: Secondary | ICD-10-CM

## 2020-08-13 DIAGNOSIS — L97421 Non-pressure chronic ulcer of left heel and midfoot limited to breakdown of skin: Secondary | ICD-10-CM

## 2020-08-13 NOTE — Progress Notes (Signed)
Confirm [id number]

## 2020-08-19 ENCOUNTER — Other Ambulatory Visit: Payer: Self-pay

## 2020-08-19 ENCOUNTER — Ambulatory Visit: Payer: Medicare Other | Admitting: Orthotics

## 2020-08-19 DIAGNOSIS — Z89432 Acquired absence of left foot: Secondary | ICD-10-CM

## 2020-08-19 DIAGNOSIS — L97513 Non-pressure chronic ulcer of other part of right foot with necrosis of muscle: Secondary | ICD-10-CM

## 2020-08-19 DIAGNOSIS — L97421 Non-pressure chronic ulcer of left heel and midfoot limited to breakdown of skin: Secondary | ICD-10-CM

## 2020-08-19 NOTE — Progress Notes (Signed)
Couldn't find shoes; need to get Dawn to help me locate them.

## 2020-08-27 ENCOUNTER — Ambulatory Visit: Payer: Medicare Other | Admitting: Podiatry

## 2020-08-27 ENCOUNTER — Other Ambulatory Visit: Payer: Self-pay

## 2020-08-27 DIAGNOSIS — Z89432 Acquired absence of left foot: Secondary | ICD-10-CM

## 2020-08-27 DIAGNOSIS — L97513 Non-pressure chronic ulcer of other part of right foot with necrosis of muscle: Secondary | ICD-10-CM

## 2020-09-15 NOTE — Progress Notes (Signed)
  Subjective:  Patient ID: Charles Thompson, male    DOB: 06-08-1961,  MRN: [id number]  Chief Complaint  Patient presents with  . Wound Check    Pt stated that he has no concerns and pain comes and goes depending on how long he is on feet    59 y.o. male presents for wound care. Hx confirmed with patient.  Ready for diabetic shoes.  States the pain comes and goes depends upon how much she is on it.  Denies nausea vomiting fever chills Objective:  Physical Exam: Wound Location: right submet 1, left foot distal Wound Measurement: 2x1 post-debridement, 0.5x0.3 post-debridement Wound Base: Mixed Granular/Fibrotic Peri-wound: Calloused Exudate: Scant/small amount Serosanguinous exudate wound without warmth, erythema, signs of acute infection Wound R 1st Met almost probes to capsule.  5th toe right appears epithelialized.  HPK submet 3,5 right, bilat heels Assessment:   1. Skin ulcer of toe of right foot with necrosis of muscle ([place])   2. History of transmetatarsal amputation of left foot ([place])    Plan:  Patient was evaluated and treated and all questions answered.  Wounds bilateral feet -Debrided as below -Dressed with Silvadene bilateral Band-Aid to the right foot Mepilex border dressing applied to the left -Dispensed DM shoes  Procedure: Selective Debridement of Wound Rationale: Removal of devitalized tissue from the wound to promote healing.  Pre-Debridement Wound Measurements: 2 x 1 x 0.2, 0.5 x 0.3 x 0.1 Post-Debridement Wound Measurements: same as pre-debridement. Type of Debridement: sharp selective Tissue Removed: Devitalized soft-tissue Dressing: Dry, sterile, compression dressing. Disposition: Patient tolerated procedure well. Patient to return in 1 week for follow-up.    No follow-ups on file.

## 2020-09-16 DIAGNOSIS — Z872 Personal history of diseases of the skin and subcutaneous tissue: Secondary | ICD-10-CM

## 2020-09-16 HISTORY — DX: Personal history of diseases of the skin and subcutaneous tissue: Z87.2

## 2020-10-01 ENCOUNTER — Ambulatory Visit: Payer: Medicare Other | Admitting: Podiatry

## 2020-10-04 ENCOUNTER — Ambulatory Visit: Payer: Medicare Other | Admitting: Podiatry

## 2020-10-04 ENCOUNTER — Ambulatory Visit (INDEPENDENT_AMBULATORY_CARE_PROVIDER_SITE_OTHER): Payer: Medicare Other

## 2020-10-04 ENCOUNTER — Other Ambulatory Visit: Payer: Self-pay

## 2020-10-04 DIAGNOSIS — M79672 Pain in left foot: Secondary | ICD-10-CM

## 2020-10-04 DIAGNOSIS — E11621 Type 2 diabetes mellitus with foot ulcer: Secondary | ICD-10-CM | POA: Diagnosis not present

## 2020-10-04 DIAGNOSIS — L97528 Non-pressure chronic ulcer of other part of left foot with other specified severity: Secondary | ICD-10-CM

## 2020-10-04 DIAGNOSIS — L97521 Non-pressure chronic ulcer of other part of left foot limited to breakdown of skin: Secondary | ICD-10-CM | POA: Diagnosis not present

## 2020-10-07 ENCOUNTER — Telehealth: Payer: Self-pay

## 2020-10-07 NOTE — Telephone Encounter (Signed)
Pt called stating that he is in tons of pain. Please advise.

## 2020-10-08 ENCOUNTER — Other Ambulatory Visit: Payer: Self-pay | Admitting: Podiatry

## 2020-10-09 ENCOUNTER — Other Ambulatory Visit: Payer: Self-pay | Admitting: Podiatry

## 2020-10-09 DIAGNOSIS — E11621 Type 2 diabetes mellitus with foot ulcer: Secondary | ICD-10-CM

## 2020-10-09 DIAGNOSIS — L97528 Non-pressure chronic ulcer of other part of left foot with other specified severity: Secondary | ICD-10-CM

## 2020-10-10 ENCOUNTER — Emergency Department (HOSPITAL_COMMUNITY): Payer: Medicare Other

## 2020-10-10 ENCOUNTER — Inpatient Hospital Stay (HOSPITAL_COMMUNITY): Payer: Medicare Other | Admitting: Certified Registered Nurse Anesthetist

## 2020-10-10 ENCOUNTER — Encounter (HOSPITAL_COMMUNITY)
Admission: EM | Disposition: A | Discharge status: Home Health | Payer: Self-pay | Source: Home / Self Care | Attending: Student

## 2020-10-10 ENCOUNTER — Encounter (HOSPITAL_COMMUNITY): Payer: Self-pay

## 2020-10-10 ENCOUNTER — Inpatient Hospital Stay (HOSPITAL_COMMUNITY)
Admission: EM | Admit: 2020-10-10 | Discharge: 2020-10-14 | DRG: 463 | Disposition: A | Discharge status: Home Health | Payer: Medicare Other | Attending: Student | Admitting: Student

## 2020-10-10 DIAGNOSIS — E669 Obesity, unspecified: Secondary | ICD-10-CM | POA: Diagnosis present

## 2020-10-10 DIAGNOSIS — K219 Gastro-esophageal reflux disease without esophagitis: Secondary | ICD-10-CM | POA: Diagnosis present

## 2020-10-10 DIAGNOSIS — E1152 Type 2 diabetes mellitus with diabetic peripheral angiopathy with gangrene: Secondary | ICD-10-CM | POA: Diagnosis present

## 2020-10-10 DIAGNOSIS — L02612 Cutaneous abscess of left foot: Secondary | ICD-10-CM | POA: Diagnosis present

## 2020-10-10 DIAGNOSIS — I440 Atrioventricular block, first degree: Secondary | ICD-10-CM | POA: Diagnosis present

## 2020-10-10 DIAGNOSIS — L03116 Cellulitis of left lower limb: Secondary | ICD-10-CM | POA: Diagnosis not present

## 2020-10-10 DIAGNOSIS — R6520 Severe sepsis without septic shock: Secondary | ICD-10-CM | POA: Diagnosis not present

## 2020-10-10 DIAGNOSIS — Z720 Tobacco use: Secondary | ICD-10-CM | POA: Diagnosis not present

## 2020-10-10 DIAGNOSIS — Z20822 Contact with and (suspected) exposure to covid-19: Secondary | ICD-10-CM | POA: Diagnosis present

## 2020-10-10 DIAGNOSIS — E8809 Other disorders of plasma-protein metabolism, not elsewhere classified: Secondary | ICD-10-CM | POA: Diagnosis not present

## 2020-10-10 DIAGNOSIS — F419 Anxiety disorder, unspecified: Secondary | ICD-10-CM | POA: Diagnosis present

## 2020-10-10 DIAGNOSIS — A409 Streptococcal sepsis, unspecified: Secondary | ICD-10-CM | POA: Diagnosis present

## 2020-10-10 DIAGNOSIS — R7881 Bacteremia: Secondary | ICD-10-CM

## 2020-10-10 DIAGNOSIS — E1142 Type 2 diabetes mellitus with diabetic polyneuropathy: Secondary | ICD-10-CM | POA: Diagnosis present

## 2020-10-10 DIAGNOSIS — Z79899 Other long term (current) drug therapy: Secondary | ICD-10-CM

## 2020-10-10 DIAGNOSIS — G4700 Insomnia, unspecified: Secondary | ICD-10-CM | POA: Diagnosis present

## 2020-10-10 DIAGNOSIS — N179 Acute kidney failure, unspecified: Secondary | ICD-10-CM | POA: Diagnosis present

## 2020-10-10 DIAGNOSIS — E785 Hyperlipidemia, unspecified: Secondary | ICD-10-CM | POA: Diagnosis present

## 2020-10-10 DIAGNOSIS — A419 Sepsis, unspecified organism: Secondary | ICD-10-CM | POA: Diagnosis not present

## 2020-10-10 DIAGNOSIS — B955 Unspecified streptococcus as the cause of diseases classified elsewhere: Secondary | ICD-10-CM

## 2020-10-10 DIAGNOSIS — E11628 Type 2 diabetes mellitus with other skin complications: Secondary | ICD-10-CM | POA: Diagnosis present

## 2020-10-10 DIAGNOSIS — Z6831 Body mass index (BMI) 31.0-31.9, adult: Secondary | ICD-10-CM

## 2020-10-10 DIAGNOSIS — F17210 Nicotine dependence, cigarettes, uncomplicated: Secondary | ICD-10-CM | POA: Diagnosis present

## 2020-10-10 DIAGNOSIS — L0390 Cellulitis, unspecified: Secondary | ICD-10-CM

## 2020-10-10 DIAGNOSIS — M47812 Spondylosis without myelopathy or radiculopathy, cervical region: Secondary | ICD-10-CM | POA: Diagnosis present

## 2020-10-10 DIAGNOSIS — F17200 Nicotine dependence, unspecified, uncomplicated: Secondary | ICD-10-CM | POA: Diagnosis not present

## 2020-10-10 DIAGNOSIS — G629 Polyneuropathy, unspecified: Secondary | ICD-10-CM | POA: Diagnosis not present

## 2020-10-10 DIAGNOSIS — E872 Acidosis: Secondary | ICD-10-CM | POA: Diagnosis not present

## 2020-10-10 DIAGNOSIS — E1140 Type 2 diabetes mellitus with diabetic neuropathy, unspecified: Secondary | ICD-10-CM

## 2020-10-10 DIAGNOSIS — I10 Essential (primary) hypertension: Secondary | ICD-10-CM | POA: Diagnosis present

## 2020-10-10 DIAGNOSIS — Z794 Long term (current) use of insulin: Secondary | ICD-10-CM | POA: Diagnosis not present

## 2020-10-10 DIAGNOSIS — E11621 Type 2 diabetes mellitus with foot ulcer: Secondary | ICD-10-CM | POA: Diagnosis not present

## 2020-10-10 DIAGNOSIS — L97422 Non-pressure chronic ulcer of left heel and midfoot with fat layer exposed: Secondary | ICD-10-CM | POA: Diagnosis present

## 2020-10-10 DIAGNOSIS — L97529 Non-pressure chronic ulcer of other part of left foot with unspecified severity: Secondary | ICD-10-CM | POA: Diagnosis present

## 2020-10-10 DIAGNOSIS — T8754 Necrosis of amputation stump, left lower extremity: Principal | ICD-10-CM | POA: Diagnosis present

## 2020-10-10 DIAGNOSIS — R7989 Other specified abnormal findings of blood chemistry: Secondary | ICD-10-CM | POA: Diagnosis not present

## 2020-10-10 DIAGNOSIS — E871 Hypo-osmolality and hyponatremia: Secondary | ICD-10-CM | POA: Diagnosis not present

## 2020-10-10 HISTORY — PX: I & D EXTREMITY: SHX5045

## 2020-10-10 LAB — RESP PANEL BY RT-PCR (FLU A&B, COVID) ARPGX2
Influenza A by PCR: NEGATIVE
Influenza B by PCR: NEGATIVE
SARS Coronavirus 2 by RT PCR: NEGATIVE

## 2020-10-10 LAB — GLUCOSE, CAPILLARY
Glucose-Capillary: 107 mg/dL — ABNORMAL HIGH (ref 70–99)
Glucose-Capillary: 118 mg/dL — ABNORMAL HIGH (ref 70–99)
Glucose-Capillary: 121 mg/dL — ABNORMAL HIGH (ref 70–99)
Glucose-Capillary: 122 mg/dL — ABNORMAL HIGH (ref 70–99)
Glucose-Capillary: 98 mg/dL (ref 70–99)

## 2020-10-10 LAB — HEMOGLOBIN A1C
Hgb A1c MFr Bld: 5.9 % — ABNORMAL HIGH (ref 4.8–5.6)
Mean Plasma Glucose: 122.63 mg/dL

## 2020-10-10 LAB — COMPREHENSIVE METABOLIC PANEL
ALT: 23 U/L (ref 0–44)
AST: 17 U/L (ref 15–41)
Albumin: 3.3 g/dL — ABNORMAL LOW (ref 3.5–5.0)
Alkaline Phosphatase: 112 U/L (ref 38–126)
Anion gap: 16 — ABNORMAL HIGH (ref 5–15)
BUN: 24 mg/dL — ABNORMAL HIGH (ref 6–20)
CO2: 20 mmol/L — ABNORMAL LOW (ref 22–32)
Calcium: 9 mg/dL (ref 8.9–10.3)
Chloride: 92 mmol/L — ABNORMAL LOW (ref 98–111)
Creatinine, Ser: 1.47 mg/dL — ABNORMAL HIGH (ref 0.61–1.24)
GFR, Estimated: 54 mL/min — ABNORMAL LOW (ref >60)
Glucose, Bld: 105 mg/dL — ABNORMAL HIGH (ref 70–99)
Potassium: 3.5 mmol/L (ref 3.5–5.1)
Sodium: 128 mmol/L — ABNORMAL LOW (ref 135–145)
Total Bilirubin: 1.2 mg/dL (ref 0.3–1.2)
Total Protein: 7.4 g/dL (ref 6.5–8.1)

## 2020-10-10 LAB — PREALBUMIN: Prealbumin: 5.5 mg/dL — ABNORMAL LOW (ref 18–38)

## 2020-10-10 LAB — CBC WITH DIFFERENTIAL/PLATELET
Abs Immature Granulocytes: 0.19 10*3/uL — ABNORMAL HIGH (ref 0.00–0.07)
Basophils Absolute: 0.1 10*3/uL (ref 0.0–0.1)
Basophils Relative: 0 %
Eosinophils Absolute: 0.1 10*3/uL (ref 0.0–0.5)
Eosinophils Relative: 1 %
HCT: 41.6 % (ref 39.0–52.0)
Hemoglobin: 14 g/dL (ref 13.0–17.0)
Immature Granulocytes: 1 %
Lymphocytes Relative: 9 %
Lymphs Abs: 2 10*3/uL (ref 0.7–4.0)
MCH: 29.2 pg (ref 26.0–34.0)
MCHC: 33.7 g/dL (ref 30.0–36.0)
MCV: 86.8 fL (ref 80.0–100.0)
Monocytes Absolute: 1.6 10*3/uL — ABNORMAL HIGH (ref 0.1–1.0)
Monocytes Relative: 7 %
Neutro Abs: 18.6 10*3/uL — ABNORMAL HIGH (ref 1.7–7.7)
Neutrophils Relative %: 82 %
Platelets: 375 10*3/uL (ref 150–400)
RBC: 4.79 MIL/uL (ref 4.22–5.81)
RDW: 12.5 % (ref 11.5–15.5)
WBC: 22.6 10*3/uL — ABNORMAL HIGH (ref 4.0–10.5)
nRBC: 0 % (ref 0.0–0.2)

## 2020-10-10 LAB — SURGICAL PCR SCREEN
MRSA, PCR: NEGATIVE
Staphylococcus aureus: POSITIVE — AB

## 2020-10-10 LAB — HIV ANTIBODY (ROUTINE TESTING W REFLEX): HIV Screen 4th Generation wRfx: NONREACTIVE

## 2020-10-10 LAB — C-REACTIVE PROTEIN: CRP: 26.6 mg/dL — ABNORMAL HIGH (ref <1.0)

## 2020-10-10 LAB — LACTIC ACID, PLASMA
Lactic Acid, Venous: 1.1 mmol/L (ref 0.5–1.9)
Lactic Acid, Venous: 1.1 mmol/L (ref 0.5–1.9)

## 2020-10-10 LAB — PROTIME-INR
INR: 1.1 (ref 0.8–1.2)
Prothrombin Time: 13.9 seconds (ref 11.4–15.2)

## 2020-10-10 LAB — CBG MONITORING, ED: Glucose-Capillary: 87 mg/dL (ref 70–99)

## 2020-10-10 LAB — SEDIMENTATION RATE: Sed Rate: 67 mm/hr — ABNORMAL HIGH (ref 0–16)

## 2020-10-10 LAB — APTT: aPTT: 29 seconds (ref 24–36)

## 2020-10-10 IMAGING — DX DG FOOT COMPLETE 3+V*L*
2 series · 3 of 3 positions shown · non-contrast
Comparison: [DATE]

CLINICAL DATA: Left foot pain since [REDACTED]. Diabetes. Redness and
warmth on the bottom and side of the foot.

EXAM:
LEFT FOOT - COMPLETE 3+ VIEW

[Series 1: foot · 0.14mm/px · 2 of 2 slices shown]
[im 1/2]
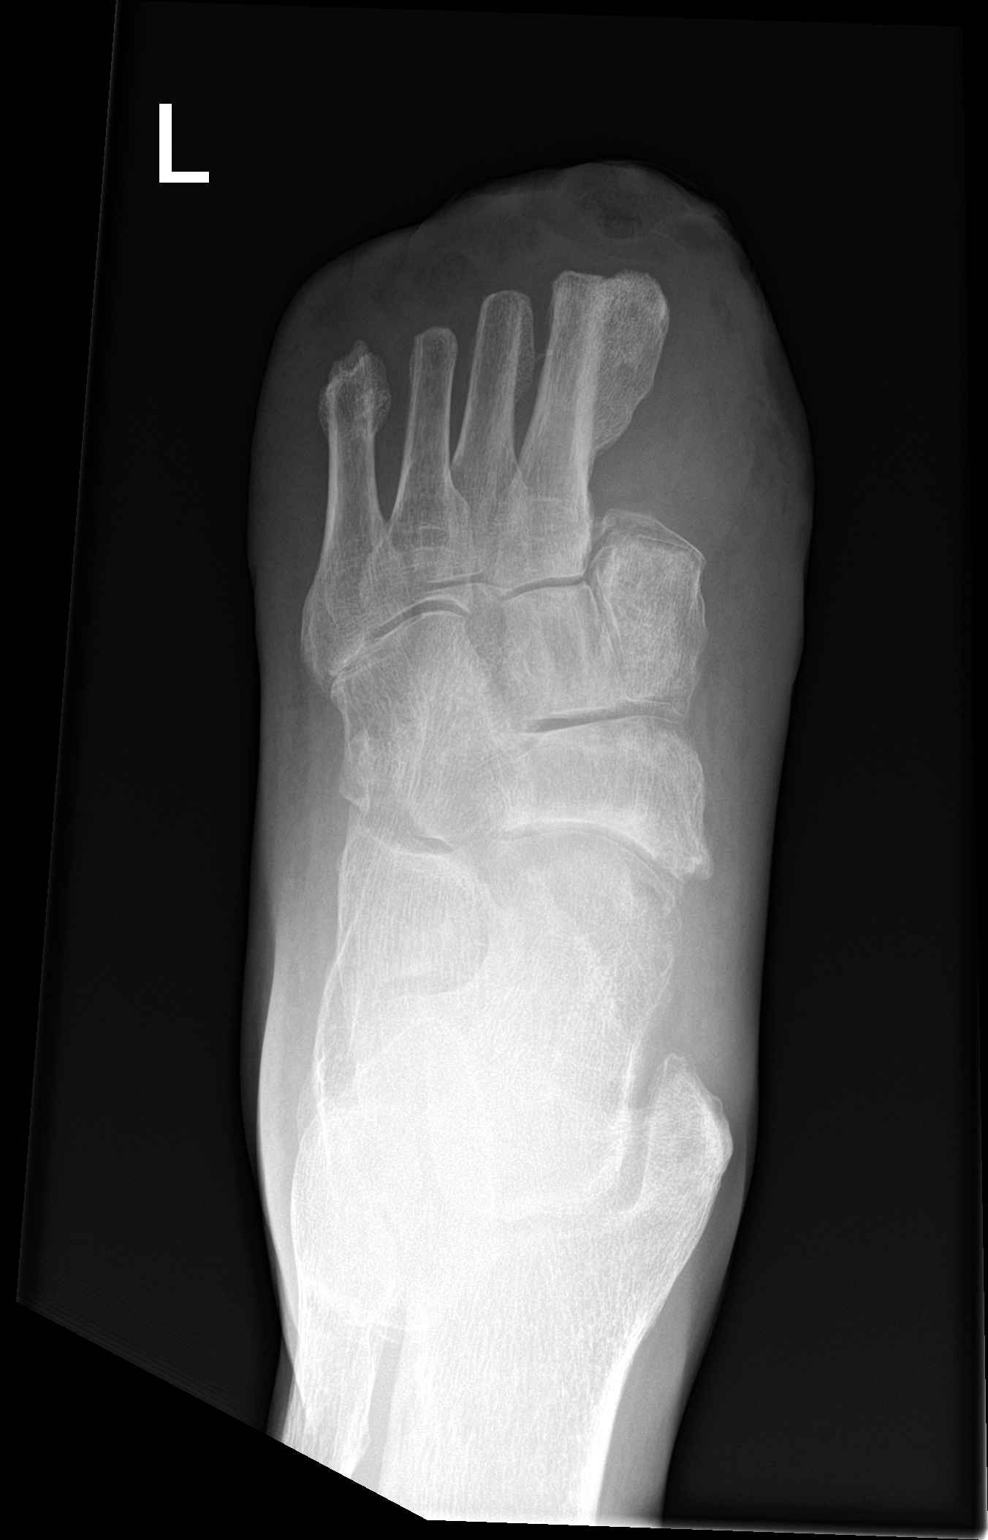
[im 2/2]
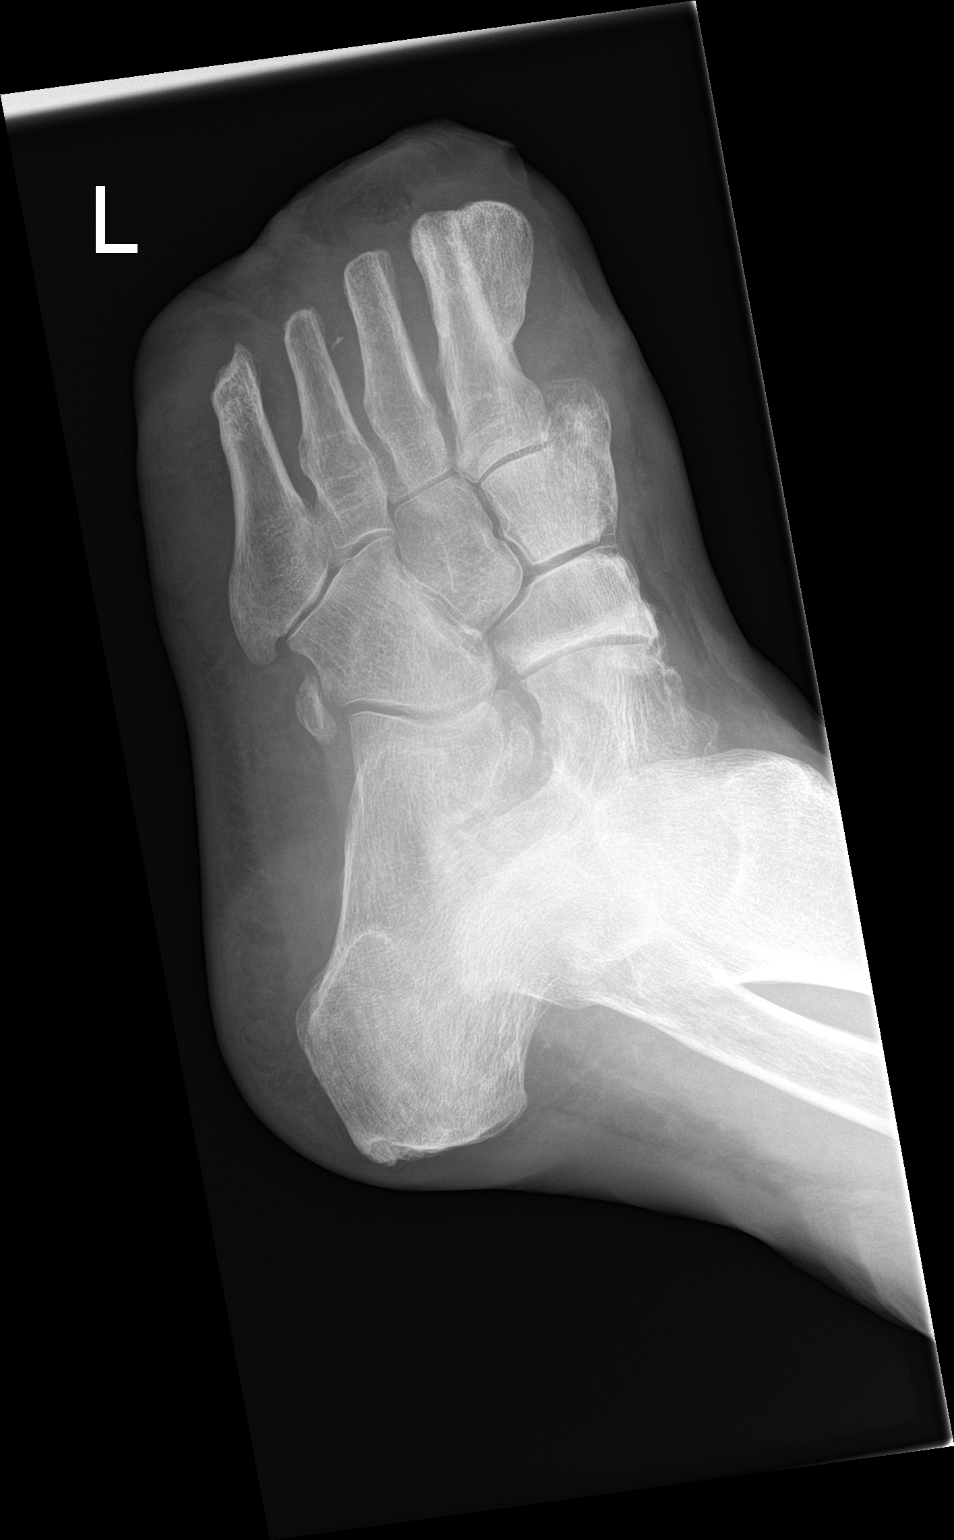

[leg]
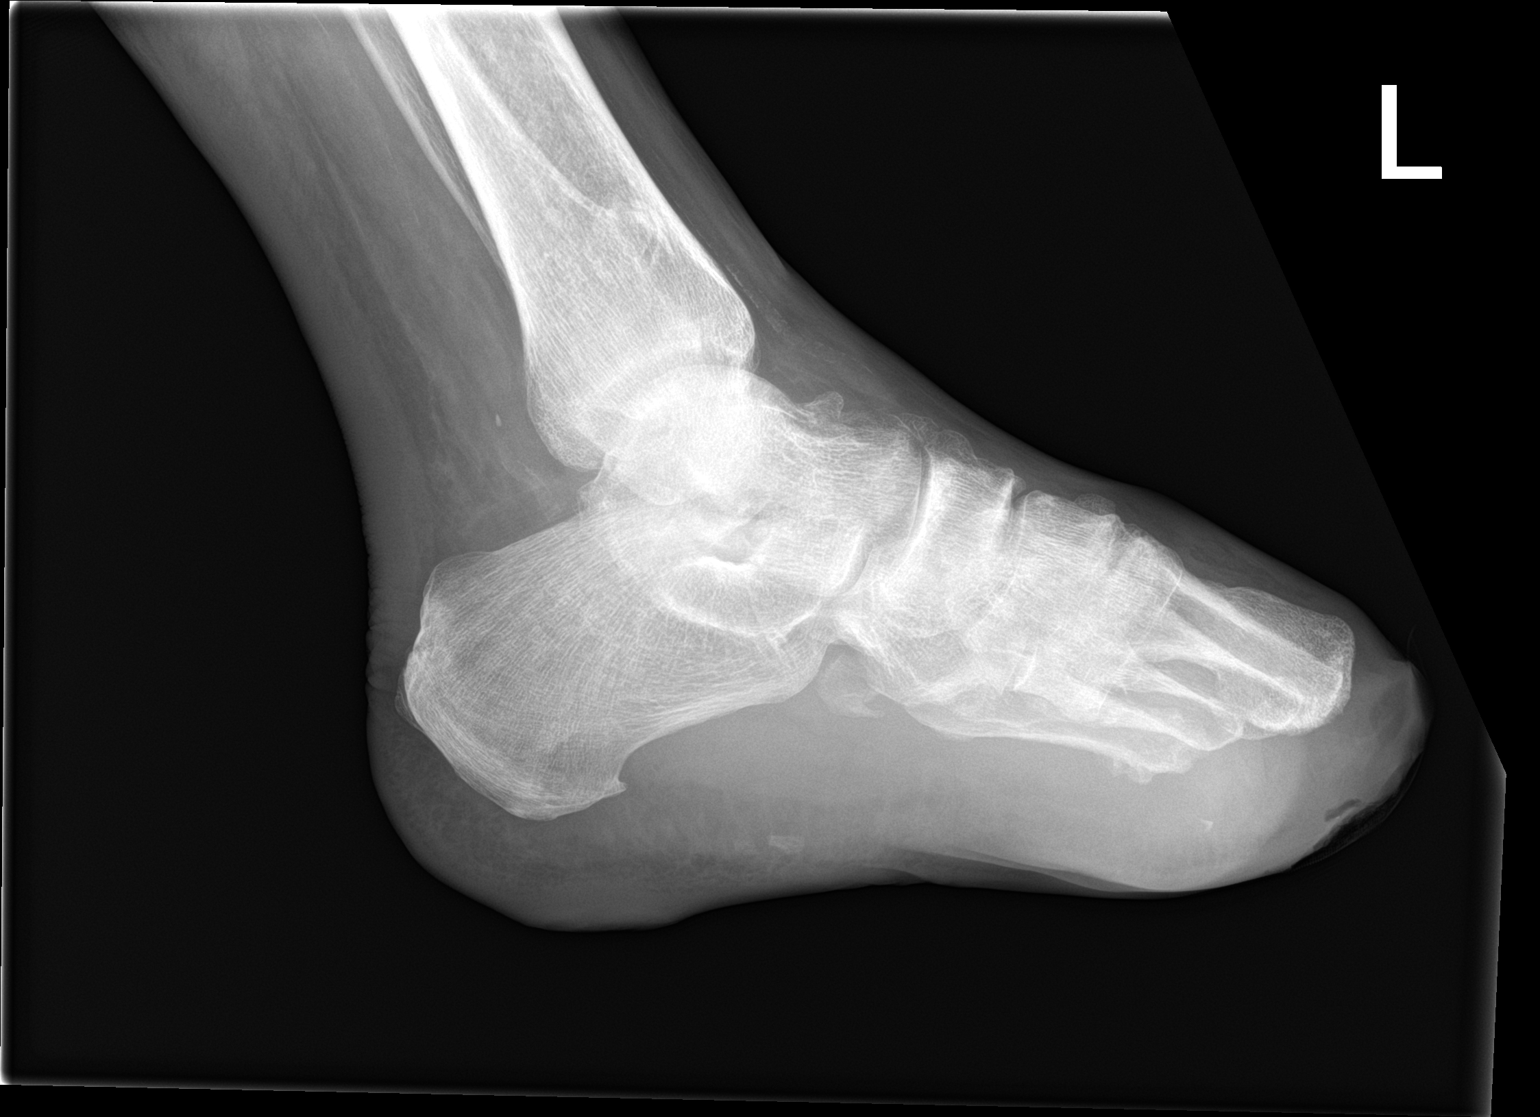

[3 of 3 positions shown; findings below may reference images not displayed]

FINDINGS: Previous amputations of the first ray at the tarsometatarsal level
and of the second through fifth rays at the mid metatarsal level.
Productive bone and callus formation demonstrated at the resection
stumps. Bone cortex appears intact. No evidence of cortical erosion
or bone destruction. No abnormal sclerosis. No focal bone lesion or
bone destruction. Degenerative changes in the intertarsal joints.
There is evidence of focal soft tissue gas in the superficial
subcutaneous tissues over the resection stump at the level of the
second metatarsal. This may represent focal cellulitis. Vascular
calcifications are seen.
IMPRESSION: Postoperative amputations. Focal soft tissue gas in the superficial
subcutaneous tissues over the resection stump at the level of the
second metatarsal, possibly indicating soft tissue infection. No
evidence of osteomyelitis.

## 2020-10-10 SURGERY — IRRIGATION AND DEBRIDEMENT EXTREMITY
Anesthesia: General | Site: Foot | Laterality: Left

## 2020-10-10 MED ORDER — ONDANSETRON HCL 4 MG/2ML IJ SOLN
INTRAMUSCULAR | Status: DC | PRN
  Administered 2020-10-10: 4 mg via INTRAVENOUS

## 2020-10-10 MED ORDER — SODIUM CHLORIDE 0.9 % IV SOLN: 250.0000 mL | INTRAVENOUS | Status: DC | PRN

## 2020-10-10 MED ORDER — NICOTINE 21 MG/24HR TD PT24
21.0000 mg | MEDICATED_PATCH | Freq: Once | TRANSDERMAL | Status: AC
  Administered 2020-10-10: 21 mg via TRANSDERMAL
  Filled 2020-10-10: qty 1

## 2020-10-10 MED ORDER — HYDROMORPHONE HCL 2 MG/ML IJ SOLN: 4.0000 mg | Freq: Once | INTRAMUSCULAR | Status: DC

## 2020-10-10 MED ORDER — VANCOMYCIN HCL 750 MG/150ML IV SOLN
750.0000 mg | Freq: Two times a day (BID) | INTRAVENOUS | Status: DC
  Administered 2020-10-10 – 2020-10-12 (×4): 750 mg via INTRAVENOUS
  Filled 2020-10-10 (×4): qty 150

## 2020-10-10 MED ORDER — METRONIDAZOLE IN NACL 5-0.79 MG/ML-% IV SOLN
500.0000 mg | Freq: Once | INTRAVENOUS | Status: AC
  Administered 2020-10-10: 500 mg via INTRAVENOUS
  Filled 2020-10-10: qty 100

## 2020-10-10 MED ORDER — FENTANYL CITRATE (PF) 250 MCG/5ML IJ SOLN
INTRAMUSCULAR | Status: DC | PRN
  Administered 2020-10-10: 50 ug via INTRAVENOUS

## 2020-10-10 MED ORDER — SODIUM CHLORIDE 0.9 % IV SOLN: INTRAVENOUS | Status: DC | PRN

## 2020-10-10 MED ORDER — PHENYLEPHRINE HCL-NACL 10-0.9 MG/250ML-% IV SOLN
INTRAVENOUS | Status: DC | PRN
  Administered 2020-10-10: 50 ug/min via INTRAVENOUS

## 2020-10-10 MED ORDER — ENOXAPARIN SODIUM 40 MG/0.4ML ~~LOC~~ SOLN
40.0000 mg | SUBCUTANEOUS | Status: DC
  Administered 2020-10-10 – 2020-10-11 (×2): 40 mg via SUBCUTANEOUS
  Filled 2020-10-10 (×2): qty 0.4

## 2020-10-10 MED ORDER — SERTRALINE HCL 50 MG PO TABS
150.0000 mg | ORAL_TABLET | Freq: Every day | ORAL | Status: DC
  Administered 2020-10-11 – 2020-10-14 (×4): 150 mg via ORAL
  Filled 2020-10-10 (×5): qty 1

## 2020-10-10 MED ORDER — ACETAMINOPHEN 160 MG/5ML PO SOLN: 1000.0000 mg | Freq: Once | ORAL | Status: DC | PRN

## 2020-10-10 MED ORDER — VANCOMYCIN HCL IN DEXTROSE 1-5 GM/200ML-% IV SOLN
1000.0000 mg | Freq: Once | INTRAVENOUS | Status: DC
  Filled 2020-10-10: qty 200

## 2020-10-10 MED ORDER — HYDROMORPHONE HCL 1 MG/ML IJ SOLN
0.5000 mg | INTRAMUSCULAR | Status: DC | PRN
  Filled 2020-10-10: qty 1

## 2020-10-10 MED ORDER — CHLORHEXIDINE GLUCONATE CLOTH 2 % EX PADS
6.0000 | MEDICATED_PAD | Freq: Every day | CUTANEOUS | Status: DC
  Administered 2020-10-11 – 2020-10-14 (×3): 6 via TOPICAL

## 2020-10-10 MED ORDER — ACETAMINOPHEN 500 MG PO TABS: 1000.0000 mg | ORAL_TABLET | Freq: Once | ORAL | Status: DC | PRN

## 2020-10-10 MED ORDER — ACETAMINOPHEN 325 MG PO TABS
650.0000 mg | ORAL_TABLET | ORAL | Status: DC | PRN
  Filled 2020-10-10: qty 2

## 2020-10-10 MED ORDER — SODIUM CHLORIDE 0.9% FLUSH: 3.0000 mL | INTRAVENOUS | Status: DC | PRN

## 2020-10-10 MED ORDER — FENTANYL CITRATE (PF) 250 MCG/5ML IJ SOLN
INTRAMUSCULAR | Status: AC
  Filled 2020-10-10: qty 5

## 2020-10-10 MED ORDER — BUPIVACAINE HCL (PF) 0.5 % IJ SOLN
INTRAMUSCULAR | Status: AC
  Filled 2020-10-10: qty 30

## 2020-10-10 MED ORDER — OXYCODONE-ACETAMINOPHEN 5-325 MG PO TABS: 1.0000 | ORAL_TABLET | ORAL | Status: DC | PRN

## 2020-10-10 MED ORDER — TOBRAMYCIN SULFATE 1.2 G IJ SOLR
INTRAMUSCULAR | Status: AC
  Filled 2020-10-10: qty 1.2

## 2020-10-10 MED ORDER — MORPHINE SULFATE (PF) 4 MG/ML IV SOLN
4.0000 mg | Freq: Once | INTRAVENOUS | Status: AC
  Administered 2020-10-10: 4 mg via INTRAVENOUS
  Filled 2020-10-10: qty 1

## 2020-10-10 MED ORDER — PROMETHAZINE HCL 25 MG PO TABS: 12.5000 mg | ORAL_TABLET | ORAL | Status: DC | PRN

## 2020-10-10 MED ORDER — AMLODIPINE BESYLATE 10 MG PO TABS
10.0000 mg | ORAL_TABLET | Freq: Every day | ORAL | Status: DC
  Administered 2020-10-11 – 2020-10-14 (×4): 10 mg via ORAL
  Filled 2020-10-10 (×5): qty 1

## 2020-10-10 MED ORDER — INSULIN ASPART 100 UNIT/ML ~~LOC~~ SOLN
0.0000 [IU] | Freq: Three times a day (TID) | SUBCUTANEOUS | Status: DC
  Administered 2020-10-11 – 2020-10-12 (×5): 2 [IU] via SUBCUTANEOUS
  Administered 2020-10-13: 5 [IU] via SUBCUTANEOUS

## 2020-10-10 MED ORDER — VANCOMYCIN HCL 2000 MG/400ML IV SOLN
2000.0000 mg | Freq: Once | INTRAVENOUS | Status: AC
  Administered 2020-10-10: 2000 mg via INTRAVENOUS
  Filled 2020-10-10: qty 400

## 2020-10-10 MED ORDER — ACETAMINOPHEN 10 MG/ML IV SOLN: 1000.0000 mg | Freq: Once | INTRAVENOUS | Status: DC | PRN

## 2020-10-10 MED ORDER — VANCOMYCIN HCL 1000 MG IV SOLR
INTRAVENOUS | Status: AC
  Filled 2020-10-10: qty 1000

## 2020-10-10 MED ORDER — LACTATED RINGERS IV SOLN: INTRAVENOUS | Status: AC

## 2020-10-10 MED ORDER — DEXAMETHASONE SODIUM PHOSPHATE 10 MG/ML IJ SOLN
INTRAMUSCULAR | Status: DC | PRN
  Administered 2020-10-10: 5 mg via INTRAVENOUS

## 2020-10-10 MED ORDER — MIDAZOLAM HCL 2 MG/2ML IJ SOLN
INTRAMUSCULAR | Status: AC
  Filled 2020-10-10: qty 2

## 2020-10-10 MED ORDER — PANTOPRAZOLE SODIUM 40 MG PO TBEC
40.0000 mg | DELAYED_RELEASE_TABLET | Freq: Every day | ORAL | Status: DC
  Administered 2020-10-11 – 2020-10-14 (×4): 40 mg via ORAL
  Filled 2020-10-10 (×5): qty 1

## 2020-10-10 MED ORDER — SODIUM CHLORIDE 0.9% FLUSH
3.0000 mL | Freq: Two times a day (BID) | INTRAVENOUS | Status: DC
  Administered 2020-10-11 – 2020-10-14 (×5): 3 mL via INTRAVENOUS

## 2020-10-10 MED ORDER — ONDANSETRON HCL 4 MG/2ML IJ SOLN
4.0000 mg | Freq: Four times a day (QID) | INTRAMUSCULAR | Status: DC | PRN
  Administered 2020-10-10 – 2020-10-11 (×3): 4 mg via INTRAVENOUS
  Filled 2020-10-10 (×3): qty 2

## 2020-10-10 MED ORDER — ONDANSETRON HCL 4 MG PO TABS: 4.0000 mg | ORAL_TABLET | Freq: Four times a day (QID) | ORAL | Status: DC | PRN

## 2020-10-10 MED ORDER — PROPOFOL 10 MG/ML IV BOLUS
INTRAVENOUS | Status: AC
  Filled 2020-10-10: qty 20

## 2020-10-10 MED ORDER — LIDOCAINE HCL 2 % IJ SOLN
INTRAMUSCULAR | Status: AC
  Filled 2020-10-10: qty 20

## 2020-10-10 MED ORDER — OXYCODONE HCL 5 MG/5ML PO SOLN: 5.0000 mg | Freq: Once | ORAL | Status: DC | PRN

## 2020-10-10 MED ORDER — ASPIRIN EC 81 MG PO TBEC
81.0000 mg | DELAYED_RELEASE_TABLET | Freq: Every day | ORAL | Status: DC
  Administered 2020-10-11 – 2020-10-14 (×4): 81 mg via ORAL
  Filled 2020-10-10 (×5): qty 1

## 2020-10-10 MED ORDER — ASPIRIN 81 MG PO TABS: 81.0000 mg | ORAL_TABLET | Freq: Every day | ORAL | Status: DC

## 2020-10-10 MED ORDER — METRONIDAZOLE IN NACL 5-0.79 MG/ML-% IV SOLN
INTRAVENOUS | Status: DC | PRN
  Administered 2020-10-10: 500 mg via INTRAVENOUS

## 2020-10-10 MED ORDER — OXYCODONE-ACETAMINOPHEN 5-325 MG PO TABS
1.0000 | ORAL_TABLET | Freq: Four times a day (QID) | ORAL | Status: DC | PRN
  Administered 2020-10-10 – 2020-10-14 (×10): 1 via ORAL
  Filled 2020-10-10 (×10): qty 1

## 2020-10-10 MED ORDER — ALBUTEROL SULFATE (2.5 MG/3ML) 0.083% IN NEBU: 2.5000 mg | INHALATION_SOLUTION | Freq: Four times a day (QID) | RESPIRATORY_TRACT | Status: DC | PRN

## 2020-10-10 MED ORDER — ATORVASTATIN CALCIUM 10 MG PO TABS
10.0000 mg | ORAL_TABLET | Freq: Every day | ORAL | Status: DC
  Administered 2020-10-11 – 2020-10-14 (×4): 10 mg via ORAL
  Filled 2020-10-10 (×5): qty 1

## 2020-10-10 MED ORDER — ONDANSETRON HCL 4 MG/2ML IJ SOLN
4.0000 mg | Freq: Once | INTRAMUSCULAR | Status: AC
  Administered 2020-10-10: 4 mg via INTRAVENOUS
  Filled 2020-10-10: qty 2

## 2020-10-10 MED ORDER — SODIUM CHLORIDE 0.9 % IV SOLN
2.0000 g | Freq: Three times a day (TID) | INTRAVENOUS | Status: DC
  Administered 2020-10-10 – 2020-10-12 (×7): 2 g via INTRAVENOUS
  Filled 2020-10-10 (×7): qty 2

## 2020-10-10 MED ORDER — METRONIDAZOLE 500 MG PO TABS
500.0000 mg | ORAL_TABLET | Freq: Three times a day (TID) | ORAL | Status: DC
  Administered 2020-10-10 – 2020-10-12 (×5): 500 mg via ORAL
  Filled 2020-10-10 (×5): qty 1

## 2020-10-10 MED ORDER — FENTANYL CITRATE (PF) 100 MCG/2ML IJ SOLN: 25.0000 ug | INTRAMUSCULAR | Status: DC | PRN

## 2020-10-10 MED ORDER — BUPIVACAINE HCL (PF) 0.5 % IJ SOLN
INTRAMUSCULAR | Status: DC | PRN
  Administered 2020-10-10: 28 mL

## 2020-10-10 MED ORDER — OXYCODONE HCL 5 MG PO TABS: 5.0000 mg | ORAL_TABLET | Freq: Once | ORAL | Status: DC | PRN

## 2020-10-10 MED ORDER — MUPIROCIN 2 % EX OINT
1.0000 "application " | TOPICAL_OINTMENT | Freq: Two times a day (BID) | CUTANEOUS | Status: DC
  Administered 2020-10-10 – 2020-10-14 (×7): 1 via NASAL
  Filled 2020-10-10: qty 22

## 2020-10-10 MED ORDER — SODIUM CHLORIDE 0.9 % IR SOLN
Status: DC | PRN
  Administered 2020-10-10: 3000 mL

## 2020-10-10 MED ORDER — GABAPENTIN 300 MG PO CAPS
300.0000 mg | ORAL_CAPSULE | Freq: Two times a day (BID) | ORAL | Status: DC
  Administered 2020-10-11 – 2020-10-14 (×4): 300 mg via ORAL
  Filled 2020-10-10 (×7): qty 1

## 2020-10-10 MED ORDER — METRONIDAZOLE IN NACL 5-0.79 MG/ML-% IV SOLN
INTRAVENOUS | Status: AC
  Filled 2020-10-10: qty 100

## 2020-10-10 MED ORDER — SODIUM CHLORIDE 0.9 % IV SOLN
2.0000 g | Freq: Once | INTRAVENOUS | Status: AC
  Administered 2020-10-10: 2 g via INTRAVENOUS
  Filled 2020-10-10: qty 2

## 2020-10-10 MED ORDER — PROPOFOL 10 MG/ML IV BOLUS
INTRAVENOUS | Status: DC | PRN
  Administered 2020-10-10: 110 mg via INTRAVENOUS
  Administered 2020-10-10: 50 mg via INTRAVENOUS

## 2020-10-10 MED ORDER — HYDROMORPHONE HCL 1 MG/ML IJ SOLN
1.0000 mg | INTRAMUSCULAR | Status: DC | PRN
  Administered 2020-10-10 – 2020-10-14 (×8): 1 mg via INTRAVENOUS
  Filled 2020-10-10 (×9): qty 1

## 2020-10-10 MED ORDER — CLONAZEPAM 1 MG PO TABS
1.0000 mg | ORAL_TABLET | Freq: Every day | ORAL | Status: DC
  Administered 2020-10-11 – 2020-10-13 (×3): 1 mg via ORAL
  Filled 2020-10-10 (×3): qty 1

## 2020-10-10 MED ORDER — HYDROMORPHONE HCL 1 MG/ML IJ SOLN
4.0000 mg | Freq: Once | INTRAMUSCULAR | Status: AC
  Administered 2020-10-10: 4 mg via INTRAVENOUS
  Filled 2020-10-10: qty 4

## 2020-10-10 SURGICAL SUPPLY — 55 items
BLADE SAW SGTL 83.5X18.5 (BLADE) ×2 IMPLANT
BLADE SURG 15 STRL LF DISP TIS (BLADE) ×3 IMPLANT
BLADE SURG 15 STRL SS (BLADE) ×3
BNDG COHESIVE 4X5 TAN STRL (GAUZE/BANDAGES/DRESSINGS) ×1 IMPLANT
BNDG COHESIVE 6X5 TAN STRL LF (GAUZE/BANDAGES/DRESSINGS) IMPLANT
BNDG ELASTIC 4X5.8 VLCR STR LF (GAUZE/BANDAGES/DRESSINGS) IMPLANT
BNDG ESMARK 4X9 LF (GAUZE/BANDAGES/DRESSINGS) ×2 IMPLANT
BNDG GAUZE ELAST 4 BULKY (GAUZE/BANDAGES/DRESSINGS) ×3 IMPLANT
BOWL SMART MIX CTS (DISPOSABLE) IMPLANT
CHLORAPREP W/TINT 26 (MISCELLANEOUS) ×1 IMPLANT
COVER SURGICAL LIGHT HANDLE (MISCELLANEOUS) ×2 IMPLANT
COVER WAND RF STERILE (DRAPES) ×1 IMPLANT
CUFF TOURN SGL QUICK 18X4 (TOURNIQUET CUFF) ×2 IMPLANT
CUFF TOURN SGL QUICK 34 (TOURNIQUET CUFF)
CUFF TRNQT CYL 34X4.125X (TOURNIQUET CUFF) ×1 IMPLANT
DRAPE OEC MINIVIEW 54X84 (DRAPES) ×1 IMPLANT
DRAPE U-SHAPE 47X51 STRL (DRAPES) ×2 IMPLANT
DRSG PAD ABDOMINAL 8X10 ST (GAUZE/BANDAGES/DRESSINGS) ×1 IMPLANT
ELECT CAUTERY BLADE 6.4 (BLADE) ×2 IMPLANT
ELECT REM PT RETURN 9FT ADLT (ELECTROSURGICAL) ×2
ELECTRODE REM PT RTRN 9FT ADLT (ELECTROSURGICAL) IMPLANT
GAUZE SPONGE 4X4 12PLY STRL (GAUZE/BANDAGES/DRESSINGS) ×1 IMPLANT
GAUZE SPONGE 4X4 12PLY STRL LF (GAUZE/BANDAGES/DRESSINGS) ×1 IMPLANT
GAUZE XEROFORM 1X8 LF (GAUZE/BANDAGES/DRESSINGS) ×1 IMPLANT
GLOVE BIO SURGEON STRL SZ8 (GLOVE) ×2 IMPLANT
GLOVE BIOGEL PI IND STRL 8 (GLOVE) ×1 IMPLANT
GLOVE BIOGEL PI INDICATOR 8 (GLOVE) ×1
GOWN STRL REUS W/ TWL LRG LVL3 (GOWN DISPOSABLE) ×2 IMPLANT
GOWN STRL REUS W/TWL LRG LVL3 (GOWN DISPOSABLE) ×2
HANDPIECE INTERPULSE COAX TIP (DISPOSABLE)
KIT BASIN OR (CUSTOM PROCEDURE TRAY) ×2 IMPLANT
KIT TURNOVER KIT B (KITS) ×2 IMPLANT
MANIFOLD NEPTUNE II (INSTRUMENTS) ×2 IMPLANT
NDL HYPO 25GX1X1/2 BEV (NEEDLE) ×1 IMPLANT
NEEDLE HYPO 25GX1X1/2 BEV (NEEDLE) ×2 IMPLANT
NS IRRIG 1000ML POUR BTL (IV SOLUTION) ×2 IMPLANT
PACK ORTHO EXTREMITY (CUSTOM PROCEDURE TRAY) ×2 IMPLANT
PAD ABD 8X10 STRL (GAUZE/BANDAGES/DRESSINGS) ×1 IMPLANT
PAD ARMBOARD 7.5X6 YLW CONV (MISCELLANEOUS) ×3 IMPLANT
PAD CAST 4YDX4 CTTN HI CHSV (CAST SUPPLIES) ×1 IMPLANT
PADDING CAST COTTON 4X4 STRL (CAST SUPPLIES)
PADDING CAST COTTON 6X4 STRL (CAST SUPPLIES) ×1 IMPLANT
SET HNDPC FAN SPRY TIP SCT (DISPOSABLE) IMPLANT
SOL PREP POV-IOD 4OZ 10% (MISCELLANEOUS) ×3 IMPLANT
STAPLER VISISTAT (STAPLE) ×1 IMPLANT
STAPLER VISISTAT 35W (STAPLE) ×2 IMPLANT
STOCKINETTE IMPERVIOUS 9X36 MD (GAUZE/BANDAGES/DRESSINGS) ×2 IMPLANT
SUT PROLENE 3 0 PS 2 (SUTURE) ×1 IMPLANT
SWAB CULTURE LIQ STUART DBL (MISCELLANEOUS) ×2 IMPLANT
SWAB CULTURE LIQUID MINI MALE (MISCELLANEOUS) ×2 IMPLANT
SYR CONTROL 10ML LL (SYRINGE) ×2 IMPLANT
TOWEL GREEN STERILE (TOWEL DISPOSABLE) ×2 IMPLANT
TOWEL GREEN STERILE FF (TOWEL DISPOSABLE) ×2 IMPLANT
TUBE CONNECTING 12X1/4 (SUCTIONS) ×2 IMPLANT
YANKAUER SUCT BULB TIP NO VENT (SUCTIONS) ×2 IMPLANT

## 2020-10-10 NOTE — Op Note (Addendum)
OPERATIVE REPORT Patient name: Charles Thompson MRN: [id number] DOB: 1961-08-03  DOS: 09/09/2020  Preop Dx: Abscess/cellulitis left foot Postop Dx: same  Procedure:  1.  Incision and drainage left foot abscess  Surgeon: [name] DPM  Anesthesia: General anesthesia  Hemostasis: Ankle tourniquet inflated to a pressure of without esmarch exsanguination   EBL: 100 mL Materials: None Injectables: 25 mL of 0.5% Marcaine plain in ankle block fashion Pathology: Deep wound culture sent to pathology for culture and sensitivity  Condition: The patient tolerated the procedure and anesthesia well. No complications noted or reported   Justification for procedure: The patient is a 59 y.o. male who presents today for surgical correction of abscess to the left foot. All conservative modalities of been unsuccessful in providing any sort of satisfactory alleviation of symptoms with the patient. The patient was told benefits as well as possible side effects of the surgery. The patient consented for surgical correction. The patient consent form was reviewed. All patient questions were answered. No guarantees were expressed or implied. The patient and the surgeon both signed the patient consent form with the witness present and placed in the patient's chart.   Procedure in Detail: The patient was brought to the operating room, placed in the operating table in the supine position at which time an aseptic scrub and drape were performed about the patient's respective lower extremity after anesthesia was induced as described above. Attention was then directed to the surgical area where procedure number one commenced.  Procedure #1: Incision and drainage left foot abscess And original curvilinear incision was planned and made about the medial aspect of the left foot and ankle extending along the neurovascular bundle using a surgical 15 scalpel.  This was the area that was most erythematous.   After incision immediate heavy purulence was expressed from the incision site.  The incision measured approximately 5 cm in length.  As purulence was expressed from the incision site, there was some immediate purulence that erupted from the distal ulcer site of the foot stump.  After appropriate exploration and expression of purulence from the original incision site additional attention was directed to the distal foot stump ulcer.  An additional incision was planned and made about the medial aspect of the left forefoot beginning at the distal ulceration site and extending medial and proximal.  Incision was approximately 5 cm in length.  Additional heavy purulence was expressed.  The plantar fascia of the foot was identified and full of purulence and necrosis.  The devitalized tissue including muscle and deep fascial compartments was expressed and removed from this area using a combination of blunt soft tissue dissection as well as sharp excision of devitalized tissue.  After the heavy purulence that was noted along the plantar compartments of the foot the decision was made to connect the two incisions which had been made proximal and distal.  This created a curvilinear/Z-type incision along the plantar aspect of the foot.  The entire incision was opened with blunt dissection and heavy purulence was noted.  All devitalized necrotic tissue was sharply excised away.  At this time copious irrigation with pulse lavage and 3 L of normal saline was utilized to copiously irrigate the plantar foot.  Saline wet to dry gauze was applied and skin staples were utilized along a portion of the incision for retention.  Dry sterile compressive dressings were then applied to all previously mentioned incision sites about the patient's lower extremity. The tourniquet which was used for  hemostasis was deflated.   The patient was then transferred from the operating room to the recovery room having tolerated the procedure and  anesthesia well. All vital signs are stable. After a brief stay in the recovery room the patient was readmitted to room with adequate prescriptions for analgesia. Verbal as well as written instructions were provided for the patient regarding wound care. The patient is to keep the dressings clean dry and intact until they are to follow surgeon Dr. Gala Lewandowsky in the office upon discharge.   [name], DPM [facility]  Dr. [name], DPM    [address] [place], [place] [address]                Office [phone]  Fax [phone]

## 2020-10-10 NOTE — ED Triage Notes (Signed)
Pt has been having pain in his L foot since last Friday when he saw his foot doctor. Pt is diabetic and has partial amputation of foot, redness and warmth on bottom and side of foot.

## 2020-10-10 NOTE — Progress Notes (Signed)
Evans MD notified that patient's post operative dressing having two spots of blood on the ace wrap. Dressing was changed, 4x4 guaze, Abdominal pads, kerlex and two ace wraps. Will continue to monitor.

## 2020-10-10 NOTE — H&P (Addendum)
History and Physical    [name] YJE:[id number] DOB: 10-15-1961 DOA: 10/10/2020  Referring MD/NP/PA: Carlynn Purl, Do PCP: Charles Pao, MD  Patient coming from: Home  Chief Complaint: Left foot pain  I have personally briefly reviewed patient's old medical records in [place]   HPI: Charles Thompson is a 59 y.o. male with medical history significant of diabetes mellitus type 2 with peripheral neuropathy, s/p amputations, hypertension, hyperlipidemia, anxiety, depression, and tobacco abuse presents with complaints of severe left foot pain.  Patient notes that he noticed a new ulcer of his left foot approximately 3 weeks ago where he previously had transmetatarsal amputation.  Complains of having a constant pins and needle like pain on the bottom of his foot.  He had also noticed drainage with blood and possibly pus.  Patient noted that he had been in dealing with the ulcer of the right foot, but states that that is healed.  He followed up with Charles Thompson of podiatry last week due to the pain in the left foot.  During the appointment he had trimmed some of the callus on the left foot and evaluated the wound.  Since that visit he reports that pain has become more severe and he has developed redness over the bottom of the foot.  Unable to keep the shoe inserts in his shoes on the left foot due to them worsening his pain.  Tried taking oxycodone for the pain without relief as well as at old prescription of possibly doxycycline for the last 2 days without any improvement.  Associated symptoms included chills, no appetite for the last 2 to 3 days due to pain, nausea, and vomiting.  Denies having any significant fever, diarrhea, chest pain, calf pain, or shortness of breath.  ED Course: Upon admission into the emergency department patient was seen to be afebrile, pulse 95-1 04, and all other vital signs within normal limits.  Labs significant for WBC 22.6, sodium 128, chloride 92, CO2  20, BUN 24, creatinine 1.47, and anion gap 16.  Influenza and COVID-19 screening was negative.  X-rays of the left foot revealed focal soft tissue gas in the subcutaneous tissues concerning for infection.  Patient had been started on lactated Ringer's 150 mL/h, vancomycin, metronidazole, and cefepime.  MRSA screen was noted to be positive. [facility] called to admit.  Review of Systems  Constitutional: Positive for chills and malaise/fatigue. Negative for fever.  HENT: Negative for ear discharge and nosebleeds.   Eyes: Negative for photophobia and pain.  Respiratory: Positive for wheezing. Negative for hemoptysis.   Cardiovascular: Negative for chest pain and orthopnea.  Gastrointestinal: Positive for nausea and vomiting. Negative for abdominal pain and diarrhea.  Genitourinary: Negative for dysuria and frequency.  Musculoskeletal: Positive for joint pain.  Skin:       Positive for skin color change  Neurological: Positive for sensory change. Negative for focal weakness.  Psychiatric/Behavioral: Positive for substance abuse. Negative for memory loss.    Past Medical History:  Diagnosis Date  . Anxiety   . Arthritis    neck  . Chronic ulcer of right foot ([place])   . Diabetic peripheral neuropathy ([place])   . First degree heart block   . GERD (gastroesophageal reflux disease)   . History of osteomyelitis    bilateral feet  . Hyperlipidemia   . Hypertension    followed by pcp  (03-07-2020 per pt never had a stress test)  . MDD (major depressive disorder)   . Type 2  diabetes mellitus with insulin therapy ([place])    followed by pcp   (03-07-2020  pt stated checks blood sugar twice dialy,  fasting sugar--- 100-160)    Past Surgical History:  Procedure Laterality Date  . ALLOGRAFT APPLICATION Right 0/07/9832   Procedure: APPLICATION OF INTEGRA;  Surgeon: Evelina Bucy, DPM;  Location: [facility];  Service: Podiatry;  Laterality: Right;  . AMPUTATION Left 06/22/2018   Procedure:  LEFT FOOT FIRST RAY AMPUTATION;  Surgeon: Newt Minion, MD;  Location: [facility];  Service: Orthopedics;  Laterality: Left;  . AMPUTATION Left 09/09/2018   Procedure: Left Transmetatarsal Amputation;  Surgeon: Newt Minion, MD;  Location: [facility];  Service: Orthopedics;  Laterality: Left;  . EYE SURGERY    . TENDON LENGTHENING Right 03/19/2020   Procedure: TENDOACHILLES LENGTHENING;  Surgeon: Evelina Bucy, DPM;  Location: [facility];  Service: Podiatry;  Laterality: Right;  . TOE AMPUTATION Right 12-11-2015  @[facility]    great toe  . WOUND DEBRIDEMENT Right 03/19/2020   Procedure: DEBRIDEMENT WOUND;  Surgeon: Evelina Bucy, DPM;  Location: [facility];  Service: Podiatry;  Laterality: Right;     reports that he has been smoking cigarettes. He has been smoking about 0.75 packs per day. He has never used smokeless tobacco. He reports current alcohol use. He reports previous drug use. Drug: Marijuana.  No Known Allergies  Family History  Problem Relation Age of Onset  . Diabetes Other     Prior to Admission medications   Medication Sig Start Date End Date Taking? Authorizing Provider  amLODipine (NORVASC) 10 MG tablet Take 10 mg by mouth daily.  04/04/15  Yes [provider]  atorvastatin (LIPITOR) 10 MG tablet Take 10 mg by mouth daily.  04/04/15  Yes [provider]  fluticasone (FLONASE) 50 MCG/ACT nasal spray Place 1 spray into both nostrils 2 (two) times daily. 03/18/20  Yes [provider]  Lancets (ONETOUCH DELICA PLUS [id number]) [place] USE TO TEST BLOOD SUGAR THREE TIMES DAILY 01/09/20  Yes [provider]  lisinopril-hydrochlorothiazide (PRINZIDE,ZESTORETIC) 20-12.5 MG tablet Take 1 tablet by mouth 2 (two) times daily.  06/17/18  Yes [provider]  omeprazole (PRILOSEC) 20 MG capsule Take 20 mg by mouth daily as needed (heartburn).  02/08/15  Yes [provider]  ONETOUCH VERIO test strip USE TO TEST BLOOD SUGAR  THREE TIMES DAILY 01/09/20  Yes [provider]  sertraline (ZOLOFT) 100 MG tablet TAKE 1 AND 1/2 TABLETS(150 MG) BY MOUTH DAILY 07/28/20  Yes Pucilowski, Marchia Bond, MD  SYNJARDY 12.03-999 MG TABS  03/16/20  Yes [provider]  TOUJEO MAX SOLOSTAR 300 UNIT/ML SOPN Inject 70 Units into the skin 2 (two) times daily.  07/27/18  Yes [provider]  aspirin 81 MG tablet Take 81 mg by mouth daily.    [provider]  clonazePAM (KLONOPIN) 1 MG tablet Take 1 tablet (1 mg total) by mouth at bedtime. 07/31/20 10/29/20  Pucilowski, Marchia Bond, MD  doxycycline (VIBRA-TABS) 100 MG tablet Take 1 tablet (100 mg total) by mouth 2 (two) times daily. 07/16/20   Evelina Bucy, DPM  gabapentin (NEURONTIN) 300 MG capsule Take 300 mg by mouth 2 (two) times daily.  10/08/15   [provider]  oxyCODONE-acetaminophen (PERCOCET) 5-325 MG tablet Take 1 tablet by mouth every 4 (four) hours as needed for severe pain. 03/19/20   Evelina Bucy, DPM    Physical Exam:  Constitutional: Older  male who appears to be in discomfort Vitals:   10/10/20 0506 10/10/20 0523 10/10/20 0545 10/10/20 0645  BP: 133/85  (!) 145/80 122/67  Pulse: 95  95 97  Resp:   15 14  Temp:      TempSrc:      SpO2: 97%  98% 93%  Weight:  104.3 kg    Height:  6' (1.829 m)     Eyes: PERRL, lids and conjunctivae normal ENMT: Mucous membranes are dry. Posterior pharynx clear of any exudate or lesions.  Neck: normal, supple, no masses, no thyromegaly Respiratory: Normal respiratory effort with expiratory wheeze appreciated.  Patient able to talk in complete sentences and O2 saturation maintained on room air. Cardiovascular: Regular rate and rhythm, no murmurs / rubs / gallops. No extremity edema. 2+ pedal pulses. No carotid bruits.  Abdomen: no tenderness, no masses palpated. No hepatosplenomegaly. Bowel sounds positive.  Musculoskeletal: no clubbing / cyanosis.  Amputation of the great toe on the right  foot. Transmetatarsal amputation on the left foot. Skin: Ulceration present of the left foot with fat layer exposed near previous amputation site with serosanguineous discharge noted on the bandage when removed.  Erythema noted of the pad of the foot going back to the heel.     Neurologic: CN 2-12 grossly intact.  Abnormal sensation, DTR normal. Strength 5/5 in all 4.  Psychiatric: Normal judgment and insight. Alert and oriented x 3. Normal mood.     Labs on Admission: I have personally reviewed following labs and imaging studies  CBC: Recent Labs  Lab 10/10/20 0244  WBC 22.6*  NEUTROABS 18.6*  HGB 14.0  HCT 41.6  MCV 86.8  PLT 024   Basic Metabolic Panel: Recent Labs  Lab 10/10/20 0244  NA 128*  K 3.5  CL 92*  CO2 20*  GLUCOSE 105*  BUN 24*  CREATININE 1.47*  CALCIUM 9.0   GFR: Estimated Creatinine Clearance: 66.7 mL/min (A) (by C-G formula based on SCr of 1.47 mg/dL (H)). Liver Function Tests: Recent Labs  Lab 10/10/20 0244  AST 17  ALT 23  ALKPHOS 112  BILITOT 1.2  PROT 7.4  ALBUMIN 3.3*   No results for input(s): LIPASE, AMYLASE in the last 168 hours. No results for input(s): AMMONIA in the last 168 hours. Coagulation Profile: Recent Labs  Lab 10/10/20 0600  INR 1.1   Cardiac Enzymes: No results for input(s): CKTOTAL, CKMB, CKMBINDEX, TROPONINI in the last 168 hours. BNP (last 3 results) No results for input(s): PROBNP in the last 8760 hours. HbA1C: No results for input(s): HGBA1C in the last 72 hours. CBG: No results for input(s): GLUCAP in the last 168 hours. Lipid Profile: No results for input(s): CHOL, HDL, LDLCALC, TRIG, CHOLHDL, LDLDIRECT in the last 72 hours. Thyroid Function Tests: No results for input(s): TSH, T4TOTAL, FREET4, T3FREE, THYROIDAB in the last 72 hours. Anemia Panel: No results for input(s): VITAMINB12, FOLATE, FERRITIN, TIBC, IRON, RETICCTPCT in the last 72 hours. Urine analysis:    Component Value Date/Time    COLORURINE YELLOW 12/18/2007 1926   APPEARANCEUR CLEAR 12/18/2007 1926   LABSPEC 1.040 (H) 12/18/2007 1926   PHURINE 6.5 12/18/2007 1926   GLUCOSEU >1000 (A) 12/18/2007 1926   HGBUR NEGATIVE 12/18/2007 1926   HGBUR NEGATIVE POINT OF CARE RESULT 12/18/2007 1926   BILIRUBINUR NEGATIVE 12/18/2007 1926   KETONESUR 40 (A) 12/18/2007 1926   PROTEINUR NEGATIVE 12/18/2007 1926   UROBILINOGEN 1.0 12/18/2007 1926   NITRITE NEGATIVE 12/18/2007 1926   LEUKOCYTESUR NEGATIVE  12/18/2007 1926   Sepsis Labs: Recent Results (from the past 240 hour(s))  Resp Panel by RT-PCR (Flu A&B, Covid) Nasopharyngeal Swab     Status: None   Collection Time: 10/10/20  5:57 AM   Specimen: Nasopharyngeal Swab; Nasopharyngeal(NP) swabs in vial transport medium  Result Value Ref Range Status   SARS Coronavirus 2 by RT PCR NEGATIVE NEGATIVE Final    Comment: (NOTE) SARS-CoV-2 target nucleic acids are NOT DETECTED.  The SARS-CoV-2 RNA is generally detectable in upper respiratory specimens during the acute phase of infection. The lowest concentration of SARS-CoV-2 viral copies this assay can detect is 138 copies/mL. A negative result does not preclude SARS-Cov-2 infection and should not be used as the sole basis for treatment or other patient management decisions. A negative result may occur with  improper specimen collection/handling, submission of specimen other than nasopharyngeal swab, presence of viral mutation(s) within the areas targeted by this assay, and inadequate number of viral copies(<138 copies/mL). A negative result must be combined with clinical observations, patient history, and epidemiological information. The expected result is Negative.  Fact Sheet for Patients:  EntrepreneurPulse.com.au  Fact Sheet for Healthcare Providers:  IncredibleEmployment.be  This test is no t yet approved or cleared by the [place] FDA and  has been authorized for detection  and/or diagnosis of SARS-CoV-2 by FDA under an Emergency Use Authorization (EUA). This EUA will remain  in effect (meaning this test can be used) for the duration of the COVID-19 declaration under Section 564(b)(1) of the Act, 21 U.S.C.section 360bbb-3(b)(1), unless the authorization is terminated  or revoked sooner.       Influenza A by PCR NEGATIVE NEGATIVE Final   Influenza B by PCR NEGATIVE NEGATIVE Final    Comment: (NOTE) The Xpert Xpress SARS-CoV-2/FLU/RSV plus assay is intended as an aid in the diagnosis of influenza from Nasopharyngeal swab specimens and should not be used as a sole basis for treatment. Nasal washings and aspirates are unacceptable for Xpert Xpress SARS-CoV-2/FLU/RSV testing.  Fact Sheet for Patients: EntrepreneurPulse.com.au  Fact Sheet for Healthcare Providers: IncredibleEmployment.be  This test is not yet approved or cleared by the [place] FDA and has been authorized for detection and/or diagnosis of SARS-CoV-2 by FDA under an Emergency Use Authorization (EUA). This EUA will remain in effect (meaning this test can be used) for the duration of the COVID-19 declaration under Section 564(b)(1) of the Act, 21 U.S.C. section 360bbb-3(b)(1), unless the authorization is terminated or revoked.  Performed at [facility], [place] [address], [place], [place] [address]      Radiological Exams on Admission: DG Foot Complete Left  Result Date: 10/10/2020 CLINICAL DATA:  Left foot pain since Friday. Diabetes. Redness and warmth on the bottom and side of the foot. EXAM: LEFT FOOT - COMPLETE 3+ VIEW COMPARISON:  10/04/2020 FINDINGS: Previous amputations of the first ray at the tarsometatarsal level and of the second through fifth rays at the mid metatarsal level. Productive bone and callus formation demonstrated at the resection stumps. Bone cortex appears intact. No evidence of cortical erosion or bone destruction. No  abnormal sclerosis. No focal bone lesion or bone destruction. Degenerative changes in the intertarsal joints. There is evidence of focal soft tissue gas in the superficial subcutaneous tissues over the resection stump at the level of the second metatarsal. This may represent focal cellulitis. Vascular calcifications are seen. IMPRESSION: Postoperative amputations. Focal soft tissue gas in the superficial subcutaneous tissues over the resection stump at the level of  the second metatarsal, possibly indicating soft tissue infection. No evidence of osteomyelitis. Electronically Signed   By: Lucienne Capers M.D.   On: 10/10/2020 02:49    X-rays of the left foot: Independently reviewed.  Soft tissue swelling with signs focal gas  Assessment/Plan  Sepsis secondary to cellulitis left diabetic foot ulcer: Acute.  Patient presented with relatively new ulcer of the left foot with complaints of severe foot pain. He was initially tachycardic and labs revealed WBC  22.6.  Lactic acids was reassuring at 1.1.  Physical exam revealed erythema at the former side of the foot.  X-rays of the left foot revealed focal soft tissue gas in the superficial subcutaneous tissues over the resected stump without signs of osteomyelitis.  With signs of gas formation question of possibility of necrotizing fasciitis.  Records note last ABI the bilateral lower extremities from 06/2018 on the right was 1.15 and left 1.07 which signifies no significant signs of arterial disease. -Admit to MedSurg bed -N.p.o. for possible need of procedure -Follow-up blood cultures -Check ESR, CRP, prealbumin, and hemoglobin A1c -Continue empiric antibiotics vancomycin, cefepime, and metronidazole -Wound care consult -Transitions of care consult -Dr. Amalia Hailey of [facility] consulted, we will follow-up for any further recommendations  Hyponatremia: Acute on chronic.  On admission sodium level 128, but was last noted to be 134 on 03/15/2020.   Suspecting hypovolemic hyponatremia given patient on diuretic, but also could be related to patient being on a SSRI. -Continue lactated Ringer's at 150 mL/h -Continue monitoring sodium levels  Diabetes mellitus type 2 with diabetic neuropathy: Chronic.  On admission glucose normal limits at 105.  Previously well controlled as last hemoglobin A1c on file was 6.6 back in 08/2018.  Home regimen includes Toujeo 70 units twice daily and Synjardy 10-999 mg. -Hypoglycemic protocol -Check hemoglobin A1c -Hold Synjardy -Hold Toujeo while n.p.o. for possible procedure. -Continue gabapentin -CBGs before every meal with moderate SSI  Suspected acute kidney injury: Creatinine 1.47 with BUN 24, but baseline appears to have previously ranged from 0.8-0.9.  However, last available creatinine on file was 1.2 from 03/15/2020.  His baseline creatinine is actually less than 1 this would signify an acute kidney injury creatinine elevated above 0.3. -IV fluids as noted above -Hold nephrotoxic agents -Recheck kidney function in a.m.  Metabolic acidosis with elevated anion gap: Acute.  Labs have revealed CO2 20 with just mildly elevated anion gap.  Glucose was not noted to be significantly elevated and lactic acid was within normal limits. -Continue to monitor  Essential hypertension: Blood pressures currently stable.  Home blood pressure medications include lisinopril-hydrochlorothiazide 20-12.5 mg 1 tablet twice daily and amlodipine 10 mg daily. -Held lisinopril-hydrochlorothiazide due to hyponatremia and AKI -Continue amlodipine 10 mg daily  Hypoalbuminemia: Acute.  On admission albumin level just mildly low at 3.3. -Follow-up prealbumin  Anxiety: Home medications include sertraline 150 mg daily and clonazepam 1 mg nightly. -Continue home regimen  Hyperlipidemia: Home medications include atorvastatin 10 mg daily. -Continue statin  Tobacco abuse: Patient reports that he is trying to cut back and tries to  smoke less than a pack cigarettes per day on average.  There was note of some mild wheezing on physical exam, but patient states that he always wheezes.  Suspect some mild bronchitis -Nicotine patch offered  Obesity: BMI 31.19 kg/m. -Will need continued education on need of weight loss with diet and exercise in the outpatient setting  DVT prophylaxis: Lovenox   Code Status: Full Family Communication:  Patient declined any communication to family at this time.  Stated that he would update them himself. Disposition Plan: Possibly discharge home once medically stable Consults called: [facility] Admission status: Inpatient   Norval Morton MD [facility] Pager [phone]   If 7PM-7AM, please contact night-coverage [email or web address] Password [facility]  10/10/2020, 7:19 AM

## 2020-10-10 NOTE — ED Provider Notes (Signed)
Emergency Department Provider Note   I have reviewed the triage vital signs and the nursing notes.   HISTORY  Chief Complaint Foot Pain   HPI Charles Thompson is a 59 y.o. male with past medical history reviewed below presents to the emergency department with severe left foot pain and redness over the past several days.  He denies any rapid progression or redness of the foot.  [facility] and has had prior amputations.  He tells me that he had an old prescription for antibiotic and began taking this 2 days ago with no improvement in symptoms.  Denies any known fevers or shaking chills.  No pain into the calf or upper leg.  No rapid swelling of redness into the calf or leg.  Pain is concentrated mainly on the bottom of the foot and heel area. No other infection symptoms. No radiation of symptoms or modifying factors.    Past Medical History:  Diagnosis Date  . Anxiety   . Arthritis    neck  . Chronic ulcer of right foot (HCC)   . Diabetic peripheral neuropathy (HCC)   . First degree heart block   . GERD (gastroesophageal reflux disease)   . History of osteomyelitis    bilateral feet  . Hyperlipidemia   . Hypertension    followed by pcp  (03-07-2020 per pt never had a stress test)  . MDD (major depressive disorder)   . Type 2 diabetes mellitus with insulin therapy (HCC)    followed by pcp   (03-07-2020  pt stated checks blood sugar twice dialy,  fasting sugar--- 100-160)    Patient Active Problem List   Diagnosis Date Noted  . Streptococcal bacteremia   . Cellulitis 10/10/2020  . Hyponatremia 10/10/2020  . Obesity (BMI 30.0-34.9) 10/10/2020  . Hypoalbuminemia 10/10/2020  . Sepsis (HCC) 10/10/2020  . AKI (acute kidney injury) (HCC) 10/10/2020  . Insomnia secondary to restless leg syndrome 07/31/2020  . Right lumbar radiculopathy 06/20/2019  . Major depressive disorder, recurrent episode, in remission with seasonal pattern (HCC) 02/15/2019  .  History of transmetatarsal amputation of left foot (HCC) 10/19/2018  . Osteomyelitis of second toe of right foot (HCC)   . Diabetic foot infection (HCC) 06/20/2018  . Diabetic ulcer of left foot associated with type 2 diabetes mellitus (HCC)   . Tobacco abuse 12/02/2017  . Status post amputation of right great toe (HCC) 10/29/2016  . Type 2 diabetes mellitus with diabetic neuropathy, with Jhalen Eley-term current use of insulin (HCC) 12/05/2015  . Skin ulcer of left foot with fat layer exposed (HCC) 10/31/2015    Past Surgical History:  Procedure Laterality Date  . ALLOGRAFT APPLICATION Right 03/19/2020   Procedure: APPLICATION OF INTEGRA;  Surgeon: Park Liter, DPM;  Location: [facility];  Service: Podiatry;  Laterality: Right;  . AMPUTATION Left 06/22/2018   Procedure: LEFT FOOT FIRST RAY AMPUTATION;  Surgeon: Nadara Mustard, MD;  Location: [facility] OR;  Service: Orthopedics;  Laterality: Left;  . AMPUTATION Left 09/09/2018   Procedure: Left Transmetatarsal Amputation;  Surgeon: Nadara Mustard, MD;  Location: [facility] OR;  Service: Orthopedics;  Laterality: Left;  . EYE SURGERY    . I & D EXTREMITY Left 10/13/2020   Procedure: IRRIGATION AND DEBRIDEMENT FOOT;  Surgeon: [name], DPM;  Location: [facility] OR;  Service: Podiatry;  Laterality: Left;  . I & D EXTREMITY Left 10/10/2020   Procedure: IRRIGATION AND DEBRIDEMENT OF FOOT;  Surgeon:  [name], DPM;  Location: [facility] OR;  Service: Podiatry;  Laterality: Left;  . TENDON LENGTHENING Right 03/19/2020   Procedure: TENDOACHILLES LENGTHENING;  Surgeon: Park Liter, DPM;  Location: [facility];  Service: Podiatry;  Laterality: Right;  . TOE AMPUTATION Right 12-11-2015  @[facility]    great toe  . WOUND DEBRIDEMENT Right 03/19/2020   Procedure: DEBRIDEMENT WOUND;  Surgeon: 05/19/2020, DPM;  Location: [facility];  Service: Podiatry;  Laterality: Right;    Allergies Patient has no known allergies.  Family  History  Problem Relation Age of Onset  . Diabetes Other     Social History Social History   Tobacco Use  . Smoking status: Current Every Day Smoker    Packs/day: 0.75    Types: Cigarettes  . Smokeless tobacco: Never Used  Vaping Use  . Vaping Use: Never used  Substance Use Topics  . Alcohol use: Yes    Alcohol/week: 0.0 standard drinks    Comment: occ  . Drug use: Not Currently    Types: Marijuana    Comment: "not lately"    Review of Systems  Constitutional: No fever/chills Eyes: No visual changes. ENT: No sore throat. Cardiovascular: Denies chest pain. Respiratory: Denies shortness of breath. Gastrointestinal: No abdominal pain.  No nausea, no vomiting.  No diarrhea.  No constipation. Genitourinary: Negative for dysuria. Musculoskeletal: Negative for back pain. Skin: Left foot/heel redness and pain.  Neurological: Negative for headaches, focal weakness or numbness.  10-point ROS otherwise negative.  ____________________________________________   PHYSICAL EXAM:  VITAL SIGNS: ED Triage Vitals  Enc Vitals Group     BP 10/10/20 0217 (!) 111/53     Pulse Rate 10/10/20 0217 (!) 104     Resp 10/10/20 0217 18     Temp 10/10/20 0217 98.2 F (36.8 C)     Temp Source 10/10/20 0217 Oral     SpO2 10/10/20 0217 100 %   Constitutional: Alert and oriented. Well appearing and in no acute distress. Eyes: Conjunctivae are normal.  Head: Atraumatic. Nose: No congestion/rhinnorhea. Mouth/Throat: Mucous membranes are moist.   Neck: No stridor.  Cardiovascular: Tachycardia. Good peripheral circulation. Grossly normal heart sounds.   Respiratory: Normal respiratory effort. No retractions. Lungs CTAB. Gastrointestinal: Soft and nontender. No distention.  Musculoskeletal: Tenderness over the left heel with erythema. Focal area of skin thickening over the heel. No fluctuance. No ulceration. Chronic appearing ulceration to the forefoot near the amputation. No active discharge  or focal tenderness.  Neurologic:  Normal speech and language. No gross focal neurologic deficits are appreciated.  Skin:  Skin is warm and dry. Skin changes as described above.   ____________________________________________   LABS (all labs ordered are listed, but only abnormal results are displayed)  Labs Reviewed  CULTURE, BLOOD (ROUTINE X 2) - Abnormal; Notable for the following components:      Result Value   Culture   (*)    Value: STAPHYLOCOCCUS EPIDERMIDIS THE SIGNIFICANCE OF ISOLATING THIS ORGANISM FROM A SINGLE SET OF BLOOD CULTURES WHEN MULTIPLE SETS ARE DRAWN IS UNCERTAIN. PLEASE NOTIFY THE MICROBIOLOGY DEPARTMENT WITHIN ONE WEEK IF SPECIATION AND SENSITIVITIES ARE REQUIRED. Performed at [facility], [address] [place], [place]    All other components within normal limits  CULTURE, BLOOD (ROUTINE X 2) - Abnormal; Notable for the following components:   Culture STREPTOCOCCUS INTERMEDIUS (*)    All other components within normal limits  SURGICAL PCR SCREEN - Abnormal; Notable for  the following components:   Staphylococcus aureus POSITIVE (*)    All other components within normal limits  BLOOD CULTURE ID PANEL (REFLEXED) - BCID2 - Abnormal; Notable for the following components:   Staphylococcus species DETECTED (*)    Staphylococcus epidermidis DETECTED (*)    All other components within normal limits  COMPREHENSIVE METABOLIC PANEL - Abnormal; Notable for the following components:   Sodium 128 (*)    Chloride 92 (*)    CO2 20 (*)    Glucose, Bld 105 (*)    BUN 24 (*)    Creatinine, Ser 1.47 (*)    Albumin 3.3 (*)    GFR, Estimated 54 (*)    Anion gap 16 (*)    All other components within normal limits  CBC WITH DIFFERENTIAL/PLATELET - Abnormal; Notable for the following components:   WBC 22.6 (*)    Neutro Abs 18.6 (*)    Monocytes Absolute 1.6 (*)    Abs Immature Granulocytes 0.19 (*)    All other components within normal limits  C-REACTIVE  PROTEIN - Abnormal; Notable for the following components:   CRP 26.6 (*)    All other components within normal limits  PREALBUMIN - Abnormal; Notable for the following components:   Prealbumin 5.5 (*)    All other components within normal limits  HEMOGLOBIN A1C - Abnormal; Notable for the following components:   Hgb A1c MFr Bld 5.9 (*)    All other components within normal limits  SEDIMENTATION RATE - Abnormal; Notable for the following components:   Sed Rate 67 (*)    All other components within normal limits  GLUCOSE, CAPILLARY - Abnormal; Notable for the following components:   Glucose-Capillary 107 (*)    All other components within normal limits  CBC - Abnormal; Notable for the following components:   WBC 23.3 (*)    Hemoglobin 12.9 (*)    All other components within normal limits  COMPREHENSIVE METABOLIC PANEL - Abnormal; Notable for the following components:   Sodium 134 (*)    CO2 16 (*)    Glucose, Bld 147 (*)    BUN 24 (*)    Creatinine, Ser 1.29 (*)    Albumin 2.8 (*)    Total Bilirubin 1.5 (*)    Anion gap 20 (*)    All other components within normal limits  GLUCOSE, CAPILLARY - Abnormal; Notable for the following components:   Glucose-Capillary 121 (*)    All other components within normal limits  GLUCOSE, CAPILLARY - Abnormal; Notable for the following components:   Glucose-Capillary 118 (*)    All other components within normal limits  GLUCOSE, CAPILLARY - Abnormal; Notable for the following components:   Glucose-Capillary 122 (*)    All other components within normal limits  GLUCOSE, CAPILLARY - Abnormal; Notable for the following components:   Glucose-Capillary 130 (*)    All other components within normal limits  GLUCOSE, CAPILLARY - Abnormal; Notable for the following components:   Glucose-Capillary 123 (*)    All other components within normal limits  GLUCOSE, CAPILLARY - Abnormal; Notable for the following components:   Glucose-Capillary 106 (*)     All other components within normal limits  GLUCOSE, CAPILLARY - Abnormal; Notable for the following components:   Glucose-Capillary 130 (*)    All other components within normal limits  RENAL FUNCTION PANEL - Abnormal; Notable for the following components:   CO2 19 (*)    Glucose, Bld 129 (*)    Phosphorus 2.2 (*)  Albumin 2.5 (*)    Anion gap 16 (*)    All other components within normal limits  CBC WITH DIFFERENTIAL/PLATELET - Abnormal; Notable for the following components:   WBC 17.4 (*)    RBC 4.07 (*)    Hemoglobin 11.7 (*)    HCT 35.4 (*)    Neutro Abs 14.3 (*)    Abs Immature Granulocytes 0.13 (*)    All other components within normal limits  GLUCOSE, CAPILLARY - Abnormal; Notable for the following components:   Glucose-Capillary 128 (*)    All other components within normal limits  GLUCOSE, CAPILLARY - Abnormal; Notable for the following components:   Glucose-Capillary 124 (*)    All other components within normal limits  GLUCOSE, CAPILLARY - Abnormal; Notable for the following components:   Glucose-Capillary 146 (*)    All other components within normal limits  GLUCOSE, CAPILLARY - Abnormal; Notable for the following components:   Glucose-Capillary 136 (*)    All other components within normal limits  CBC WITH DIFFERENTIAL/PLATELET - Abnormal; Notable for the following components:   WBC 11.8 (*)    RBC 4.02 (*)    Hemoglobin 11.7 (*)    HCT 34.8 (*)    Neutro Abs 9.2 (*)    Abs Immature Granulocytes 0.11 (*)    All other components within normal limits  GLUCOSE, CAPILLARY - Abnormal; Notable for the following components:   Glucose-Capillary 123 (*)    All other components within normal limits  RENAL FUNCTION PANEL - Abnormal; Notable for the following components:   CO2 20 (*)    Glucose, Bld 132 (*)    Calcium 8.4 (*)    Phosphorus 2.1 (*)    Albumin 2.3 (*)    All other components within normal limits  GLUCOSE, CAPILLARY - Abnormal; Notable for the  following components:   Glucose-Capillary 184 (*)    All other components within normal limits  GLUCOSE, CAPILLARY - Abnormal; Notable for the following components:   Glucose-Capillary 217 (*)    All other components within normal limits  GLUCOSE, CAPILLARY - Abnormal; Notable for the following components:   Glucose-Capillary 174 (*)    All other components within normal limits  CBC - Abnormal; Notable for the following components:   WBC 12.8 (*)    RBC 3.61 (*)    Hemoglobin 10.4 (*)    HCT 31.2 (*)    All other components within normal limits  GLUCOSE, CAPILLARY - Abnormal; Notable for the following components:   Glucose-Capillary 131 (*)    All other components within normal limits  RENAL FUNCTION PANEL - Abnormal; Notable for the following components:   CO2 21 (*)    Glucose, Bld 174 (*)    Calcium 8.1 (*)    Phosphorus 2.0 (*)    Albumin 2.2 (*)    All other components within normal limits  GLUCOSE, CAPILLARY - Abnormal; Notable for the following components:   Glucose-Capillary 126 (*)    All other components within normal limits  GLUCOSE, CAPILLARY - Abnormal; Notable for the following components:   Glucose-Capillary 146 (*)    All other components within normal limits  RESP PANEL BY RT-PCR (FLU A&B, COVID) [id number]  AEROBIC/ANAEROBIC CULTURE (SURGICAL/DEEP WOUND)  AEROBIC/ANAEROBIC CULTURE (SURGICAL/DEEP WOUND)  CULTURE, BLOOD (ROUTINE X 2)  CULTURE, BLOOD (ROUTINE X 2)  LACTIC ACID, PLASMA  LACTIC ACID, PLASMA  PROTIME-INR  APTT  HIV ANTIBODY (ROUTINE TESTING W REFLEX)  GLUCOSE, CAPILLARY  MAGNESIUM  MAGNESIUM  MAGNESIUM  GLUCOSE, CAPILLARY  CBG MONITORING, ED   ____________________________________________  RADIOLOGY  DG Foot Complete Left  Result Date: 10/10/2020 CLINICAL DATA:  Left foot pain since Friday. Diabetes. Redness and warmth on the bottom and side of the foot. EXAM: LEFT FOOT - COMPLETE 3+ VIEW COMPARISON:  10/04/2020 FINDINGS: Previous  amputations of the first ray at the tarsometatarsal level and of the second through fifth rays at the mid metatarsal level. Productive bone and callus formation demonstrated at the resection stumps. Bone cortex appears intact. No evidence of cortical erosion or bone destruction. No abnormal sclerosis. No focal bone lesion or bone destruction. Degenerative changes in the intertarsal joints. There is evidence of focal soft tissue gas in the superficial subcutaneous tissues over the resection stump at the level of the second metatarsal. This may represent focal cellulitis. Vascular calcifications are seen. IMPRESSION: Postoperative amputations. Focal soft tissue gas in the superficial subcutaneous tissues over the resection stump at the level of the second metatarsal, possibly indicating soft tissue infection. No evidence of osteomyelitis. Electronically Signed   By: Burman NievesWilliam  Stevens M.D.   On: 10/10/2020 02:49    ____________________________________________   PROCEDURES  Procedure(s) performed:   Procedures  CRITICAL CARE Performed by: Maia PlanJoshua G Mckenzi Buonomo Total critical care time: 35 minutes Critical care time was exclusive of separately billable procedures and treating other patients. Critical care was necessary to treat or prevent imminent or life-threatening deterioration. Critical care was time spent personally by me on the following activities: development of treatment plan with patient and/or surrogate as well as nursing, discussions with consultants, evaluation of patient's response to treatment, examination of patient, obtaining history from patient or surrogate, ordering and performing treatments and interventions, ordering and review of laboratory studies, ordering and review of radiographic studies, pulse oximetry and re-evaluation of patient's condition.  Alona BeneJoshua Jeneva Schweizer, MD Emergency Medicine  ____________________________________________   INITIAL IMPRESSION / ASSESSMENT AND PLAN / ED  COURSE  Pertinent labs & imaging results that were available during my care of the patient were reviewed by me and considered in my medical decision making (see chart for details).   Patient presents emergency department with left foot cellulitis.  His initial vital signs show tachycardia but no fever.  He is having severe pain mainly in the heel area.  There is no clear bony involvement on x-ray of the left foot.  There is a focal area of gas near a chronic appearing ulceration adjacent to his forefoot amputation.  This of his pain is in the heel area and no such gas is visualized there on x-ray.  The patient's exam is not consistent with necrotizing fasciitis or other rapidly progressive infection.  Patient does have significant leukocytosis despite being on antibiotics at home.  [facility].  Plan for IV antibiotics here and admission.   Discussed patient's case with [facility] to request admission. Patient and family (if present) updated with plan. [facility].  I reviewed all nursing notes, vitals, pertinent old records, EKGs, labs, imaging (as available).  ____________________________________________  FINAL CLINICAL IMPRESSION(S) / ED DIAGNOSES  Final diagnoses:  Cellulitis of left lower extremity     MEDICATIONS GIVEN DURING THIS VISIT:  Medications  lactated ringers infusion ( Intravenous Stopped 10/10/20 1452)  morphine 4 MG/ML injection 4 mg (4 mg Intravenous Given 10/10/20 0544)  ondansetron (ZOFRAN) injection 4 mg (4 mg Intravenous Given 10/10/20 0544)  ceFEPIme (MAXIPIME) 2 g in sodium chloride 0.9 % 100 mL IVPB (0  g Intravenous Stopped 10/10/20 0622)  metroNIDAZOLE (FLAGYL) IVPB 500 mg (0 mg Intravenous Stopped 10/10/20 0652)  vancomycin (VANCOREADY) IVPB 2000 mg/400 mL (0 mg Intravenous Stopped 10/10/20 0839)  morphine 4 MG/ML injection 4 mg (4 mg Intravenous Given 10/10/20 0635)  nicotine (NICODERM CQ - dosed in mg/24 hours) patch  21 mg (21 mg Transdermal Patch Removed 10/11/20 0609)  HYDROmorphone (DILAUDID) injection 4 mg (4 mg Intravenous Given 10/10/20 1011)  metroNIDAZOLE (FLAGYL) 5-0.79 MG/ML-% IVPB (  Override pull for Anesthesia 10/10/20 1435)  chlorhexidine (PERIDEX) 0.12 % solution (15 mLs  Given 10/13/20 0712)  sodium chloride 0.9 % bolus 500 mL (500 mLs Intravenous New Bag/Given 10/13/20 1502)  magnesium sulfate IVPB 2 g 50 mL (2 g Intravenous New Bag/Given 10/13/20 1748)  vancomycin (VANCOREADY) IVPB 2000 mg/400 mL (2,000 mg Intravenous New Bag/Given 10/13/20 2128)  potassium PHOSPHATE 20 mmol in dextrose 5 % 500 mL infusion (20 mmol Intravenous New Bag/Given 10/14/20 1250)     NEW OUTPATIENT MEDICATIONS STARTED DURING THIS VISIT:  Discharge Medication List as of 10/14/2020  4:33 PM    START taking these medications   Details  levofloxacin (LEVAQUIN) 750 MG tablet Take 1 tablet (750 mg total) by mouth daily for 10 days., Starting Mon 10/14/2020, Until Thu 10/24/2020, Normal    nicotine (NICODERM CQ - DOSED IN MG/24 HOURS) 21 mg/24hr patch Place 1 patch (21 mg total) onto the skin daily., Starting Tue 10/15/2020, OTC        Note:  This document was prepared using Dragon voice recognition software and may include unintentional dictation errors.  Alona Bene, MD, [facility] Emergency Medicine    Channel Papandrea, Arlyss Repress, MD 10/15/20 [phone]

## 2020-10-10 NOTE — Anesthesia Preprocedure Evaluation (Signed)
Anesthesia Evaluation  Patient identified by MRN, date of birth, ID bandGeneral Assessment Comment:sleepy  Reviewed: Allergy & Precautions, NPO status , Patient's Chart, lab work & pertinent test results  History of Anesthesia Complications Negative for: history of anesthetic complications  Airway Mallampati: III  TM Distance: >3 FB Neck ROM: Full    Dental  (+) Dental Advisory Given   Pulmonary neg shortness of breath, neg sleep apnea, neg COPD, neg recent URI, Current Smoker and Patient abstained from smoking.,  Covid-19 Nucleic Acid Test Results Lab Results      Component                Value               Date                      SARSCOV2NAA              NEGATIVE            10/10/2020                SARSCOV2NAA              NEGATIVE            03/15/2020                SARSCOV2NAA              NEGATIVE            03/09/2020              breath sounds clear to auscultation       Cardiovascular hypertension, Pt. on medications  Rhythm:Regular     Neuro/Psych PSYCHIATRIC DISORDERS Anxiety Depression  Neuromuscular disease    GI/Hepatic Neg liver ROS, GERD  Medicated and Controlled,  Endo/Other  diabetes, Insulin Dependent  Renal/GU Renal diseaseLab Results      Component                Value               Date                      CREATININE               1.47 (H)            10/10/2020           Lab Results      Component                Value               Date                      K                        3.5                 10/10/2020                Musculoskeletal  (+) Arthritis ,   Abdominal   Peds  Hematology negative hematology ROS (+) Lab Results      Component                Value               Date  WBC                      22.6 (H)            10/10/2020                HGB                      14.0                10/10/2020                HCT                      41.6                 10/10/2020                MCV                      86.8                10/10/2020                PLT                      375                 10/10/2020              Anesthesia Other Findings   Reproductive/Obstetrics                             Anesthesia Physical Anesthesia Plan  ASA: III  Anesthesia Plan: General   Post-op Pain Management:    Induction: Intravenous  PONV Risk Score and Plan: 1 and Ondansetron and Treatment may vary due to age or medical condition  Airway Management Planned: LMA  Additional Equipment: None  Intra-op Plan:   Post-operative Plan: Extubation in OR  Informed Consent: I have reviewed the patients History and Physical, chart, labs and discussed the procedure including the risks, benefits and alternatives for the proposed anesthesia with the patient or authorized representative who has indicated his/her understanding and acceptance.     Dental advisory given  Plan Discussed with: CRNA and Surgeon  Anesthesia Plan Comments:         Anesthesia Quick Evaluation

## 2020-10-10 NOTE — Consult Note (Signed)
° ° °PODIATRY CONSULTATION ° °Charles Thompson  °MRN:[id number]  °DOB: 12/07/1960  °DOA: 10/10/2020 ° °CC: LT foot cellulitis/abscess ° °HPI: 59 y.o. male PMHx diabetes mellitus type 2 with peripheral neuropathy, s/p amputations, hypertension, hyperlipidemia, anxiety, depression, and tobacco abuse presents with complaints of severe left foot pain.  Patient admitted today 10/10/2020 podiatry was consulted.  Patient has been following Dr. Price, podiatry, outpatient and was last seen in the office 10/04/2020.  Since the last visit the patient developed severe pain with redness and swelling to his left foot heel and presented to the emergency department. ° °Past Medical History:  °Diagnosis Date  °• Anxiety   °• Arthritis   ° neck  °• Chronic ulcer of right foot (HCC)   °• Diabetic peripheral neuropathy (HCC)   °• First degree heart block   °• GERD (gastroesophageal reflux disease)   °• History of osteomyelitis   ° bilateral feet  °• Hyperlipidemia   °• Hypertension   ° followed by pcp  (03-07-2020 per pt never had a stress test)  °• MDD (major depressive disorder)   °• Type 2 diabetes mellitus with insulin therapy (HCC)   ° followed by pcp   (03-07-2020  pt stated checks blood sugar twice dialy,  fasting sugar--- 100-160)  ° °CBC Latest Ref Rng & Units 10/10/2020 10/03/2018 09/09/2018  °WBC 4.0 - 10.5 K/uL 22.6(H) 7.8 11.9(H)  °Hemoglobin 13.0 - 17.0 g/dL 14.0 16.9 15.7  °Hematocrit 39 - 52 % 41.6 49.8 47.2  °Platelets 150 - 400 K/uL 375 238 335  ° °BMP Latest Ref Rng & Units 10/10/2020 03/15/2020 10/03/2018  °Glucose 70 - 99 mg/dL 105(H) 201(H) 267(H)  °BUN 6 - 20 mg/dL 24(H) 18 13  °Creatinine 0.61 - 1.24 mg/dL 1.47(H) 1.20 0.91  °Sodium 135 - 145 mmol/L 128(L) 134(L) 137  °Potassium 3.5 - 5.1 mmol/L 3.5 4.4 4.0  °Chloride 98 - 111 mmol/L 92(L) 98 102  °CO2 22 - 32 mmol/L 20(L) 25 23  °Calcium 8.9 - 10.3 mg/dL 9.0 9.5 9.5  ° ° ° °  ° ° °Physical Exam: °General: The patient is alert and oriented x3 in no acute  distress. ° °Dermatology: Skin is warm and erythematous.  Ulcer noted to the distal amputation stump overlying the second metatarsal.  The wound does not probe to bone.  Wound base is a mix of granular and fibrotic tissue.  Periwound is callused. ° °There is also preulcerative calluses to the weightbearing surface of the heel and the lateral aspect of the forefoot. ° °The area of most concern is a small area along the medial longitudinal arch of the foot which appears to be purulence which is trying to surface.  It is very erythematous and swollen.  Skin is tight.  Suspect deep tissue abscess ° °Vascular: Skin is very warm to touch.  Edema and erythema noted.  ° °Neurological: Epicritic and protective threshold absent bilaterally.  ° °Musculoskeletal Exam: History of transmetatarsal amputation left ° °Radiographic Exam taken today 10/10/2020:  °Postoperative amputations. Focal soft tissue gas in the superficial °subcutaneous tissues over the resection stump at the level of the °second metatarsal, possibly indicating soft tissue infection. No °evidence of osteomyelitis. °  °Assessment/POC: °Diabetes mellitus type 2 suspect  ° °Sepsis secondary to cellulitis LT foot °-Continue empiric antibiotics as per admitting physician.  Vancomycin, cefepime, and metronidazole ° °Deep tissue abscess/cellulitis LT foot. Ulcer LT foot °-Based on clinical presentation I do believe the patient is developing purulence and abscess to the arch and   plantar heel of the foot.  I explained to the patient I think it would be in his best interest to go to the OR today for incision and drainage.  Patient agrees.   °-Surgical consent and preoperative orders placed.  Patient currently n.p.o. and states that he has not eaten for 2 days ° °Severe pain left foot °-verbal order upon arrival for stat 4mg dilaudid IV for breakthrough pain. Admitting hospitalist notified of verbal order and plan to take to OR for incision and drainage.  I do believe  that once the procedure is performed and the foot is decompressed and drained he should feel significant relief. ° °*Thank you for the consultation.  Please consult me for any questions or concerns.  [phone] ° °  °Stephnie Parlier M. Jaran Sainz, DPM °[facility] ° °Dr. Evgenia Merriman M. Jordis Repetto, DPM  °  °[address]                                        °[place], [place] [address]                °Office [phone]  °Fax [phone] ° ° ° °

## 2020-10-10 NOTE — Interval H&P Note (Signed)
History and Physical Interval Note:  10/10/2020 1:42 PM  Charles Thompson  has presented today for surgery, with the diagnosis of FOOT ABSCESS.  The various methods of treatment have been discussed with the patient and family. After consideration of risks, benefits and other options for treatment, the patient has consented to  Procedure(s): IRRIGATION AND DEBRIDEMENT OF FOOT (Left) as a surgical intervention.  The patient's history has been reviewed, patient examined, no change in status, stable for surgery.  I have reviewed the patient's chart and labs.  Questions were answered to the patient's satisfaction.     [name]

## 2020-10-10 NOTE — H&P (View-Only) (Signed)
PODIATRY CONSULTATION  Charles Thompson  YQM:[id number]  DOB: 1961-04-24  DOA: 10/10/2020  CC: LT foot cellulitis/abscess  HPI: 59 y.o. male PMHx diabetes mellitus type 2 with peripheral neuropathy, s/p amputations, hypertension, hyperlipidemia, anxiety, depression, and tobacco abuse presents with complaints of severe left foot pain.  Patient admitted today 10/10/2020 podiatry was consulted.  Patient has been following Dr. Samuella Cota, podiatry, outpatient and was last seen in the office 10/04/2020.  Since the last visit the patient developed severe pain with redness and swelling to his left foot heel and presented to the emergency department.  Past Medical History:  Diagnosis Date   Anxiety    Arthritis    neck   Chronic ulcer of right foot (HCC)    Diabetic peripheral neuropathy (HCC)    First degree heart block    GERD (gastroesophageal reflux disease)    History of osteomyelitis    bilateral feet   Hyperlipidemia    Hypertension    followed by pcp  (03-07-2020 per pt never had a stress test)   MDD (major depressive disorder)    Type 2 diabetes mellitus with insulin therapy (HCC)    followed by pcp   (03-07-2020  pt stated checks blood sugar twice dialy,  fasting sugar--- 100-160)   CBC Latest Ref Rng & Units 10/10/2020 10/03/2018 09/09/2018  WBC 4.0 - 10.5 K/uL 22.6(H) 7.8 11.9(H)  Hemoglobin 13.0 - 17.0 g/dL 52.8 41.3 24.4  Hematocrit 39 - 52 % 41.6 49.8 47.2  Platelets 150 - 400 K/uL 375 238 335   BMP Latest Ref Rng & Units 10/10/2020 03/15/2020 10/03/2018  Glucose 70 - 99 mg/dL 010(U) 725(D) 664(Q)  BUN 6 - 20 mg/dL 03(K) 18 13  Creatinine 0.61 - 1.24 mg/dL 7.42(V) 9.56 3.87  Sodium 135 - 145 mmol/L 128(L) 134(L) 137  Potassium 3.5 - 5.1 mmol/L 3.5 4.4 4.0  Chloride 98 - 111 mmol/L 92(L) 98 102  CO2 22 - 32 mmol/L 20(L) 25 23  Calcium 8.9 - 10.3 mg/dL 9.0 9.5 9.5         Physical Exam: General: The patient is alert and oriented x3 in no acute  distress.  Dermatology: Skin is warm and erythematous.  Ulcer noted to the distal amputation stump overlying the second metatarsal.  The wound does not probe to bone.  Wound base is a mix of granular and fibrotic tissue.  Periwound is callused.  There is also preulcerative calluses to the weightbearing surface of the heel and the lateral aspect of the forefoot.  The area of most concern is a small area along the medial longitudinal arch of the foot which appears to be purulence which is trying to surface.  It is very erythematous and swollen.  Skin is tight.  Suspect deep tissue abscess  Vascular: Skin is very warm to touch.  Edema and erythema noted.   Neurological: Epicritic and protective threshold absent bilaterally.   Musculoskeletal Exam: History of transmetatarsal amputation left  Radiographic Exam taken today 10/10/2020:  Postoperative amputations. Focal soft tissue gas in the superficial subcutaneous tissues over the resection stump at the level of the second metatarsal, possibly indicating soft tissue infection. No evidence of osteomyelitis.  Assessment/POC: Diabetes mellitus type 2 suspect   Sepsis secondary to cellulitis LT foot -Continue empiric antibiotics as per admitting physician.  Vancomycin, cefepime, and metronidazole  Deep tissue abscess/cellulitis LT foot. Ulcer LT foot -Based on clinical presentation I do believe the patient is developing purulence and abscess to the arch and  plantar heel of the foot.  I explained to the patient I think it would be in his best interest to go to the OR today for incision and drainage.  Patient agrees.   -Surgical consent and preoperative orders placed.  Patient currently n.p.o. and states that he has not eaten for 2 days  Severe pain left foot -verbal order upon arrival for stat 4mg  dilaudid IV for breakthrough pain. Admitting hospitalist notified of verbal order and plan to take to OR for incision and drainage.  I do believe  that once the procedure is performed and the foot is decompressed and drained he should feel significant relief.  *Thank you for the consultation.  Please consult me for any questions or concerns.     [phone], DPM [facility]  Dr. [name], DPM    [address] [place], [place]                Office [phone]  Fax [phone]

## 2020-10-10 NOTE — Consult Note (Addendum)
WOC Nurse Consult Note: Patient receiving care in [facility] [phone]. Reason for Consult: lower extremity wound Wound type: chronic, non-healing wound to left forefoot amputation site Pressure Injury POA: Yes/No/NA Measurement: 1.2 cm x 1.8 cm x 0.3 cm Wound bed: slick, pink Drainage (amount, consistency, odor) scant serous Periwound: thickened, hardened tissue Dressing procedure/placement/frequency:  Cleanse left forefoot ulcer at amputation site with saline, pat dry. Place a small piece of Aquacel [place] [phone]) down into the wound, cover with a bandage.  I Secure Chatted with Dr. Madelyn Flavors and verified that Dr. Logan Bores from the [facility] where the patient received care last Friday, will be seeing the patient today for the left foot issues.  The left heel area is edematous, the foot is erythematous along the plantar surface and extending up the achilles area, and the patient is having severe pain. The patient states that the person he saw at the foot and ankle clinic did a lot of shaving of thickened tissue, and after the left heel was shaved, the redness and pain began.  Monitor the wound area(s) for worsening of condition such as: Signs/symptoms of infection,  Increase in size,  Development of or worsening of odor, Development of pain, or increased pain at the affected locations.  Notify the medical team if any of these develop.  Thank you for the consult.  Discussed plan of care with the patient.  WOC nurse will not follow at this time.  Please re-consult the WOC team if needed.  Helmut Muster, RN, MSN, CWOCN, CNS-BC, pager [phone]

## 2020-10-10 NOTE — Transfer of Care (Signed)
Immediate Anesthesia Transfer of Care Note  Patient: Charles Thompson  Procedure(s) Performed: IRRIGATION AND DEBRIDEMENT OF FOOT (Left Foot)  Patient Location: PACU  Anesthesia Type:General  Level of Consciousness: awake, alert  and oriented  Airway & Oxygen Therapy: Patient Spontanous Breathing and Patient connected to face mask oxygen  Post-op Assessment: Report given to RN and Post -op Vital signs reviewed and stable  Post vital signs: Reviewed and stable  Last Vitals:  Vitals Value Taken Time  BP 95/64 10/10/20 1511  Temp 36.7 C 10/10/20 1510  Pulse 106 10/10/20 1514  Resp 10 10/10/20 1514  SpO2 96 % 10/10/20 1514  Vitals shown include unvalidated device data.  Last Pain:  Vitals:   10/10/20 1510  TempSrc:   PainSc: Asleep      Patients Stated Pain Goal: 3 (10/10/20 1000)  Complications: No complications documented.

## 2020-10-10 NOTE — ED Notes (Signed)
Hand off report given to Annabelle Harman, Rn at [facility].

## 2020-10-10 NOTE — Brief Op Note (Signed)
10/10/2020  3:02 PM  PATIENT:  Charles Thompson  59 y.o. male  PRE-OPERATIVE DIAGNOSIS:  FOOT ABSCESS  POST-OPERATIVE DIAGNOSIS:  FOOT ABSCESS  PROCEDURE:  Procedure(s): IRRIGATION AND DEBRIDEMENT OF FOOT (Left)  SURGEON:  Surgeon(s) and Role:    [name], DPM - Primary  PHYSICIAN ASSISTANT:   ASSISTANTS: none   ANESTHESIA:   general  EBL:    BLOOD ADMINISTERED:none  DRAINS: none   LOCAL MEDICATIONS USED:  MARCAINE     SPECIMEN:  Source of Specimen:  culture left foot  DISPOSITION OF SPECIMEN:  PATHOLOGY  COUNTS:  YES  TOURNIQUET:   Total Tourniquet Time Documented: Calf (Left) - 36 minutes Total: Calf (Left) - 36 minutes   DICTATION: .Reubin Milan Dictation  PLAN OF CARE: Admit to inpatient   PATIENT DISPOSITION:  PACU - hemodynamically stable.   Delay start of Pharmacological VTE agent (>24hrs) due to surgical blood loss or risk of bleeding: not applicable

## 2020-10-10 NOTE — Progress Notes (Signed)
Pharmacy Antibiotic Note  Charles Thompson is a 59 y.o. male admitted on 10/10/2020 with cellulitis.  Pharmacy has been consulted for vancomycin and cefepime dosing.  Plan: Cefepime 2gm IV q8 hours Vancomycin 2gm IV x 1 then 750mg  IV q12 F/u renal function, cultures and clinical course  Height: 6' (182.9 cm) Weight: 104.3 kg (230 lb) IBW/kg (Calculated) : 77.6  Temp (24hrs), Avg:98.2 F (36.8 C), Min:98.2 F (36.8 C), Max:98.2 F (36.8 C)  Recent Labs  Lab 10/10/20 0244  WBC 22.6*  CREATININE 1.47*  LATICACIDVEN 1.1    Estimated Creatinine Clearance: 66.7 mL/min (A) (by C-G formula based on SCr of 1.47 mg/dL (H)).    No Known Allergies  Thank you for allowing pharmacy to be a part of this patient's care.  10/12/20 [place] 10/10/2020 5:46 AM

## 2020-10-10 NOTE — Anesthesia Procedure Notes (Signed)
Procedure Name: LMA Insertion Date/Time: 10/10/2020 2:10 PM Performed by: Tressia Miners, CRNA Pre-anesthesia Checklist: Patient identified, Emergency Drugs available, Suction available and Patient being monitored Patient Re-evaluated:Patient Re-evaluated prior to induction Oxygen Delivery Method: Circle System Utilized Preoxygenation: Pre-oxygenation with 100% oxygen Induction Type: IV induction Ventilation: Mask ventilation without difficulty LMA: LMA inserted LMA Size: 5.0 Number of attempts: 1 Airway Equipment and Method: Bite block Placement Confirmation: positive ETCO2 Tube secured with: Tape Dental Injury: Teeth and Oropharynx as per pre-operative assessment

## 2020-10-11 DIAGNOSIS — F17200 Nicotine dependence, unspecified, uncomplicated: Secondary | ICD-10-CM

## 2020-10-11 DIAGNOSIS — E8809 Other disorders of plasma-protein metabolism, not elsewhere classified: Secondary | ICD-10-CM | POA: Diagnosis not present

## 2020-10-11 DIAGNOSIS — G4700 Insomnia, unspecified: Secondary | ICD-10-CM

## 2020-10-11 DIAGNOSIS — A419 Sepsis, unspecified organism: Secondary | ICD-10-CM | POA: Diagnosis not present

## 2020-10-11 DIAGNOSIS — F419 Anxiety disorder, unspecified: Secondary | ICD-10-CM

## 2020-10-11 DIAGNOSIS — E11621 Type 2 diabetes mellitus with foot ulcer: Secondary | ICD-10-CM | POA: Diagnosis not present

## 2020-10-11 DIAGNOSIS — L03116 Cellulitis of left lower limb: Secondary | ICD-10-CM | POA: Diagnosis not present

## 2020-10-11 DIAGNOSIS — G629 Polyneuropathy, unspecified: Secondary | ICD-10-CM

## 2020-10-11 DIAGNOSIS — E872 Acidosis: Secondary | ICD-10-CM

## 2020-10-11 LAB — CBC
HCT: 39.2 % (ref 39.0–52.0)
Hemoglobin: 12.9 g/dL — ABNORMAL LOW (ref 13.0–17.0)
MCH: 29 pg (ref 26.0–34.0)
MCHC: 32.9 g/dL (ref 30.0–36.0)
MCV: 88.1 fL (ref 80.0–100.0)
Platelets: 352 10*3/uL (ref 150–400)
RBC: 4.45 MIL/uL (ref 4.22–5.81)
RDW: 12.7 % (ref 11.5–15.5)
WBC: 23.3 10*3/uL — ABNORMAL HIGH (ref 4.0–10.5)
nRBC: 0 % (ref 0.0–0.2)

## 2020-10-11 LAB — COMPREHENSIVE METABOLIC PANEL
ALT: 20 U/L (ref 0–44)
AST: 17 U/L (ref 15–41)
Albumin: 2.8 g/dL — ABNORMAL LOW (ref 3.5–5.0)
Alkaline Phosphatase: 119 U/L (ref 38–126)
Anion gap: 20 — ABNORMAL HIGH (ref 5–15)
BUN: 24 mg/dL — ABNORMAL HIGH (ref 6–20)
CO2: 16 mmol/L — ABNORMAL LOW (ref 22–32)
Calcium: 8.9 mg/dL (ref 8.9–10.3)
Chloride: 98 mmol/L (ref 98–111)
Creatinine, Ser: 1.29 mg/dL — ABNORMAL HIGH (ref 0.61–1.24)
GFR, Estimated: >60 mL/min (ref >60)
Glucose, Bld: 147 mg/dL — ABNORMAL HIGH (ref 70–99)
Potassium: 4.1 mmol/L (ref 3.5–5.1)
Sodium: 134 mmol/L — ABNORMAL LOW (ref 135–145)
Total Bilirubin: 1.5 mg/dL — ABNORMAL HIGH (ref 0.3–1.2)
Total Protein: 6.6 g/dL (ref 6.5–8.1)

## 2020-10-11 LAB — GLUCOSE, CAPILLARY
Glucose-Capillary: 130 mg/dL — ABNORMAL HIGH (ref 70–99)
Glucose-Capillary: 123 mg/dL — ABNORMAL HIGH (ref 70–99)
Glucose-Capillary: 106 mg/dL — ABNORMAL HIGH (ref 70–99)
Glucose-Capillary: 130 mg/dL — ABNORMAL HIGH (ref 70–99)
Glucose-Capillary: 128 mg/dL — ABNORMAL HIGH (ref 70–99)

## 2020-10-11 MED ORDER — LISINOPRIL 20 MG PO TABS
20.0000 mg | ORAL_TABLET | Freq: Every day | ORAL | Status: DC
  Administered 2020-10-11 – 2020-10-13 (×3): 20 mg via ORAL
  Filled 2020-10-11 (×3): qty 1

## 2020-10-11 MED ORDER — ENOXAPARIN SODIUM 60 MG/0.6ML ~~LOC~~ SOLN
50.0000 mg | SUBCUTANEOUS | Status: DC
  Administered 2020-10-12 – 2020-10-14 (×2): 50 mg via SUBCUTANEOUS
  Filled 2020-10-11 (×2): qty 0.6

## 2020-10-11 MED ORDER — NICOTINE 21 MG/24HR TD PT24
21.0000 mg | MEDICATED_PATCH | Freq: Every day | TRANSDERMAL | Status: DC
  Administered 2020-10-11 – 2020-10-14 (×4): 21 mg via TRANSDERMAL
  Filled 2020-10-11 (×4): qty 1

## 2020-10-11 NOTE — Progress Notes (Signed)
   PODIATRY CONSULTATION  Charles Thompson ZJQ:[id number] DOB:05-30-1961 DOA:10/10/2020  CC: LT foot cellulitis/abscess  Patient status post incision and drainage left foot abscess.  DOS: 10/10/2020.  Patient is seen this morning with family at bedside.  Patient understandably agitated today due to not having nicotine patch, constant productive cough, and frustration with his infected foot.    CBC Latest Ref Rng & Units 10/11/2020 10/10/2020 10/03/2018  WBC 4.0 - 10.5 K/uL 23.3(H) 22.6(H) 7.8  Hemoglobin 13.0 - 17.0 g/dL 12.9(L) 14.0 16.9  Hematocrit 39 - 52 % 39.2 41.6 49.8  Platelets 150 - 400 K/uL 352 375 238   BMP Latest Ref Rng & Units 10/11/2020 10/10/2020 03/15/2020  Glucose 70 - 99 mg/dL 240(X) 735(H) 299(M)  BUN 6 - 20 mg/dL 42(A) 83(M) 18  Creatinine 0.61 - 1.24 mg/dL 1.96(Q) 2.29(N) 9.89  Sodium 135 - 145 mmol/L 134(L) 128(L) 134(L)  Potassium 3.5 - 5.1 mmol/L 4.1 3.5 4.4  Chloride 98 - 111 mmol/L 98 92(L) 98  CO2 22 - 32 mmol/L 16(L) 20(L) 25  Calcium 8.9 - 10.3 mg/dL 8.9 9.0 9.5      Physical Exam: Upon removal of the dressings, there is a moderate flow of sanguinous drainage.  There was not a lot of purulence within the incision and packing site today.  Foot appears to be somewhat improved in regards to the erythema extending up the leg.  Overall foot demonstrates some improvement.     Assessment/POC: Status post incision and drainage left foot abscess.  DOS: 10/10/2020 -Overall the foot does appear to be somewhat improved.  I did explain to the patient that he is high risk for limb loss due to the severe infection that was found in the foot yesterday during surgery.  Heavy purulence was found throughout the entire plantar structures of the foot.  Patient understands. -Dressings were changed today.  The foot was packed with saline wet-to-dry dressings.  Keep clean dry and intact.  Reinforce as needed.  Podiatry will change dressings -Cultures taken  intraoperatively pending -Continue IV antibiotics -The plan currently is to return to the OR for irrigation and debridement and delayed primary closure of the foot, tentatively expected for Sunday, 10/13/2020.  Hospitalist notified of the plan -Podiatry will continue to follow  *Please contact me directly with any questions or concerns. [phone]     [name], DPM [facility]  Dr. [name], DPM    [address] [place], [place] [address]                Office [phone]  Fax [phone]

## 2020-10-11 NOTE — Progress Notes (Signed)
PROGRESS NOTE  Charles Thompson QTM:[id number] DOB: [birth year]   PCP: Haywood Pao, MD  Patient is from: Home  DOA: 10/10/2020 LOS: 1  Chief complaints: Left foot.  Brief Narrative / Interim history: 59 year old male with history of DM-2, neuropathy s/p left TMT amputations, HTN, HLD, anxiety, depression and tobacco use disorder presenting with progressive left foot pain and ulcer with drainage, and admitted for sepsis due to left foot cellulitis and diabetic wound infection.    Left foot x-ray with focal soft tissue gas in the superficial subcutaneous tissues over the resection stump at the level of the second metatarsal but no evidence of osteomyelitis. He was a started on vancomycin, cefepime and Flagyl.  Podiatry consulted.    Patient underwent incision and drainage of left foot by Dr. Amalia Hailey on 10/10/2020.  Blood and surgical tissue cultures pending.  Subjective: Seen and examined earlier this morning.  No major events overnight or this morning.  He reports difficulty sleeping in the hospital and asking if he could be released.  However, he understands the importance of staying in the hospital as he is a still on broad-spectrum antibiotics while cultures pending.  Pain fairly controlled.  Denies chest pain, dyspnea, GI or UTI symptoms.  Objective: Vitals:   10/10/20 1645 10/10/20 1948 10/11/20 0154 10/11/20 0600  BP: 126/77 (!) 143/78 135/76 (!) 145/79  Pulse: (!) 102 (!) 108 98 89  Resp: 20 18    Temp: 98.4 F (36.9 C)   98.4 F (36.9 C)  TempSrc:    Oral  SpO2: 97% 98% 97% 94%  Weight:      Height:        Intake/Output Summary (Last 24 hours) at 10/11/2020 1334 Last data filed at 10/11/2020 0154 Gross per 24 hour  Intake 1620 ml  Output 1300 ml  Net 320 ml   Filed Weights   10/10/20 0523 10/10/20 1000  Weight: 104.3 kg 106.6 kg    Examination:  GENERAL: No apparent distress.  Nontoxic. HEENT: MMM.  Vision and hearing grossly intact.  NECK: Supple.  No  apparent JVD.  RESP: On room air.  No IWOB.  Fair aeration bilaterally. CVS:  RRR. Heart sounds normal.  ABD/GI/GU: BS+. Abd soft, NTND.  MSK/EXT:  Moves extremities.  S/p TMT and I&D of left foot. SKIN: Ace wrap and dressing over left foot DCI.  No erythema swelling proximally. NEURO: Awake, alert and oriented appropriately.  No apparent focal neuro deficit. PSYCH: Calm. Normal affect.      Procedures:  10/10/2020-incision and drainage of left foot diabetic wound and abscess  Microbiology summarized: COVID-19 and influenza PCR nonreactive. Blood culture pending. Surgical tissue culture pending.  Assessment & Plan: Sepsis due to purulent cellulitis and diabetic foot ulcer of left foot: POA.  Met criteria with tachycardia and leukocytosis.  Left foot x-ray with focal soft tissue gas in the superficial subcutaneous tissues over the resection stump at the level of the second metatarsal but no evidence of osteomyelitis.  Still with leukocytosis.  Normal ABI in 06/2018. -Status post I&D of left foot diabetic wound abscess -Per podiatry, plan for return to the OR on Sunday on Monday for I&D with delayed primary closure -Continue vancomycin, cefepime and Flagyl pending cultures. -Pain control with bowel regimen. -Will consider ID consult after culture results.  Hyponatremia: Na 128>> 134.  Improved. -Discontinue IV fluid  Controlled DM-2 with hyperglycemia, neuropathy and diabetic foot ulcer: A1c 5.9%.:  Toujeo 70 units twice daily and Synjardy 10-999 mg. Recent Labs  Lab 10/10/20 1951 10/10/20 2348 10/11/20 0430 10/11/20 0755 10/11/20 1158  GLUCAP 118* 122* 130* 123* 106*  -Continue current insulin regimen -Continue gabapentin and a statin  Anion gap metabolic acidosis: Unclear etiology.  Not in DKA.  No lactic acidosis.  No azotemia.  Due to sepsis? -Continue to monitor  Essential hypertension: Normotensive. -Continue amlodipine 10 mg daily -Resume home  lisinopril -Continue holding HCTZ  Anxiety: Home medications include sertraline 150 mg daily and clonazepam 1 mg nightly. -Continue home Zoloft and Klonopin  Hyperlipidemia:  -Continue statin  Tobacco use disorder: Reports smoking about a pack a day. -Encourage tobacco cessation -Nicotine patch  Insomnia: -Continue home Klonopin at night  Obesity/hypoalbuminemia Body mass index is 31.87 kg/m.         DVT prophylaxis:  SCDs Start: 10/10/20 1648  Code Status: Full code Family Communication: Patient and/or RN. Available if any question.  Status is: Inpatient  Remains inpatient appropriate because:Ongoing diagnostic testing needed not appropriate for outpatient work up, IV treatments appropriate due to intensity of illness or inability to take PO and Inpatient level of care appropriate due to severity of illness   Dispo: The patient is from: Home              Anticipated d/c is to: Home              Anticipated d/c date is: > 3 days              Patient currently is not medically stable to d/c.       Consultants:  Podiatry   Sch Meds:  Scheduled Meds: . amLODipine  10 mg Oral Daily  . aspirin EC  81 mg Oral Daily  . atorvastatin  10 mg Oral Daily  . Chlorhexidine Gluconate Cloth  6 each Topical Q0600  . clonazePAM  1 mg Oral QHS  . [START ON 10/12/2020] enoxaparin (LOVENOX) injection  50 mg Subcutaneous Q24H  . gabapentin  300 mg Oral BID  . insulin aspart  0-15 Units Subcutaneous TID WC  . lisinopril  20 mg Oral Daily  . metroNIDAZOLE  500 mg Oral Q8H  . mupirocin ointment  1 application Nasal BID  . nicotine  21 mg Transdermal Daily  . pantoprazole  40 mg Oral Daily  . sertraline  150 mg Oral Daily  . sodium chloride flush  3 mL Intravenous Q12H   Continuous Infusions: . sodium chloride    . ceFEPime (MAXIPIME) IV 2 g (10/11/20 0510)  . vancomycin 750 mg (10/11/20 0347)   PRN Meds:.sodium chloride, acetaminophen, albuterol, HYDROmorphone  (DILAUDID) injection, ondansetron **OR** ondansetron (ZOFRAN) IV, oxyCODONE-acetaminophen, promethazine, sodium chloride flush  Antimicrobials: Anti-infectives (From admission, onward)   Start     Dose/Rate Route Frequency Ordered Stop   10/10/20 1800  vancomycin (VANCOREADY) IVPB 750 mg/150 mL        750 mg 150 mL/hr over 60 Minutes Intravenous Every 12 hours 10/10/20 0546     10/10/20 1400  ceFEPIme (MAXIPIME) 2 g in sodium chloride 0.9 % 100 mL IVPB        2 g 200 mL/hr over 30 Minutes Intravenous Every 8 hours 10/10/20 0543     10/10/20 1358  metroNIDAZOLE (FLAGYL) 5-0.79 MG/ML-% IVPB       Note to Pharmacy: Reece Agar  : cabinet override      10/10/20 1358 10/10/20 1435   10/10/20 1330  metroNIDAZOLE (FLAGYL) tablet 500 mg        500 mg Oral  Every 8 hours 10/10/20 0737     10/10/20 0600  vancomycin (VANCOREADY) IVPB 2000 mg/400 mL        2,000 mg 200 mL/hr over 120 Minutes Intravenous  Once 10/10/20 0542 10/10/20 0839   10/10/20 0530  ceFEPIme (MAXIPIME) 2 g in sodium chloride 0.9 % 100 mL IVPB        2 g 200 mL/hr over 30 Minutes Intravenous  Once 10/10/20 0520 10/10/20 0622   10/10/20 0530  metroNIDAZOLE (FLAGYL) IVPB 500 mg        500 mg 100 mL/hr over 60 Minutes Intravenous  Once 10/10/20 0520 10/10/20 0652   10/10/20 0530  vancomycin (VANCOCIN) IVPB 1000 mg/200 mL premix  Status:  Discontinued        1,000 mg 200 mL/hr over 60 Minutes Intravenous  Once 10/10/20 0520 10/10/20 0542       I have personally reviewed the following labs and images: CBC: Recent Labs  Lab 10/10/20 0244 10/11/20 0227  WBC 22.6* 23.3*  NEUTROABS 18.6*  --   HGB 14.0 12.9*  HCT 41.6 39.2  MCV 86.8 88.1  PLT 375 352   BMP &GFR Recent Labs  Lab 10/10/20 0244 10/11/20 0227  NA 128* 134*  K 3.5 4.1  CL 92* 98  CO2 20* 16*  GLUCOSE 105* 147*  BUN 24* 24*  CREATININE 1.47* 1.29*  CALCIUM 9.0 8.9   Estimated Creatinine Clearance: 76.8 mL/min (A) (by C-G formula based on SCr  of 1.29 mg/dL (H)). Liver & Pancreas: Recent Labs  Lab 10/10/20 0244 10/11/20 0227  AST 17 17  ALT 23 20  ALKPHOS 112 119  BILITOT 1.2 1.5*  PROT 7.4 6.6  ALBUMIN 3.3* 2.8*   No results for input(s): LIPASE, AMYLASE in the last 168 hours. No results for input(s): AMMONIA in the last 168 hours. Diabetic: Recent Labs    10/10/20 0947  HGBA1C 5.9*   Recent Labs  Lab 10/10/20 1951 10/10/20 2348 10/11/20 0430 10/11/20 0755 10/11/20 1158  GLUCAP 118* 122* 130* 123* 106*   Cardiac Enzymes: No results for input(s): CKTOTAL, CKMB, CKMBINDEX, TROPONINI in the last 168 hours. No results for input(s): PROBNP in the last 8760 hours. Coagulation Profile: Recent Labs  Lab 10/10/20 0600  INR 1.1   Thyroid Function Tests: No results for input(s): TSH, T4TOTAL, FREET4, T3FREE, THYROIDAB in the last 72 hours. Lipid Profile: No results for input(s): CHOL, HDL, LDLCALC, TRIG, CHOLHDL, LDLDIRECT in the last 72 hours. Anemia Panel: No results for input(s): VITAMINB12, FOLATE, FERRITIN, TIBC, IRON, RETICCTPCT in the last 72 hours. Urine analysis:    Component Value Date/Time   COLORURINE YELLOW 12/18/2007 1926   APPEARANCEUR CLEAR 12/18/2007 1926   LABSPEC 1.040 (H) 12/18/2007 1926   PHURINE 6.5 12/18/2007 1926   GLUCOSEU >1000 (A) 12/18/2007 1926   HGBUR NEGATIVE 12/18/2007 1926   HGBUR NEGATIVE POINT OF CARE RESULT 12/18/2007 [place] NEGATIVE 12/18/2007 1926   KETONESUR 40 (A) 12/18/2007 1926   PROTEINUR NEGATIVE 12/18/2007 1926   UROBILINOGEN 1.0 12/18/2007 1926   NITRITE NEGATIVE 12/18/2007 1926   LEUKOCYTESUR NEGATIVE 12/18/2007 1926   Sepsis Labs: Invalid input(s): PROCALCITONIN, [place]  Microbiology: Recent Results (from the past 240 hour(s))  Resp Panel by RT-PCR (Flu A&B, Covid) Nasopharyngeal Swab     Status: None   Collection Time: 10/10/20  5:57 AM   Specimen: Nasopharyngeal Swab; Nasopharyngeal(NP) swabs in vial transport medium  Result Value  Ref Range Status   SARS Coronavirus 2 by RT  PCR NEGATIVE NEGATIVE Final    Comment: (NOTE) SARS-CoV-2 target nucleic acids are NOT DETECTED.  The SARS-CoV-2 RNA is generally detectable in upper respiratory specimens during the acute phase of infection. The lowest concentration of SARS-CoV-2 viral copies this assay can detect is 138 copies/mL. A negative result does not preclude SARS-Cov-2 infection and should not be used as the sole basis for treatment or other patient management decisions. A negative result may occur with  improper specimen collection/handling, submission of specimen other than nasopharyngeal swab, presence of viral mutation(s) within the areas targeted by this assay, and inadequate number of viral copies(<138 copies/mL). A negative result must be combined with clinical observations, patient history, and epidemiological information. The expected result is Negative.  Fact Sheet for Patients:  EntrepreneurPulse.com.au  Fact Sheet for Healthcare Providers:  IncredibleEmployment.be  This test is no t yet approved or cleared by the [place] FDA and  has been authorized for detection and/or diagnosis of SARS-CoV-2 by FDA under an Emergency Use Authorization (EUA). This EUA will remain  in effect (meaning this test can be used) for the duration of the COVID-19 declaration under Section 564(b)(1) of the Act, 21 U.S.C.section 360bbb-3(b)(1), unless the authorization is terminated  or revoked sooner.       Influenza A by PCR NEGATIVE NEGATIVE Final   Influenza B by PCR NEGATIVE NEGATIVE Final    Comment: (NOTE) The Xpert Xpress SARS-CoV-2/FLU/RSV plus assay is intended as an aid in the diagnosis of influenza from Nasopharyngeal swab specimens and should not be used as a sole basis for treatment. Nasal washings and aspirates are unacceptable for Xpert Xpress SARS-CoV-2/FLU/RSV testing.  Fact Sheet for  Patients: EntrepreneurPulse.com.au  Fact Sheet for Healthcare Providers: IncredibleEmployment.be  This test is not yet approved or cleared by the [place] FDA and has been authorized for detection and/or diagnosis of SARS-CoV-2 by FDA under an Emergency Use Authorization (EUA). This EUA will remain in effect (meaning this test can be used) for the duration of the COVID-19 declaration under Section 564(b)(1) of the Act, 21 U.S.C. section 360bbb-3(b)(1), unless the authorization is terminated or revoked.  Performed at [facility], [place] [address] [place], [place] [address]   Surgical pcr screen     Status: Abnormal   Collection Time: 10/10/20 11:11 AM   Specimen: Nasal Mucosa; Nasal Swab  Result Value Ref Range Status   MRSA, PCR NEGATIVE NEGATIVE Final   Staphylococcus aureus POSITIVE (A) NEGATIVE Final    Comment: (NOTE) The Xpert SA Assay (FDA approved for NASAL specimens in patients 97 years of age and older), is one component of a comprehensive surveillance program. It is not intended to diagnose infection nor to guide or monitor treatment. Performed at [facility], [place] [address], [place], [place] [address]   Aerobic/Anaerobic Culture (surgical/deep wound)     Status: None (Preliminary result)   Collection Time: 10/10/20  2:23 PM   Specimen: Abscess; Wound  Result Value Ref Range Status   Specimen Description ABSCESS LEFT FOOT  Final   Special Requests PT ON CEFEPIME  Final   Gram Stain PENDING  Incomplete   Culture   Final    CULTURE REINCUBATED FOR BETTER GROWTH Performed at [facility], [address], [place], [place] [address]    Report Status PENDING  Incomplete    Radiology Studies: No results found.    Omarii Scalzo T. [place]  If 7PM-7AM, please contact night-coverage [email or web address] 10/11/2020, 1:34 PM

## 2020-10-12 DIAGNOSIS — L03116 Cellulitis of left lower limb: Secondary | ICD-10-CM | POA: Diagnosis not present

## 2020-10-12 DIAGNOSIS — E8809 Other disorders of plasma-protein metabolism, not elsewhere classified: Secondary | ICD-10-CM | POA: Diagnosis not present

## 2020-10-12 DIAGNOSIS — E1140 Type 2 diabetes mellitus with diabetic neuropathy, unspecified: Secondary | ICD-10-CM | POA: Diagnosis not present

## 2020-10-12 DIAGNOSIS — E11621 Type 2 diabetes mellitus with foot ulcer: Secondary | ICD-10-CM | POA: Diagnosis not present

## 2020-10-12 DIAGNOSIS — Z720 Tobacco use: Secondary | ICD-10-CM | POA: Diagnosis not present

## 2020-10-12 DIAGNOSIS — A419 Sepsis, unspecified organism: Secondary | ICD-10-CM | POA: Diagnosis not present

## 2020-10-12 LAB — RENAL FUNCTION PANEL
Albumin: 2.5 g/dL — ABNORMAL LOW (ref 3.5–5.0)
Anion gap: 16 — ABNORMAL HIGH (ref 5–15)
BUN: 20 mg/dL (ref 6–20)
CO2: 19 mmol/L — ABNORMAL LOW (ref 22–32)
Calcium: 8.9 mg/dL (ref 8.9–10.3)
Chloride: 101 mmol/L (ref 98–111)
Creatinine, Ser: 1.19 mg/dL (ref 0.61–1.24)
GFR, Estimated: >60 mL/min (ref >60)
Glucose, Bld: 129 mg/dL — ABNORMAL HIGH (ref 70–99)
Phosphorus: 2.2 mg/dL — ABNORMAL LOW (ref 2.5–4.6)
Potassium: 4.3 mmol/L (ref 3.5–5.1)
Sodium: 136 mmol/L (ref 135–145)

## 2020-10-12 LAB — CBC WITH DIFFERENTIAL/PLATELET
Abs Immature Granulocytes: 0.13 10*3/uL — ABNORMAL HIGH (ref 0.00–0.07)
Basophils Absolute: 0.1 10*3/uL (ref 0.0–0.1)
Basophils Relative: 0 %
Eosinophils Absolute: 0.1 10*3/uL (ref 0.0–0.5)
Eosinophils Relative: 0 %
HCT: 35.4 % — ABNORMAL LOW (ref 39.0–52.0)
Hemoglobin: 11.7 g/dL — ABNORMAL LOW (ref 13.0–17.0)
Immature Granulocytes: 1 %
Lymphocytes Relative: 11 %
Lymphs Abs: 2 10*3/uL (ref 0.7–4.0)
MCH: 28.7 pg (ref 26.0–34.0)
MCHC: 33.1 g/dL (ref 30.0–36.0)
MCV: 87 fL (ref 80.0–100.0)
Monocytes Absolute: 0.9 10*3/uL (ref 0.1–1.0)
Monocytes Relative: 5 %
Neutro Abs: 14.3 10*3/uL — ABNORMAL HIGH (ref 1.7–7.7)
Neutrophils Relative %: 83 %
Platelets: 342 10*3/uL (ref 150–400)
RBC: 4.07 MIL/uL — ABNORMAL LOW (ref 4.22–5.81)
RDW: 12.8 % (ref 11.5–15.5)
WBC: 17.4 10*3/uL — ABNORMAL HIGH (ref 4.0–10.5)
nRBC: 0 % (ref 0.0–0.2)

## 2020-10-12 LAB — MAGNESIUM: Magnesium: 1.9 mg/dL (ref 1.7–2.4)

## 2020-10-12 LAB — GLUCOSE, CAPILLARY
Glucose-Capillary: 124 mg/dL — ABNORMAL HIGH (ref 70–99)
Glucose-Capillary: 146 mg/dL — ABNORMAL HIGH (ref 70–99)
Glucose-Capillary: 136 mg/dL — ABNORMAL HIGH (ref 70–99)
Glucose-Capillary: 123 mg/dL — ABNORMAL HIGH (ref 70–99)

## 2020-10-12 MED ORDER — SODIUM CHLORIDE 0.9 % IV SOLN
3.0000 g | Freq: Four times a day (QID) | INTRAVENOUS | Status: DC
  Administered 2020-10-12 – 2020-10-13 (×6): 3 g via INTRAVENOUS
  Filled 2020-10-12: qty 3
  Filled 2020-10-12: qty 8
  Filled 2020-10-12 (×2): qty 3
  Filled 2020-10-12: qty 8
  Filled 2020-10-12: qty 3
  Filled 2020-10-12: qty 8
  Filled 2020-10-12: qty 3
  Filled 2020-10-12: qty 8

## 2020-10-12 NOTE — H&P (View-Only) (Signed)
   PODIATRY CONSULTATION  Charles Thompson MRN:[id number] DOB:04/10/1961 DOA:10/10/2020  CC: LT foot cellulitis/abscess.   Patient status post incision and drainage left foot abscess.  DOS: 10/10/2020.  Patient is seen this morning with sister at bedside.  Patient feeling much better today.  He denies any productive cough.  He is resting comfortably in bed.  He notices significant improvement over the last 24 hours.  He states that he actually has an appetite today.   CBC Latest Ref Rng & Units 10/12/2020 10/11/2020 10/10/2020  WBC 4.0 - 10.5 K/uL 17.4(H) 23.3(H) 22.6(H)  Hemoglobin 13.0 - 17.0 g/dL 11.7(L) 12.9(L) 14.0  Hematocrit 39 - 52 % 35.4(L) 39.2 41.6  Platelets 150 - 400 K/uL 342 352 375   BMP Latest Ref Rng & Units 10/12/2020 10/11/2020 10/10/2020  Glucose 70 - 99 mg/dL 129(H) 147(H) 105(H)  BUN 6 - 20 mg/dL 20 24(H) 24(H)  Creatinine 0.61 - 1.24 mg/dL 1.19 1.29(H) 1.47(H)  Sodium 135 - 145 mmol/L 136 134(L) 128(L)  Potassium 3.5 - 5.1 mmol/L 4.3 4.1 3.5  Chloride 98 - 111 mmol/L 101 98 92(L)  CO2 22 - 32 mmol/L 19(L) 16(L) 20(L)  Calcium 8.9 - 10.3 mg/dL 8.9 8.9 9.0      Physical Exam: Upon removal of the dressings, there is a moderate flow of sanguinous drainage.  Packed gauze saturated with mild purulence and sanguinous drainage.  Foot appears stable.  There continues to be some erythema to the posterior heel..  Overall foot demonstrates some improvement.     Assessment/POC: Status post incision and drainage left foot abscess.  DOS: 10/10/2020 -Foot appears stable.  No significant change since last dressing changes  -Dressings were changed today.  The foot was packed with saline wet-to-dry dressings.  Keep clean dry and intact.  Reinforce as needed.  Podiatry will change dressings -Cultures taken intraoperatively pending -Continue IV antibiotics -Irrigation and debridement with delayed primary closure scheduled for tomorrow, 10/13/2020, 7:30 AM.   Preoperative orders placed -Podiatry will continue to follow  *Please contact me directly with any questions or concerns. [phone]     Alaia Lordi M. Laporshia Hogen, DPM [facility]  Dr. Eliyahu Bille M. Heily Carlucci, DPM    [address]                                        [place], [place] [address]                Office [phone]  Fax [phone]     

## 2020-10-12 NOTE — Consult Note (Addendum)
[facility]    Date of Admission:  10/10/2020     Current antibiotics: Day 3 vanco  Day 3 cefepime Day 3 metronidazole  Previous antibiotics: Doxy PTA  Reason for Consult:  Diabetic foot infection     Referring Physician: Dr Alanda Slim  ASSESSMENT:    # Diabetic foot infection s/p I&D left foot abscess 10/10/20: cultures with Strep intermedius # DM # Tobacco use # Leukocytosis  PLAN:    -- stop vanc, cefepime, flagyl -- start unasyn -- glycemic control wound care -- follow cultures and further OR planning -- smoking cessation   MEDICATIONS:    Scheduled Meds: . amLODipine  10 mg Oral Daily  . aspirin EC  81 mg Oral Daily  . atorvastatin  10 mg Oral Daily  . Chlorhexidine Gluconate Cloth  6 each Topical Q0600  . clonazePAM  1 mg Oral QHS  . enoxaparin (LOVENOX) injection  50 mg Subcutaneous Q24H  . gabapentin  300 mg Oral BID  . insulin aspart  0-15 Units Subcutaneous TID WC  . lisinopril  20 mg Oral Daily  . metroNIDAZOLE  500 mg Oral Q8H  . mupirocin ointment  1 application Nasal BID  . nicotine  21 mg Transdermal Daily  . pantoprazole  40 mg Oral Daily  . sertraline  150 mg Oral Daily  . sodium chloride flush  3 mL Intravenous Q12H    Continuous Infusions: . sodium chloride    . ceFEPime (MAXIPIME) IV 2 g (10/12/20 0523)  . vancomycin 750 mg (10/12/20 0524)    PRN Meds: sodium chloride, acetaminophen, albuterol, HYDROmorphone (DILAUDID) injection, ondansetron **OR** ondansetron (ZOFRAN) IV, oxyCODONE-acetaminophen, promethazine, sodium chloride flush  HPI:    Charles Thompson is a 59 y.o. male With past medical history of type 2 diabetes, neuropathy, prior left transmetatarsal amputation, hypertension, hyperlipidemia, anxiety/depression, and tobacco use who follows with Dr. Samuella Cota of the podiatry clinic and presented on November 25 with progressive left foot pain and ulcer drainage.  He was admitted for sepsis related to the  same.  Left foot x-ray showed focal soft tissue gas in the superficial subcutaneous tissue over the resection stump at the level of the second metatarsal but no evidence of osteomyelitis he was started on vancomycin, cefepime, Flagyl.  He went to the OR for incision and drainage with Dr. Logan Bores on November 25.  There is severe infection found in the OR with heavy purulence found throughout the entire plantar structures of the foot.  The plan is currently to return to the OR for I&D and delayed primary closure of the foot, tentatively tomorrow.  Currently he continues on vancomycin, cefepime, metronidazole.  He has been afebrile.  Leukocytosis is trending down.  Blood cultures from admission are negative, surgical cultures are growing strep intermedius.  Patient reports he was seen in the clinic by Dr. Samuella Cota on November 19 where he underwent an office debridement.  Shortly after this visit he began having pain in that foot.  He had several pills of antibiotics at home with doxycycline from a prior prescription that he began taking at that time.  Due to continued pain he presented to the ED as noted above.   Past Medical History:  Diagnosis Date  . Anxiety   . Arthritis    neck  . Chronic ulcer of right foot (HCC)   . Diabetic peripheral neuropathy (HCC)   . First degree heart block   . GERD (gastroesophageal reflux disease)   .  History of osteomyelitis    bilateral feet  . Hyperlipidemia   . Hypertension    followed by pcp  (03-07-2020 per pt never had a stress test)  . MDD (major depressive disorder)   . Type 2 diabetes mellitus with insulin therapy (HCC)    followed by pcp   (03-07-2020  pt stated checks blood sugar twice dialy,  fasting sugar--- 100-160)    Social History   Tobacco Use  . Smoking status: Current Every Day Smoker    Packs/day: 0.75    Types: Cigarettes  . Smokeless tobacco: Never Used  Vaping Use  . Vaping Use: Never used  Substance Use Topics  . Alcohol use: Yes     Alcohol/week: 0.0 standard drinks    Comment: occ  . Drug use: Not Currently    Types: Marijuana    Comment: "not lately"    Family History  Problem Relation Age of Onset  . Diabetes Other     No Known Allergies  Review of Systems  Constitutional: Negative for chills and fever.  HENT: Negative.   Respiratory: Negative.   Cardiovascular: Negative.   Gastrointestinal: Negative.   Genitourinary: Negative.   Musculoskeletal: Positive for joint pain.  Skin: Negative.   Neurological: Negative.   Psychiatric/Behavioral: Negative.     All other systems reviewed and are negative.  OBJECTIVE:   Blood pressure (!) 103/44, pulse 93, temperature 98 F (36.7 C), resp. rate 18, height 6' (1.829 m), weight 106.6 kg, SpO2 96 %. Body mass index is 31.87 kg/m.  Physical Exam Constitutional:      General: He is not in acute distress.    Appearance: Normal appearance.  HENT:     Head: Normocephalic and atraumatic.     Nose: Nose normal.  Eyes:     Extraocular Movements: Extraocular movements intact.     Conjunctiva/sclera: Conjunctivae normal.  Pulmonary:     Effort: Pulmonary effort is normal. No respiratory distress.  Abdominal:     General: Abdomen is flat.     Palpations: Abdomen is soft.  Musculoskeletal:     Comments: Left foot heavily wrapped in Ace bandage without strike through.   Skin:    General: Skin is warm and dry.  Neurological:     General: No focal deficit present.     Mental Status: He is alert and oriented to person, place, and time.  Psychiatric:        Mood and Affect: Mood normal.        Behavior: Behavior normal.     Lab Results & Microbiology Lab Results  Component Value Date   WBC 17.4 (H) 10/12/2020   HGB 11.7 (L) 10/12/2020   HCT 35.4 (L) 10/12/2020   MCV 87.0 10/12/2020   PLT 342 10/12/2020    Lab Results  Component Value Date   NA 136 10/12/2020   K 4.3 10/12/2020   CO2 19 (L) 10/12/2020   GLUCOSE 129 (H) 10/12/2020   BUN 20  10/12/2020   CREATININE 1.19 10/12/2020   CALCIUM 8.9 10/12/2020   GFRNONAA >60 10/12/2020   GFRAA >60 03/15/2020    Lab Results  Component Value Date   ALT 20 10/11/2020   AST 17 10/11/2020   ALKPHOS 119 10/11/2020   BILITOT 1.5 (H) 10/11/2020    C-Reactive Protein     Component Value Date/Time   CRP 26.6 (H) 10/10/2020 0947    Erythrocyte Sedimentation Rate     Component Value Date/Time   ESRSEDRATE 67 (H) 10/10/2020  7672      I have reviewed the micro and lab results in Epic.     Vedia Coffer for Infectious Disease [facility] [phone] pager 10/12/2020, 11:35 AM

## 2020-10-12 NOTE — Progress Notes (Signed)
Pharmacy Antibiotic Note  Charles Thompson is a 59 y.o. male admitted on 10/10/2020 with diabetic foot infection. Pharmacy has been consulted for Unasyn dosing.  Patient previously on empiric vancomycin, cefepime, and metronidazole. Underwent L foot abscess I&D on 11/25, and cultures growing S. Intermedius, pending sensitivities. Per ID, will deescalate to Unasyn. Will start Unasyn IV 3g q6h given renal function.  Plan: Discontinue vancomycin, cefepime and metronidazole Initiate Unasyn 3g q6h Follow-up renal function, cultures, clinical signs/symptoms of infection resolution  Height: 6' (182.9 cm) Weight: 106.6 kg (235 lb) IBW/kg (Calculated) : 77.6  Temp (24hrs), Avg:98.3 F (36.8 C), Min:98 F (36.7 C), Max:98.5 F (36.9 C)  Recent Labs  Lab 10/10/20 0244 10/10/20 0603 10/11/20 0227 10/12/20 0121  WBC 22.6*  --  23.3* 17.4*  CREATININE 1.47*  --  1.29* 1.19  LATICACIDVEN 1.1 1.1  --   --     Estimated Creatinine Clearance: 83.3 mL/min (by C-G formula based on SCr of 1.19 mg/dL).    No Known Allergies  Antimicrobials this admission: 11/25 Vanc>>11/27 11/25 Cefepime>>11/27 11/25 Flagyl>>11/27 11/27 Unasyn >>  Microbiology results: 11/25 BCx: sent 11/25 MRSA PCR neg, Staph aureus positive 11/25 Abscess cx: Strep intermedius  Thank you for allowing pharmacy to be a part of this patient's care.  Margarite Gouge, PharmD PGY2 ID Pharmacy Resident Phone between 7 am - 3:30 pm: [phone]  Please check AMION for all [facility] Pharmacy phone numbers After 10:00 PM, call Main Pharmacy [phone]  10/12/2020 11:59 AM

## 2020-10-12 NOTE — Progress Notes (Signed)
PROGRESS NOTE  Charles Thompson LTR:[id number] DOB: [birth year]   PCP: Haywood Pao, MD  Patient is from: Home  DOA: 10/10/2020 LOS: 2  Chief complaints: Left foot.  Brief Narrative / Interim history: 59 year old male with history of DM-2, neuropathy s/p left TMT amputations, HTN, HLD, anxiety, depression and tobacco use disorder presenting with progressive left foot pain and ulcer with drainage, and admitted for sepsis due to left foot cellulitis and diabetic wound infection.    Left foot x-ray with focal soft tissue gas in the superficial subcutaneous tissues over the resection stump at the level of the second metatarsal but no evidence of osteomyelitis. He was a started on vancomycin, cefepime and Flagyl.  Podiatry consulted.    Patient underwent incision and drainage of left foot by Dr. Amalia Hailey on 10/10/2020.  Blood cultures NGTD.  Surgical tissue culture with few Streptococcus intermedius.  Infectious disease consulted.  De-escalated antibiotic to IV Unasyn.  Podiatry planning another I&D on 11/28.  Subjective: Seen and examined earlier this morning.  No major events overnight of this morning.  Reports having a better night.  Pain fairly controlled.  He says he has 4/10 pain in left foot.  No other complaints.  Objective: Vitals:   10/11/20 1444 10/11/20 2036 10/12/20 0500 10/12/20 1343  BP: 108/72 117/69 (!) 103/44 (!) 112/50  Pulse: (!) 102 97 93 82  Resp: 19 18 18 18   Temp: 98.5 F (36.9 C) 98.4 F (36.9 C) 98 F (36.7 C) (!) 97.5 F (36.4 C)  TempSrc: Oral Oral  Oral  SpO2: 98% 99% 96% 98%  Weight:      Height:        Intake/Output Summary (Last 24 hours) at 10/12/2020 1454 Last data filed at 10/12/2020 1300 Gross per 24 hour  Intake 1166.75 ml  Output 1100 ml  Net 66.75 ml   Filed Weights   10/10/20 0523 10/10/20 1000  Weight: 104.3 kg 106.6 kg    Examination:  GENERAL: No apparent distress.  Nontoxic. HEENT: MMM.  Vision and hearing grossly intact.   NECK: Supple.  No apparent JVD.  RESP:  No IWOB.  Fair aeration bilaterally. CVS:  RRR. Heart sounds normal.  ABD/GI/GU: BS+. Abd soft, NTND.  MSK/EXT:  Moves extremities. S/p TMT (old) and I&D of left foot. SKIN: Ace wrap and dressing over left for discharge.  No erythema swelling proximally. NEURO: Awake, alert and oriented appropriately.  No apparent focal neuro deficit. PSYCH: Calm. Normal affect.     Procedures:  10/10/2020-incision and drainage of left foot diabetic wound and abscess  Microbiology summarized: COVID-19 and influenza PCR nonreactive. Blood culture NGTD Surgical tissue culture Streptococcus intermedius.  Assessment & Plan: Sepsis due to purulent cellulitis and diabetic foot ulcer of left foot: POA.  Met criteria with tachycardia and leukocytosis.  Left foot x-ray with focal soft tissue gas in the superficial subcutaneous tissues over the resection stump at the level of the second metatarsal but no evidence of osteomyelitis.  Still with leukocytosis.  Normal ABI in 06/2018.  Culture data as above. -Status post I&D of left foot diabetic wound abscess -Per podiatry, plan for return to the OR on 11/28 for I&D -ID consulted-de-escalate antibiotic to IV Unasyn -Pain control with bowel regimen. -Encouraged tobacco cessation -Trend leukocytosis-improving.  Hyponatremia: Na 128>> 134>136.  Resolved.  Controlled DM-2 with hyperglycemia, neuropathy and diabetic foot ulcer: A1c 5.9%.:  Toujeo 70 units twice daily and Synjardy 10-999 mg. Recent Labs  Lab 10/11/20 1158 10/11/20 1614 10/11/20 2032  10/12/20 0752 10/12/20 1154  GLUCAP 106* 130* 128* 124* 146*  -Continue current insulin regimen -Continue gabapentin and a statin  Anion gap metabolic acidosis: Unclear etiology.  Not in DKA.  No lactic acidosis.  No azotemia.  Due to sepsis?  Improved. -Continue to monitor  Essential hypertension: Normotensive. -Continue amlodipine and lisinopril -Continue holding  HCTZ  Anxiety: Home medications include sertraline 150 mg daily and clonazepam 1 mg nightly. -Continue home Zoloft and Klonopin  Hyperlipidemia:  -Continue statin  Tobacco use disorder: Reports smoking about a pack a day. -Encouraged tobacco cessation -Nicotine patch  Insomnia: Improved. -Continue home Klonopin at night  Obesity/hypoalbuminemia Body mass index is 31.87 kg/m.         DVT prophylaxis:  SCDs Start: 10/10/20 1648  Code Status: Full code Family Communication: Patient and/or RN. Available if any question.  Status is: Inpatient  Remains inpatient appropriate because:Ongoing diagnostic testing needed not appropriate for outpatient work up, IV treatments appropriate due to intensity of illness or inability to take PO and Inpatient level of care appropriate due to severity of illness   Dispo: The patient is from: Home              Anticipated d/c is to: Home              Anticipated d/c date is: > 3 days              Patient currently is not medically stable to d/c.       Consultants:  Podiatry   Sch Meds:  Scheduled Meds: . amLODipine  10 mg Oral Daily  . aspirin EC  81 mg Oral Daily  . atorvastatin  10 mg Oral Daily  . Chlorhexidine Gluconate Cloth  6 each Topical Q0600  . clonazePAM  1 mg Oral QHS  . enoxaparin (LOVENOX) injection  50 mg Subcutaneous Q24H  . gabapentin  300 mg Oral BID  . insulin aspart  0-15 Units Subcutaneous TID WC  . lisinopril  20 mg Oral Daily  . mupirocin ointment  1 application Nasal BID  . nicotine  21 mg Transdermal Daily  . pantoprazole  40 mg Oral Daily  . sertraline  150 mg Oral Daily  . sodium chloride flush  3 mL Intravenous Q12H   Continuous Infusions: . sodium chloride    . ampicillin-sulbactam (UNASYN) IV 3 g (10/12/20 1346)   PRN Meds:.sodium chloride, acetaminophen, albuterol, HYDROmorphone (DILAUDID) injection, ondansetron **OR** ondansetron (ZOFRAN) IV, oxyCODONE-acetaminophen, promethazine, sodium  chloride flush  Antimicrobials: Anti-infectives (From admission, onward)   Start     Dose/Rate Route Frequency Ordered Stop   10/12/20 1400  Ampicillin-Sulbactam (UNASYN) 3 g in sodium chloride 0.9 % 100 mL IVPB        3 g 200 mL/hr over 30 Minutes Intravenous Every 6 hours 10/12/20 1159     10/10/20 1800  vancomycin (VANCOREADY) IVPB 750 mg/150 mL  Status:  Discontinued        750 mg 150 mL/hr over 60 Minutes Intravenous Every 12 hours 10/10/20 0546 10/12/20 1150   10/10/20 1400  ceFEPIme (MAXIPIME) 2 g in sodium chloride 0.9 % 100 mL IVPB  Status:  Discontinued        2 g 200 mL/hr over 30 Minutes Intravenous Every 8 hours 10/10/20 0543 10/12/20 1150   10/10/20 1358  metroNIDAZOLE (FLAGYL) 5-0.79 MG/ML-% IVPB       Note to Pharmacy: Reece Agar  : cabinet override      10/10/20 1358 10/10/20  1435   10/10/20 1330  metroNIDAZOLE (FLAGYL) tablet 500 mg  Status:  Discontinued        500 mg Oral Every 8 hours 10/10/20 0737 10/12/20 1150   10/10/20 0600  vancomycin (VANCOREADY) IVPB 2000 mg/400 mL        2,000 mg 200 mL/hr over 120 Minutes Intravenous  Once 10/10/20 0542 10/10/20 0839   10/10/20 0530  ceFEPIme (MAXIPIME) 2 g in sodium chloride 0.9 % 100 mL IVPB        2 g 200 mL/hr over 30 Minutes Intravenous  Once 10/10/20 0520 10/10/20 0622   10/10/20 0530  metroNIDAZOLE (FLAGYL) IVPB 500 mg        500 mg 100 mL/hr over 60 Minutes Intravenous  Once 10/10/20 0520 10/10/20 0652   10/10/20 0530  vancomycin (VANCOCIN) IVPB 1000 mg/200 mL premix  Status:  Discontinued        1,000 mg 200 mL/hr over 60 Minutes Intravenous  Once 10/10/20 0520 10/10/20 0542       I have personally reviewed the following labs and images: CBC: Recent Labs  Lab 10/10/20 0244 10/11/20 0227 10/12/20 0121  WBC 22.6* 23.3* 17.4*  NEUTROABS 18.6*  --  14.3*  HGB 14.0 12.9* 11.7*  HCT 41.6 39.2 35.4*  MCV 86.8 88.1 87.0  PLT 375 352 342   BMP &GFR Recent Labs  Lab 10/10/20 0244 10/11/20 0227  10/12/20 0121  NA 128* 134* 136  K 3.5 4.1 4.3  CL 92* 98 101  CO2 20* 16* 19*  GLUCOSE 105* 147* 129*  BUN 24* 24* 20  CREATININE 1.47* 1.29* 1.19  CALCIUM 9.0 8.9 8.9  MG  --   --  1.9  PHOS  --   --  2.2*   Estimated Creatinine Clearance: 83.3 mL/min (by C-G formula based on SCr of 1.19 mg/dL). Liver & Pancreas: Recent Labs  Lab 10/10/20 0244 10/11/20 0227 10/12/20 0121  AST 17 17  --   ALT 23 20  --   ALKPHOS 112 119  --   BILITOT 1.2 1.5*  --   PROT 7.4 6.6  --   ALBUMIN 3.3* 2.8* 2.5*   No results for input(s): LIPASE, AMYLASE in the last 168 hours. No results for input(s): AMMONIA in the last 168 hours. Diabetic: Recent Labs    10/10/20 0947  HGBA1C 5.9*   Recent Labs  Lab 10/11/20 1158 10/11/20 1614 10/11/20 2032 10/12/20 0752 10/12/20 1154  GLUCAP 106* 130* 128* 124* 146*   Cardiac Enzymes: No results for input(s): CKTOTAL, CKMB, CKMBINDEX, TROPONINI in the last 168 hours. No results for input(s): PROBNP in the last 8760 hours. Coagulation Profile: Recent Labs  Lab 10/10/20 0600  INR 1.1   Thyroid Function Tests: No results for input(s): TSH, T4TOTAL, FREET4, T3FREE, THYROIDAB in the last 72 hours. Lipid Profile: No results for input(s): CHOL, HDL, LDLCALC, TRIG, CHOLHDL, LDLDIRECT in the last 72 hours. Anemia Panel: No results for input(s): VITAMINB12, FOLATE, FERRITIN, TIBC, IRON, RETICCTPCT in the last 72 hours. Urine analysis:    Component Value Date/Time   COLORURINE YELLOW 12/18/2007 1926   APPEARANCEUR CLEAR 12/18/2007 1926   LABSPEC 1.040 (H) 12/18/2007 1926   PHURINE 6.5 12/18/2007 1926   GLUCOSEU >1000 (A) 12/18/2007 1926   HGBUR NEGATIVE 12/18/2007 1926   HGBUR NEGATIVE POINT OF CARE RESULT 12/18/2007 [place] NEGATIVE 12/18/2007 1926   KETONESUR 40 (A) 12/18/2007 1926   PROTEINUR NEGATIVE 12/18/2007 1926   UROBILINOGEN 1.0 12/18/2007 1926  NITRITE NEGATIVE 12/18/2007 1926   LEUKOCYTESUR NEGATIVE 12/18/2007 1926    Sepsis Labs: Invalid input(s): PROCALCITONIN, [place]  Microbiology: Recent Results (from the past 240 hour(s))  Blood Culture (routine x 2)     Status: None (Preliminary result)   Collection Time: 10/10/20  5:42 AM   Specimen: BLOOD  Result Value Ref Range Status   Specimen Description BLOOD LEFT ANTECUBITAL  Final   Special Requests   Final    BOTTLES DRAWN AEROBIC AND ANAEROBIC Blood Culture adequate volume   Culture   Final    NO GROWTH 2 DAYS Performed at [facility], [address], [place], [place] [address]    Report Status PENDING  Incomplete  Blood Culture (routine x 2)     Status: None (Preliminary result)   Collection Time: 10/10/20  5:42 AM   Specimen: BLOOD  Result Value Ref Range Status   Specimen Description BLOOD WRIST RIGHT  Final   Special Requests   Final    BOTTLES DRAWN AEROBIC AND ANAEROBIC Blood Culture results may not be optimal due to an inadequate volume of blood received in culture bottles   Culture   Final    NO GROWTH 2 DAYS Performed at [facility], [place] [address], [place], [place] [address]    Report Status PENDING  Incomplete  Resp Panel by RT-PCR (Flu A&B, Covid) Nasopharyngeal Swab     Status: None   Collection Time: 10/10/20  5:57 AM   Specimen: Nasopharyngeal Swab; Nasopharyngeal(NP) swabs in vial transport medium  Result Value Ref Range Status   SARS Coronavirus 2 by RT PCR NEGATIVE NEGATIVE Final    Comment: (NOTE) SARS-CoV-2 target nucleic acids are NOT DETECTED.  The SARS-CoV-2 RNA is generally detectable in upper respiratory specimens during the acute phase of infection. The lowest concentration of SARS-CoV-2 viral copies this assay can detect is 138 copies/mL. A negative result does not preclude SARS-Cov-2 infection and should not be used as the sole basis for treatment or other patient management decisions. A negative result may occur with  improper specimen collection/handling, submission of specimen  other than nasopharyngeal swab, presence of viral mutation(s) within the areas targeted by this assay, and inadequate number of viral copies(<138 copies/mL). A negative result must be combined with clinical observations, patient history, and epidemiological information. The expected result is Negative.  Fact Sheet for Patients:  EntrepreneurPulse.com.au  Fact Sheet for Healthcare Providers:  IncredibleEmployment.be  This test is no t yet approved or cleared by the [place] FDA and  has been authorized for detection and/or diagnosis of SARS-CoV-2 by FDA under an Emergency Use Authorization (EUA). This EUA will remain  in effect (meaning this test can be used) for the duration of the COVID-19 declaration under Section 564(b)(1) of the Act, 21 U.S.C.section 360bbb-3(b)(1), unless the authorization is terminated  or revoked sooner.       Influenza A by PCR NEGATIVE NEGATIVE Final   Influenza B by PCR NEGATIVE NEGATIVE Final    Comment: (NOTE) The Xpert Xpress SARS-CoV-2/FLU/RSV plus assay is intended as an aid in the diagnosis of influenza from Nasopharyngeal swab specimens and should not be used as a sole basis for treatment. Nasal washings and aspirates are unacceptable for Xpert Xpress SARS-CoV-2/FLU/RSV testing.  Fact Sheet for Patients: EntrepreneurPulse.com.au  Fact Sheet for Healthcare Providers: IncredibleEmployment.be  This test is not yet approved or cleared by the [place] FDA and has been authorized for detection and/or diagnosis of SARS-CoV-2 by FDA under  an Emergency Use Authorization (EUA). This EUA will remain in effect (meaning this test can be used) for the duration of the COVID-19 declaration under Section 564(b)(1) of the Act, 21 U.S.C. section 360bbb-3(b)(1), unless the authorization is terminated or revoked.  Performed at [facility], [place] [address], [place],  [place] [address]   Surgical pcr screen     Status: Abnormal   Collection Time: 10/10/20 11:11 AM   Specimen: Nasal Mucosa; Nasal Swab  Result Value Ref Range Status   MRSA, PCR NEGATIVE NEGATIVE Final   Staphylococcus aureus POSITIVE (A) NEGATIVE Final    Comment: (NOTE) The Xpert SA Assay (FDA approved for NASAL specimens in patients 52 years of age and older), is one component of a comprehensive surveillance program. It is not intended to diagnose infection nor to guide or monitor treatment. Performed at [facility], [place] [address], [place], [place] [address]   Aerobic/Anaerobic Culture (surgical/deep wound)     Status: None (Preliminary result)   Collection Time: 10/10/20  2:23 PM   Specimen: Abscess; Wound  Result Value Ref Range Status   Specimen Description ABSCESS LEFT FOOT  Final   Special Requests PT ON CEFEPIME  Final   Gram Stain   Final    RARE WBC PRESENT,BOTH PMN AND MONONUCLEAR FEW GRAM POSITIVE COCCI IN PAIRS Performed at [facility], [address] [place], [place] [address]    Culture   Final    FEW STREPTOCOCCUS INTERMEDIUS NO ANAEROBES ISOLATED; CULTURE IN PROGRESS FOR 5 DAYS    Report Status PENDING  Incomplete    Radiology Studies: No results found.    Lazariah Savard T. [place]  If 7PM-7AM, please contact night-coverage [email or web address] 10/12/2020, 2:54 PM

## 2020-10-12 NOTE — Progress Notes (Signed)
   PODIATRY CONSULTATION  Charles Thompson XBM:[id number] DOB:03-Nov-1961 DOA:10/10/2020  CC: LT foot cellulitis/abscess.   Patient status post incision and drainage left foot abscess.  DOS: 10/10/2020.  Patient is seen this morning with sister at bedside.  Patient feeling much better today.  He denies any productive cough.  He is resting comfortably in bed.  He notices significant improvement over the last 24 hours.  He states that he actually has an appetite today.   CBC Latest Ref Rng & Units 10/12/2020 10/11/2020 10/10/2020  WBC 4.0 - 10.5 K/uL 17.4(H) 23.3(H) 22.6(H)  Hemoglobin 13.0 - 17.0 g/dL 11.7(L) 12.9(L) 14.0  Hematocrit 39 - 52 % 35.4(L) 39.2 41.6  Platelets 150 - 400 K/uL 342 352 375   BMP Latest Ref Rng & Units 10/12/2020 10/11/2020 10/10/2020  Glucose 70 - 99 mg/dL 027(O) 536(U) 440(H)  BUN 6 - 20 mg/dL 20 47(Q) 25(Z)  Creatinine 0.61 - 1.24 mg/dL 5.63 8.75(I) 4.33(I)  Sodium 135 - 145 mmol/L 136 134(L) 128(L)  Potassium 3.5 - 5.1 mmol/L 4.3 4.1 3.5  Chloride 98 - 111 mmol/L 101 98 92(L)  CO2 22 - 32 mmol/L 19(L) 16(L) 20(L)  Calcium 8.9 - 10.3 mg/dL 8.9 8.9 9.0      Physical Exam: Upon removal of the dressings, there is a moderate flow of sanguinous drainage.  Packed gauze saturated with mild purulence and sanguinous drainage.  Foot appears stable.  There continues to be some erythema to the posterior heel..  Overall foot demonstrates some improvement.     Assessment/POC: Status post incision and drainage left foot abscess.  DOS: 10/10/2020 -Foot appears stable.  No significant change since last dressing changes  -Dressings were changed today.  The foot was packed with saline wet-to-dry dressings.  Keep clean dry and intact.  Reinforce as needed.  Podiatry will change dressings -Cultures taken intraoperatively pending -Continue IV antibiotics -Irrigation and debridement with delayed primary closure scheduled for tomorrow, 10/13/2020, 7:30 AM.   Preoperative orders placed -Podiatry will continue to follow  *Please contact me directly with any questions or concerns. [phone]     [name], DPM [facility]  Dr. [name], DPM    [address] [place], [place] [address]                Office [phone]  Fax [phone]

## 2020-10-13 ENCOUNTER — Encounter (HOSPITAL_COMMUNITY)
Admission: EM | Disposition: A | Discharge status: Home Health | Payer: Self-pay | Source: Home / Self Care | Attending: Student

## 2020-10-13 ENCOUNTER — Inpatient Hospital Stay (HOSPITAL_COMMUNITY): Payer: Medicare Other

## 2020-10-13 ENCOUNTER — Other Ambulatory Visit: Payer: Self-pay

## 2020-10-13 ENCOUNTER — Inpatient Hospital Stay (HOSPITAL_COMMUNITY): Payer: Medicare Other | Admitting: Certified Registered"

## 2020-10-13 ENCOUNTER — Encounter (HOSPITAL_COMMUNITY): Payer: Self-pay | Admitting: Internal Medicine

## 2020-10-13 DIAGNOSIS — L02612 Cutaneous abscess of left foot: Secondary | ICD-10-CM

## 2020-10-13 DIAGNOSIS — E11621 Type 2 diabetes mellitus with foot ulcer: Secondary | ICD-10-CM | POA: Diagnosis not present

## 2020-10-13 DIAGNOSIS — E8809 Other disorders of plasma-protein metabolism, not elsewhere classified: Secondary | ICD-10-CM | POA: Diagnosis not present

## 2020-10-13 DIAGNOSIS — L03116 Cellulitis of left lower limb: Secondary | ICD-10-CM

## 2020-10-13 DIAGNOSIS — A419 Sepsis, unspecified organism: Secondary | ICD-10-CM | POA: Diagnosis not present

## 2020-10-13 HISTORY — PX: I & D EXTREMITY: SHX5045

## 2020-10-13 LAB — BLOOD CULTURE ID PANEL (REFLEXED) - BCID2

## 2020-10-13 LAB — GLUCOSE, CAPILLARY
Glucose-Capillary: 184 mg/dL — ABNORMAL HIGH (ref 70–99)
Glucose-Capillary: 217 mg/dL — ABNORMAL HIGH (ref 70–99)
Glucose-Capillary: 174 mg/dL — ABNORMAL HIGH (ref 70–99)
Glucose-Capillary: 131 mg/dL — ABNORMAL HIGH (ref 70–99)

## 2020-10-13 LAB — RENAL FUNCTION PANEL
Albumin: 2.3 g/dL — ABNORMAL LOW (ref 3.5–5.0)
Anion gap: 15 (ref 5–15)
BUN: 13 mg/dL (ref 6–20)
CO2: 20 mmol/L — ABNORMAL LOW (ref 22–32)
Calcium: 8.4 mg/dL — ABNORMAL LOW (ref 8.9–10.3)
Chloride: 101 mmol/L (ref 98–111)
Creatinine, Ser: 0.98 mg/dL (ref 0.61–1.24)
GFR, Estimated: >60 mL/min (ref >60)
Glucose, Bld: 132 mg/dL — ABNORMAL HIGH (ref 70–99)
Phosphorus: 2.1 mg/dL — ABNORMAL LOW (ref 2.5–4.6)
Potassium: 4 mmol/L (ref 3.5–5.1)
Sodium: 136 mmol/L (ref 135–145)

## 2020-10-13 LAB — CBC WITH DIFFERENTIAL/PLATELET
Abs Immature Granulocytes: 0.11 10*3/uL — ABNORMAL HIGH (ref 0.00–0.07)
Basophils Absolute: 0.1 10*3/uL (ref 0.0–0.1)
Basophils Relative: 1 %
Eosinophils Absolute: 0.1 10*3/uL (ref 0.0–0.5)
Eosinophils Relative: 1 %
HCT: 34.8 % — ABNORMAL LOW (ref 39.0–52.0)
Hemoglobin: 11.7 g/dL — ABNORMAL LOW (ref 13.0–17.0)
Immature Granulocytes: 1 %
Lymphocytes Relative: 14 %
Lymphs Abs: 1.7 10*3/uL (ref 0.7–4.0)
MCH: 29.1 pg (ref 26.0–34.0)
MCHC: 33.6 g/dL (ref 30.0–36.0)
MCV: 86.6 fL (ref 80.0–100.0)
Monocytes Absolute: 0.7 10*3/uL (ref 0.1–1.0)
Monocytes Relative: 6 %
Neutro Abs: 9.2 10*3/uL — ABNORMAL HIGH (ref 1.7–7.7)
Neutrophils Relative %: 77 %
Platelets: 330 10*3/uL (ref 150–400)
RBC: 4.02 MIL/uL — ABNORMAL LOW (ref 4.22–5.81)
RDW: 12.7 % (ref 11.5–15.5)
WBC: 11.8 10*3/uL — ABNORMAL HIGH (ref 4.0–10.5)
nRBC: 0 % (ref 0.0–0.2)

## 2020-10-13 LAB — MAGNESIUM: Magnesium: 1.7 mg/dL (ref 1.7–2.4)

## 2020-10-13 SURGERY — IRRIGATION AND DEBRIDEMENT EXTREMITY
Anesthesia: General | Laterality: Left

## 2020-10-13 MED ORDER — MIDAZOLAM HCL 5 MG/5ML IJ SOLN
INTRAMUSCULAR | Status: DC | PRN
  Administered 2020-10-13: 2 mg via INTRAVENOUS

## 2020-10-13 MED ORDER — MEPERIDINE HCL 25 MG/ML IJ SOLN: 6.2500 mg | INTRAMUSCULAR | Status: DC | PRN

## 2020-10-13 MED ORDER — LIDOCAINE 2% (20 MG/ML) 5 ML SYRINGE
INTRAMUSCULAR | Status: DC | PRN
  Administered 2020-10-13: 20 mg via INTRAVENOUS

## 2020-10-13 MED ORDER — INSULIN ASPART 100 UNIT/ML ~~LOC~~ SOLN
0.0000 [IU] | Freq: Three times a day (TID) | SUBCUTANEOUS | Status: DC
  Administered 2020-10-13: 3 [IU] via SUBCUTANEOUS
  Administered 2020-10-14 (×2): 2 [IU] via SUBCUTANEOUS

## 2020-10-13 MED ORDER — BUPIVACAINE HCL (PF) 0.5 % IJ SOLN
INTRAMUSCULAR | Status: DC | PRN
  Administered 2020-10-13: 20 mL

## 2020-10-13 MED ORDER — PROPOFOL 10 MG/ML IV BOLUS
INTRAVENOUS | Status: AC
  Filled 2020-10-13: qty 20

## 2020-10-13 MED ORDER — SODIUM CHLORIDE 0.9 % IR SOLN
Status: DC | PRN
  Administered 2020-10-13: 1000 mL

## 2020-10-13 MED ORDER — FENTANYL CITRATE (PF) 250 MCG/5ML IJ SOLN
INTRAMUSCULAR | Status: AC
  Filled 2020-10-13: qty 5

## 2020-10-13 MED ORDER — PROPOFOL 10 MG/ML IV BOLUS
INTRAVENOUS | Status: DC | PRN
  Administered 2020-10-13: 150 mg via INTRAVENOUS
  Administered 2020-10-13: 50 mg via INTRAVENOUS

## 2020-10-13 MED ORDER — HYDROMORPHONE HCL 1 MG/ML IJ SOLN: 0.2500 mg | INTRAMUSCULAR | Status: DC | PRN

## 2020-10-13 MED ORDER — LIDOCAINE HCL 2 % IJ SOLN
INTRAMUSCULAR | Status: AC
  Filled 2020-10-13: qty 20

## 2020-10-13 MED ORDER — MIDAZOLAM HCL 2 MG/2ML IJ SOLN
INTRAMUSCULAR | Status: AC
  Filled 2020-10-13: qty 2

## 2020-10-13 MED ORDER — INSULIN ASPART 100 UNIT/ML ~~LOC~~ SOLN: 0.0000 [IU] | Freq: Every day | SUBCUTANEOUS | Status: DC

## 2020-10-13 MED ORDER — DEXAMETHASONE SODIUM PHOSPHATE 10 MG/ML IJ SOLN
INTRAMUSCULAR | Status: DC | PRN
  Administered 2020-10-13: 4 mg via INTRAVENOUS

## 2020-10-13 MED ORDER — ALBUTEROL SULFATE HFA 108 (90 BASE) MCG/ACT IN AERS
INHALATION_SPRAY | RESPIRATORY_TRACT | Status: DC | PRN
  Administered 2020-10-13: 2 via RESPIRATORY_TRACT

## 2020-10-13 MED ORDER — SODIUM CHLORIDE 0.9 % IR SOLN
Status: DC | PRN
  Administered 2020-10-13: 3000 mL

## 2020-10-13 MED ORDER — ALBUTEROL SULFATE HFA 108 (90 BASE) MCG/ACT IN AERS
INHALATION_SPRAY | RESPIRATORY_TRACT | Status: AC
  Filled 2020-10-13: qty 6.7

## 2020-10-13 MED ORDER — SODIUM CHLORIDE 0.9 % IV BOLUS
500.0000 mL | Freq: Once | INTRAVENOUS | Status: AC
  Administered 2020-10-13: 500 mL via INTRAVENOUS

## 2020-10-13 MED ORDER — LIDOCAINE HCL (PF) 2 % IJ SOLN
INTRAMUSCULAR | Status: AC
  Filled 2020-10-13: qty 5

## 2020-10-13 MED ORDER — BUPIVACAINE HCL 0.5 % IJ SOLN
INTRAMUSCULAR | Status: AC
  Filled 2020-10-13: qty 1

## 2020-10-13 MED ORDER — MAGNESIUM SULFATE 2 GM/50ML IV SOLN
2.0000 g | Freq: Once | INTRAVENOUS | Status: AC
  Administered 2020-10-13: 2 g via INTRAVENOUS
  Filled 2020-10-13: qty 50

## 2020-10-13 MED ORDER — ONDANSETRON HCL 4 MG/2ML IJ SOLN
INTRAMUSCULAR | Status: AC
  Filled 2020-10-13: qty 2

## 2020-10-13 MED ORDER — OXYCODONE HCL 5 MG PO TABS: 5.0000 mg | ORAL_TABLET | Freq: Once | ORAL | Status: DC | PRN

## 2020-10-13 MED ORDER — SUGAMMADEX SODIUM 200 MG/2ML IV SOLN
INTRAVENOUS | Status: DC | PRN
  Administered 2020-10-13: 200 mg via INTRAVENOUS

## 2020-10-13 MED ORDER — TOBRAMYCIN SULFATE 1.2 G IJ SOLR
INTRAMUSCULAR | Status: AC
  Filled 2020-10-13: qty 1.2

## 2020-10-13 MED ORDER — VANCOMYCIN HCL 2000 MG/400ML IV SOLN
2000.0000 mg | Freq: Once | INTRAVENOUS | Status: AC
  Administered 2020-10-13: 2000 mg via INTRAVENOUS
  Filled 2020-10-13: qty 400

## 2020-10-13 MED ORDER — VANCOMYCIN HCL 750 MG/150ML IV SOLN
750.0000 mg | Freq: Two times a day (BID) | INTRAVENOUS | Status: DC
  Administered 2020-10-14: 750 mg via INTRAVENOUS
  Filled 2020-10-13: qty 150

## 2020-10-13 MED ORDER — LACTATED RINGERS IV SOLN: INTRAVENOUS | Status: DC | PRN

## 2020-10-13 MED ORDER — SUCCINYLCHOLINE CHLORIDE 200 MG/10ML IV SOSY
PREFILLED_SYRINGE | INTRAVENOUS | Status: AC
  Filled 2020-10-13: qty 10

## 2020-10-13 MED ORDER — DEXAMETHASONE SODIUM PHOSPHATE 10 MG/ML IJ SOLN
INTRAMUSCULAR | Status: AC
  Filled 2020-10-13: qty 1

## 2020-10-13 MED ORDER — ROCURONIUM BROMIDE 100 MG/10ML IV SOLN
INTRAVENOUS | Status: DC | PRN
  Administered 2020-10-13: 30 mg via INTRAVENOUS

## 2020-10-13 MED ORDER — PROMETHAZINE HCL 25 MG/ML IJ SOLN: 6.2500 mg | INTRAMUSCULAR | Status: DC | PRN

## 2020-10-13 MED ORDER — FENTANYL CITRATE (PF) 250 MCG/5ML IJ SOLN
INTRAMUSCULAR | Status: DC | PRN
  Administered 2020-10-13 (×5): 50 ug via INTRAVENOUS

## 2020-10-13 MED ORDER — OXYCODONE HCL 5 MG/5ML PO SOLN: 5.0000 mg | Freq: Once | ORAL | Status: DC | PRN

## 2020-10-13 MED ORDER — CHLORHEXIDINE GLUCONATE 0.12 % MT SOLN
OROMUCOSAL | Status: AC
  Administered 2020-10-13: 15 mL
  Filled 2020-10-13: qty 15

## 2020-10-13 MED ORDER — INSULIN ASPART 100 UNIT/ML ~~LOC~~ SOLN
4.0000 [IU] | Freq: Three times a day (TID) | SUBCUTANEOUS | Status: DC
  Administered 2020-10-13 – 2020-10-14 (×4): 4 [IU] via SUBCUTANEOUS

## 2020-10-13 MED ORDER — ONDANSETRON HCL 4 MG/2ML IJ SOLN
INTRAMUSCULAR | Status: DC | PRN
  Administered 2020-10-13: 4 mg via INTRAVENOUS

## 2020-10-13 MED ORDER — SUCCINYLCHOLINE CHLORIDE 20 MG/ML IJ SOLN
INTRAMUSCULAR | Status: DC | PRN
  Administered 2020-10-13: 120 mg via INTRAVENOUS

## 2020-10-13 MED ORDER — MIDAZOLAM HCL 2 MG/2ML IJ SOLN: 0.5000 mg | Freq: Once | INTRAMUSCULAR | Status: DC | PRN

## 2020-10-13 SURGICAL SUPPLY — 57 items
BLADE SAW SGTL 83.5X18.5 (BLADE) ×1 IMPLANT
BLADE SURG 15 STRL LF DISP TIS (BLADE) ×3 IMPLANT
BLADE SURG 15 STRL SS (BLADE) ×3
BNDG COHESIVE 6X5 TAN STRL LF (GAUZE/BANDAGES/DRESSINGS) IMPLANT
BNDG ELASTIC 4X5.8 VLCR NS LF (GAUZE/BANDAGES/DRESSINGS) ×1 IMPLANT
BNDG ELASTIC 4X5.8 VLCR STR LF (GAUZE/BANDAGES/DRESSINGS) ×1 IMPLANT
BNDG ESMARK 4X9 LF (GAUZE/BANDAGES/DRESSINGS) ×2 IMPLANT
BNDG GAUZE ELAST 4 BULKY (GAUZE/BANDAGES/DRESSINGS) ×3 IMPLANT
BOWL SMART MIX CTS (DISPOSABLE) IMPLANT
CHLORAPREP W/TINT 26 (MISCELLANEOUS) ×1 IMPLANT
COVER SURGICAL LIGHT HANDLE (MISCELLANEOUS) ×2 IMPLANT
COVER WAND RF STERILE (DRAPES) ×1 IMPLANT
CUFF TOURN SGL QUICK 18X4 (TOURNIQUET CUFF) ×2 IMPLANT
CUFF TOURN SGL QUICK 34 (TOURNIQUET CUFF) ×1
CUFF TRNQT CYL 34X4.125X (TOURNIQUET CUFF) ×1 IMPLANT
DRAPE OEC MINIVIEW 54X84 (DRAPES) ×2 IMPLANT
DRAPE U-SHAPE 47X51 STRL (DRAPES) ×2 IMPLANT
DRSG PAD ABDOMINAL 8X10 ST (GAUZE/BANDAGES/DRESSINGS) ×2 IMPLANT
ELECT CAUTERY BLADE 6.4 (BLADE) ×2 IMPLANT
ELECT REM PT RETURN 9FT ADLT (ELECTROSURGICAL)
ELECTRODE REM PT RTRN 9FT ADLT (ELECTROSURGICAL) IMPLANT
GAUZE PACKING IODOFORM 1/2 (PACKING) ×1 IMPLANT
GAUZE SPONGE 4X4 12PLY STRL (GAUZE/BANDAGES/DRESSINGS) ×3 IMPLANT
GAUZE XEROFORM 1X8 LF (GAUZE/BANDAGES/DRESSINGS) ×3 IMPLANT
GLOVE BIO SURGEON STRL SZ8 (GLOVE) ×2 IMPLANT
GLOVE BIOGEL PI IND STRL 8 (GLOVE) ×1 IMPLANT
GLOVE BIOGEL PI INDICATOR 8 (GLOVE) ×1
GOWN STRL REUS W/ TWL LRG LVL3 (GOWN DISPOSABLE) ×2 IMPLANT
GOWN STRL REUS W/TWL LRG LVL3 (GOWN DISPOSABLE) ×2
HANDPIECE INTERPULSE COAX TIP (DISPOSABLE)
KIT BASIN OR (CUSTOM PROCEDURE TRAY) ×2 IMPLANT
KIT REMOVER STAPLE SKIN (MISCELLANEOUS) ×1 IMPLANT
KIT TURNOVER KIT B (KITS) ×2 IMPLANT
MANIFOLD NEPTUNE II (INSTRUMENTS) ×2 IMPLANT
NDL HYPO 25GX1X1/2 BEV (NEEDLE) ×1 IMPLANT
NEEDLE HYPO 25GX1X1/2 BEV (NEEDLE) ×4 IMPLANT
NS IRRIG 1000ML POUR BTL (IV SOLUTION) ×2 IMPLANT
PACK ORTHO EXTREMITY (CUSTOM PROCEDURE TRAY) ×2 IMPLANT
PAD ARMBOARD 7.5X6 YLW CONV (MISCELLANEOUS) ×4 IMPLANT
PAD CAST 4YDX4 CTTN HI CHSV (CAST SUPPLIES) ×1 IMPLANT
PADDING CAST COTTON 4X4 STRL (CAST SUPPLIES) ×1
PADDING CAST COTTON 6X4 STRL (CAST SUPPLIES) ×2 IMPLANT
SET HNDPC FAN SPRY TIP SCT (DISPOSABLE) IMPLANT
SOL PREP POV-IOD 4OZ 10% (MISCELLANEOUS) ×3 IMPLANT
STAPLER VISISTAT (STAPLE) ×1 IMPLANT
STAPLER VISISTAT 35W (STAPLE) ×2 IMPLANT
STOCKINETTE IMPERVIOUS 9X36 MD (GAUZE/BANDAGES/DRESSINGS) ×2 IMPLANT
SUT ETHILON 3 0 FSL (SUTURE) ×2 IMPLANT
SUT PROLENE 3 0 PS 2 (SUTURE) ×2 IMPLANT
SWAB COLLECTION DEVICE MRSA (MISCELLANEOUS) ×1 IMPLANT
SWAB CULTURE LIQ STUART DBL (MISCELLANEOUS) ×3 IMPLANT
SWAB CULTURE LIQUID MINI MALE (MISCELLANEOUS) ×2 IMPLANT
SYR CONTROL 10ML LL (SYRINGE) ×3 IMPLANT
TOWEL GREEN STERILE (TOWEL DISPOSABLE) ×2 IMPLANT
TOWEL GREEN STERILE FF (TOWEL DISPOSABLE) ×2 IMPLANT
TUBE CONNECTING 12X1/4 (SUCTIONS) ×2 IMPLANT
YANKAUER SUCT BULB TIP NO VENT (SUCTIONS) ×2 IMPLANT

## 2020-10-13 NOTE — Progress Notes (Signed)
PROGRESS NOTE  Charles Thompson ZOX:[id number] DOB: 01/08/1961   PCP: Haywood Pao, MD  Patient is from: Home  DOA: 10/10/2020 LOS: 3  Chief complaints: Left foot.  Brief Narrative / Interim history: 59 year old male with history of DM-2, neuropathy s/p left TMT amputations, HTN, HLD, anxiety, depression and tobacco use disorder presenting with progressive left foot pain and ulcer with drainage, and admitted for sepsis due to left foot cellulitis and diabetic wound infection.    Left foot x-ray with focal soft tissue gas in the superficial subcutaneous tissues over the resection stump at the level of the second metatarsal but no evidence of osteomyelitis. He was a started on vancomycin, cefepime and Flagyl.  Podiatry consulted.    Patient underwent incision and drainage of left foot by Dr. Amalia Hailey on 10/10/2020.  Blood cultures NGTD.  Surgical tissue culture with few Streptococcus intermedius.  Infectious disease consulted.  De-escalated antibiotic to IV Unasyn.  Hydrated I&D on 10/13/2020 with delayed primary closure of left foot wound.  Deep wound cultures taken and sent to pathology.   Subjective: Seen and examined this afternoon after he returned from surgery.  No complaints but he would like to go home tomorrow.  He says he signed himself pain and will sign himself out if he does not get discharged tomorrow.  Denies pain, shortness of breath, GI or UTI symptoms.  Objective: Vitals:   10/13/20 0938 10/13/20 0953 10/13/20 1007 10/13/20 1405  BP: (!) 133/51 131/68 (!) 144/65 (!) 97/46  Pulse: 100 100 97 65  Resp: 20 20 16 20   Temp:  (!) 97.3 F (36.3 C) 97.9 F (36.6 C) 98.4 F (36.9 C)  TempSrc:    Axillary  SpO2: 98% 95% 96% 97%  Weight:      Height:        Intake/Output Summary (Last 24 hours) at 10/13/2020 1601 Last data filed at 10/13/2020 1540 Gross per 24 hour  Intake 1999.42 ml  Output 930 ml  Net 1069.42 ml   Filed Weights   10/10/20 0523 10/10/20 1000   Weight: 104.3 kg 106.6 kg    Examination:  GENERAL: No apparent distress.  Nontoxic. HEENT: MMM.  Vision and hearing grossly intact.  NECK: Supple.  No apparent JVD.  RESP:  No IWOB.  Fair aeration bilaterally. CVS:  RRR. Heart sounds normal.  ABD/GI/GU: BS+. Abd soft, NTND.  MSK/EXT:  Moves extremities.  S/p TMT (old) and I&D of left foot. SKIN: Ace wrap and dressing over left foot.  No erythema or swelling proximally. NEURO: Awake, alert and oriented appropriately.  No apparent focal neuro deficit. PSYCH: Calm. Normal affect.    Procedures:  10/10/2020-I&D of left foot diabetic wound and abscess 10/13/2020-I&D of left foot with delayed primary closure  Microbiology summarized: 11/24 COVID-19 and influenza PCR nonreactive. 11/24 blood culture NGTD 11/25 surgical tissue culture Streptococcus intermedius. 11/28 surgical tissue culture and pathology pending.   Assessment & Plan: Sepsis due to purulent cellulitis and diabetic foot ulcer of left foot: POA.  Met criteria with tachycardia and leukocytosis.  Left foot x-ray with focal soft tissue gas in the superficial subcutaneous tissues over the resection stump at the level of the second metatarsal but no evidence of osteomyelitis.  Still with leukocytosis.  Normal ABI in 06/2018.  Culture data as above.  Leukocytosis improved. -Status post I&D of left foot on 11/25 and 11/28 with delayed primary wound closure -Per podiatry, plan for return to the OR on 11/28 for I&D -ID consulted-de-escalated antibiotic to  IV Unasyn -Pain control with bowel regimen. -Encouraged tobacco cessation -Trend leukocytosis-improving. -Follow surgical wound culture  Hyponatremia: Na 128>> 134>136.  Resolved.  Controlled DM-2 with hyperglycemia, neuropathy and diabetic foot ulcer: A1c 5.9%.:  Toujeo 70 units twice daily and Synjardy 10-999 mg. Recent Labs  Lab 10/12/20 1154 10/12/20 1656 10/12/20 2127 10/13/20 0934 10/13/20 1154  GLUCAP 146*  136* 123* 184* 217*  -Continue SSI-moderate.  Add nightly and mealtime coverage. -Continue gabapentin and a statin  Anion gap metabolic acidosis: Unclear etiology.Due to sepsis?  Improved. -Continue to monitor  Essential hypertension: Soft BP this afternoon. -Received amlodipine and lisinopril today.  Will hold in the morning if hypotensive. -NS bolus 500 cc.  Hypophosphatemia/hypomagnesemia -Replenish and recheck.  Anxiety: Home medications include sertraline 150 mg daily and clonazepam 1 mg nightly. -Continue home Zoloft and Klonopin  Hyperlipidemia:  -Continue statin  Tobacco use disorder: Reports smoking about a pack a day. -Encouraged tobacco cessation -Nicotine patch  Insomnia: Improved. -Continue home Klonopin at night  Obesity/hypoalbuminemia Body mass index is 31.87 kg/m.         DVT prophylaxis:  SCDs Start: 10/10/20 1648  Code Status: Full code Family Communication: Patient and/or RN. Available if any question.  Status is: Inpatient  Remains inpatient appropriate because:Ongoing diagnostic testing needed not appropriate for outpatient work up, IV treatments appropriate due to intensity of illness or inability to take PO and Inpatient level of care appropriate due to severity of illness   Dispo: The patient is from: Home              Anticipated d/c is to: Home              Anticipated d/c date is: 2 days              Patient currently is not medically stable to d/c.       Consultants:  Podiatry Infectious disease   Sch Meds:  Scheduled Meds: . amLODipine  10 mg Oral Daily  . aspirin EC  81 mg Oral Daily  . atorvastatin  10 mg Oral Daily  . Chlorhexidine Gluconate Cloth  6 each Topical Q0600  . clonazePAM  1 mg Oral QHS  . enoxaparin (LOVENOX) injection  50 mg Subcutaneous Q24H  . gabapentin  300 mg Oral BID  . insulin aspart  0-15 Units Subcutaneous TID WC  . mupirocin ointment  1 application Nasal BID  . nicotine  21 mg  Transdermal Daily  . pantoprazole  40 mg Oral Daily  . sertraline  150 mg Oral Daily  . sodium chloride flush  3 mL Intravenous Q12H   Continuous Infusions: . sodium chloride    . ampicillin-sulbactam (UNASYN) IV 3 g (10/13/20 1425)  . magnesium sulfate bolus IVPB     PRN Meds:.sodium chloride, acetaminophen, albuterol, HYDROmorphone (DILAUDID) injection, ondansetron **OR** ondansetron (ZOFRAN) IV, oxyCODONE-acetaminophen, promethazine, sodium chloride flush  Antimicrobials: Anti-infectives (From admission, onward)   Start     Dose/Rate Route Frequency Ordered Stop   10/12/20 1400  Ampicillin-Sulbactam (UNASYN) 3 g in sodium chloride 0.9 % 100 mL IVPB        3 g 200 mL/hr over 30 Minutes Intravenous Every 6 hours 10/12/20 1159     10/10/20 1800  vancomycin (VANCOREADY) IVPB 750 mg/150 mL  Status:  Discontinued        750 mg 150 mL/hr over 60 Minutes Intravenous Every 12 hours 10/10/20 0546 10/12/20 1150   10/10/20 1400  ceFEPIme (MAXIPIME) 2 g in sodium  chloride 0.9 % 100 mL IVPB  Status:  Discontinued        2 g 200 mL/hr over 30 Minutes Intravenous Every 8 hours 10/10/20 0543 10/12/20 1150   10/10/20 1358  metroNIDAZOLE (FLAGYL) 5-0.79 MG/ML-% IVPB       Note to Pharmacy: Reece Agar  : cabinet override      10/10/20 1358 10/10/20 1435   10/10/20 1330  metroNIDAZOLE (FLAGYL) tablet 500 mg  Status:  Discontinued        500 mg Oral Every 8 hours 10/10/20 0737 10/12/20 1150   10/10/20 0600  vancomycin (VANCOREADY) IVPB 2000 mg/400 mL        2,000 mg 200 mL/hr over 120 Minutes Intravenous  Once 10/10/20 0542 10/10/20 0839   10/10/20 0530  ceFEPIme (MAXIPIME) 2 g in sodium chloride 0.9 % 100 mL IVPB        2 g 200 mL/hr over 30 Minutes Intravenous  Once 10/10/20 0520 10/10/20 0622   10/10/20 0530  metroNIDAZOLE (FLAGYL) IVPB 500 mg        500 mg 100 mL/hr over 60 Minutes Intravenous  Once 10/10/20 0520 10/10/20 0652   10/10/20 0530  vancomycin (VANCOCIN) IVPB 1000 mg/200 mL  premix  Status:  Discontinued        1,000 mg 200 mL/hr over 60 Minutes Intravenous  Once 10/10/20 0520 10/10/20 0542       I have personally reviewed the following labs and images: CBC: Recent Labs  Lab 10/10/20 0244 10/11/20 0227 10/12/20 0121 10/13/20 0146  WBC 22.6* 23.3* 17.4* 11.8*  NEUTROABS 18.6*  --  14.3* 9.2*  HGB 14.0 12.9* 11.7* 11.7*  HCT 41.6 39.2 35.4* 34.8*  MCV 86.8 88.1 87.0 86.6  PLT 375 352 342 330   BMP &GFR Recent Labs  Lab 10/10/20 0244 10/11/20 0227 10/12/20 0121 10/13/20 0146  NA 128* 134* 136 136  K 3.5 4.1 4.3 4.0  CL 92* 98 101 101  CO2 20* 16* 19* 20*  GLUCOSE 105* 147* 129* 132*  BUN 24* 24* 20 13  CREATININE 1.47* 1.29* 1.19 0.98  CALCIUM 9.0 8.9 8.9 8.4*  MG  --   --  1.9 1.7  PHOS  --   --  2.2* 2.1*   Estimated Creatinine Clearance: 101.1 mL/min (by C-G formula based on SCr of 0.98 mg/dL). Liver & Pancreas: Recent Labs  Lab 10/10/20 0244 10/11/20 0227 10/12/20 0121 10/13/20 0146  AST 17 17  --   --   ALT 23 20  --   --   ALKPHOS 112 119  --   --   BILITOT 1.2 1.5*  --   --   PROT 7.4 6.6  --   --   ALBUMIN 3.3* 2.8* 2.5* 2.3*   No results for input(s): LIPASE, AMYLASE in the last 168 hours. No results for input(s): AMMONIA in the last 168 hours. Diabetic: No results for input(s): HGBA1C in the last 72 hours. Recent Labs  Lab 10/12/20 1154 10/12/20 1656 10/12/20 2127 10/13/20 0934 10/13/20 1154  GLUCAP 146* 136* 123* 184* 217*   Cardiac Enzymes: No results for input(s): CKTOTAL, CKMB, CKMBINDEX, TROPONINI in the last 168 hours. No results for input(s): PROBNP in the last 8760 hours. Coagulation Profile: Recent Labs  Lab 10/10/20 0600  INR 1.1   Thyroid Function Tests: No results for input(s): TSH, T4TOTAL, FREET4, T3FREE, THYROIDAB in the last 72 hours. Lipid Profile: No results for input(s): CHOL, HDL, LDLCALC, TRIG, CHOLHDL, LDLDIRECT in the last  72 hours. Anemia Panel: No results for input(s):  VITAMINB12, FOLATE, FERRITIN, TIBC, IRON, RETICCTPCT in the last 72 hours. Urine analysis:    Component Value Date/Time   COLORURINE YELLOW 12/18/2007 1926   APPEARANCEUR CLEAR 12/18/2007 1926   LABSPEC 1.040 (H) 12/18/2007 1926   PHURINE 6.5 12/18/2007 1926   GLUCOSEU >1000 (A) 12/18/2007 1926   HGBUR NEGATIVE 12/18/2007 1926   HGBUR NEGATIVE POINT OF CARE RESULT 12/18/2007 [place] NEGATIVE 12/18/2007 1926   KETONESUR 40 (A) 12/18/2007 1926   PROTEINUR NEGATIVE 12/18/2007 1926   UROBILINOGEN 1.0 12/18/2007 1926   NITRITE NEGATIVE 12/18/2007 1926   LEUKOCYTESUR NEGATIVE 12/18/2007 1926   Sepsis Labs: Invalid input(s): PROCALCITONIN, [place]  Microbiology: Recent Results (from the past 240 hour(s))  Blood Culture (routine x 2)     Status: None (Preliminary result)   Collection Time: 10/10/20  5:42 AM   Specimen: BLOOD  Result Value Ref Range Status   Specimen Description BLOOD LEFT ANTECUBITAL  Final   Special Requests   Final    BOTTLES DRAWN AEROBIC AND ANAEROBIC Blood Culture adequate volume   Culture   Final    NO GROWTH 3 DAYS Performed at [facility], [address], , [place] [address]    Report Status PENDING  Incomplete  Blood Culture (routine x 2)     Status: None (Preliminary result)   Collection Time: 10/10/20  5:42 AM   Specimen: BLOOD  Result Value Ref Range Status   Specimen Description BLOOD WRIST RIGHT  Final   Special Requests   Final    BOTTLES DRAWN AEROBIC AND ANAEROBIC Blood Culture results may not be optimal due to an inadequate volume of blood received in culture bottles   Culture  Setup Time   Final    GRAM POSITIVE COCCI IN CHAINS ANAEROBIC BOTTLE ONLY CRITICAL VALUE NOTED.  VALUE IS CONSISTENT WITH PREVIOUSLY REPORTED AND CALLED VALUE. Performed at [facility], [place] [address] [place], [place] [address]    Culture GRAM POSITIVE COCCI  Final   Report Status PENDING  Incomplete  Resp Panel by RT-PCR (Flu  A&B, Covid) Nasopharyngeal Swab     Status: None   Collection Time: 10/10/20  5:57 AM   Specimen: Nasopharyngeal Swab; Nasopharyngeal(NP) swabs in vial transport medium  Result Value Ref Range Status   SARS Coronavirus 2 by RT PCR NEGATIVE NEGATIVE Final    Comment: (NOTE) SARS-CoV-2 target nucleic acids are NOT DETECTED.  The SARS-CoV-2 RNA is generally detectable in upper respiratory specimens during the acute phase of infection. The lowest concentration of SARS-CoV-2 viral copies this assay can detect is 138 copies/mL. A negative result does not preclude SARS-Cov-2 infection and should not be used as the sole basis for treatment or other patient management decisions. A negative result may occur with  improper specimen collection/handling, submission of specimen other than nasopharyngeal swab, presence of viral mutation(s) within the areas targeted by this assay, and inadequate number of viral copies(<138 copies/mL). A negative result must be combined with clinical observations, patient history, and epidemiological information. The expected result is Negative.  Fact Sheet for Patients:  EntrepreneurPulse.com.au  Fact Sheet for Healthcare Providers:  IncredibleEmployment.be  This test is no t yet approved or cleared by the [place] FDA and  has been authorized for detection and/or diagnosis of SARS-CoV-2 by FDA under an Emergency Use Authorization (EUA). This EUA will remain  in effect (meaning this test can be used) for the duration of  the COVID-19 declaration under Section 564(b)(1) of the Act, 21 U.S.C.section 360bbb-3(b)(1), unless the authorization is terminated  or revoked sooner.       Influenza A by PCR NEGATIVE NEGATIVE Final   Influenza B by PCR NEGATIVE NEGATIVE Final    Comment: (NOTE) The Xpert Xpress SARS-CoV-2/FLU/RSV plus assay is intended as an aid in the diagnosis of influenza from Nasopharyngeal swab specimens  and should not be used as a sole basis for treatment. Nasal washings and aspirates are unacceptable for Xpert Xpress SARS-CoV-2/FLU/RSV testing.  Fact Sheet for Patients: EntrepreneurPulse.com.au  Fact Sheet for Healthcare Providers: IncredibleEmployment.be  This test is not yet approved or cleared by the [place] FDA and has been authorized for detection and/or diagnosis of SARS-CoV-2 by FDA under an Emergency Use Authorization (EUA). This EUA will remain in effect (meaning this test can be used) for the duration of the COVID-19 declaration under Section 564(b)(1) of the Act, 21 U.S.C. section 360bbb-3(b)(1), unless the authorization is terminated or revoked.  Performed at [facility], [place] [address], [place], [place] [address]   Surgical pcr screen     Status: Abnormal   Collection Time: 10/10/20 11:11 AM   Specimen: Nasal Mucosa; Nasal Swab  Result Value Ref Range Status   MRSA, PCR NEGATIVE NEGATIVE Final   Staphylococcus aureus POSITIVE (A) NEGATIVE Final    Comment: (NOTE) The Xpert SA Assay (FDA approved for NASAL specimens in patients 11 years of age and older), is one component of a comprehensive surveillance program. It is not intended to diagnose infection nor to guide or monitor treatment. Performed at [facility], [place] [address], [place], [place] [address]   Aerobic/Anaerobic Culture (surgical/deep wound)     Status: None (Preliminary result)   Collection Time: 10/10/20  2:23 PM   Specimen: Abscess; Wound  Result Value Ref Range Status   Specimen Description ABSCESS LEFT FOOT  Final   Special Requests PT ON CEFEPIME  Final   Gram Stain   Final    RARE WBC PRESENT,BOTH PMN AND MONONUCLEAR FEW GRAM POSITIVE COCCI IN PAIRS Performed at [facility], [address] [place], [place] [address]    Culture   Final    FEW STREPTOCOCCUS INTERMEDIUS NO ANAEROBES ISOLATED; CULTURE IN PROGRESS FOR 5 DAYS     Report Status PENDING  Incomplete  Aerobic/Anaerobic Culture (surgical/deep wound)     Status: None (Preliminary result)   Collection Time: 10/13/20  8:13 AM   Specimen: PATH Other; Tissue  Result Value Ref Range Status   Specimen Description ABSCESS LEFT FOOT  Final   Special Requests SPEC A LEFT FOOT ABSCESS  Final   Gram Stain   Final    NO WBC SEEN NO ORGANISMS SEEN Performed at [facility], [place] [address] [place], [place] [address]    Culture PENDING  Incomplete   Report Status PENDING  Incomplete    Radiology Studies: DG MINI C-ARM IMAGE ONLY  Result Date: 10/13/2020 There is no interpretation for this exam.  This order is for images obtained during a surgical procedure.  Please See "Surgeries" Tab for more information regarding the procedure.      Rakiyah Esch T. [place]  If 7PM-7AM, please contact night-coverage [email or web address] 10/13/2020, 4:01 PM

## 2020-10-13 NOTE — Progress Notes (Signed)
Pharmacy Antibiotic Note  Charles Thompson is an 59 y.o. male who presented to [facility] on 10/10/2020 with a chief complaint of progressive foot pain / cellulitis, s/p I&D today. Initially started on broad spectrum ABX- vancomycin, flagyl and cefepime.  Changed to Unasyn on 11/27 for foot abscess Cx with streptococcus intermedius.   Afebrile, wbc elevated but improved 23>11, Cr stable at 1.  Microlab called today with + BCID staph epidermidis in 2 separate blood Cx one from each arm.  Discussed with provider - will resume vancomycin for now.   Plan: Discontinue Unasyn Resume Vancomycin 2gm load then 750mg  q12h Follow-up renal function, cultures, clinical signs/symptoms of infection resolution  Height: 6' (182.9 cm) Weight: 106.6 kg (235 lb) IBW/kg (Calculated) : 77.6  Temp (24hrs), Avg:97.8 F (36.6 C), Min:97.1 F (36.2 C), Max:98.4 F (36.9 C)  Recent Labs  Lab 10/10/20 0244 10/10/20 0603 10/11/20 0227 10/12/20 0121 10/13/20 0146  WBC 22.6*  --  23.3* 17.4* 11.8*  CREATININE 1.47*  --  1.29* 1.19 0.98  LATICACIDVEN 1.1 1.1  --   --   --     Estimated Creatinine Clearance: 101.1 mL/min (by C-G formula based on SCr of 0.98 mg/dL).    No Known Allergies  Antimicrobials this admission: 11/25 Vanc>>11/27 Resume 11/28>  11/25 Cefepime>>11/27 11/25 Flagyl>>11/27 11/27 Unasyn >>11/28   Microbiology results: 11/25 BCx: 2/2 staph epidermidis 11/25 MRSA PCR neg, Staph aureus positive 11/25 Abscess cx: Strep intermedius    12/25 Pharm.D. CPP, BCPS Clinical Pharmacist [phone] 10/13/2020 8:11 PM     Please check AMION for all [facility] phone numbers After 10:00 PM, call Main Pharmacy [phone]

## 2020-10-13 NOTE — Brief Op Note (Signed)
10/10/2020 - 10/13/2020  8:59 AM  PATIENT:  Charles Thompson  59 y.o. male  PRE-OPERATIVE DIAGNOSIS:  CELLULITIS LEFT FOOT  POST-OPERATIVE DIAGNOSIS:  CELLULITIS LEFT FOOT  PROCEDURE:  Procedure(s): IRRIGATION AND DEBRIDEMENT FOOT (Left)  SURGEON:  Surgeon(s) and Role:    [name], DPM - Primary  PHYSICIAN ASSISTANT:   ASSISTANTS: none   ANESTHESIA:   general  EBL:    BLOOD ADMINISTERED:none  DRAINS: none   LOCAL MEDICATIONS USED:  MARCAINE     SPECIMEN:  Source of Specimen:  culture abscess left foot  DISPOSITION OF SPECIMEN:  PATHOLOGY  COUNTS:  YES  TOURNIQUET:  * No tourniquets in log *  DICTATION: .Dragon Dictation  PLAN OF CARE: Admit to inpatient   PATIENT DISPOSITION:  PACU - hemodynamically stable.   Delay start of Pharmacological VTE agent (>24hrs) due to surgical blood loss or risk of bleeding: not applicable

## 2020-10-13 NOTE — Anesthesia Postprocedure Evaluation (Signed)
Anesthesia Post Note  Patient: Amaan Meyer  Procedure(s) Performed: IRRIGATION AND DEBRIDEMENT FOOT (Left )     Patient location during evaluation: PACU Anesthesia Type: General Level of consciousness: patient cooperative, sedated and oriented Pain management: pain level controlled Vital Signs Assessment: post-procedure vital signs reviewed and stable Respiratory status: spontaneous breathing, nonlabored ventilation and respiratory function stable Cardiovascular status: blood pressure returned to baseline and stable Postop Assessment: no apparent nausea or vomiting Anesthetic complications: no   No complications documented.  Last Vitals:  Vitals:   10/13/20 0923 10/13/20 0938  BP: 116/70 (!) 133/51  Pulse: (!) 101 100  Resp: 20 20  Temp:    SpO2: 96% 98%    Last Pain:  Vitals:   10/13/20 0938  TempSrc:   PainSc: 0-No pain    LLE Motor Response: Purposeful movement (10/13/20 0938) LLE Sensation: Numbness (from block) (10/13/20 4742)          Erling Cruz. Delmi Fulfer

## 2020-10-13 NOTE — Transfer of Care (Signed)
Immediate Anesthesia Transfer of Care Note  Patient: Charles Thompson  Procedure(s) Performed: IRRIGATION AND DEBRIDEMENT FOOT (Left )  Patient Location: PACU  Anesthesia Type:General  Level of Consciousness: awake, alert  and oriented  Airway & Oxygen Therapy: Patient Spontanous Breathing and Patient connected to face mask oxygen  Post-op Assessment: Report given to RN, Post -op Vital signs reviewed and stable and Patient moving all extremities  Post vital signs: Reviewed and stable  Last Vitals:  Vitals Value Taken Time  BP 108/68 10/13/20 0908  Temp    Pulse 107 10/13/20 0913  Resp 21 10/13/20 0913  SpO2 96 % 10/13/20 0913  Vitals shown include unvalidated device data.  Last Pain:  Vitals:   10/13/20 0709  TempSrc:   PainSc: 4       Patients Stated Pain Goal: 3 (10/13/20 0709)  Complications: No complications documented.

## 2020-10-13 NOTE — Anesthesia Preprocedure Evaluation (Addendum)
Anesthesia Evaluation  Patient identified by MRN, date of birth, ID band Patient awake    Reviewed: Allergy & Precautions, NPO status , Patient's Chart, lab work & pertinent test results  History of Anesthesia Complications Negative for: history of anesthetic complications  Airway Mallampati: I  TM Distance: >3 FB Neck ROM: Full    Dental  (+) Dental Advisory Given   Pulmonary Current Smoker and Patient abstained from smoking.,  10/10/2020 SARS coronavirus NEG   breath sounds clear to auscultation       Cardiovascular hypertension, Pt. on medications (-) angina+ Peripheral Vascular Disease   Rhythm:Regular Rate:Normal     Neuro/Psych Anxiety Depression DM peripheral neuropathy    GI/Hepatic Neg liver ROS, GERD  Medicated and Poorly Controlled,  Endo/Other  diabetes (glu 132), Insulin DependentMorbid obesity  Renal/GU negative Renal ROS     Musculoskeletal  (+) Arthritis ,   Abdominal   Peds  Hematology negative hematology ROS (+)   Anesthesia Other Findings   Reproductive/Obstetrics                            Anesthesia Physical Anesthesia Plan  ASA: III  Anesthesia Plan: General   Post-op Pain Management:    Induction: Intravenous  PONV Risk Score and Plan: 1 and Ondansetron and Dexamethasone  Airway Management Planned: Oral ETT  Additional Equipment: None  Intra-op Plan:   Post-operative Plan: Extubation in OR  Informed Consent: I have reviewed the patients History and Physical, chart, labs and discussed the procedure including the risks, benefits and alternatives for the proposed anesthesia with the patient or authorized representative who has indicated his/her understanding and acceptance.     Dental advisory given  Plan Discussed with: CRNA and Surgeon  Anesthesia Plan Comments:        Anesthesia Quick Evaluation

## 2020-10-13 NOTE — Progress Notes (Signed)
PHARMACY - PHYSICIAN COMMUNICATION CRITICAL VALUE ALERT - BLOOD CULTURE IDENTIFICATION (BCID)  Charles Thompson is an 59 y.o. male who presented to [facility] on 10/10/2020 with a chief complaint of progressive foot pain / cellulitis, s/p I&D today. Initially started on broad spectrum ABX- vancomycin, flagyl and cefepime.  Changed to Unasyn on 11/27. Afebrile, wbc elevated but improved 23>11.  Microlab called today with + BCID staph epi in 2 separate blood Cx one from each arm.  Discussed with provider - will resume vancomycin for now.     Name of physician (or Provider) Contacted: Katherina Right [facility])  Current antibiotics: Unasyn  Changes to prescribed antibiotics recommended:  Will resume vancomycin   Results for orders placed or performed during the hospital encounter of 10/10/20  Blood Culture ID Panel (Reflexed) (Collected: 10/10/2020  5:42 AM)  Result Value Ref Range   Enterococcus faecalis NOT DETECTED NOT DETECTED   Enterococcus Faecium NOT DETECTED NOT DETECTED   Listeria monocytogenes NOT DETECTED NOT DETECTED   Staphylococcus species DETECTED (A) NOT DETECTED   Staphylococcus aureus (BCID) NOT DETECTED NOT DETECTED   Staphylococcus epidermidis DETECTED (A) NOT DETECTED   Staphylococcus lugdunensis NOT DETECTED NOT DETECTED   Streptococcus species NOT DETECTED NOT DETECTED   Streptococcus agalactiae NOT DETECTED NOT DETECTED   Streptococcus pneumoniae NOT DETECTED NOT DETECTED   Streptococcus pyogenes NOT DETECTED NOT DETECTED   A.calcoaceticus-baumannii NOT DETECTED NOT DETECTED   Bacteroides fragilis NOT DETECTED NOT DETECTED   Enterobacterales NOT DETECTED NOT DETECTED   Enterobacter cloacae complex NOT DETECTED NOT DETECTED   Escherichia coli NOT DETECTED NOT DETECTED   Klebsiella aerogenes NOT DETECTED NOT DETECTED   Klebsiella oxytoca NOT DETECTED NOT DETECTED   Klebsiella pneumoniae NOT DETECTED NOT DETECTED   Proteus species NOT DETECTED NOT DETECTED   Salmonella species  NOT DETECTED NOT DETECTED   Serratia marcescens NOT DETECTED NOT DETECTED   Haemophilus influenzae NOT DETECTED NOT DETECTED   Neisseria meningitidis NOT DETECTED NOT DETECTED   Pseudomonas aeruginosa NOT DETECTED NOT DETECTED   Stenotrophomonas maltophilia NOT DETECTED NOT DETECTED   Candida albicans NOT DETECTED NOT DETECTED   Candida auris NOT DETECTED NOT DETECTED   Candida glabrata NOT DETECTED NOT DETECTED   Candida krusei NOT DETECTED NOT DETECTED   Candida parapsilosis NOT DETECTED NOT DETECTED   Candida tropicalis NOT DETECTED NOT DETECTED   Cryptococcus neoformans/gattii NOT DETECTED NOT DETECTED   Methicillin resistance mecA/C NOT DETECTED NOT DETECTED    Leota Sauers Pharm.D. CPP, BCPS Clinical Pharmacist [phone] 10/13/2020 8:06 PM

## 2020-10-13 NOTE — Interval H&P Note (Signed)
History and Physical Interval Note:  10/13/2020 8:59 AM  Charles Thompson  has presented today for surgery, with the diagnosis of CELLULITIS LEFT FOOT.  The various methods of treatment have been discussed with the patient and family. After consideration of risks, benefits and other options for treatment, the patient has consented to  Procedure(s): IRRIGATION AND DEBRIDEMENT FOOT (Left) as a surgical intervention.  The patient's history has been reviewed, patient examined, no change in status, stable for surgery.  I have reviewed the patient's chart and labs.  Questions were answered to the patient's satisfaction.     [name]

## 2020-10-13 NOTE — Anesthesia Procedure Notes (Signed)
Procedure Name: Intubation Date/Time: 10/13/2020 7:52 AM Performed by: Amadeo Garnet, CRNA Pre-anesthesia Checklist: Patient identified, Emergency Drugs available, Suction available and Patient being monitored Patient Re-evaluated:Patient Re-evaluated prior to induction Oxygen Delivery Method: Circle system utilized Preoxygenation: Pre-oxygenation with 100% oxygen Induction Type: IV induction and Rapid sequence Ventilation: Unable to mask ventilate Laryngoscope Size: Mac and 4 Grade View: Grade II Tube type: Oral Number of attempts: 1 Airway Equipment and Method: Stylet and Oral airway Placement Confirmation: ETT inserted through vocal cords under direct vision,  positive ETCO2 and breath sounds checked- equal and bilateral Secured at: 22 cm Tube secured with: Tape Dental Injury: Teeth and Oropharynx as per pre-operative assessment  Comments: DL x2, able to view partial glottic opening, ETT placed atraumatically

## 2020-10-13 NOTE — Op Note (Signed)
OPERATIVE REPORT Patient name: Charles Thompson MRN: [id number] DOB: 1961/05/21  DOS: 10/13/2020  Preop Dx: Status post incision and drainage left foot.  Abscess left foot Postop Dx: same  Procedure:  1.  Irrigation and debridement left foot 2.  Delayed primary closure left foot  Surgeon: [name] DPM  Anesthesia: 20 mL of 0.5% Marcaine plain left ankle block  Hemostasis: No tourniquet utilized  EBL: 100 mL Materials: None Injectables: None Pathology: Deep wound cultures taken and sent to pathology  Condition: The patient tolerated the procedure and anesthesia well. No complications noted or reported   Justification for procedure: The patient is a 59 y.o. male who presents today for surgical correction of abscess of the left foot status post incision and drainage. The patient was told benefits as well as possible side effects of the surgery. The patient consented for surgical correction. The patient consent form was reviewed. All patient questions were answered. No guarantees were expressed or implied. The patient and the surgeon both side the patient consent form with the witness present and placed in the patient's chart.   Procedure in Detail: The patient was brought to the operating room, placed in the operating table in the supine position at which time an aseptic scrub and drape were performed about the patient's respective lower extremity after anesthesia was induced as described above. Attention was then directed to the surgical area where procedure number one commenced.  Procedure #1: Irrigation and debridement left foot The retention staples that were applied during the previous surgery were removed prior to aseptic scrub and prep of the foot using Betadine scrub prep.  Initially, blunt soft tissue dissection was utilized to ensure that there are no remaining abscesses which had developed or remaining within the foot.  Negative for any significant purulence noted.   Any additional nonviable necrotic tissue including muscle and deep fascial tissue within the foot was sharply debrided away using a surgical #15 blade.  At this time pulse lavaged with 3 L of normal saline was utilized for copious irrigation of the foot wound.  After copious irrigation the foot wound was inspected and any additional necrotic debris was excisionally debrided using a surgical 15 scalpel.  Attention was then directed to the open incisions where procedure #2 commenced  Procedure #2: Delayed primary closure left foot The opposing ends of the incision site were reapproximated using 3-0 nylon suture.  Prior to closure the intraoperative decision was made to surgically excised the chronic foot wound to the distal portion of the foot stump.  This allowed primary closure of the foot wound.  The forefoot wound was circumscribed and excised using a surgical 15 scalpel.  Primary closure was then achieved using 3-0 nylon suture across the distal portion of the incision site as well as intermittently throughout the body of the incision site.  Remaining skin incision was primarily closed using wide stainless steel skin staples.  To portions of the incision site were left open for packing.  Half-inch iodoform packing was utilized.  Dry sterile compressive dressings were then applied to all previously mentioned incision sites about the patient's lower extremity. The patient was then transferred from the operating room to the recovery room having tolerated the procedure and anesthesia well. All vital signs are stable. After a brief stay in the recovery room the patient was readmitted to inpatient room with standing orders for analgesia.  Patient currently on antibiotic regimen.  Verbal as well as written instructions were provided for the  patient regarding wound care. The patient is to keep the dressings clean dry and intact until they are to follow surgeon Dr. Gala Lewandowsky in the office upon discharge.    [name], DPM [facility]  Dr. [name], DPM    [address] [place], [place] [address]                Office [phone]  Fax [phone]

## 2020-10-14 ENCOUNTER — Encounter (HOSPITAL_COMMUNITY): Payer: Self-pay | Admitting: Podiatry

## 2020-10-14 ENCOUNTER — Other Ambulatory Visit (HOSPITAL_COMMUNITY): Payer: Self-pay | Admitting: Student

## 2020-10-14 DIAGNOSIS — L0390 Cellulitis, unspecified: Secondary | ICD-10-CM

## 2020-10-14 DIAGNOSIS — E8809 Other disorders of plasma-protein metabolism, not elsewhere classified: Secondary | ICD-10-CM | POA: Diagnosis not present

## 2020-10-14 DIAGNOSIS — R7989 Other specified abnormal findings of blood chemistry: Secondary | ICD-10-CM

## 2020-10-14 DIAGNOSIS — Z720 Tobacco use: Secondary | ICD-10-CM | POA: Diagnosis not present

## 2020-10-14 DIAGNOSIS — R7881 Bacteremia: Secondary | ICD-10-CM

## 2020-10-14 DIAGNOSIS — B955 Unspecified streptococcus as the cause of diseases classified elsewhere: Secondary | ICD-10-CM

## 2020-10-14 DIAGNOSIS — E1140 Type 2 diabetes mellitus with diabetic neuropathy, unspecified: Secondary | ICD-10-CM | POA: Diagnosis not present

## 2020-10-14 DIAGNOSIS — L03116 Cellulitis of left lower limb: Secondary | ICD-10-CM | POA: Diagnosis not present

## 2020-10-14 DIAGNOSIS — E11621 Type 2 diabetes mellitus with foot ulcer: Secondary | ICD-10-CM | POA: Diagnosis not present

## 2020-10-14 LAB — RENAL FUNCTION PANEL
Albumin: 2.2 g/dL — ABNORMAL LOW (ref 3.5–5.0)
Anion gap: 13 (ref 5–15)
BUN: 12 mg/dL (ref 6–20)
CO2: 21 mmol/L — ABNORMAL LOW (ref 22–32)
Calcium: 8.1 mg/dL — ABNORMAL LOW (ref 8.9–10.3)
Chloride: 101 mmol/L (ref 98–111)
Creatinine, Ser: 0.96 mg/dL (ref 0.61–1.24)
GFR, Estimated: >60 mL/min (ref >60)
Glucose, Bld: 174 mg/dL — ABNORMAL HIGH (ref 70–99)
Phosphorus: 2 mg/dL — ABNORMAL LOW (ref 2.5–4.6)
Potassium: 4 mmol/L (ref 3.5–5.1)
Sodium: 135 mmol/L (ref 135–145)

## 2020-10-14 LAB — CBC
HCT: 31.2 % — ABNORMAL LOW (ref 39.0–52.0)
Hemoglobin: 10.4 g/dL — ABNORMAL LOW (ref 13.0–17.0)
MCH: 28.8 pg (ref 26.0–34.0)
MCHC: 33.3 g/dL (ref 30.0–36.0)
MCV: 86.4 fL (ref 80.0–100.0)
Platelets: 315 10*3/uL (ref 150–400)
RBC: 3.61 MIL/uL — ABNORMAL LOW (ref 4.22–5.81)
RDW: 12.7 % (ref 11.5–15.5)
WBC: 12.8 10*3/uL — ABNORMAL HIGH (ref 4.0–10.5)
nRBC: 0 % (ref 0.0–0.2)

## 2020-10-14 LAB — GLUCOSE, CAPILLARY
Glucose-Capillary: 126 mg/dL — ABNORMAL HIGH (ref 70–99)
Glucose-Capillary: 93 mg/dL (ref 70–99)
Glucose-Capillary: 146 mg/dL — ABNORMAL HIGH (ref 70–99)

## 2020-10-14 LAB — CULTURE, BLOOD (ROUTINE X 2): Special Requests: ADEQUATE

## 2020-10-14 LAB — MAGNESIUM: Magnesium: 2.1 mg/dL (ref 1.7–2.4)

## 2020-10-14 MED ORDER — NICOTINE 21 MG/24HR TD PT24: 21.0000 mg | MEDICATED_PATCH | Freq: Every day | TRANSDERMAL | 0 refills | Status: AC

## 2020-10-14 MED ORDER — LEVOFLOXACIN 750 MG PO TABS: 750.0000 mg | ORAL_TABLET | Freq: Every day | ORAL | 0 refills | Status: DC

## 2020-10-14 MED ORDER — LISINOPRIL-HYDROCHLOROTHIAZIDE 20-12.5 MG PO TABS: 1.0000 | ORAL_TABLET | Freq: Every day | ORAL | Status: AC

## 2020-10-14 MED ORDER — POTASSIUM PHOSPHATES 15 MMOLE/5ML IV SOLN
20.0000 mmol | Freq: Once | INTRAVENOUS | Status: AC
  Administered 2020-10-14: 20 mmol via INTRAVENOUS
  Filled 2020-10-14: qty 6.67

## 2020-10-14 MED ORDER — OXYCODONE-ACETAMINOPHEN 5-325 MG PO TABS
1.0000 | ORAL_TABLET | Freq: Four times a day (QID) | ORAL | 0 refills | Status: DC | PRN
Start: 2020-10-14 — End: 2020-10-16

## 2020-10-14 MED ORDER — VANCOMYCIN HCL IN DEXTROSE 1-5 GM/200ML-% IV SOLN
1000.0000 mg | Freq: Two times a day (BID) | INTRAVENOUS | Status: DC
  Filled 2020-10-14 (×2): qty 200

## 2020-10-14 MED FILL — levoFLOXacin 750 MG TABS: 750 | 10 days supply | Qty: 10 | Fill #0

## 2020-10-14 MED FILL — OXYCODONE-APAP 5-325MG: 5-325 | 5 days supply | Qty: 20 | Fill #0

## 2020-10-14 NOTE — TOC Initial Note (Signed)
Transition of Care [facility]) - Initial/Assessment Note    Patient Details  Name: Charles Thompson MRN: [id number] Date of Birth: Apr 23, 1961  [facility]) CM/SW Contact:    Kingsley Plan, RN Phone Number: 10/14/2020, 3:19 PM  Clinical Narrative:                 Confirmed face sheet information. Patient from home by himself. Has friends family to assist if needed. Patient has walker and 3 in1 at home already.   PT/OT referral given to [facility] with [facility] and accepted.  Expected Discharge Plan: Home w Home Health Services Barriers to Discharge: No Barriers Identified   Patient Goals and CMS Choice Patient states their goals for this hospitalization and ongoing recovery are:: to return to home CMS Medicare.gov Compare Post Acute Care list provided to:: Patient Choice offered to / list presented to : Patient  Expected Discharge Plan and Services Expected Discharge Plan: Home w Home Health Services     Post Acute Care Choice: Home Health Living arrangements for the past 2 months: Single Family Home Expected Discharge Date: 10/14/20               DME Arranged: N/A                    Prior Living Arrangements/Services Living arrangements for the past 2 months: Single Family Home Lives with:: Self Patient language and need for interpreter reviewed:: Yes Do you feel safe going back to the place where you live?: Yes      Need for Family Participation in Patient Care: Yes (Comment) Care giver support system in place?: Yes (comment) Current home services: DME Criminal Activity/Legal Involvement Pertinent to Current Situation/Hospitalization: No - Comment as needed  Activities of Daily Living Home Assistive Devices/Equipment: Dan Humphreys (specify type) ADL Screening (condition at time of admission) Patient's cognitive ability adequate to safely complete daily activities?: Yes Is the patient deaf or have difficulty hearing?: No Does the patient have difficulty seeing,  even when wearing glasses/contacts?: No Does the patient have difficulty concentrating, remembering, or making decisions?: No Patient able to express need for assistance with ADLs?: Yes Does the patient have difficulty dressing or bathing?: No Independently performs ADLs?: Yes (appropriate for developmental age) Does the patient have difficulty walking or climbing stairs?: Yes Weakness of Legs: Both Weakness of Arms/Hands: None  Permission Sought/Granted   Permission granted to share information with : No              Emotional Assessment Appearance:: Appears stated age Attitude/Demeanor/Rapport: Engaged Affect (typically observed): Accepting Orientation: : Oriented to Self, Oriented to Place, Oriented to  Time, Oriented to Situation Alcohol / Substance Use: Not Applicable Psych Involvement: No (comment)  Admission diagnosis:  Cellulitis [L03.90] Cellulitis of left lower extremity [L03.116] Patient Active Problem List   Diagnosis Date Noted  . Cellulitis 10/10/2020  . Hyponatremia 10/10/2020  . Obesity (BMI 30.0-34.9) 10/10/2020  . Hypoalbuminemia 10/10/2020  . Sepsis (HCC) 10/10/2020  . AKI (acute kidney injury) (HCC) 10/10/2020  . Insomnia secondary to restless leg syndrome 07/31/2020  . Right lumbar radiculopathy 06/20/2019  . Major depressive disorder, recurrent episode, in remission with seasonal pattern (HCC) 02/15/2019  . History of transmetatarsal amputation of left foot (HCC) 10/19/2018  . Osteomyelitis of second toe of right foot (HCC)   . Diabetic foot infection (HCC) 06/20/2018  . Diabetic ulcer of left foot associated with type 2 diabetes mellitus (HCC)   . Tobacco abuse 12/02/2017  .  Status post amputation of right great toe (HCC) 10/29/2016  . Type 2 diabetes mellitus with diabetic neuropathy, with long-term current use of insulin (HCC) 12/05/2015  . Skin ulcer of left foot with fat layer exposed (HCC) 10/31/2015   PCP:  Gaspar Garbe, MD Pharmacy:    [facility] [phone] [place], [place] - [address] AT [facility] [address] [facility] [address] [place] [address] Phone: [phone] Fax: [phone]  [facility] [place], [place] - [address] [place] [address] Phone: [phone] Fax: [phone]     Social Determinants of Health (SDOH) Interventions    Readmission Risk Interventions No flowsheet data found.

## 2020-10-14 NOTE — Progress Notes (Signed)
[facility]  Date of Admission:  10/10/2020           Reason for visit: Follow up on diabetic foot infection  ASSESSMENT:    # Diabetic foot infection s/p I&D left foot abscess 10/10/20 and 11/28 with delayed primary wound closure: cultures with Strep intermedius from blood and abscess # Staph epi positive blood culture: likely a contaminant # DM # Tobacco use  PLAN:    -- discussed with patient at length regarding finding of Strep intermedius in abscess culture and blood culture.  Discussed IV antibiotics and ideally TTE and repeat blood cultures.  Patient adamant about discharge home today with close podiatry follow up.  He is not interested in home IV antibiotics.  Also discussed with Podiatry who did not feel there was bone involvement but strictly soft tissue -- will plan for 2 weeks of antibiotic therapy with Levaquin at discharge due to above issues.  Return precautions given -- wound care -- glycemic control   MEDICATIONS:    Scheduled Meds: . amLODipine  10 mg Oral Daily  . aspirin EC  81 mg Oral Daily  . atorvastatin  10 mg Oral Daily  . Chlorhexidine Gluconate Cloth  6 each Topical Q0600  . clonazePAM  1 mg Oral QHS  . enoxaparin (LOVENOX) injection  50 mg Subcutaneous Q24H  . gabapentin  300 mg Oral BID  . insulin aspart  0-15 Units Subcutaneous TID WC  . insulin aspart  0-5 Units Subcutaneous QHS  . insulin aspart  4 Units Subcutaneous TID WC  . mupirocin ointment  1 application Nasal BID  . nicotine  21 mg Transdermal Daily  . pantoprazole  40 mg Oral Daily  . sertraline  150 mg Oral Daily  . sodium chloride flush  3 mL Intravenous Q12H    Continuous Infusions: . sodium chloride    . potassium PHOSPHATE IVPB (in mmol) 20 mmol (10/14/20 1250)  . vancomycin      PRN Meds: sodium chloride, acetaminophen, albuterol, HYDROmorphone (DILAUDID) injection, ondansetron **OR** ondansetron (ZOFRAN) IV, oxyCODONE-acetaminophen,  promethazine, sodium chloride flush  SUBJECTIVE:   Patient with pain controlled.  No fevers, chills.  Tolerating abx.  Wants to go home today.   Review of Systems: As noted above.  All other systems reviewed and are negative.   OBJECTIVE:   No Known Allergies  Blood pressure (!) 149/71, pulse 88, temperature 98.7 F (37.1 C), temperature source Oral, resp. rate 20, height 6' (1.829 m), weight 106.6 kg, SpO2 98 %. Body mass index is 31.87 kg/m.  Physical Exam Constitutional:      Appearance: Normal appearance.  HENT:     Head: Normocephalic and atraumatic.  Eyes:     Extraocular Movements: Extraocular movements intact.     Conjunctiva/sclera: Conjunctivae normal.  Pulmonary:     Effort: Pulmonary effort is normal. No respiratory distress.  Musculoskeletal:     Comments: Left foot wrapped heavily with ACE wrap.   Neurological:     General: No focal deficit present.     Mental Status: He is alert and oriented to person, place, and time.  Psychiatric:        Mood and Affect: Mood normal.        Behavior: Behavior normal.        Lab Results & Microbiology Lab Results  Component Value Date   WBC 12.8 (H) 10/14/2020   HGB 10.4 (L) 10/14/2020   HCT 31.2 (L) 10/14/2020  MCV 86.4 10/14/2020   PLT 315 10/14/2020    Lab Results  Component Value Date   NA 135 10/14/2020   K 4.0 10/14/2020   CO2 21 (L) 10/14/2020   GLUCOSE 174 (H) 10/14/2020   BUN 12 10/14/2020   CREATININE 0.96 10/14/2020   CALCIUM 8.1 (L) 10/14/2020   GFRNONAA >60 10/14/2020   GFRAA >60 03/15/2020    Lab Results  Component Value Date   ALT 20 10/11/2020   AST 17 10/11/2020   ALKPHOS 119 10/11/2020   BILITOT 1.5 (H) 10/11/2020     I have reviewed the micro and lab results in Epic.  Imaging DG MINI C-ARM IMAGE ONLY  Result Date: 10/13/2020 There is no interpretation for this exam.  This order is for images obtained during a surgical procedure.  Please See "Surgeries" Tab for more  information regarding the procedure.    I spent greater than 35 minutes with the patient including greater than 50% of time in face to face counsel of the patient and/or in coordination of their care.    Vedia Coffer for Infectious Disease [facility] [phone] pager 10/14/2020, 3:15 PM

## 2020-10-14 NOTE — Progress Notes (Signed)
Nsg Discharge Note  Admit Date:  10/10/2020 Discharge date: 10/14/2020   Charles Thompson to be D/C'd home with [facility] per MD order.  AVS completed. Patient/caregiver able to verbalize understanding.  Discharge Medication: Allergies as of 10/14/2020   No Known Allergies     Medication List    STOP taking these medications   doxycycline 100 MG tablet Commonly known as: VIBRA-TABS     TAKE these medications   amLODipine 10 MG tablet Commonly known as: NORVASC Take 10 mg by mouth daily.   aspirin 81 MG tablet Take 81 mg by mouth daily.   atorvastatin 10 MG tablet Commonly known as: LIPITOR Take 10 mg by mouth daily.   clonazePAM 1 MG tablet Commonly known as: KlonoPIN Take 1 tablet (1 mg total) by mouth at bedtime.   fluticasone 50 MCG/ACT nasal spray Commonly known as: FLONASE Place 1 spray into both nostrils 2 (two) times daily.   gabapentin 300 MG capsule Commonly known as: NEURONTIN Take 300 mg by mouth 2 (two) times daily.   levofloxacin 750 MG tablet Commonly known as: Levaquin Take 1 tablet (750 mg total) by mouth daily for 10 days.   lisinopril-hydrochlorothiazide 20-12.5 MG tablet Commonly known as: ZESTORETIC Take 1 tablet by mouth daily. What changed: when to take this   nicotine 21 mg/24hr patch Commonly known as: NICODERM CQ - dosed in mg/24 hours Place 1 patch (21 mg total) onto the skin daily. Start taking on: October 15, 2020   omeprazole 20 MG capsule Commonly known as: PRILOSEC Take 20 mg by mouth daily as needed (heartburn).   OneTouch Delica Plus Lancet33G Misc USE TO TEST BLOOD SUGAR THREE TIMES DAILY   OneTouch Verio test strip Generic drug: glucose blood USE TO TEST BLOOD SUGAR THREE TIMES DAILY   oxyCODONE-acetaminophen 5-325 MG tablet Commonly known as: Percocet Take 1 tablet by mouth every 6 (six) hours as needed for up to 5 days for severe pain. What changed: when to take this   sertraline 100 MG tablet Commonly known as:  ZOLOFT TAKE 1 AND 1/2 TABLETS(150 MG) BY MOUTH DAILY   Synjardy 12.03-999 MG Tabs Generic drug: Empagliflozin-metFORMIN HCl   Toujeo Max SoloStar 300 UNIT/ML Solostar Pen Generic drug: insulin glargine (2 Unit Dial) Inject 70 Units into the skin 2 (two) times daily.            Durable Medical Equipment  (From admission, onward)         Start     Ordered   10/14/20 1303  For home use only DME Bedside commode  Once       Question:  Patient needs a bedside commode to treat with the following condition  Answer:  Unsteady gait   10/14/20 1302   10/14/20 1302  For home use only DME Walker rolling  Once       Question Answer Comment  Walker: With 5 Inch Wheels   Patient needs a walker to treat with the following condition Unsteady gait      10/14/20 1302           Discharge Care Instructions  (From admission, onward)         Start     Ordered   10/14/20 0000  Discharge wound care:       Comments: Keep dressing intact clean and dry as recommended to you by your podiatrist.   10/14/20 1353          Discharge Assessment: Vitals:   10/14/20 0557  10/14/20 1458  BP: 113/61 (!) 149/71  Pulse: 78 88  Resp: 18 20  Temp: 97.8 F (36.6 C) 98.7 F (37.1 C)  SpO2: 95% 98%   Skin clean, dry and intact without evidence of skin break down, no evidence of skin tears noted. IV catheter discontinued intact. Site without signs and symptoms of complications - no redness or edema noted at insertion site, patient denies c/o pain - only slight tenderness at site.  Dressing with slight pressure applied.  D/c Instructions-Education: Discharge instructions given to patient/family with verbalized understanding. D/c education completed with patient/family including follow up instructions, medication list, d/c activities limitations if indicated, with other d/c instructions as indicated by MD - patient able to verbalize understanding, all questions fully answered. Patient instructed to  return to ED, call 911, or call MD for any changes in condition.  Patient escorted via WC, and D/C home via private auto.  Kizzie Bane, RN 10/14/2020 6:18 PM

## 2020-10-14 NOTE — Progress Notes (Signed)
PROGRESS NOTE  Charles Thompson NTZ:[id number] DOB: 07-25-1961   PCP: Haywood Pao, MD  Patient is from: Home  DOA: 10/10/2020 LOS: 4  Chief complaints: Left foot.  Brief Narrative / Interim history: 59 year old male with history of DM-2, neuropathy s/p left TMT amputations, HTN, HLD, anxiety, depression and tobacco use disorder presenting with progressive left foot pain and ulcer with drainage, and admitted for sepsis due to left foot cellulitis and diabetic wound infection.    Left foot x-ray with focal soft tissue gas in the superficial subcutaneous tissues over the resection stump at the level of the second metatarsal but no evidence of osteomyelitis. He was a started on vancomycin, cefepime and Flagyl.  Podiatry consulted.    Patient underwent incision and drainage of left foot by Dr. Amalia Hailey on 10/10/2020.  Blood cultures NGTD.  Surgical tissue culture with few Streptococcus intermedius.  Infectious disease consulted.  De-escalated antibiotic to IV Unasyn.  I&D on 10/13/2020 with delayed primary closure of left foot wound.  Deep wound cultures NGTD.  Pathology pending.  Blood culture with Staph epidermidis   Subjective: Seen and examined this afternoon after he returned from surgery.  No complaints but he would like to go home tomorrow.  He says he signed himself pain and will sign himself out if he does not get discharged tomorrow.  Denies pain, shortness of breath, GI or UTI symptoms.  Objective: Vitals:   10/13/20 1405 10/13/20 1711 10/13/20 2141 10/14/20 0557  BP: (!) 97/46 126/73 123/63 113/61  Pulse: 65 87 66 78  Resp: _0 Temp: 98.4 F (36.9 C) 97.7 F (36.5 C) 97.6 F (36.4 C) 97.8 F (36.6 C)  TempSrc: Axillary Oral Oral Axillary  SpO2: 97% 96% 96% 95%  Weight:      Height:        Intake/Output Summary (Last 24 hours) at 10/14/2020 1436 Last data filed at 10/14/2020 0800 Gross per 24 hour  Intake 1189.42 ml  Output 1500 ml  Net -310.58 ml    Filed Weights   10/10/20 0523 10/10/20 1000  Weight: 104.3 kg 106.6 kg    Examination:  GENERAL: No apparent distress.  Nontoxic. HEENT: MMM.  Vision and hearing grossly intact.  NECK: Supple.  No apparent JVD.  RESP:  No IWOB.  Fair aeration bilaterally. CVS:  RRR. Heart sounds normal.  ABD/GI/GU: BS+. Abd soft, NTND.  MSK/EXT:  Moves extremities.  S/p TMT (old) and I&D of left foot. SKIN: Ace wrap and dressing over left foot.  No erythema or swelling proximally. NEURO: Awake, alert and oriented appropriately.  No apparent focal neuro deficit. PSYCH: Calm. Normal affect.    Procedures:  10/10/2020-I&D of left foot diabetic wound and abscess 10/13/2020-I&D of left foot with delayed primary closure  Microbiology summarized: 11/24 COVID-19 and influenza PCR nonreactive. 11/24 blood culture NGTD 11/25 surgical tissue culture Streptococcus intermedius. 11/28 surgical tissue culture and pathology pending.   Assessment & Plan: Sepsis due to purulent cellulitis and diabetic foot ulcer of left foot: POA.  Met criteria with tachycardia and leukocytosis.  Left foot x-ray with focal soft tissue gas in the superficial subcutaneous tissues over the resection stump at the level of the second metatarsal but no evidence of osteomyelitis.  Still with leukocytosis.  Normal ABI in 06/2018.  Culture data as above.  Leukocytosis improved. -Status post I&D of left foot on 11/25 and 11/28 with delayed primary wound closure -Per podiatry, plan for return to the OR on 11/28 for I&D -  ID consulted-de-escalated antibiotic to IV Unasyn -Percocet 5/325 mg every 6 hours as needed severe pain, #20.  Narcotic database reviewed. -Encouraged tobacco cessation -Trend leukocytosis-improving. -Follow surgical wound culture  Hyponatremia: Na 128>> 134>136.  Resolved.  Controlled DM-2 with hyperglycemia, neuropathy and diabetic foot ulcer: A1c 5.9%.:  Toujeo 70 units twice daily and Synjardy 10-999  mg. Recent Labs  Lab 10/13/20 1154 10/13/20 1706 10/13/20 2142 10/14/20 0815 10/14/20 1222  GLUCAP 217* 174* 131* 126* 93  -Continue SSI-moderate.  Add nightly and mealtime coverage. -Continue gabapentin and a statin  Anion gap metabolic acidosis: Unclear etiology.Due to sepsis?  Improved. -Continue to monitor  Essential hypertension: Soft BP this afternoon. -Received amlodipine and lisinopril today.  Will hold in the morning if hypotensive. -NS bolus 500 cc.  Hypophosphatemia/hypomagnesemia -Replenish and recheck.  Anxiety: Home medications include sertraline 150 mg daily and clonazepam 1 mg nightly. -Continue home Zoloft and Klonopin  Hyperlipidemia:  -Continue statin  Tobacco use disorder: Reports smoking about a pack a day. -Encouraged tobacco cessation -Nicotine patch  Insomnia: Improved. -Continue home Klonopin at night  Obesity/hypoalbuminemia Body mass index is 31.87 kg/m.         DVT prophylaxis:  SCDs Start: 10/10/20 1648  Code Status: Full code Family Communication: Patient and/or RN. Available if any question.  Status is: Inpatient  Remains inpatient appropriate because:Ongoing diagnostic testing needed not appropriate for outpatient work up and Inpatient level of care appropriate due to severity of illness   Dispo: The patient is from: Home              Anticipated d/c is to: Home              Anticipated d/c date is: 2 days              Patient currently is not medically stable to d/c.       Consultants:  Podiatry Infectious disease   Sch Meds:  Scheduled Meds: . amLODipine  10 mg Oral Daily  . aspirin EC  81 mg Oral Daily  . atorvastatin  10 mg Oral Daily  . Chlorhexidine Gluconate Cloth  6 each Topical Q0600  . clonazePAM  1 mg Oral QHS  . enoxaparin (LOVENOX) injection  50 mg Subcutaneous Q24H  . gabapentin  300 mg Oral BID  . insulin aspart  0-15 Units Subcutaneous TID WC  . insulin aspart  0-5 Units Subcutaneous QHS   . insulin aspart  4 Units Subcutaneous TID WC  . mupirocin ointment  1 application Nasal BID  . nicotine  21 mg Transdermal Daily  . pantoprazole  40 mg Oral Daily  . sertraline  150 mg Oral Daily  . sodium chloride flush  3 mL Intravenous Q12H   Continuous Infusions: . sodium chloride    . potassium PHOSPHATE IVPB (in mmol) 20 mmol (10/14/20 1250)  . vancomycin     PRN Meds:.sodium chloride, acetaminophen, albuterol, HYDROmorphone (DILAUDID) injection, ondansetron **OR** ondansetron (ZOFRAN) IV, oxyCODONE-acetaminophen, promethazine, sodium chloride flush  Antimicrobials: Anti-infectives (From admission, onward)   Start     Dose/Rate Route Frequency Ordered Stop   10/14/20 2000  vancomycin (VANCOCIN) IVPB 1000 mg/200 mL premix        1,000 mg 200 mL/hr over 60 Minutes Intravenous Every 12 hours 10/14/20 1049     10/14/20 1000  vancomycin (VANCOREADY) IVPB 750 mg/150 mL  Status:  Discontinued        750 mg 150 mL/hr over 60 Minutes Intravenous Every 12 hours 10/13/20   1957 10/14/20 1049   10/14/20 0000  levofloxacin (LEVAQUIN) 750 MG tablet        750 mg Oral Daily 10/14/20 1344 10/24/20 2359   10/13/20 2045  vancomycin (VANCOREADY) IVPB 2000 mg/400 mL        2,000 mg 200 mL/hr over 120 Minutes Intravenous  Once 10/13/20 1957 10/13/20 2328   10/12/20 1400  Ampicillin-Sulbactam (UNASYN) 3 g in sodium chloride 0.9 % 100 mL IVPB  Status:  Discontinued        3 g 200 mL/hr over 30 Minutes Intravenous Every 6 hours 10/12/20 1159 10/13/20 1957   10/10/20 1800  vancomycin (VANCOREADY) IVPB 750 mg/150 mL  Status:  Discontinued        750 mg 150 mL/hr over 60 Minutes Intravenous Every 12 hours 10/10/20 0546 10/12/20 1150   10/10/20 1400  ceFEPIme (MAXIPIME) 2 g in sodium chloride 0.9 % 100 mL IVPB  Status:  Discontinued        2 g 200 mL/hr over 30 Minutes Intravenous Every 8 hours 10/10/20 0543 10/12/20 1150   10/10/20 1358  metroNIDAZOLE (FLAGYL) 5-0.79 MG/ML-% IVPB       Note to  Pharmacy: Schaefer, Brittany  : cabinet override      10/10/20 1358 10/10/20 1435   10/10/20 1330  metroNIDAZOLE (FLAGYL) tablet 500 mg  Status:  Discontinued        500 mg Oral Every 8 hours 10/10/20 0737 10/12/20 1150   10/10/20 0600  vancomycin (VANCOREADY) IVPB 2000 mg/400 mL        2,000 mg 200 mL/hr over 120 Minutes Intravenous  Once 10/10/20 0542 10/10/20 0839   10/10/20 0530  ceFEPIme (MAXIPIME) 2 g in sodium chloride 0.9 % 100 mL IVPB        2 g 200 mL/hr over 30 Minutes Intravenous  Once 10/10/20 0520 10/10/20 0622   10/10/20 0530  metroNIDAZOLE (FLAGYL) IVPB 500 mg        500 mg 100 mL/hr over 60 Minutes Intravenous  Once 10/10/20 0520 10/10/20 0652   10/10/20 0530  vancomycin (VANCOCIN) IVPB 1000 mg/200 mL premix  Status:  Discontinued        1,000 mg 200 mL/hr over 60 Minutes Intravenous  Once 10/10/20 0520 10/10/20 0542       I have personally reviewed the following labs and images: CBC: Recent Labs  Lab 10/10/20 0244 10/11/20 0227 10/12/20 0121 10/13/20 0146 10/14/20 0301  WBC 22.6* 23.3* 17.4* 11.8* 12.8*  NEUTROABS 18.6*  --  14.3* 9.2*  --   HGB 14.0 12.9* 11.7* 11.7* 10.4*  HCT 41.6 39.2 35.4* 34.8* 31.2*  MCV 86.8 88.1 87.0 86.6 86.4  PLT 375 352 342 330 315   BMP &GFR Recent Labs  Lab 10/10/20 0244 10/11/20 0227 10/12/20 0121 10/13/20 0146 10/14/20 0301  NA 128* 134* 136 136 135  K 3.5 4.1 4.3 4.0 4.0  CL 92* 98 101 101 101  CO2 20* 16* 19* 20* 21*  GLUCOSE 105* 147* 129* 132* 174*  BUN 24* 24* 20 13 12  CREATININE 1.47* 1.29* 1.19 0.98 0.96  CALCIUM 9.0 8.9 8.9 8.4* 8.1*  MG  --   --  1.9 1.7 2.1  PHOS  --   --  2.2* 2.1* 2.0*   Estimated Creatinine Clearance: 103.2 mL/min (by C-G formula based on SCr of 0.96 mg/dL). Liver & Pancreas: Recent Labs  Lab 10/10/20 0244 10/11/20 0227 10/12/20 0121 10/13/20 0146 10/14/20 0301  AST 17 17  --   --   --     ALT 23 20  --   --   --   ALKPHOS 112 119  --   --   --   BILITOT 1.2 1.5*  --   --    --   PROT 7.4 6.6  --   --   --   ALBUMIN 3.3* 2.8* 2.5* 2.3* 2.2*   No results for input(s): LIPASE, AMYLASE in the last 168 hours. No results for input(s): AMMONIA in the last 168 hours. Diabetic: No results for input(s): HGBA1C in the last 72 hours. Recent Labs  Lab 10/13/20 1154 10/13/20 1706 10/13/20 2142 10/14/20 0815 10/14/20 1222  GLUCAP 217* 174* 131* 126* 93   Cardiac Enzymes: No results for input(s): CKTOTAL, CKMB, CKMBINDEX, TROPONINI in the last 168 hours. No results for input(s): PROBNP in the last 8760 hours. Coagulation Profile: Recent Labs  Lab 10/10/20 0600  INR 1.1   Thyroid Function Tests: No results for input(s): TSH, T4TOTAL, FREET4, T3FREE, THYROIDAB in the last 72 hours. Lipid Profile: No results for input(s): CHOL, HDL, LDLCALC, TRIG, CHOLHDL, LDLDIRECT in the last 72 hours. Anemia Panel: No results for input(s): VITAMINB12, FOLATE, FERRITIN, TIBC, IRON, RETICCTPCT in the last 72 hours. Urine analysis:    Component Value Date/Time   COLORURINE YELLOW 12/18/2007 1926   APPEARANCEUR CLEAR 12/18/2007 1926   LABSPEC 1.040 (H) 12/18/2007 1926   PHURINE 6.5 12/18/2007 1926   GLUCOSEU >1000 (A) 12/18/2007 1926   HGBUR NEGATIVE 12/18/2007 1926   HGBUR NEGATIVE POINT OF CARE RESULT 12/18/2007 1926   BILIRUBINUR NEGATIVE 12/18/2007 1926   KETONESUR 40 (A) 12/18/2007 1926   PROTEINUR NEGATIVE 12/18/2007 1926   UROBILINOGEN 1.0 12/18/2007 1926   NITRITE NEGATIVE 12/18/2007 1926   LEUKOCYTESUR NEGATIVE 12/18/2007 1926   Sepsis Labs: Invalid input(s): PROCALCITONIN, LACTICIDVEN  Microbiology: Recent Results (from the past 240 hour(s))  Blood Culture (routine x 2)     Status: Abnormal   Collection Time: 10/10/20  5:42 AM   Specimen: BLOOD  Result Value Ref Range Status   Specimen Description BLOOD LEFT ANTECUBITAL  Final   Special Requests   Final    BOTTLES DRAWN AEROBIC AND ANAEROBIC Blood Culture adequate volume   Culture  Setup Time   Final     GRAM POSITIVE COCCI IN CLUSTERS AEROBIC BOTTLE ONLY Organism ID to follow CRITICAL RESULT CALLED TO, READ BACK BY AND VERIFIED WITH: PHARMD L CURRAN [id number] 1939 MLM    Culture (A)  Final    STAPHYLOCOCCUS EPIDERMIDIS THE SIGNIFICANCE OF ISOLATING THIS ORGANISM FROM A SINGLE SET OF BLOOD CULTURES WHEN MULTIPLE SETS ARE DRAWN IS UNCERTAIN. PLEASE NOTIFY THE MICROBIOLOGY DEPARTMENT WITHIN ONE WEEK IF SPECIATION AND SENSITIVITIES ARE REQUIRED. Performed at [facility], [address] [place], [place] [address]    Report Status 10/14/2020 FINAL  Final  Blood Culture (routine x 2)     Status: Abnormal (Preliminary result)   Collection Time: 10/10/20  5:42 AM   Specimen: BLOOD  Result Value Ref Range Status   Specimen Description BLOOD WRIST RIGHT  Final   Special Requests   Final    BOTTLES DRAWN AEROBIC AND ANAEROBIC Blood Culture results may not be optimal due to an inadequate volume of blood received in culture bottles   Culture  Setup Time   Final    GRAM POSITIVE COCCI IN CHAINS ANAEROBIC BOTTLE ONLY CRITICAL VALUE NOTED.  VALUE IS CONSISTENT WITH PREVIOUSLY REPORTED AND CALLED VALUE.    Culture (A)  Final    STREPTOCOCCUS   INTERMEDIUS SUSCEPTIBILITIES TO FOLLOW Performed at [facility], [address] [place], [place] [address]    Report Status PENDING  Incomplete  Blood Culture ID Panel (Reflexed)     Status: Abnormal   Collection Time: 10/10/20  5:42 AM  Result Value Ref Range Status   Enterococcus faecalis NOT DETECTED NOT DETECTED Final   Enterococcus Faecium NOT DETECTED NOT DETECTED Final   Listeria monocytogenes NOT DETECTED NOT DETECTED Final   Staphylococcus species DETECTED (A) NOT DETECTED Final    Comment: CRITICAL RESULT CALLED TO, READ BACK BY AND VERIFIED WITH: PHARMD L CURRAN [id number] 1939 MLM    Staphylococcus aureus (BCID) NOT DETECTED NOT DETECTED Final   Staphylococcus epidermidis DETECTED (A) NOT DETECTED Final    Comment: CRITICAL RESULT  CALLED TO, READ BACK BY AND VERIFIED WITH: PHARMD L CURRAN [id number] 1939 MLM    Staphylococcus lugdunensis NOT DETECTED NOT DETECTED Final   Streptococcus species NOT DETECTED NOT DETECTED Final   Streptococcus agalactiae NOT DETECTED NOT DETECTED Final   Streptococcus pneumoniae NOT DETECTED NOT DETECTED Final   Streptococcus pyogenes NOT DETECTED NOT DETECTED Final   A.calcoaceticus-baumannii NOT DETECTED NOT DETECTED Final   Bacteroides fragilis NOT DETECTED NOT DETECTED Final   Enterobacterales NOT DETECTED NOT DETECTED Final   Enterobacter cloacae complex NOT DETECTED NOT DETECTED Final   Escherichia coli NOT DETECTED NOT DETECTED Final   Klebsiella aerogenes NOT DETECTED NOT DETECTED Final   Klebsiella oxytoca NOT DETECTED NOT DETECTED Final   Klebsiella pneumoniae NOT DETECTED NOT DETECTED Final   Proteus species NOT DETECTED NOT DETECTED Final   Salmonella species NOT DETECTED NOT DETECTED Final   Serratia marcescens NOT DETECTED NOT DETECTED Final   Haemophilus influenzae NOT DETECTED NOT DETECTED Final   Neisseria meningitidis NOT DETECTED NOT DETECTED Final   Pseudomonas aeruginosa NOT DETECTED NOT DETECTED Final   Stenotrophomonas maltophilia NOT DETECTED NOT DETECTED Final   Candida albicans NOT DETECTED NOT DETECTED Final   Candida auris NOT DETECTED NOT DETECTED Final   Candida glabrata NOT DETECTED NOT DETECTED Final   Candida krusei NOT DETECTED NOT DETECTED Final   Candida parapsilosis NOT DETECTED NOT DETECTED Final   Candida tropicalis NOT DETECTED NOT DETECTED Final   Cryptococcus neoformans/gattii NOT DETECTED NOT DETECTED Final   Methicillin resistance mecA/C NOT DETECTED NOT DETECTED Final    Comment: Performed at [facility], [address] [place], [place] [address]  Resp Panel by RT-PCR (Flu A&B, Covid) Nasopharyngeal Swab     Status: None   Collection Time: 10/10/20  5:57 AM   Specimen: Nasopharyngeal Swab; Nasopharyngeal(NP) swabs in vial transport  medium  Result Value Ref Range Status   SARS Coronavirus 2 by RT PCR NEGATIVE NEGATIVE Final    Comment: (NOTE) SARS-CoV-2 target nucleic acids are NOT DETECTED.  The SARS-CoV-2 RNA is generally detectable in upper respiratory specimens during the acute phase of infection. The lowest concentration of SARS-CoV-2 viral copies this assay can detect is 138 copies/mL. A negative result does not preclude SARS-Cov-2 infection and should not be used as the sole basis for treatment or other patient management decisions. A negative result may occur with  improper specimen collection/handling, submission of specimen other than nasopharyngeal swab, presence of viral mutation(s) within the areas targeted by this assay, and inadequate number of viral copies(<138 copies/mL). A negative result must be combined with clinical observations, patient history, and epidemiological information. The expected result is Negative.  Fact Sheet for Patients:  https://www.fda.gov/media/152166/download    Fact Sheet for Healthcare Providers:  https://www.fda.gov/media/152162/download  This test is no t yet approved or cleared by the [place] FDA and  has been authorized for detection and/or diagnosis of SARS-CoV-2 by FDA under an Emergency Use Authorization (EUA). This EUA will remain  in effect (meaning this test can be used) for the duration of the COVID-19 declaration under Section 564(b)(1) of the Act, 21 U.S.C.section 360bbb-3(b)(1), unless the authorization is terminated  or revoked sooner.       Influenza A by PCR NEGATIVE NEGATIVE Final   Influenza B by PCR NEGATIVE NEGATIVE Final    Comment: (NOTE) The Xpert Xpress SARS-CoV-2/FLU/RSV plus assay is intended as an aid in the diagnosis of influenza from Nasopharyngeal swab specimens and should not be used as a sole basis for treatment. Nasal washings and aspirates are unacceptable for Xpert Xpress SARS-CoV-2/FLU/RSV testing.  Fact Sheet for  Patients: https://www.fda.gov/media/152166/download  Fact Sheet for Healthcare Providers: https://www.fda.gov/media/152162/download  This test is not yet approved or cleared by the [place] FDA and has been authorized for detection and/or diagnosis of SARS-CoV-2 by FDA under an Emergency Use Authorization (EUA). This EUA will remain in effect (meaning this test can be used) for the duration of the COVID-19 declaration under Section 564(b)(1) of the Act, 21 U.S.C. section 360bbb-3(b)(1), unless the authorization is terminated or revoked.  Performed at [facility], [address] [place], [place] [address]   Surgical pcr screen     Status: Abnormal   Collection Time: 10/10/20 11:11 AM   Specimen: Nasal Mucosa; Nasal Swab  Result Value Ref Range Status   MRSA, PCR NEGATIVE NEGATIVE Final   Staphylococcus aureus POSITIVE (A) NEGATIVE Final    Comment: (NOTE) The Xpert SA Assay (FDA approved for NASAL specimens in patients 22 years of age and older), is one component of a comprehensive surveillance program. It is not intended to diagnose infection nor to guide or monitor treatment. Performed at [facility], [address] [place], [place] [address]   Aerobic/Anaerobic Culture (surgical/deep wound)     Status: None (Preliminary result)   Collection Time: 10/10/20  2:23 PM   Specimen: Abscess; Wound  Result Value Ref Range Status   Specimen Description ABSCESS LEFT FOOT  Final   Special Requests PT ON CEFEPIME  Final   Gram Stain   Final    RARE WBC PRESENT,BOTH PMN AND MONONUCLEAR FEW GRAM POSITIVE COCCI IN PAIRS Performed at [facility], [address] [place], [place] [address]    Culture   Final    FEW STREPTOCOCCUS INTERMEDIUS NO ANAEROBES ISOLATED; CULTURE IN PROGRESS FOR 5 DAYS    Report Status PENDING  Incomplete  Aerobic/Anaerobic Culture (surgical/deep wound)     Status: None (Preliminary result)   Collection Time: 10/13/20  8:13 AM    Specimen: PATH Other; Tissue  Result Value Ref Range Status   Specimen Description ABSCESS LEFT FOOT  Final   Special Requests SPEC A LEFT FOOT ABSCESS  Final   Gram Stain NO WBC SEEN NO ORGANISMS SEEN   Final   Culture   Final    NO GROWTH < 24 HOURS Performed at [facility], [address] [place], [place] [address]    Report Status PENDING  Incomplete    Radiology Studies: No results found.    Taye T. [facility]  If 7PM-7AM, please contact night-coverage [email or web address] 10/14/2020, 2:36 PM  

## 2020-10-14 NOTE — Evaluation (Signed)
Physical Therapy Evaluation Patient Details Name: Charles Thompson MRN: [id number] DOB: 15-Aug-1961 Today's Date: 10/14/2020   History of Present Illness  59 y.o. male PMHx diabetes mellitus type 2 with peripheral neuropathy, s/p amputations, hypertension, hyperlipidemia, anxiety, depression, and tobacco abuse presents with complaints of severe left foot pain. Pt diagnosed with cellulitis and underwent I&D on 11/28.     Clinical Impression  Patient reports mod pain. Requests pain medicine. He is independent with bed mobility. Supervision for transfers and gait of 6 feet. NWB on left with RW. He requires cues for safety with transfers. Patient will continue to benefit from skilled PT while here to improve functional independence and safety.      Follow Up Recommendations Home health PT    Equipment Recommendations  None recommended by PT    Recommendations for Other Services       Precautions / Restrictions Precautions Precautions: Fall Precaution Comments: mod fall Restrictions Weight Bearing Restrictions: Yes LLE Weight Bearing: Non weight bearing Other Position/Activity Restrictions: Per podiatry, pt should ideally be NWB, but can minimally weightbear for mobility to bathroom      Mobility  Bed Mobility Overal bed mobility: Modified Independent Bed Mobility: Supine to Sit;Sit to Supine     Supine to sit: Modified independent (Device/Increase time) Sit to supine: Modified independent (Device/Increase time)   General bed mobility comments: Supervision to ensure safety of L foot, no assist needed    Transfers Overall transfer level: Needs assistance Equipment used: Rolling walker (2 wheeled) Transfers: Sit to/from Stand Sit to Stand: Supervision         General transfer comment: Patient states "i can do it", pulls up on awalker, requires cues to get all the way backed up to bed prior to sitting.  Ambulation/Gait Ambulation/Gait assistance: Supervision Gait  Distance (Feet): 6 Feet Assistive device: Rolling walker (2 wheeled) Gait Pattern/deviations: Step-to pattern Gait velocity: decr   General Gait Details: patient able to hop 6 feet with left NWB.  Stairs            Wheelchair Mobility    Modified Rankin (Stroke Patients Only)       Balance Overall balance assessment: Needs assistance Sitting-balance support: Feet supported Sitting balance-Leahy Scale: Normal     Standing balance support: Bilateral upper extremity supported;During functional activity Standing balance-Leahy Scale: Fair Standing balance comment: reliant on UE support due to WB precautions                             Pertinent Vitals/Pain Pain Assessment: Faces Pain Score: 5  Faces Pain Scale: Hurts even more Pain Location: L foot Pain Descriptors / Indicators: Discomfort;Grimacing;Throbbing Pain Intervention(s): Patient requesting pain meds-RN notified;Limited activity within patient's tolerance;Monitored during session;Repositioned    Home Living Family/patient expects to be discharged to:: Private residence Living Arrangements: Alone Available Help at Discharge: Family;Friend(s);Available PRN/intermittently Type of Home: House Home Access: Stairs to enter Entrance Stairs-Rails: Right;Left;Can reach both Entrance Stairs-Number of Steps: 3-4 Home Layout: One level Home Equipment: Walker - 4 wheels;Cane - single point;Grab bars - tub/shower Additional Comments: He is unsure if his walker has 2 wheels or 4 wheels    Prior Function Level of Independence: Independent         Comments: Independent with ADLs, iADLs and mobility without use of AD     Hand Dominance   Dominant Hand: Right    Extremity/Trunk Assessment   Upper Extremity Assessment Upper Extremity Assessment: Defer to  OT evaluation    Lower Extremity Assessment Lower Extremity Assessment: LLE deficits/detail LLE Sensation: decreased light touch    Cervical /  Trunk Assessment Cervical / Trunk Assessment: Normal  Communication   Communication: No difficulties  Cognition Arousal/Alertness: Awake/alert Behavior During Therapy: WFL for tasks assessed/performed Overall Cognitive Status: Within Functional Limits for tasks assessed                                        General Comments General comments (skin integrity, edema, etc.): Extended time spent educating pt on safety techniques during ADLs, maintaining WB precautions and safety while healing.     Exercises     Assessment/Plan    PT Assessment Patient needs continued PT services  PT Problem List Decreased strength;Decreased mobility;Decreased activity tolerance;Decreased balance;Pain;Decreased safety awareness       PT Treatment Interventions DME instruction;Therapeutic activities;Gait training;Therapeutic exercise;Patient/family education;Stair training;Functional mobility training;Balance training;Neuromuscular re-education    PT Goals (Current goals can be found in the Care Plan section)  Acute Rehab PT Goals Patient Stated Goal: go home today PT Goal Formulation: With patient Time For Goal Achievement: 10/21/20 Potential to Achieve Goals: Fair    Frequency Min 3X/week   Barriers to discharge Decreased caregiver support      Co-evaluation               AM-PAC PT "6 Clicks" Mobility  Outcome Measure Help needed turning from your back to your side while in a flat bed without using bedrails?: None Help needed moving from lying on your back to sitting on the side of a flat bed without using bedrails?: None Help needed moving to and from a bed to a chair (including a wheelchair)?: A Little Help needed standing up from a chair using your arms (e.g., wheelchair or bedside chair)?: A Little Help needed to walk in hospital room?: A Little Help needed climbing 3-5 steps with a railing? : A Little 6 Click Score: 20    End of Session   Activity Tolerance:  Patient limited by pain Patient left: in bed;with call bell/phone within reach;with bed alarm set Nurse Communication: Mobility status PT Visit Diagnosis: Unsteadiness on feet (R26.81);Other abnormalities of gait and mobility (R26.89);Difficulty in walking, not elsewhere classified (R26.2);Pain Pain - Right/Left: Left Pain - part of body: Ankle and joints of foot    Time: 1100-1110 PT Time Calculation (min) (ACUTE ONLY): 10 min   Charges:   PT Evaluation $PT Eval Moderate Complexity: 1 Mod          Mackinze Criado, PT, GCS 10/14/20,11:33 AM

## 2020-10-14 NOTE — Evaluation (Signed)
Occupational Therapy Evaluation Patient Details Name: Charles Thompson MRN: [id number] DOB: [birth year] Today's Date: 10/14/2020    History of Present Illness 59 y.o. male PMHx diabetes mellitus type 2 with peripheral neuropathy, s/p amputations, hypertension, hyperlipidemia, anxiety, depression, and tobacco abuse presents with complaints of severe left foot pain. Pt diagnosed with cellulitis and underwent I&D on 11/28.    Clinical Impression   PTA, pt lives alone and reports Independence with ADLs, IADLs and mobility without AD. Pt presents now with deficits in pain, balance and endurance. Educated pt on podiatry's recommendations for WB with pt reporting hx of amputations requiring NWB status and familiar with techniques. Pt overall min guard for sit to stand and limited hopping at bedside with RW though noted with some impulsivity of movements. Pt declined further mobility secondary to pain. Pt overall Setup for UB ADLs and min guard for LB ADLs at this time. Educated on safety during ADLs to promote optimal wound healing. Plan to progress further ADL mobility and balance tasks during next session to decrease fall risk. Recommend HHOT follow-up at discharge.     Follow Up Recommendations  Home health OT;Supervision - Intermittent    Equipment Recommendations  3 in 1 bedside commode;Other (comment) (RW with 2 wheels if pt doesnt not already have)    Recommendations for Other Services       Precautions / Restrictions Precautions Precautions: Fall Restrictions Weight Bearing Restrictions: Yes LLE Weight Bearing: Non weight bearing Other Position/Activity Restrictions: Per podiatry, pt should ideally be NWB, but can minimally weightbear for mobility to bathroom      Mobility Bed Mobility Overal bed mobility: Needs Assistance Bed Mobility: Supine to Sit     Supine to sit: Supervision;HOB elevated     General bed mobility comments: Supervision to ensure safety of L foot, no assist  needed    Transfers Overall transfer level: Needs assistance Equipment used: Rolling walker (2 wheeled) Transfers: Sit to/from Stand Sit to Stand: Min guard         General transfer comment: min guard, pt a bit impulsive and needs cues for hand placement. Able to demo two hops forward and plop back down onto bed. Cues for safety in standing, hopping and turning with RW    Balance Overall balance assessment: Needs assistance Sitting-balance support: No upper extremity supported;Feet supported Sitting balance-Leahy Scale: Good     Standing balance support: Bilateral upper extremity supported;During functional activity Standing balance-Leahy Scale: Poor Standing balance comment: reliant on UE support due to WB precautions                           ADL either performed or assessed with clinical judgement   ADL Overall ADL's : Needs assistance/impaired Eating/Feeding: Independent;Sitting   Grooming: Set up;Sitting   Upper Body Bathing: Set up;Sitting   Lower Body Bathing: Min guard;Sit to/from stand   Upper Body Dressing : Set up;Sitting   Lower Body Dressing: Min guard;Sit to/from stand;Sitting/lateral leans   Toilet Transfer: Minimal assistance;Stand-pivot;RW   Toileting- Clothing Manipulation and Hygiene: Min guard;Sit to/from stand;Sitting/lateral lean         General ADL Comments: Pt with deficits in pain and impaired balance secondary to NWB L LE     Vision Patient Visual Report: No change from baseline Vision Assessment?: No apparent visual deficits     Perception     Praxis      Pertinent Vitals/Pain Pain Assessment: 0-10 Pain Score: 5  Pain Location:  L foot Pain Descriptors / Indicators: Discomfort;Grimacing;Throbbing Pain Intervention(s): Monitored during session;Patient requesting pain meds-RN notified;RN gave pain meds during session     Hand Dominance Right   Extremity/Trunk Assessment Upper Extremity Assessment Upper Extremity  Assessment: Overall WFL for tasks assessed   Lower Extremity Assessment Lower Extremity Assessment: Defer to PT evaluation   Cervical / Trunk Assessment Cervical / Trunk Assessment: Normal   Communication Communication Communication: No difficulties   Cognition Arousal/Alertness: Awake/alert Behavior During Therapy: WFL for tasks assessed/performed Overall Cognitive Status: Within Functional Limits for tasks assessed                                     General Comments  Extended time spent educating pt on safety techniques during ADLs, maintaining WB precautions and safety while healing.     Exercises     Shoulder Instructions      Home Living Family/patient expects to be discharged to:: Private residence Living Arrangements: Alone Available Help at Discharge: Family;Friend(s);Available PRN/intermittently Type of Home: House Home Access: Stairs to enter [employer] of Steps: 3-4 Entrance Stairs-Rails: Right;Left Home Layout: One level     Bathroom Shower/Tub: Chief Strategy Officer: Standard     Home Equipment: Environmental consultant - 4 wheels;Cane - single point;Grab bars - tub/shower   Additional Comments: He is unsure if his walker has 2 wheels or 4 wheels      Prior Functioning/Environment Level of Independence: Independent        Comments: Independent with ADLs, iADLs and mobility without use of AD        OT Problem List: Decreased activity tolerance;Impaired balance (sitting and/or standing);Decreased safety awareness;Decreased knowledge of use of DME or AE;Decreased knowledge of precautions;Pain      OT Treatment/Interventions: Self-care/ADL training;Therapeutic exercise;DME and/or AE instruction;Therapeutic activities;Patient/family education    OT Goals(Current goals can be found in the care plan section) Acute Rehab OT Goals Patient Stated Goal: go home today OT Goal Formulation: With patient Time For Goal Achievement:  10/28/20 Potential to Achieve Goals: Good ADL Goals Pt Will Perform Grooming: with modified independence;standing Pt Will Perform Lower Body Dressing: with modified independence;sitting/lateral leans;sit to/from stand Pt Will Transfer to Toilet: with modified independence;ambulating;regular height toilet Pt Will Perform Toileting - Clothing Manipulation and hygiene: with modified independence;sitting/lateral leans;sit to/from stand  OT Frequency: Min 2X/week   Barriers to D/C: Decreased caregiver support          Co-evaluation              AM-PAC OT "6 Clicks" Daily Activity     Outcome Measure Help from another person eating meals?: None Help from another person taking care of personal grooming?: A Little Help from another person toileting, which includes using toliet, bedpan, or urinal?: A Little Help from another person bathing (including washing, rinsing, drying)?: A Little Help from another person to put on and taking off regular upper body clothing?: A Little Help from another person to put on and taking off regular lower body clothing?: A Little 6 Click Score: 19   End of Session Equipment Utilized During Treatment: Rolling walker Nurse Communication: Mobility status  Activity Tolerance: Patient limited by pain Patient left: in bed;with call bell/phone within reach;with bed alarm set;with nursing/sitter in room  OT Visit Diagnosis: Unsteadiness on feet (R26.81);Other abnormalities of gait and mobility (R26.89);Pain Pain - Right/Left: Left Pain - part of body: Ankle and joints  of foot                Time: 8016-5537 OT Time Calculation (min): 26 min Charges:  OT General Charges $OT Visit: 1 Visit OT Evaluation $OT Eval Low Complexity: 1 Low OT Treatments $Self Care/Home Management : 8-22 mins  Lorre Munroe, OTR/L  Lorre Munroe 10/14/2020, 9:07 AM

## 2020-10-14 NOTE — Progress Notes (Signed)
Pharmacy Antibiotic Note  Charles Thompson is an 59 y.o. male who presented to [facility] on 10/10/2020 with a chief complaint of progressive foot pain/cellulitis, s/p I&D on 11/25 and 10/13/20. Initially started on broad spectrum ABX- vancomycin, flagyl and cefepime, then narrowed to Unasyn on 11/27 for foot abscess culture with streptococcus intermedius.  Blood cultures now growing Staph epidermidis, so Pharmacy consulted to resume vancomycin.  Renal function improving, afebrile, WBC up 12.8.  Plan: Increase vanc to 1gm IV Q12H for goal trough 15-20 mcg/mL Monitor renal fxn, micro data, vanc trough as indicated  Height: 6' (182.9 cm) Weight: 106.6 kg (235 lb) IBW/kg (Calculated) : 77.6  Temp (24hrs), Avg:97.9 F (36.6 C), Min:97.6 F (36.4 C), Max:98.4 F (36.9 C)  Recent Labs  Lab 10/10/20 0244 10/10/20 0603 10/11/20 0227 10/12/20 0121 10/13/20 0146 10/14/20 0301  WBC 22.6*  --  23.3* 17.4* 11.8* 12.8*  CREATININE 1.47*  --  1.29* 1.19 0.98 0.96  LATICACIDVEN 1.1 1.1  --   --   --   --     Estimated Creatinine Clearance: 103.2 mL/min (by C-G formula based on SCr of 0.96 mg/dL).    No Known Allergies  11/25 Vanc >>11/27, restarted 11/28 >> 11/25 Cefepime>>11/27 11/25 Flagyl>>11/27 11/27 Unasyn >> 11/28  11/25 BCx - [facility] (BCID Staph epidermidis) 11/25 MRSA PCR neg, Staph aureus positive 11/25 L foot abscess, OR cx - Strep intermedius (preliminary) 11/28 L foot abscess, OR cx - NGTD  Trisa Cranor D. Laney Potash, PharmD, BCPS, BCCCP 10/14/2020, 10:49 AM

## 2020-10-14 NOTE — Progress Notes (Signed)
Patient unable to stay and finish his potassium phosphate. Education give on the importance of this med and why he needs it.

## 2020-10-14 NOTE — Plan of Care (Signed)
  Problem: Education: Goal: Knowledge of General Education information will improve Description: Including pain rating scale, medication(s)/side effects and non-pharmacologic comfort measures Outcome: Adequate for Discharge   Problem: Health Behavior/Discharge Planning: Goal: Ability to manage health-related needs will improve Outcome: Adequate for Discharge   Problem: Clinical Measurements: Goal: Ability to maintain clinical measurements within normal limits will improve Outcome: Adequate for Discharge Goal: Will remain free from infection Outcome: Adequate for Discharge Goal: Diagnostic test results will improve Outcome: Adequate for Discharge Goal: Respiratory complications will improve Outcome: Adequate for Discharge Goal: Cardiovascular complication will be avoided Outcome: Adequate for Discharge   Problem: Activity: Goal: Risk for activity intolerance will decrease Outcome: Adequate for Discharge   Problem: Nutrition: Goal: Adequate nutrition will be maintained Outcome: Adequate for Discharge   Problem: Coping: Goal: Level of anxiety will decrease Outcome: Adequate for Discharge   Problem: Elimination: Goal: Will not experience complications related to bowel motility Outcome: Adequate for Discharge Goal: Will not experience complications related to urinary retention Outcome: Adequate for Discharge   Problem: Pain Managment: Goal: General experience of comfort will improve Outcome: Adequate for Discharge   Problem: Safety: Goal: Ability to remain free from injury will improve Outcome: Adequate for Discharge   Problem: Skin Integrity: Goal: Risk for impaired skin integrity will decrease Outcome: Adequate for Discharge   Problem: Clinical Measurements: Goal: Ability to avoid or minimize complications of infection will improve Outcome: Adequate for Discharge   Problem: Skin Integrity: Goal: Skin integrity will improve Outcome: Adequate for Discharge   

## 2020-10-14 NOTE — Discharge Summary (Signed)
Physician Discharge Summary  Charles Thompson LPF:[id number] DOB: Mar 26, 1961 DOA: 10/10/2020  PCP: Haywood Pao, MD  Admit date: 10/10/2020 Discharge date: 10/14/2020  Admitted From: Home Disposition: Home  Recommendations for Outpatient Follow-up:  1. Follow ups as below. 2. Please obtain CBC/BMP/Mag at follow up 3. Please follow up on the following pending results: Surgical pathology and repeat blood culture  Home Health: PT/OT Equipment/Devices: Rolling walker and 3 in 1 bedside commode  Discharge Condition: Stable CODE STATUS: Full code   Follow-up Information    [name], [name], MD. Schedule an appointment as soon as possible for a visit in 1 week(s).   Specialty: Internal Medicine Contact information: [place] [address] [phone]               Hospital Course: 59 year old male with history of DM-2, neuropathy s/p left TMT amputations, HTN, HLD, anxiety, depression and tobacco use disorder presenting with progressive left foot pain and ulcer with drainage, and admitted for sepsis due to left foot cellulitis and diabetic wound infection.    Left foot x-ray with focal soft tissue gas in the superficial subcutaneous tissues over the resection stump at the level of the second metatarsal but no evidence of osteomyelitis. He was a started on vancomycin, cefepime and Flagyl.  Podiatry consulted.    Patient underwent incision and drainage of left foot by Dr. Amalia Hailey on 10/10/2020.  Blood cultures NGTD.  Surgical tissue culture with few Streptococcus intermedius.  Infectious disease consulted.  De-escalated antibiotic to IV Unasyn.  I&D on 10/13/2020 with delayed primary closure of left foot wound.  Deep wound cultures NGTD.  Pathology pending.  Blood culture with Staph epidermidis and Streptococcus intermedius both from one venipuncture. Although podiatry and infectious disease suggested staying one more night for observation and further  evaluation of his blood culture result, patient was very adamant about going home.  Infectious disease recommended Levaquin for 10 days on discharge.  He already has discharge instruction including follow-up instruction from podiatry.  Patient has medical decision making capacity.  Home health PT/OT, rolling walker and 3 in 1 commode ordered as recommended by therapy.  See individual problem list below for more on hospital course.  Discharge Diagnoses:  Sepsis due to purulent cellulitis and diabetic foot ulcer of left foot: POA.  Met criteria with tachycardia and leukocytosis.  Left foot x-ray with focal soft tissue gas in the superficial subcutaneous tissues over the resection stump at the level of the second metatarsal but no evidence of osteomyelitis.Normal ABI in 06/2018.  Culture data as above.  Leukocytosis improved. -Status post I&D of left foot on 11/25 and 11/28 with delayed primary wound closure -IV vancomycin, cefepime and Flagyl 1125-11/27.  Unasyn 11/27-11/29 -Discharged on Levaquin for 10 more days. -Pain control with bowel regimen. -Encouraged tobacco cessation -Follow surgical wound culture and pathology, and repeat blood culture results -Outpatient follow-up and wound care per podiatry.  Abnormal blood culture: Blood culture with staph epidermis and Streptococcus intermedius but only one venipuncture.  Wound culture is also Streptococcus intermedius. -Antibiotics as above -Follow repeat blood culture  Hyponatremia: Na 128>> 134>136.  Resolved.  Controlled DM-2 with hyperglycemia, neuropathy and diabetic foot ulcer: A1c 5.9%.:  Toujeo 70 units twice daily and Synjardy12-1000 mg. -Continue home medications  Anion gap metabolic acidosis: Unclear etiology.Due to sepsis?  Improved. -Recheck at follow-up.  Essential hypertension: Normotensive. -Continue home medications  Hypophosphatemia/hypomagnesemia: Hypomagnesemia resolved.  Hypophosphatemia replenished prior to  discharge  Anxiety: Home medications include sertraline  150 mg daily and clonazepam 1 mg nightly. -Continue home Zoloft and Klonopin  Hyperlipidemia:  -Continue statin  Tobacco use disorder: Reports smoking about a pack a day. -Encouraged tobacco cessation -Nicotine patch  Insomnia: Improved. -Continue home Klonopin at night    Body mass index is 31.87 kg/m.            Discharge Exam: Vitals:   10/13/20 2141 10/14/20 0557  BP: 123/63 113/61  Pulse: 66 78  Resp: 17 18  Temp: 97.6 F (36.4 C) 97.8 F (36.6 C)  SpO2: 96% 95%    GENERAL: No apparent distress.  Nontoxic. HEENT: MMM.  Vision and hearing grossly intact.  NECK: Supple.  No apparent JVD.  RESP: On RA.  No IWOB.  Fair aeration bilaterally. CVS:  RRR. Heart sounds normal.  ABD/GI/GU: Bowel sounds present. Soft. Non tender.  MSK/EXT:  Moves extremities. No edema.  Ace wrap and dressing over left foot DCI.  No erythema swelling proximally. SKIN: Ace wrap and dressing over left foot. NEURO: Awake, alert and oriented appropriately.  No apparent focal neuro deficit. PSYCH: Calm. Normal affect.   Discharge Instructions  Discharge Instructions    Call MD for:  extreme fatigue   Complete by: As directed    Call MD for:  persistant dizziness or light-headedness   Complete by: As directed    Call MD for:  persistant nausea and vomiting   Complete by: As directed    Call MD for:  redness, tenderness, or signs of infection (pain, swelling, redness, odor or green/yellow discharge around incision site)   Complete by: As directed    Call MD for:  severe uncontrolled pain   Complete by: As directed    Call MD for:  temperature >100.4   Complete by: As directed    Diet - low sodium heart healthy   Complete by: As directed    Diet Carb Modified   Complete by: As directed    Discharge instructions   Complete by: As directed    It has been a pleasure taking care of you!  You were hospitalized due to left  foot infection.  You were treated surgically medically.  There is a possibility that you could have your blood infection as well.  Our experts recommended staying 1 more night for further evaluation about possible blood infection but you wanted to go home today.  We have sent a prescription for antibiotic to your pharmacy.  It is very important that you pick this prescription and start taking until you complete the whole course.  Follow-up with your podiatrist as recommended to you.  It is important that you quit smoking cigarettes.  Smoking can delay wound healing and cause as a complication.  You may use nicotine patch to help you quit smoking.  Nicotine patch is available over-the-counter.  You may also discuss other options to help you quit smoking with your primary care doctor. You can also talk to professional counselors at 1-800-QUIT-NOW [phone] [phone]) for free smoking cessation counseling.     Take care,   Discharge wound care:   Complete by: As directed    Keep dressing intact clean and dry as recommended to you by your podiatrist.   Increase activity slowly   Complete by: As directed      Allergies as of 10/14/2020   No Known Allergies     Medication List    STOP taking these medications   doxycycline 100 MG tablet Commonly known as: VIBRA-TABS  TAKE these medications   amLODipine 10 MG tablet Commonly known as: NORVASC Take 10 mg by mouth daily.   aspirin 81 MG tablet Take 81 mg by mouth daily.   atorvastatin 10 MG tablet Commonly known as: LIPITOR Take 10 mg by mouth daily.   clonazePAM 1 MG tablet Commonly known as: KlonoPIN Take 1 tablet (1 mg total) by mouth at bedtime.   fluticasone 50 MCG/ACT nasal spray Commonly known as: FLONASE Place 1 spray into both nostrils 2 (two) times daily.   gabapentin 300 MG capsule Commonly known as: NEURONTIN Take 300 mg by mouth 2 (two) times daily.   levofloxacin 750 MG tablet Commonly known as: Levaquin Take 1  tablet (750 mg total) by mouth daily for 10 days.   lisinopril-hydrochlorothiazide 20-12.5 MG tablet Commonly known as: ZESTORETIC Take 1 tablet by mouth daily. What changed: when to take this   nicotine 21 mg/24hr patch Commonly known as: NICODERM CQ - dosed in mg/24 hours Place 1 patch (21 mg total) onto the skin daily. Start taking on: October 15, 2020   omeprazole 20 MG capsule Commonly known as: PRILOSEC Take 20 mg by mouth daily as needed (heartburn).   OneTouch Delica Plus [id number] Misc USE TO TEST BLOOD SUGAR THREE TIMES DAILY   OneTouch Verio test strip Generic drug: glucose blood USE TO TEST BLOOD SUGAR THREE TIMES DAILY   oxyCODONE-acetaminophen 5-325 MG tablet Commonly known as: Percocet Take 1 tablet by mouth every 4 (four) hours as needed for severe pain.   sertraline 100 MG tablet Commonly known as: ZOLOFT TAKE 1 AND 1/2 TABLETS(150 MG) BY MOUTH DAILY   Synjardy 12.03-999 MG Tabs Generic drug: Empagliflozin-metFORMIN HCl   Toujeo Max SoloStar 300 UNIT/ML Solostar Pen Generic drug: insulin glargine (2 Unit Dial) Inject 70 Units into the skin 2 (two) times daily.            Durable Medical Equipment  (From admission, onward)         Start     Ordered   10/14/20 1303  For home use only DME Bedside commode  Once       Question:  Patient needs a bedside commode to treat with the following condition  Answer:  Unsteady gait   10/14/20 1302   10/14/20 1302  For home use only DME Walker rolling  Once       Question Answer Comment  Walker: With [place]   Patient needs a walker to treat with the following condition Unsteady gait      10/14/20 1302           Discharge Care Instructions  (From admission, onward)         Start     Ordered   10/14/20 0000  Discharge wound care:       Comments: Keep dressing intact clean and dry as recommended to you by your podiatrist.   10/14/20 1353           Consultations:  Podiatry  Infectious disease  Procedures/Studies:   I&D of left foot on 11/25 and 11/28 with delayed primary wound closure by Dr. Amalia Hailey (podiatry).   DG Foot Complete Left  Result Date: 10/10/2020 CLINICAL DATA:  Left foot pain since Friday. Diabetes. Redness and warmth on the bottom and side of the foot. EXAM: LEFT FOOT - COMPLETE 3+ VIEW COMPARISON:  10/04/2020 FINDINGS: Previous amputations of the first ray at the tarsometatarsal level and of the second through fifth rays at the mid  metatarsal level. Productive bone and callus formation demonstrated at the resection stumps. Bone cortex appears intact. No evidence of cortical erosion or bone destruction. No abnormal sclerosis. No focal bone lesion or bone destruction. Degenerative changes in the intertarsal joints. There is evidence of focal soft tissue gas in the superficial subcutaneous tissues over the resection stump at the level of the second metatarsal. This may represent focal cellulitis. Vascular calcifications are seen. IMPRESSION: Postoperative amputations. Focal soft tissue gas in the superficial subcutaneous tissues over the resection stump at the level of the second metatarsal, possibly indicating soft tissue infection. No evidence of osteomyelitis. Electronically Signed   By: Lucienne Capers M.D.   On: 10/10/2020 02:49   DG Foot Complete Left  Result Date: 10/09/2020 Please see detailed radiograph report in office note.  DG MINI C-ARM IMAGE ONLY  Result Date: 10/13/2020 There is no interpretation for this exam.  This order is for images obtained during a surgical procedure.  Please See "Surgeries" Tab for more information regarding the procedure.        The results of significant diagnostics from this hospitalization (including imaging, microbiology, ancillary and laboratory) are listed below for reference.     Microbiology: Recent Results (from the past 240 hour(s))  Blood Culture (routine x  2)     Status: Abnormal   Collection Time: 10/10/20  5:42 AM   Specimen: BLOOD  Result Value Ref Range Status   Specimen Description BLOOD LEFT ANTECUBITAL  Final   Special Requests   Final    BOTTLES DRAWN AEROBIC AND ANAEROBIC Blood Culture adequate volume   Culture  Setup Time   Final    GRAM POSITIVE COCCI IN CLUSTERS AEROBIC BOTTLE ONLY Organism ID to follow CRITICAL RESULT CALLED TO, READ BACK BY AND VERIFIED WITH: PHARMD L CURRAN [id number] 1939 MLM    Culture (A)  Final    STAPHYLOCOCCUS EPIDERMIDIS THE SIGNIFICANCE OF ISOLATING THIS ORGANISM FROM A SINGLE SET OF BLOOD CULTURES WHEN MULTIPLE SETS ARE DRAWN IS UNCERTAIN. PLEASE NOTIFY THE MICROBIOLOGY DEPARTMENT WITHIN ONE WEEK IF SPECIATION AND SENSITIVITIES ARE REQUIRED. Performed at [facility], [place] [address] [place], [place] [address]    Report Status 10/14/2020 FINAL  Final  Blood Culture (routine x 2)     Status: Abnormal (Preliminary result)   Collection Time: 10/10/20  5:42 AM   Specimen: BLOOD  Result Value Ref Range Status   Specimen Description BLOOD WRIST RIGHT  Final   Special Requests   Final    BOTTLES DRAWN AEROBIC AND ANAEROBIC Blood Culture results may not be optimal due to an inadequate volume of blood received in culture bottles   Culture  Setup Time   Final    GRAM POSITIVE COCCI IN CHAINS ANAEROBIC BOTTLE ONLY CRITICAL VALUE NOTED.  VALUE IS CONSISTENT WITH PREVIOUSLY REPORTED AND CALLED VALUE.    Culture (A)  Final    STREPTOCOCCUS INTERMEDIUS SUSCEPTIBILITIES TO FOLLOW Performed at [facility], [place] [address], [place], [place] [address]    Report Status PENDING  Incomplete  Blood Culture ID Panel (Reflexed)     Status: Abnormal   Collection Time: 10/10/20  5:42 AM  Result Value Ref Range Status   Enterococcus faecalis NOT DETECTED NOT DETECTED Final   Enterococcus Faecium NOT DETECTED NOT DETECTED Final   Listeria monocytogenes NOT DETECTED NOT DETECTED Final   Staphylococcus  species DETECTED (A) NOT DETECTED Final    Comment: CRITICAL RESULT CALLED TO, READ BACK BY AND VERIFIED  WITH: PHARMD L CURRAN [id number] 1939 MLM    Staphylococcus aureus (BCID) NOT DETECTED NOT DETECTED Final   Staphylococcus epidermidis DETECTED (A) NOT DETECTED Final    Comment: CRITICAL RESULT CALLED TO, READ BACK BY AND VERIFIED WITH: PHARMD L CURRAN [id number] 1939 MLM    Staphylococcus lugdunensis NOT DETECTED NOT DETECTED Final   Streptococcus species NOT DETECTED NOT DETECTED Final   Streptococcus agalactiae NOT DETECTED NOT DETECTED Final   Streptococcus pneumoniae NOT DETECTED NOT DETECTED Final   Streptococcus pyogenes NOT DETECTED NOT DETECTED Final   A.calcoaceticus-baumannii NOT DETECTED NOT DETECTED Final   Bacteroides fragilis NOT DETECTED NOT DETECTED Final   Enterobacterales NOT DETECTED NOT DETECTED Final   Enterobacter cloacae complex NOT DETECTED NOT DETECTED Final   Escherichia coli NOT DETECTED NOT DETECTED Final   Klebsiella aerogenes NOT DETECTED NOT DETECTED Final   Klebsiella oxytoca NOT DETECTED NOT DETECTED Final   Klebsiella pneumoniae NOT DETECTED NOT DETECTED Final   Proteus species NOT DETECTED NOT DETECTED Final   Salmonella species NOT DETECTED NOT DETECTED Final   Serratia marcescens NOT DETECTED NOT DETECTED Final   Haemophilus influenzae NOT DETECTED NOT DETECTED Final   Neisseria meningitidis NOT DETECTED NOT DETECTED Final   Pseudomonas aeruginosa NOT DETECTED NOT DETECTED Final   Stenotrophomonas maltophilia NOT DETECTED NOT DETECTED Final   Candida albicans NOT DETECTED NOT DETECTED Final   Candida auris NOT DETECTED NOT DETECTED Final   Candida glabrata NOT DETECTED NOT DETECTED Final   Candida krusei NOT DETECTED NOT DETECTED Final   Candida parapsilosis NOT DETECTED NOT DETECTED Final   Candida tropicalis NOT DETECTED NOT DETECTED Final   Cryptococcus neoformans/gattii NOT DETECTED NOT DETECTED Final   Methicillin resistance mecA/C NOT  DETECTED NOT DETECTED Final    Comment: Performed at [facility], [address] [place], [place] [address]  Resp Panel by RT-PCR (Flu A&B, Covid) Nasopharyngeal Swab     Status: None   Collection Time: 10/10/20  5:57 AM   Specimen: Nasopharyngeal Swab; Nasopharyngeal(NP) swabs in vial transport medium  Result Value Ref Range Status   SARS Coronavirus 2 by RT PCR NEGATIVE NEGATIVE Final    Comment: (NOTE) SARS-CoV-2 target nucleic acids are NOT DETECTED.  The SARS-CoV-2 RNA is generally detectable in upper respiratory specimens during the acute phase of infection. The lowest concentration of SARS-CoV-2 viral copies this assay can detect is 138 copies/mL. A negative result does not preclude SARS-Cov-2 infection and should not be used as the sole basis for treatment or other patient management decisions. A negative result may occur with  improper specimen collection/handling, submission of specimen other than nasopharyngeal swab, presence of viral mutation(s) within the areas targeted by this assay, and inadequate number of viral copies(<138 copies/mL). A negative result must be combined with clinical observations, patient history, and epidemiological information. The expected result is Negative.  Fact Sheet for Patients:  EntrepreneurPulse.com.au  Fact Sheet for Healthcare Providers:  IncredibleEmployment.be  This test is no t yet approved or cleared by the [place] FDA and  has been authorized for detection and/or diagnosis of SARS-CoV-2 by FDA under an Emergency Use Authorization (EUA). This EUA will remain  in effect (meaning this test can be used) for the duration of the COVID-19 declaration under Section 564(b)(1) of the Act, 21 U.S.C.section 360bbb-3(b)(1), unless the authorization is terminated  or revoked sooner.       Influenza A by PCR NEGATIVE NEGATIVE Final   Influenza B by PCR NEGATIVE NEGATIVE Final  Comment:  (NOTE) The Xpert Xpress SARS-CoV-2/FLU/RSV plus assay is intended as an aid in the diagnosis of influenza from Nasopharyngeal swab specimens and should not be used as a sole basis for treatment. Nasal washings and aspirates are unacceptable for Xpert Xpress SARS-CoV-2/FLU/RSV testing.  Fact Sheet for Patients: EntrepreneurPulse.com.au  Fact Sheet for Healthcare Providers: IncredibleEmployment.be  This test is not yet approved or cleared by the [place] FDA and has been authorized for detection and/or diagnosis of SARS-CoV-2 by FDA under an Emergency Use Authorization (EUA). This EUA will remain in effect (meaning this test can be used) for the duration of the COVID-19 declaration under Section 564(b)(1) of the Act, 21 U.S.C. section 360bbb-3(b)(1), unless the authorization is terminated or revoked.  Performed at [facility], [place] [address], [place], [place] [address]   Surgical pcr screen     Status: Abnormal   Collection Time: 10/10/20 11:11 AM   Specimen: Nasal Mucosa; Nasal Swab  Result Value Ref Range Status   MRSA, PCR NEGATIVE NEGATIVE Final   Staphylococcus aureus POSITIVE (A) NEGATIVE Final    Comment: (NOTE) The Xpert SA Assay (FDA approved for NASAL specimens in patients 39 years of age and older), is one component of a comprehensive surveillance program. It is not intended to diagnose infection nor to guide or monitor treatment. Performed at [facility], [place] [address], [place], [place] [address]   Aerobic/Anaerobic Culture (surgical/deep wound)     Status: None (Preliminary result)   Collection Time: 10/10/20  2:23 PM   Specimen: Abscess; Wound  Result Value Ref Range Status   Specimen Description ABSCESS LEFT FOOT  Final   Special Requests PT ON CEFEPIME  Final   Gram Stain   Final    RARE WBC PRESENT,BOTH PMN AND MONONUCLEAR FEW GRAM POSITIVE COCCI IN PAIRS Performed at [facility], [address], [place], [place] [address]    Culture   Final    FEW STREPTOCOCCUS INTERMEDIUS NO ANAEROBES ISOLATED; CULTURE IN PROGRESS FOR 5 DAYS    Report Status PENDING  Incomplete  Aerobic/Anaerobic Culture (surgical/deep wound)     Status: None (Preliminary result)   Collection Time: 10/13/20  8:13 AM   Specimen: PATH Other; Tissue  Result Value Ref Range Status   Specimen Description ABSCESS LEFT FOOT  Final   Special Requests SPEC A LEFT FOOT ABSCESS  Final   Gram Stain NO WBC SEEN NO ORGANISMS SEEN   Final   Culture   Final    NO GROWTH < 24 HOURS Performed at [facility], [place] [address] [place], [place] [address]    Report Status PENDING  Incomplete     Labs: BNP (last 3 results) No results for input(s): BNP in the last 8760 hours. Basic Metabolic Panel: Recent Labs  Lab 10/10/20 0244 10/11/20 0227 10/12/20 0121 10/13/20 0146 10/14/20 0301  NA 128* 134* 136 136 135  K 3.5 4.1 4.3 4.0 4.0  CL 92* 98 101 101 101  CO2 20* 16* 19* 20* 21*  GLUCOSE 105* 147* 129* 132* 174*  BUN 24* 24* 20 13 12   CREATININE 1.47* 1.29* 1.19 0.98 0.96  CALCIUM 9.0 8.9 8.9 8.4* 8.1*  MG  --   --  1.9 1.7 2.1  PHOS  --   --  2.2* 2.1* 2.0*   Liver Function Tests: Recent Labs  Lab 10/10/20 0244 10/11/20 0227 10/12/20 0121 10/13/20 0146 10/14/20 0301  AST 17 17  --   --   --  ALT 23 20  --   --   --   ALKPHOS 112 119  --   --   --   BILITOT 1.2 1.5*  --   --   --   PROT 7.4 6.6  --   --   --   ALBUMIN 3.3* 2.8* 2.5* 2.3* 2.2*   No results for input(s): LIPASE, AMYLASE in the last 168 hours. No results for input(s): AMMONIA in the last 168 hours. CBC: Recent Labs  Lab 10/10/20 0244 10/11/20 0227 10/12/20 0121 10/13/20 0146 10/14/20 0301  WBC 22.6* 23.3* 17.4* 11.8* 12.8*  NEUTROABS 18.6*  --  14.3* 9.2*  --   HGB 14.0 12.9* 11.7* 11.7* 10.4*  HCT 41.6 39.2 35.4* 34.8* 31.2*  MCV 86.8 88.1 87.0 86.6 86.4  PLT 375 352 342 330 315   Cardiac Enzymes: No results for  input(s): CKTOTAL, CKMB, CKMBINDEX, TROPONINI in the last 168 hours. BNP: Invalid input(s): POCBNP CBG: Recent Labs  Lab 10/13/20 1154 10/13/20 1706 10/13/20 2142 10/14/20 0815 10/14/20 1222  GLUCAP 217* 174* 131* 126* 93   D-Dimer No results for input(s): DDIMER in the last 72 hours. Hgb A1c No results for input(s): HGBA1C in the last 72 hours. Lipid Profile No results for input(s): CHOL, HDL, LDLCALC, TRIG, CHOLHDL, LDLDIRECT in the last 72 hours. Thyroid function studies No results for input(s): TSH, T4TOTAL, T3FREE, THYROIDAB in the last 72 hours.  Invalid input(s): FREET3 Anemia work up No results for input(s): VITAMINB12, FOLATE, FERRITIN, TIBC, IRON, RETICCTPCT in the last 72 hours. Urinalysis    Component Value Date/Time   COLORURINE YELLOW 12/18/2007 1926   APPEARANCEUR CLEAR 12/18/2007 1926   LABSPEC 1.040 (H) 12/18/2007 1926   PHURINE 6.5 12/18/2007 1926   GLUCOSEU >1000 (A) 12/18/2007 1926   HGBUR NEGATIVE 12/18/2007 1926   HGBUR NEGATIVE POINT OF CARE RESULT 12/18/2007 1926   BILIRUBINUR NEGATIVE 12/18/2007 1926   KETONESUR 40 (A) 12/18/2007 1926   PROTEINUR NEGATIVE 12/18/2007 1926   UROBILINOGEN 1.0 12/18/2007 1926   NITRITE NEGATIVE 12/18/2007 1926   LEUKOCYTESUR NEGATIVE 12/18/2007 1926   Sepsis Labs Invalid input(s): PROCALCITONIN,  WBC,  LACTICIDVEN   Time coordinating discharge: 40 minutes  SIGNED:  Mercy Riding, MD  [facility] 10/14/2020, 2:04 PM  If 7PM-7AM, please contact night-coverage [email or web address]

## 2020-10-14 NOTE — Discharge Instructions (Signed)
Recommendations for Outpatient Follow-up:  1. Follow ups as below. 2. Please obtain CBC/BMP/Mag at follow up 3. Please follow up on the following pending results: Surgical pathology and repeat blood culture    Cellulitis, Adult  Cellulitis is a skin infection. The infected area is often warm, red, swollen, and sore. It occurs most often in the arms and lower legs. It is very important to get treated for this condition. What are the causes? This condition is caused by bacteria. The bacteria enter through a break in the skin, such as a cut, burn, insect bite, open sore, or crack. What increases the risk? This condition is more likely to occur in people who:  Have a weak body defense system (immune system).  Have open cuts, burns, bites, or scrapes on the skin.  Are older than 60 years of age.  Have a blood sugar problem (diabetes).  Have a long-lasting (chronic) liver disease (cirrhosis) or kidney disease.  Are very overweight (obese).  Have a skin problem, such as: ? Itchy rash (eczema). ? Slow movement of blood in the veins (venous stasis). ? Fluid buildup below the skin (edema).  Have been treated with high-energy rays (radiation).  Use IV drugs. What are the signs or symptoms? Symptoms of this condition include:  Skin that is: ? Red. ? Streaking. ? Spotting. ? Swollen. ? Sore or painful when you touch it. ? Warm.  A fever.  Chills.  Blisters. How is this diagnosed? This condition is diagnosed based on:  Medical history.  Physical exam.  Blood tests.  Imaging tests. How is this treated? Treatment for this condition may include:  Medicines to treat infections or allergies.  Home care, such as: ? Rest. ? Placing cold or warm cloths (compresses) on the skin.  Hospital care, if the condition is very bad. Follow these instructions at home: Medicines  Take over-the-counter and prescription medicines only as told by your doctor.  If you were  prescribed an antibiotic medicine, take it as told by your doctor. Do not stop taking it even if you start to feel better. General instructions   Drink enough fluid to keep your pee (urine) pale yellow.  Do not touch or rub the infected area.  Raise (elevate) the infected area above the level of your heart while you are sitting or lying down.  Place cold or warm cloths on the area as told by your doctor.  Keep all follow-up visits as told by your doctor. This is important. Contact a doctor if:  You have a fever.  You do not start to get better after 1-2 days of treatment.  Your bone or joint under the infected area starts to hurt after the skin has healed.  Your infection comes back. This can happen in the same area or another area.  You have a swollen bump in the area.  You have new symptoms.  You feel ill and have muscle aches and pains. Get help right away if:  Your symptoms get worse.  You feel very sleepy.  You throw up (vomit) or have watery poop (diarrhea) for a long time.  You see red streaks coming from the area.  Your red area gets larger.  Your red area turns dark in color. These symptoms may represent a serious problem that is an emergency. Do not wait to see if the symptoms will go away. Get medical help right away. Call your local emergency services (911 in the [place] Do not drive yourself to the  hospital. Summary  Cellulitis is a skin infection. The area is often warm, red, swollen, and sore.  This condition is treated with medicines, rest, and cold and warm cloths.  Take all medicines only as told by your doctor.  Tell your doctor if symptoms do not start to get better after 1-2 days of treatment. This information is not intended to replace advice given to you by your health care provider. Make sure you discuss any questions you have with your health care provider. Document Revised: 03/24/2018 Document Reviewed: 03/24/2018 Elsevier Patient  Education  2020 [employer].

## 2020-10-15 LAB — AEROBIC/ANAEROBIC CULTURE W GRAM STAIN (SURGICAL/DEEP WOUND)

## 2020-10-15 LAB — CULTURE, BLOOD (ROUTINE X 2)

## 2020-10-16 ENCOUNTER — Ambulatory Visit (INDEPENDENT_AMBULATORY_CARE_PROVIDER_SITE_OTHER): Payer: Medicare Other | Admitting: Podiatry

## 2020-10-16 ENCOUNTER — Other Ambulatory Visit: Payer: Self-pay

## 2020-10-16 DIAGNOSIS — L02612 Cutaneous abscess of left foot: Secondary | ICD-10-CM

## 2020-10-16 DIAGNOSIS — L03116 Cellulitis of left lower limb: Secondary | ICD-10-CM

## 2020-10-16 MED ORDER — OXYCODONE-ACETAMINOPHEN 10-325 MG PO TABS
1.0000 | ORAL_TABLET | Freq: Four times a day (QID) | ORAL | 0 refills | Status: DC | PRN
Start: 2020-10-16 — End: 2020-10-22

## 2020-10-16 NOTE — Progress Notes (Signed)
   Subjective:  Patient presents today status post incision and drainage with delayed primary closure inpatient.  Last irrigation and debridement with delayed primary closure was 10/13/2020.  Patient discharged in the hospital [facility] Monday, 10/14/2020.  He presents today for follow-up treatment evaluation regarding his left foot infection.  He continues to have pain and tenderness to the area.  He is taking his oral antibiotics as prescribed upon discharge.  Past Medical History:  Diagnosis Date  . Anxiety   . Arthritis    neck  . Chronic ulcer of right foot (HCC)   . Diabetic peripheral neuropathy (HCC)   . First degree heart block   . GERD (gastroesophageal reflux disease)   . History of osteomyelitis    bilateral feet  . Hyperlipidemia   . Hypertension    followed by pcp  (03-07-2020 per pt never had a stress test)  . MDD (major depressive disorder)   . Type 2 diabetes mellitus with insulin therapy (HCC)    followed by pcp   (03-07-2020  pt stated checks blood sugar twice dialy,  fasting sugar--- 100-160)        Objective/Physical Exam Neurovascular status intact.  Skin incisions appear to be well coapted with sutures and staples intact.  Iodoform packing is intact.  With removal of the packing there is some serous drainage fluid coming from the proximal portion of the incision site.  No dehiscence. No active bleeding noted.  There continues to be some persistent erythema and edema to the posterior heel and ankle concern For active acute infection. There is also a small area of macerated skin about the size of a quarter to the medial aspect of the heel of the surgical extremity.  Assessment: 1. s/p incision and drainage/irrigation and debridement with delayed primary closure. DOS: 10/13/2020   Plan of Care:  1. Patient was evaluated.  2.  Dressings were changed today.  Packing was reapplied today. 3.  Continue oral antibiotics as per discharging infectious disease physician.   Patient is currently taking Levaquin 750 mg daily 4.  Prescription for Percocet 10/325 mg every 6 hours as needed pain 5.  Continue minimal weightbearing on the surgical extremity 6.  Orders placed today for home health nurse dressing changes every other day  7.  I discussed with the patient the concern for the redness and swelling to the rear foot and ankle.  I explained to the patient that he still has an active acute infection.  Stressed the importance of returning to the ED if he notices the redness and swelling increasing or extending proximal up the leg.  Also recommend ED immediately if he begins to experience nausea and vomiting, fever, malaise, or any other indication of systemic infection  8.  Return to clinic in 1 week for follow-up x-ray   [name], DPM [facility]  Dr. [name], DPM    [address] [place], [place] [address]                Office [phone]  Fax [phone]

## 2020-10-16 NOTE — Progress Notes (Signed)
  Subjective:  Patient ID: Charles Thompson, male    DOB: [birth year],  MRN: [id number]  Chief Complaint  Patient presents with  . Diabetic Ulcer    Right foot ulcer "I think it is healing"  . Diabetic Ulcer    Left foot new ulcer, unknown duration "Possibly a month" pt states blood drainage, possibly pus drainage.    58 y.o. male presents for wound care. Hx confirmed with patient.  Wearing the DM shoes. Objective:  Physical Exam: Wound Location: right submet 1, left foot distal Wound Measurement: 2x1 post-debridement, 0.5x0.3 post-debridement Wound Base: Mixed Granular/Fibrotic Peri-wound: Calloused Exudate: Scant/small amount Serosanguinous exudate wound without warmth, erythema, signs of acute infection Wound R 1st Met almost probes to capsule.  5th toe right appears epithelialized.  HPK submet 3,5 right, bilat heels Assessment:   1. Ulcerated, foot, left, limited to breakdown of skin [facility])    Plan:  Patient was evaluated and treated and all questions answered.  Wounds bilateral feet -Debrided as below -Dressed with Silvadene bilateral Band-Aid to the right foot Mepilex border dressing applied to the left -XR leftm no osteomyelitis, gas, fracture  Procedure: Selective Debridement of Wound Rationale: Removal of devitalized tissue from the wound to promote healing.  Pre-Debridement Wound Measurements: 1x1.5 Post-Debridement Wound Measurements: same as pre-debridement. Type of Debridement: sharp selective Tissue Removed: Devitalized soft-tissue Dressing: Dry, sterile, compression dressing. Disposition: Patient tolerated procedure well. Patient to return in 1 week for follow-up.  No follow-ups on file.

## 2020-10-17 NOTE — Anesthesia Postprocedure Evaluation (Signed)
Anesthesia Post Note  Patient: Charles Thompson  Procedure(s) Performed: IRRIGATION AND DEBRIDEMENT OF FOOT (Left Foot)     Patient location during evaluation: PACU Anesthesia Type: General Level of consciousness: awake and alert Pain management: pain level controlled Vital Signs Assessment: post-procedure vital signs reviewed and stable Respiratory status: spontaneous breathing, nonlabored ventilation, respiratory function stable and patient connected to nasal cannula oxygen Cardiovascular status: blood pressure returned to baseline and stable Postop Assessment: no apparent nausea or vomiting Anesthetic complications: no   No complications documented.  Last Vitals:  Vitals:   10/14/20 0557 10/14/20 1458  BP: 113/61 (!) 149/71  Pulse: 78 88  Resp: 18 20  Temp: 36.6 C 37.1 C  SpO2: 95% 98%    Last Pain:  Vitals:   10/14/20 1810  TempSrc:   PainSc: 3                  Marelyn Rouser

## 2020-10-18 ENCOUNTER — Ambulatory Visit (INDEPENDENT_AMBULATORY_CARE_PROVIDER_SITE_OTHER): Payer: Medicare Other | Admitting: Podiatry

## 2020-10-18 ENCOUNTER — Other Ambulatory Visit: Payer: Self-pay

## 2020-10-18 ENCOUNTER — Inpatient Hospital Stay (HOSPITAL_COMMUNITY): Payer: Medicare Other

## 2020-10-18 ENCOUNTER — Inpatient Hospital Stay (HOSPITAL_COMMUNITY)
Admission: AD | Admit: 2020-10-18 | Discharge: 2020-10-22 | DRG: 629 | Disposition: A | Discharge status: Home Health | Payer: Medicare Other | Attending: Internal Medicine | Admitting: Internal Medicine

## 2020-10-18 VITALS — BP 129/68 | HR 68 | Temp 99.3°F

## 2020-10-18 DIAGNOSIS — I10 Essential (primary) hypertension: Secondary | ICD-10-CM | POA: Diagnosis not present

## 2020-10-18 DIAGNOSIS — Z7982 Long term (current) use of aspirin: Secondary | ICD-10-CM | POA: Diagnosis not present

## 2020-10-18 DIAGNOSIS — E11628 Type 2 diabetes mellitus with other skin complications: Principal | ICD-10-CM | POA: Diagnosis present

## 2020-10-18 DIAGNOSIS — L97422 Non-pressure chronic ulcer of left heel and midfoot with fat layer exposed: Secondary | ICD-10-CM | POA: Diagnosis not present

## 2020-10-18 DIAGNOSIS — F3340 Major depressive disorder, recurrent, in remission, unspecified: Secondary | ICD-10-CM | POA: Diagnosis present

## 2020-10-18 DIAGNOSIS — E1140 Type 2 diabetes mellitus with diabetic neuropathy, unspecified: Secondary | ICD-10-CM | POA: Diagnosis not present

## 2020-10-18 DIAGNOSIS — Z79899 Other long term (current) drug therapy: Secondary | ICD-10-CM

## 2020-10-18 DIAGNOSIS — E11649 Type 2 diabetes mellitus with hypoglycemia without coma: Secondary | ICD-10-CM | POA: Diagnosis present

## 2020-10-18 DIAGNOSIS — E876 Hypokalemia: Secondary | ICD-10-CM | POA: Diagnosis not present

## 2020-10-18 DIAGNOSIS — E11621 Type 2 diabetes mellitus with foot ulcer: Secondary | ICD-10-CM | POA: Diagnosis present

## 2020-10-18 DIAGNOSIS — M86172 Other acute osteomyelitis, left ankle and foot: Secondary | ICD-10-CM | POA: Diagnosis present

## 2020-10-18 DIAGNOSIS — L02612 Cutaneous abscess of left foot: Secondary | ICD-10-CM | POA: Diagnosis not present

## 2020-10-18 DIAGNOSIS — E1142 Type 2 diabetes mellitus with diabetic polyneuropathy: Secondary | ICD-10-CM | POA: Diagnosis not present

## 2020-10-18 DIAGNOSIS — Z20822 Contact with and (suspected) exposure to covid-19: Secondary | ICD-10-CM | POA: Diagnosis present

## 2020-10-18 DIAGNOSIS — L089 Local infection of the skin and subcutaneous tissue, unspecified: Secondary | ICD-10-CM | POA: Diagnosis not present

## 2020-10-18 DIAGNOSIS — F17210 Nicotine dependence, cigarettes, uncomplicated: Secondary | ICD-10-CM | POA: Diagnosis present

## 2020-10-18 DIAGNOSIS — Z89432 Acquired absence of left foot: Secondary | ICD-10-CM | POA: Diagnosis not present

## 2020-10-18 DIAGNOSIS — L97529 Non-pressure chronic ulcer of other part of left foot with unspecified severity: Secondary | ICD-10-CM | POA: Diagnosis present

## 2020-10-18 DIAGNOSIS — Z794 Long term (current) use of insulin: Secondary | ICD-10-CM | POA: Diagnosis not present

## 2020-10-18 DIAGNOSIS — B954 Other streptococcus as the cause of diseases classified elsewhere: Secondary | ICD-10-CM | POA: Diagnosis not present

## 2020-10-18 DIAGNOSIS — L03116 Cellulitis of left lower limb: Secondary | ICD-10-CM | POA: Diagnosis present

## 2020-10-18 DIAGNOSIS — F339 Major depressive disorder, recurrent, unspecified: Secondary | ICD-10-CM | POA: Diagnosis not present

## 2020-10-18 DIAGNOSIS — E1169 Type 2 diabetes mellitus with other specified complication: Secondary | ICD-10-CM | POA: Diagnosis present

## 2020-10-18 DIAGNOSIS — Z833 Family history of diabetes mellitus: Secondary | ICD-10-CM

## 2020-10-18 DIAGNOSIS — L0390 Cellulitis, unspecified: Secondary | ICD-10-CM | POA: Diagnosis present

## 2020-10-18 LAB — BASIC METABOLIC PANEL
Anion gap: 11 (ref 5–15)
BUN: 11 mg/dL (ref 6–20)
CO2: 27 mmol/L (ref 22–32)
Calcium: 8.2 mg/dL — ABNORMAL LOW (ref 8.9–10.3)
Chloride: 95 mmol/L — ABNORMAL LOW (ref 98–111)
Creatinine, Ser: 1.06 mg/dL (ref 0.61–1.24)
GFR, Estimated: >60 mL/min (ref >60)
Glucose, Bld: 99 mg/dL (ref 70–99)
Potassium: 3.2 mmol/L — ABNORMAL LOW (ref 3.5–5.1)
Sodium: 133 mmol/L — ABNORMAL LOW (ref 135–145)

## 2020-10-18 LAB — AEROBIC/ANAEROBIC CULTURE W GRAM STAIN (SURGICAL/DEEP WOUND): Gram Stain: NONE SEEN

## 2020-10-18 LAB — CBC
HCT: 32.8 % — ABNORMAL LOW (ref 39.0–52.0)
Hemoglobin: 11 g/dL — ABNORMAL LOW (ref 13.0–17.0)
MCH: 28.6 pg (ref 26.0–34.0)
MCHC: 33.5 g/dL (ref 30.0–36.0)
MCV: 85.4 fL (ref 80.0–100.0)
Platelets: 337 10*3/uL (ref 150–400)
RBC: 3.84 MIL/uL — ABNORMAL LOW (ref 4.22–5.81)
RDW: 12.7 % (ref 11.5–15.5)
WBC: 16.2 10*3/uL — ABNORMAL HIGH (ref 4.0–10.5)
nRBC: 0 % (ref 0.0–0.2)

## 2020-10-18 LAB — C-REACTIVE PROTEIN: CRP: 12.4 mg/dL — ABNORMAL HIGH (ref <1.0)

## 2020-10-18 LAB — GLUCOSE, CAPILLARY: Glucose-Capillary: 80 mg/dL (ref 70–99)

## 2020-10-18 IMAGING — MR MR ANKLE*L* W/O CM
8 series · 40 of 40 positions shown · non-contrast
Comparison: None.

CLINICAL DATA: Suspected osteomyelitis

EXAM:
MRI OF THE LEFT ANKLE WITHOUT CONTRAST
TECHNIQUE: Multiplanar, multisequence MR imaging of the ankle was performed. No
intravenous contrast was administered.

[Series 3: T1 · axial · left · 3.0mm · 0.53mm/px · z∈[-25,+124]mm · 5 of 44 slices shown (1 of 3)]
[im 1/44]
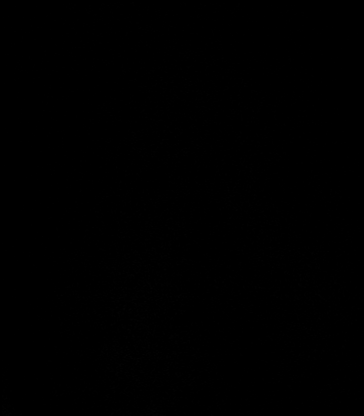
[im 11/44]
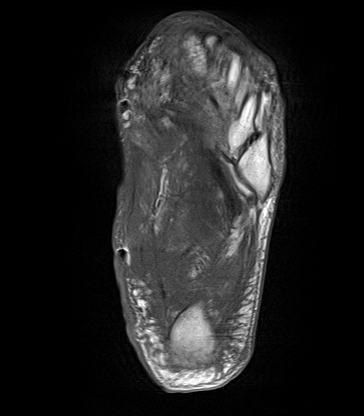
[im 22/44]
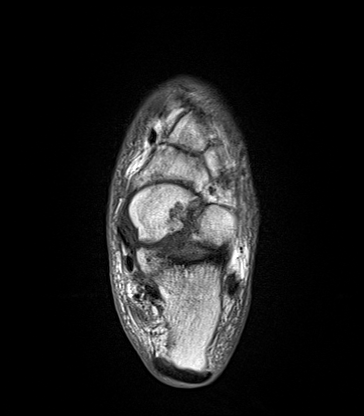
[im 33/44]
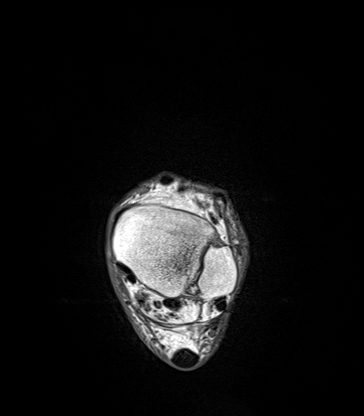
[im 44/44]
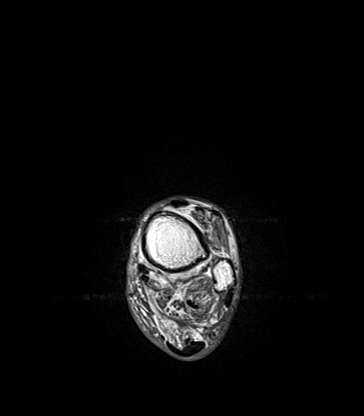

[Series 4: T2 fat-sat · axial · left · 3.0mm · 0.57mm/px · z∈[-24,+124]mm · 5 of 44 slices shown (1 of 4)]
[im 1/44]
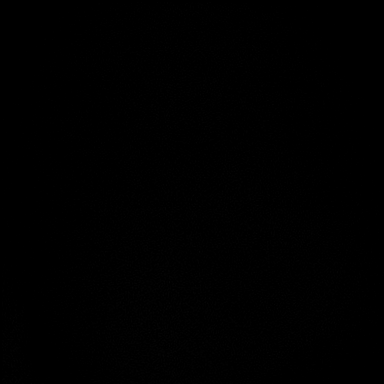
[im 11/44]
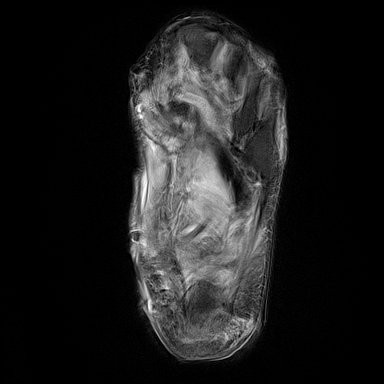
[im 22/44]
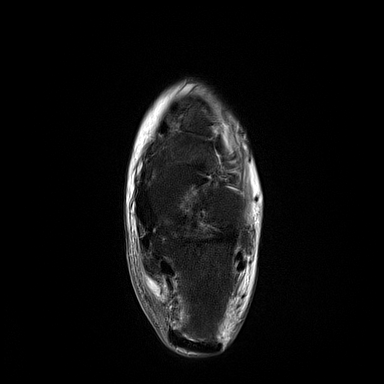
[im 33/44]
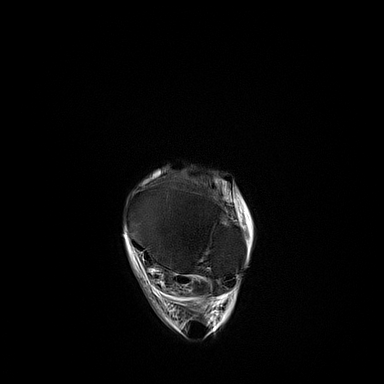
[im 44/44]
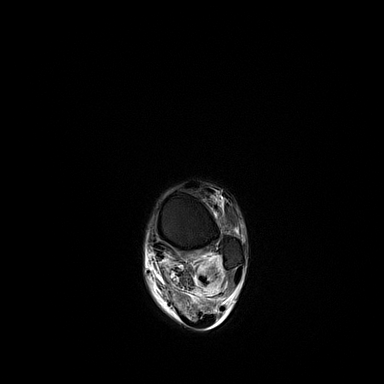

[Series 7: T1 · coronal · left · 3.0mm · 0.52mm/px · 6 of 52 slices shown (2 of 3)]
[im 1/52]
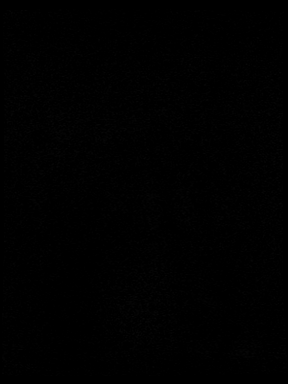
[im 11/52]
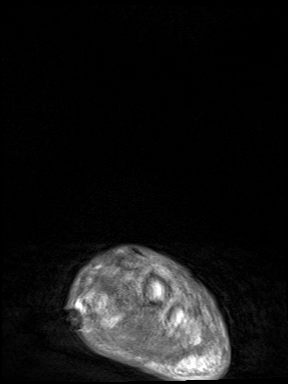
[im 21/52]
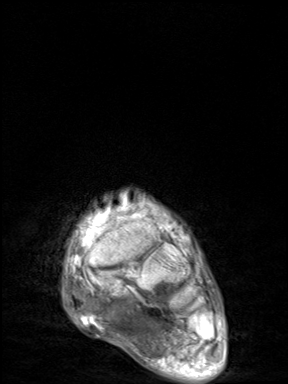
[im 31/52]
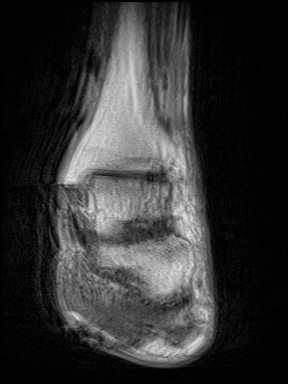
[im 41/52]
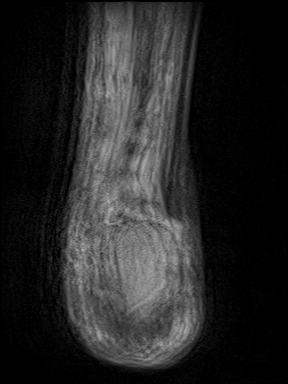
[im 52/52]
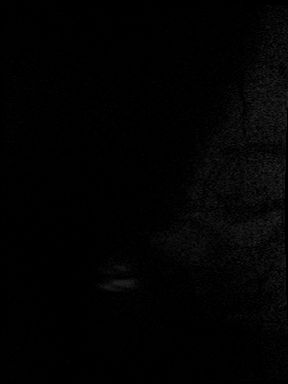

[Series 8: T2 fat-sat · coronal · left · 3.0mm · 0.57mm/px · 6 of 52 slices shown (2 of 4)]
[im 1/52]
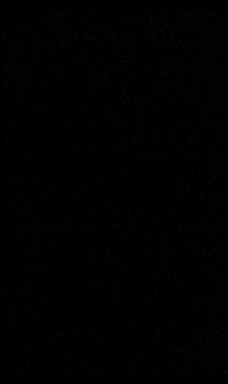
[im 11/52]
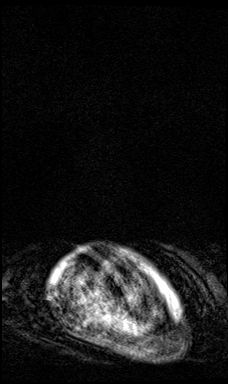
[im 21/52]
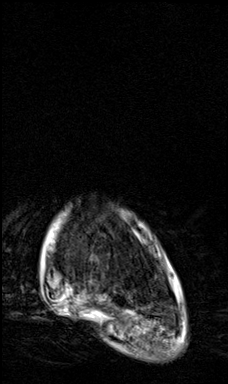
[im 31/52]
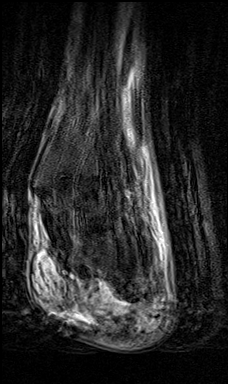
[im 41/52]
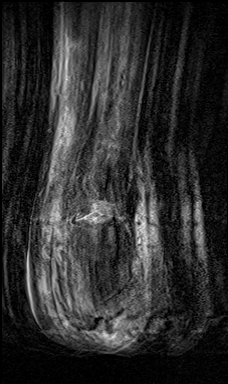
[im 52/52]
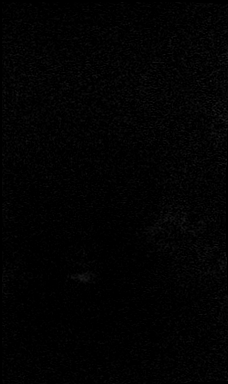

[Series 9: STIR · sagittal · left · 3.0mm · 0.82mm/px · 4 of 30 slices shown]
[im 1/30]
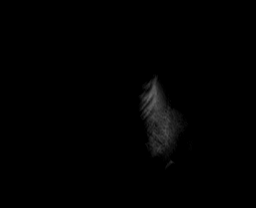
[im 10/30]
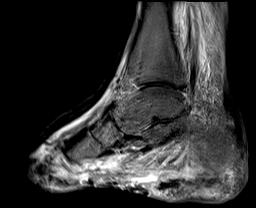
[im 20/30]
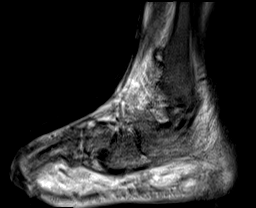
[im 30/30]
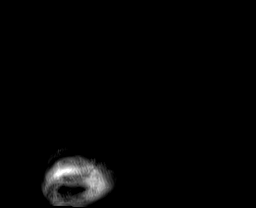

[Series 10: T1 · sagittal · left · 3.0mm · 0.55mm/px · 4 of 30 slices shown (3 of 3)]
[im 1/30]
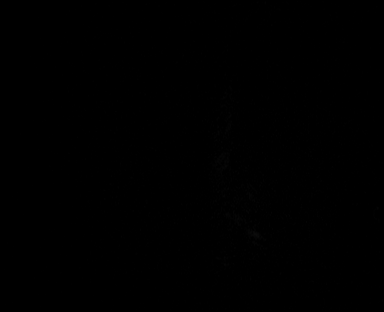
[im 10/30]
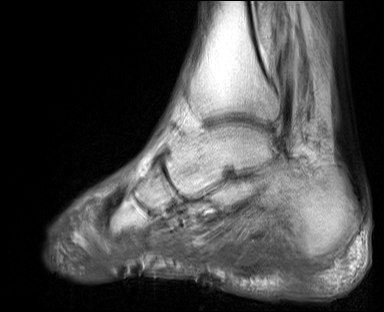
[im 20/30]
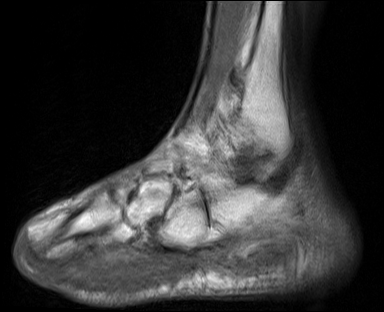
[im 30/30]
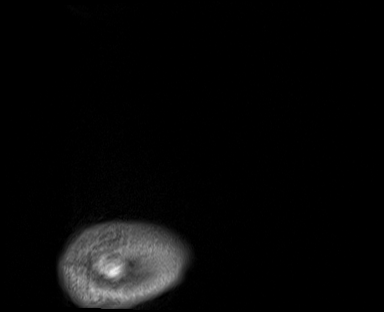

[Series 11: T2 fat-sat · coronal · left · 3.0mm · 0.57mm/px · 6 of 52 slices shown (3 of 4)]
[im 1/52]
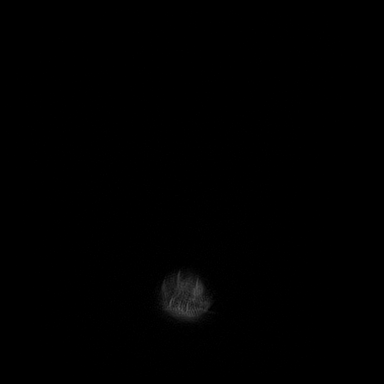
[im 11/52]
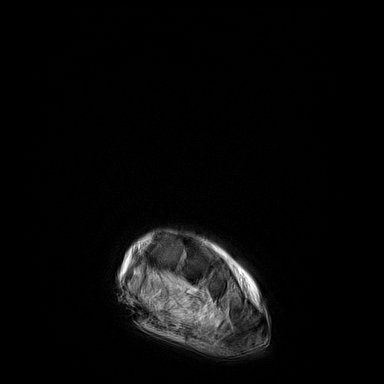
[im 21/52]
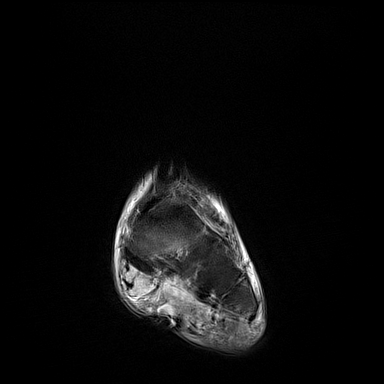
[im 31/52]
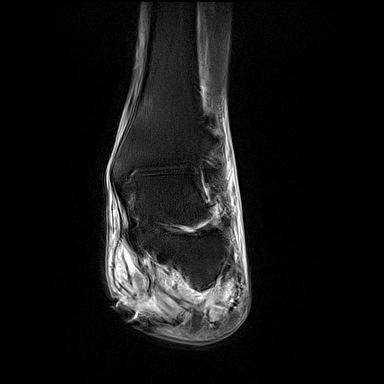
[im 41/52]
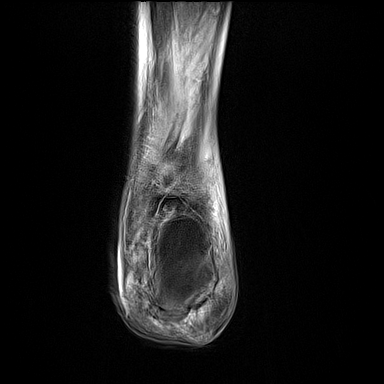
[im 52/52]
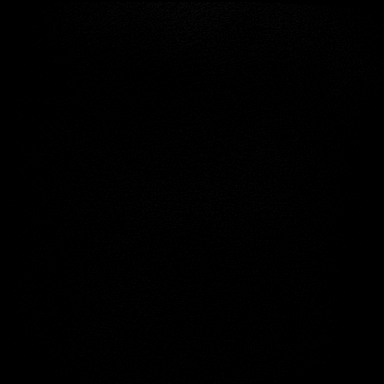

[Series 12: T2 fat-sat · sagittal · left · 3.0mm · 0.55mm/px · 4 of 30 slices shown (4 of 4)]
[im 1/30]
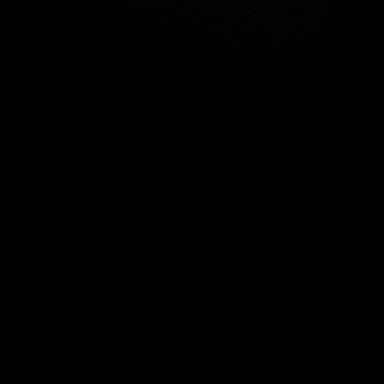
[im 10/30]
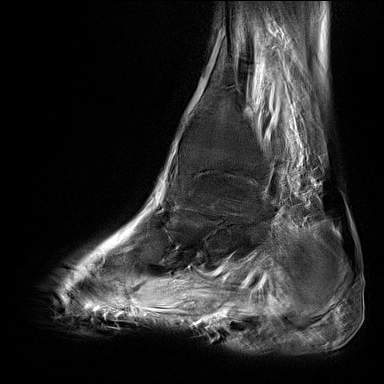
[im 20/30]
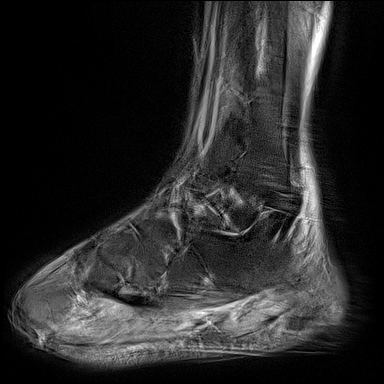
[im 30/30]
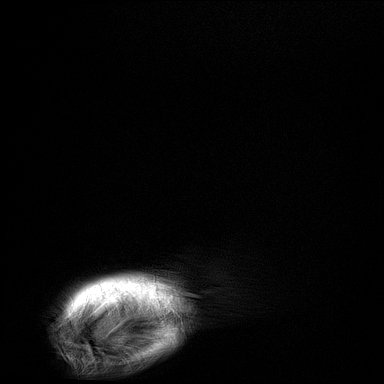

[40 of 40 positions shown; findings below may reference images not displayed]

FINDINGS: The study is limited due to patient motion.

Bones/Joint/Cartilage

The patient is status post transmetatarsal amputation. There appears
to be increased T2 hyperintense signal seen in the posterior
calcaneus with subtle T1 hypointensity. No large ankle joint
effusion.

Ligaments

The Lisfranc ligaments appear to be intact.

Muscles and Tendons
Increased signal with fatty atrophy seen within the muscles. The
visualized portions of the tendons appear to be grossly intact.
There is increased signal and thickening seen within the insertion
site of the Achilles tendon.

Soft tissue
Diffuse subcutaneous edema seen on the plantar surface with
overlying surgical table along the medial aspect of the foot. There
is increased signal within the middle cord of the plantar fascia
with a probable focal disruption at the midfoot which may be
postsurgical.
IMPRESSION: Findings which could be suggestive of early osteomyelitis along the
posterior calcaneus. No loculated fluid collections.

Diffuse heel pad edema/cellulitis.

Insertional Achilles tendinosis

## 2020-10-18 MED ORDER — ASPIRIN 81 MG PO CHEW
81.0000 mg | CHEWABLE_TABLET | Freq: Every day | ORAL | Status: DC
  Administered 2020-10-19 – 2020-10-22 (×3): 81 mg via ORAL
  Filled 2020-10-18 (×3): qty 1

## 2020-10-18 MED ORDER — OXYCODONE-ACETAMINOPHEN 10-325 MG PO TABS: 1.0000 | ORAL_TABLET | Freq: Four times a day (QID) | ORAL | Status: DC | PRN

## 2020-10-18 MED ORDER — SODIUM CHLORIDE 0.9 % IV SOLN
3.0000 g | Freq: Four times a day (QID) | INTRAVENOUS | Status: DC
  Administered 2020-10-18 – 2020-10-21 (×10): 3 g via INTRAVENOUS
  Filled 2020-10-18: qty 8
  Filled 2020-10-18 (×2): qty 3
  Filled 2020-10-18: qty 8
  Filled 2020-10-18: qty 3
  Filled 2020-10-18 (×5): qty 8
  Filled 2020-10-18 (×2): qty 3
  Filled 2020-10-18: qty 8
  Filled 2020-10-18: qty 3
  Filled 2020-10-18: qty 8

## 2020-10-18 MED ORDER — SODIUM CHLORIDE 0.9% FLUSH
3.0000 mL | Freq: Two times a day (BID) | INTRAVENOUS | Status: DC
  Administered 2020-10-19 – 2020-10-22 (×3): 3 mL via INTRAVENOUS

## 2020-10-18 MED ORDER — GABAPENTIN 300 MG PO CAPS: 300.0000 mg | ORAL_CAPSULE | Freq: Two times a day (BID) | ORAL | Status: DC

## 2020-10-18 MED ORDER — OXYCODONE HCL 5 MG PO TABS: 5.0000 mg | ORAL_TABLET | Freq: Four times a day (QID) | ORAL | Status: DC | PRN

## 2020-10-18 MED ORDER — OXYCODONE-ACETAMINOPHEN 5-325 MG PO TABS: 1.0000 | ORAL_TABLET | Freq: Four times a day (QID) | ORAL | Status: DC | PRN

## 2020-10-18 MED ORDER — NICOTINE 21 MG/24HR TD PT24
21.0000 mg | MEDICATED_PATCH | Freq: Every evening | TRANSDERMAL | Status: DC
  Administered 2020-10-19 – 2020-10-22 (×5): 21 mg via TRANSDERMAL
  Filled 2020-10-18 (×6): qty 1

## 2020-10-18 MED ORDER — INSULIN ASPART 100 UNIT/ML ~~LOC~~ SOLN
0.0000 [IU] | Freq: Three times a day (TID) | SUBCUTANEOUS | Status: DC
  Administered 2020-10-20: 8 [IU] via SUBCUTANEOUS
  Administered 2020-10-21 (×2): 2 [IU] via SUBCUTANEOUS
  Administered 2020-10-21: 3 [IU] via SUBCUTANEOUS
  Administered 2020-10-22 (×2): 2 [IU] via SUBCUTANEOUS

## 2020-10-18 MED ORDER — ATORVASTATIN CALCIUM 10 MG PO TABS
10.0000 mg | ORAL_TABLET | Freq: Every day | ORAL | Status: DC
  Administered 2020-10-19 – 2020-10-22 (×3): 10 mg via ORAL
  Filled 2020-10-18 (×3): qty 1

## 2020-10-18 MED ORDER — LISINOPRIL 20 MG PO TABS: 20.0000 mg | ORAL_TABLET | Freq: Every day | ORAL | Status: DC

## 2020-10-18 MED ORDER — INSULIN GLARGINE 100 UNIT/ML ~~LOC~~ SOLN
40.0000 [IU] | Freq: Two times a day (BID) | SUBCUTANEOUS | Status: DC
  Filled 2020-10-18 (×2): qty 0.4

## 2020-10-18 MED ORDER — SERTRALINE HCL 50 MG PO TABS
150.0000 mg | ORAL_TABLET | Freq: Every day | ORAL | Status: DC
  Administered 2020-10-19 – 2020-10-22 (×3): 150 mg via ORAL
  Filled 2020-10-18 (×3): qty 1

## 2020-10-18 MED ORDER — HYDROCHLOROTHIAZIDE 12.5 MG PO CAPS: 12.5000 mg | ORAL_CAPSULE | Freq: Every day | ORAL | Status: DC

## 2020-10-18 MED ORDER — ACETAMINOPHEN 325 MG PO TABS: 650.0000 mg | ORAL_TABLET | Freq: Four times a day (QID) | ORAL | Status: DC | PRN

## 2020-10-18 MED ORDER — POLYETHYLENE GLYCOL 3350 17 G PO PACK: 17.0000 g | PACK | Freq: Every day | ORAL | Status: DC | PRN

## 2020-10-18 MED ORDER — LISINOPRIL-HYDROCHLOROTHIAZIDE 20-12.5 MG PO TABS: 1.0000 | ORAL_TABLET | Freq: Every day | ORAL | Status: DC

## 2020-10-18 MED ORDER — OXYCODONE-ACETAMINOPHEN 5-325 MG PO TABS
2.0000 | ORAL_TABLET | Freq: Four times a day (QID) | ORAL | Status: DC | PRN
  Administered 2020-10-18 – 2020-10-22 (×7): 2 via ORAL
  Filled 2020-10-18 (×8): qty 2

## 2020-10-18 MED ORDER — CLONAZEPAM 1 MG PO TABS
1.0000 mg | ORAL_TABLET | Freq: Every day | ORAL | Status: DC
  Administered 2020-10-18 – 2020-10-21 (×4): 1 mg via ORAL
  Filled 2020-10-18 (×4): qty 1

## 2020-10-18 MED ORDER — ACETAMINOPHEN 650 MG RE SUPP: 650.0000 mg | Freq: Four times a day (QID) | RECTAL | Status: DC | PRN

## 2020-10-18 MED ORDER — AMLODIPINE BESYLATE 10 MG PO TABS
10.0000 mg | ORAL_TABLET | Freq: Every day | ORAL | Status: DC
  Administered 2020-10-19: 10 mg via ORAL
  Filled 2020-10-18: qty 1

## 2020-10-18 NOTE — H&P (Addendum)
History and Physical   Charles Thompson CMK:[id number] DOB: Jan 22, 1961 DOA: 10/18/2020  PCP: Gaspar Garbe, MD   Patient coming from: Home via podiatry  Chief Complaint: Left foot cellulitis and diabetic foot  HPI: Charles Thompson is a 59 y.o. male with medical history significant of previous diabetic foot infection, history of left transmetatarsal amputation, diabetes, depression who presents after being reevaluated at his podiatrist's office and found to have continued purulence at the medial aspect of his heel as well as a new area to lateral heel.  Patient reports symptoms have been fairly stable since discharge. He states his pain comes and goes but has not been more intense in the last couple days. He denies fever, nausea, chest pain, shortness of breath, abdominal pain, constipation, diarrhea.  He was previously admitted for his left foot cellulitis from 11/25-11/29.  He had an I&D performed by podiatry on 11/25 and again on 11/28.  His antibiotics have been deescalated to Unasyn by ID he was recommended to stay an extra day or so for further evaluation but was adamant on leaving on 11/29 and was discharged with a 10-day course of Levaquin. He was seen in his podiatrist office on 12/1 who noted increasing redness in the posterior portion of his foot and instructed patient to follow-up seen in two to to the ED if he experiences any systemic symptoms. He was seen again earlier today with purulence of his medial and lateral aspect of his heel. Podiatry contacted hospitalist service for direct admission for antibiotics, MRI and surgical intervention.  Review of Systems: As per HPI otherwise all other systems reviewed and are negative.  Past Medical History:  Diagnosis Date  . Anxiety   . Arthritis    neck  . Chronic ulcer of right foot (HCC)   . Diabetic peripheral neuropathy (HCC)   . First degree heart block   . GERD (gastroesophageal reflux disease)   . History of osteomyelitis     bilateral feet  . Hyperlipidemia   . Hypertension    followed by pcp  (03-07-2020 per pt never had a stress test)  . MDD (major depressive disorder)   . Type 2 diabetes mellitus with insulin therapy (HCC)    followed by pcp   (03-07-2020  pt stated checks blood sugar twice dialy,  fasting sugar--- 100-160)    Past Surgical History:  Procedure Laterality Date  . ALLOGRAFT APPLICATION Right 03/19/2020   Procedure: APPLICATION OF INTEGRA;  Surgeon: Park Liter, DPM;  Location: [facility];  Service: Podiatry;  Laterality: Right;  . AMPUTATION Left 06/22/2018   Procedure: LEFT FOOT FIRST RAY AMPUTATION;  Surgeon: Nadara Mustard, MD;  Location[facility] OR;  Service: Orthopedics;  Laterality: Left;  . AMPUTATION Left 09/09/2018   Procedure: Left Transmetatarsal Amputation;  Surgeon: Nadara Mustard, MD;  Location: [facility] OR;  Service: Orthopedics;  Laterality: Left;  . EYE SURGERY    . I & D EXTREMITY Left 10/13/2020   Procedure: IRRIGATION AND DEBRIDEMENT FOOT;  Surgeon: [name], DPM;  Location: [facility] OR;  Service: Podiatry;  Laterality: Left;  . I & D EXTREMITY Left 10/10/2020   Procedure: IRRIGATION AND DEBRIDEMENT OF FOOT;  Surgeon: [name], DPM;  Location: [facility] OR;  Service: Podiatry;  Laterality: Left;  . TENDON LENGTHENING Right 03/19/2020   Procedure: TENDOACHILLES LENGTHENING;  Surgeon: Park Liter, DPM;  Location: [facility];  Service: Podiatry;  Laterality: Right;  . TOE AMPUTATION Right 12-11-2015  @  [facility]   great toe  . WOUND DEBRIDEMENT Right 03/19/2020   Procedure: DEBRIDEMENT WOUND;  Surgeon: Park Liter, DPM;  Location: [facility];  Service: Podiatry;  Laterality: Right;    Social History  reports that he has been smoking cigarettes. He has been smoking about 0.75 packs per day. He has never used smokeless tobacco. He reports current alcohol use. He reports previous drug use. Drug: Marijuana.  No Known Allergies  Family  History  Problem Relation Age of Onset  . Diabetes Other   Reviewed on admission  Prior to Admission medications   Medication Sig Start Date End Date Taking? Authorizing Provider  amLODipine (NORVASC) 10 MG tablet Take 10 mg by mouth daily.  04/04/15  Yes [provider]  aspirin 81 MG tablet Take 81 mg by mouth daily.   Yes [provider]  atorvastatin (LIPITOR) 10 MG tablet Take 10 mg by mouth daily.  04/04/15  Yes [provider]  Lancets (ONETOUCH DELICA PLUS LANCET33G) MISC USE TO TEST BLOOD SUGAR THREE TIMES DAILY 01/09/20  Yes [provider]  levofloxacin (LEVAQUIN) 750 MG tablet Take 1 tablet (750 mg total) by mouth daily for 10 days. 10/14/20 10/24/20 Yes Almon Hercules, MD  lisinopril-hydrochlorothiazide (ZESTORETIC) 20-12.5 MG tablet Take 1 tablet by mouth daily. 10/14/20  Yes Almon Hercules, MD  ONETOUCH VERIO test strip USE TO TEST BLOOD SUGAR THREE TIMES DAILY 01/09/20  Yes [provider]  sertraline (ZOLOFT) 100 MG tablet TAKE 1 AND 1/2 TABLETS(150 MG) BY MOUTH DAILY 07/28/20  Yes Pucilowski, Olgierd A, MD  TOUJEO MAX SOLOSTAR 300 UNIT/ML SOPN Inject 70 Units into the skin 2 (two) times daily.  07/27/18  Yes [provider]  clonazePAM (KLONOPIN) 1 MG tablet Take 1 tablet (1 mg total) by mouth at bedtime. 07/31/20 10/29/20  Pucilowski, Roosvelt Maser, MD  fluticasone (FLONASE) 50 MCG/ACT nasal spray Place 1 spray into both nostrils 2 (two) times daily. 03/18/20   [provider]  gabapentin (NEURONTIN) 300 MG capsule Take 300 mg by mouth 2 (two) times daily.  10/08/15   [provider]  nicotine (NICODERM CQ - DOSED IN MG/24 HOURS) 21 mg/24hr patch Place 1 patch (21 mg total) onto the skin daily. 10/15/20   Almon Hercules, MD  omeprazole (PRILOSEC) 20 MG capsule Take 20 mg by mouth daily as needed (heartburn).  02/08/15   [provider]  oxyCODONE-acetaminophen (PERCOCET) 10-325 MG tablet Take 1 tablet by mouth  every 6 (six) hours as needed for up to 5 days for pain. 10/16/20 10/21/20  [name], DPM  SYNJARDY 12.03-999 MG TABS  03/16/20   [provider]    Physical Exam: Vitals:   10/18/20 1700 10/18/20 1753  BP:  106/63  Pulse:  72  Resp:  18  Temp:  98.9 F (37.2 C)  TempSrc:  Oral  SpO2:  99%  Weight: 97.3 kg   Height: 6' (1.829 m)    Physical Exam Constitutional:      General: He is not in acute distress.    Appearance: Normal appearance.  HENT:     Head: Normocephalic and atraumatic.     Mouth/Throat:     Mouth: Mucous membranes are moist.     Pharynx: Oropharynx is clear.  Eyes:     Extraocular Movements: Extraocular movements intact.     Pupils: Pupils are equal, round, and reactive to light.  Cardiovascular:     Rate and Rhythm: Normal  rate and regular rhythm.     Pulses: Normal pulses.     Heart sounds: Normal heart sounds.  Pulmonary:     Effort: Pulmonary effort is normal. No respiratory distress.     Breath sounds: Normal breath sounds.  Abdominal:     General: Bowel sounds are normal. There is no distension.     Palpations: Abdomen is soft.     Tenderness: There is no abdominal tenderness.  Musculoskeletal:        General: Deformity present. No swelling.     Comments: Status post multiple toe amputations on right foot.  Status post transmetatarsal amputation on left foot with left foot currently wrapped and bandaged.  Skin:    General: Skin is warm and dry.  Neurological:     General: No focal deficit present.     Mental Status: Mental status is at baseline.    Labs on Admission: I have personally reviewed following labs and imaging studies  CBC: Recent Labs  Lab 10/12/20 0121 10/13/20 0146 10/14/20 0301  WBC 17.4* 11.8* 12.8*  NEUTROABS 14.3* 9.2*  --   HGB 11.7* 11.7* 10.4*  HCT 35.4* 34.8* 31.2*  MCV 87.0 86.6 86.4  PLT 342 330 315    Basic Metabolic Panel: Recent Labs  Lab 10/12/20 0121 10/13/20 0146 10/14/20 0301  NA 136  136 135  K 4.3 4.0 4.0  CL 101 101 101  CO2 19* 20* 21*  GLUCOSE 129* 132* 174*  BUN 20 13 12   CREATININE 1.19 0.98 0.96  CALCIUM 8.9 8.4* 8.1*  MG 1.9 1.7 2.1  PHOS 2.2* 2.1* 2.0*    GFR: Estimated Creatinine Clearance: 99 mL/min (by C-G formula based on SCr of 0.96 mg/dL).  Liver Function Tests: Recent Labs  Lab 10/12/20 0121 10/13/20 0146 10/14/20 0301  ALBUMIN 2.5* 2.3* 2.2*    Urine analysis:    Component Value Date/Time   COLORURINE YELLOW 12/18/2007 1926   APPEARANCEUR CLEAR 12/18/2007 1926   LABSPEC 1.040 (H) 12/18/2007 1926   PHURINE 6.5 12/18/2007 1926   GLUCOSEU >1000 (A) 12/18/2007 1926   HGBUR NEGATIVE 12/18/2007 1926   HGBUR NEGATIVE POINT OF CARE RESULT 12/18/2007 1926   BILIRUBINUR NEGATIVE 12/18/2007 1926   KETONESUR 40 (A) 12/18/2007 1926   PROTEINUR NEGATIVE 12/18/2007 1926   UROBILINOGEN 1.0 12/18/2007 1926   NITRITE NEGATIVE 12/18/2007 1926   LEUKOCYTESUR NEGATIVE 12/18/2007 1926    Radiological Exams on Admission: No results found.  EKG: Not yet performed  Assessment/Plan Principal Problem:   Diabetic foot infection (HCC) Active Problems:   Diabetic ulcer of left foot associated with type 2 diabetes mellitus (HCC)   History of transmetatarsal amputation of left foot (HCC)   Major depressive disorder, recurrent episode, in remission with seasonal pattern (HCC)   Type 2 diabetes mellitus with diabetic neuropathy, with long-term current use of insulin (HCC)   Cellulitis  Left foot cellulitis Diabetic foot infection > History of left transmetatarsal hypertension > Recent admission with I&D x2 for cellulitis and diabetic foot infection. > Having worsening cellulitis and purulence on p.o. Levaquin outpatient, directly admitted from podiatrist office for IV antibiotics and surgical intervention - Start IV Unasyn every 6 hours for diabetic foot infection - MRI left foot and ankle - Percocet as needed for pain - Maintain diabetes  control - Monitor fever curve and white count, check BMP, CRP - SCDs for now, n.p.o. midnight - Repeat blood cultures  Diabetes > Patient takes between 50 and 70 units long-acting insulin twice  daily depending on his meal size. Has had evening insulin today. - Start 40 units Lantus twice daily - SSI  Hypertension > Soft blood pressures during recent admission - Continue amlodipine - Hold hydrochlorothiazide and lisinopril for now  Depression -Continue home Zoloft and clonazepam  DVT prophylaxis: SCDs  Code Status:   Fall  Family Communication:  None on admission Disposition Plan:   Patient is from:  Home  Anticipated DC to:  Pending course  Anticipated DC date:  Pending hospital course  Anticipated DC barriers: Home  Consults called:  Podiatry will see patient tomorrow as they requested admission Admission status:  Inpatient, MedSurg  Severity of Illness: The appropriate patient status for this patient is INPATIENT. Inpatient status is judged to be reasonable and necessary in order to provide the required intensity of service to ensure the patient's safety. The patient's presenting symptoms, physical exam findings, and initial radiographic and laboratory data in the context of their chronic comorbidities is felt to place them at high risk for further clinical deterioration. Furthermore, it is not anticipated that the patient will be medically stable for discharge from the hospital within 2 midnights of admission. The following factors support the patient status of inpatient.   " The patient's presenting symptoms include left foot cellulitis. " The worrisome physical exam findings include left foot cellulitis. " The initial radiographic and laboratory data are worrisome because of results pending. " The chronic co-morbidities include prior infection and amputation, diabetes.   * I certify that at the point of admission it is my clinical judgment that the patient will require  inpatient hospital care spanning beyond 2 midnights from the point of admission due to high intensity of service, high risk for further deterioration and high frequency of surveillance required.Synetta Fail MD [facility]  How to contact the [facility] Attending or Consulting provider 7A - 7P or covering provider during after hours 7P -7A, for this patient?   1. Check the care team in [facility] and look for a) attending/consulting [facility] provider listed and b) the [facility] team listed 2. Log into [email or web address] and use [facility]'s universal password to access. If you do not have the password, please contact the hospital operator. 3. Locate the [facility] provider you are looking for under [facility] and page to a number that you can be directly reached. 4. If you still have difficulty reaching the provider, please page the [facility] (Director on Call) for the Hospitalists listed on amion for assistance.  10/18/2020, 7:43 PM

## 2020-10-18 NOTE — Progress Notes (Signed)
MRI changed to WO contrast, pt unable to hold still. Motion degraded pre contrast MRI images only.

## 2020-10-18 NOTE — Progress Notes (Signed)
  Subjective:  Patient ID: Charles Thompson, male    DOB: August 17, 1961,  MRN: [id number]  Chief Complaint  Patient presents with  . Wound Check    Pt states no new concerns, pt denies fever/nausea/vomiting/chills.   59 y.o. male presents for wound care. Hx confirmed with patient.  Objective:  Physical Exam:  Surgical incision well-appearing Wound Location: left foot and heel Peri-wound: Reddened, Calloused Exudate: Moderate amount Purulent exudate + induration, + fluctuance, + tenderness and erythema noted along wound margins extending 5 cm  Vitals:   10/18/20 1152  BP: 129/68  Pulse: 68  Temp: 99.3 F (37.4 C)       Assessment:   1. Abscess of left heel   2. Cellulitis of left foot   3. Diabetic foot infection (HCC)   4. History of transmetatarsal amputation of left foot (HCC)     Plan:  Patient was evaluated and treated and all questions answered.  Ulcer left heel -Continued purulence at the medial aspect of the heel as well as new area of lateral heel. Area incised with a scissors and blistered areas partially deroofed. Dressed with -Discussed with patient he will need re-admission to the hospital for IV Abx. He will need surgery for the infection to his left heel and foot. -Contacted [facility] for direct admission. Dr. Chipper Herb to accept. Called bedboard for admission. Patient is stable for waiting from home. Will send patient home and he will be called when bed is available. -Will need MRI left ankle and foot on admission -Empiric abx  No follow-ups on file.

## 2020-10-18 NOTE — Progress Notes (Signed)
Pt admitted to unit from MD office. Attending paged for orders. Pt is alert and oriented to person and place and time. No signs of acute distress. No resp distress. Lung sounds are clear and he is on RA. Skin is warm and dry. Surgical incision noted to Left foot. Old dressing with moderate amount of serosanguineous fluid noted. Old Right great toe amputation noted. Pt oriented to room and staff.

## 2020-10-18 NOTE — Progress Notes (Signed)
12/03 2106 Pt taken down to MRI by Shawn, Pt stable and oriented. Pt stated pain level of 4 on the scale of 0-10. Pt refused pain medication. Pt observed to be comfortable.

## 2020-10-19 ENCOUNTER — Encounter (HOSPITAL_COMMUNITY): Payer: Self-pay | Admitting: Internal Medicine

## 2020-10-19 DIAGNOSIS — L03116 Cellulitis of left lower limb: Secondary | ICD-10-CM | POA: Diagnosis not present

## 2020-10-19 DIAGNOSIS — E11621 Type 2 diabetes mellitus with foot ulcer: Secondary | ICD-10-CM | POA: Diagnosis not present

## 2020-10-19 DIAGNOSIS — Z794 Long term (current) use of insulin: Secondary | ICD-10-CM

## 2020-10-19 DIAGNOSIS — E11628 Type 2 diabetes mellitus with other skin complications: Principal | ICD-10-CM

## 2020-10-19 DIAGNOSIS — Z89432 Acquired absence of left foot: Secondary | ICD-10-CM

## 2020-10-19 DIAGNOSIS — E1140 Type 2 diabetes mellitus with diabetic neuropathy, unspecified: Secondary | ICD-10-CM

## 2020-10-19 DIAGNOSIS — F3340 Major depressive disorder, recurrent, in remission, unspecified: Secondary | ICD-10-CM | POA: Diagnosis not present

## 2020-10-19 DIAGNOSIS — L089 Local infection of the skin and subcutaneous tissue, unspecified: Secondary | ICD-10-CM | POA: Diagnosis not present

## 2020-10-19 DIAGNOSIS — L97422 Non-pressure chronic ulcer of left heel and midfoot with fat layer exposed: Secondary | ICD-10-CM

## 2020-10-19 LAB — GLUCOSE, CAPILLARY
Glucose-Capillary: 75 mg/dL (ref 70–99)
Glucose-Capillary: 63 mg/dL — ABNORMAL LOW (ref 70–99)
Glucose-Capillary: 79 mg/dL (ref 70–99)
Glucose-Capillary: 103 mg/dL — ABNORMAL HIGH (ref 70–99)
Glucose-Capillary: 142 mg/dL — ABNORMAL HIGH (ref 70–99)

## 2020-10-19 LAB — BASIC METABOLIC PANEL
Anion gap: 14 (ref 5–15)
BUN: 10 mg/dL (ref 6–20)
CO2: 28 mmol/L (ref 22–32)
Calcium: 8.4 mg/dL — ABNORMAL LOW (ref 8.9–10.3)
Chloride: 98 mmol/L (ref 98–111)
Creatinine, Ser: 0.95 mg/dL (ref 0.61–1.24)
GFR, Estimated: >60 mL/min (ref >60)
Glucose, Bld: 85 mg/dL (ref 70–99)
Potassium: 2.9 mmol/L — ABNORMAL LOW (ref 3.5–5.1)
Sodium: 140 mmol/L (ref 135–145)

## 2020-10-19 LAB — CULTURE, BLOOD (ROUTINE X 2)
Culture: NO GROWTH
Culture: NO GROWTH
Special Requests: ADEQUATE
Special Requests: ADEQUATE

## 2020-10-19 LAB — CBC
HCT: 30.3 % — ABNORMAL LOW (ref 39.0–52.0)
Hemoglobin: 10.6 g/dL — ABNORMAL LOW (ref 13.0–17.0)
MCH: 29.6 pg (ref 26.0–34.0)
MCHC: 35 g/dL (ref 30.0–36.0)
MCV: 84.6 fL (ref 80.0–100.0)
Platelets: 286 10*3/uL (ref 150–400)
RBC: 3.58 MIL/uL — ABNORMAL LOW (ref 4.22–5.81)
RDW: 12.8 % (ref 11.5–15.5)
WBC: 14.6 10*3/uL — ABNORMAL HIGH (ref 4.0–10.5)
nRBC: 0 % (ref 0.0–0.2)

## 2020-10-19 MED ORDER — VANCOMYCIN HCL 10 G IV SOLR
2250.0000 mg | Freq: Once | INTRAVENOUS | Status: AC
  Administered 2020-10-19: 2250 mg via INTRAVENOUS
  Filled 2020-10-19 (×2): qty 2250

## 2020-10-19 MED ORDER — POTASSIUM CHLORIDE 10 MEQ/100ML IV SOLN
10.0000 meq | INTRAVENOUS | Status: AC
  Administered 2020-10-19 (×4): 10 meq via INTRAVENOUS
  Filled 2020-10-19 (×4): qty 100

## 2020-10-19 MED ORDER — DEXTROSE 50 % IV SOLN
INTRAVENOUS | Status: AC
  Administered 2020-10-19: 12.5 mL
  Filled 2020-10-19: qty 50

## 2020-10-19 MED ORDER — VANCOMYCIN HCL IN DEXTROSE 1-5 GM/200ML-% IV SOLN
1000.0000 mg | Freq: Three times a day (TID) | INTRAVENOUS | Status: DC
  Administered 2020-10-19: 1000 mg via INTRAVENOUS
  Filled 2020-10-19 (×3): qty 200

## 2020-10-19 MED ORDER — POTASSIUM CHLORIDE CRYS ER 20 MEQ PO TBCR
40.0000 meq | EXTENDED_RELEASE_TABLET | Freq: Once | ORAL | Status: AC
  Administered 2020-10-19: 40 meq via ORAL
  Filled 2020-10-19: qty 2

## 2020-10-19 NOTE — Progress Notes (Signed)
Hypoglycemic Event  CBG: 63 at 11:40  Treatment: D50 25 mL (12.5 gm) at 11:52  Symptoms: None  Follow-up CBG: Time: 12:24 CBG Result:79  Possible Reasons for Event: NPO for surgery today  Comments/MD notified: Message sent to Dr. Ella Jubilee. Will monitor.    Peggye Ley

## 2020-10-19 NOTE — Plan of Care (Signed)
?  Problem: Clinical Measurements: ?Goal: Ability to avoid or minimize complications of infection will improve ?Outcome: Progressing ?  ?Problem: Skin Integrity: ?Goal: Skin integrity will improve ?Outcome: Progressing ?  ?

## 2020-10-19 NOTE — Progress Notes (Signed)
Subjective:  Patient ID: Charles Thompson, male    DOB: February 12, 1961,  MRN: [id number]  Patient seen bedside. Surgery was rescheduled. Pain controlled. Voices no complaints. Objective:   Vitals:   10/19/20 0812 10/19/20 1518  BP: (!) 126/59 138/65  Pulse: 76 79  Resp: 17 17  Temp: 99 F (37.2 C) 97.8 F (36.6 C)  SpO2: 97% 97%   Results for orders placed or performed during the hospital encounter of 10/18/20 (from the past 24 hour(s))  Basic metabolic panel     Status: Abnormal   Collection Time: 10/18/20  7:53 PM  Result Value Ref Range   Sodium 133 (L) 135 - 145 mmol/L   Potassium 3.2 (L) 3.5 - 5.1 mmol/L   Chloride 95 (L) 98 - 111 mmol/L   CO2 27 22 - 32 mmol/L   Glucose, Bld 99 70 - 99 mg/dL   BUN 11 6 - 20 mg/dL   Creatinine, Ser 2.33 0.61 - 1.24 mg/dL   Calcium 8.2 (L) 8.9 - 10.3 mg/dL   GFR, Estimated >43 >56 mL/min   Anion gap 11 5 - 15  CBC     Status: Abnormal   Collection Time: 10/18/20  7:53 PM  Result Value Ref Range   WBC 16.2 (H) 4.0 - 10.5 K/uL   RBC 3.84 (L) 4.22 - 5.81 MIL/uL   Hemoglobin 11.0 (L) 13.0 - 17.0 g/dL   HCT 86.1 (L) 39 - 52 %   MCV 85.4 80.0 - 100.0 fL   MCH 28.6 26.0 - 34.0 pg   MCHC 33.5 30.0 - 36.0 g/dL   RDW 68.3 72.9 - 02.1 %   Platelets 337 150 - 400 K/uL   nRBC 0.0 0.0 - 0.2 %  Culture, blood (routine x 2)     Status: None (Preliminary result)   Collection Time: 10/18/20  7:53 PM   Specimen: BLOOD LEFT ARM  Result Value Ref Range   Specimen Description BLOOD LEFT ARM    Special Requests      BOTTLES DRAWN AEROBIC AND ANAEROBIC Blood Culture results may not be optimal due to an excessive volume of blood received in culture bottles   Culture      NO GROWTH < 12 HOURS Performed at [facility], [address] [place], [place] [address]    Report Status PENDING   Culture, blood (routine x 2)     Status: None (Preliminary result)   Collection Time: 10/18/20  7:53 PM   Specimen: BLOOD RIGHT ARM  Result Value Ref Range    Specimen Description BLOOD RIGHT ARM    Special Requests      BOTTLES DRAWN AEROBIC AND ANAEROBIC Blood Culture results may not be optimal due to an excessive volume of blood received in culture bottles   Culture      NO GROWTH < 12 HOURS Performed at [facility], [address] [place], [place] [address]    Report Status PENDING   C-reactive protein     Status: Abnormal   Collection Time: 10/18/20  7:53 PM  Result Value Ref Range   CRP 12.4 (H) <1.0 mg/dL  Glucose, capillary     Status: None   Collection Time: 10/18/20  8:56 PM  Result Value Ref Range   Glucose-Capillary 80 70 - 99 mg/dL   Comment 1 Document in Chart   Basic metabolic panel     Status: Abnormal   Collection Time: 10/19/20  2:09 AM  Result Value Ref Range  Sodium 140 135 - 145 mmol/L   Potassium 2.9 (L) 3.5 - 5.1 mmol/L   Chloride 98 98 - 111 mmol/L   CO2 28 22 - 32 mmol/L   Glucose, Bld 85 70 - 99 mg/dL   BUN 10 6 - 20 mg/dL   Creatinine, Ser 9.67 0.61 - 1.24 mg/dL   Calcium 8.4 (L) 8.9 - 10.3 mg/dL   GFR, Estimated >89 >38 mL/min   Anion gap 14 5 - 15  CBC     Status: Abnormal   Collection Time: 10/19/20  2:09 AM  Result Value Ref Range   WBC 14.6 (H) 4.0 - 10.5 K/uL   RBC 3.58 (L) 4.22 - 5.81 MIL/uL   Hemoglobin 10.6 (L) 13.0 - 17.0 g/dL   HCT 10.1 (L) 39 - 52 %   MCV 84.6 80.0 - 100.0 fL   MCH 29.6 26.0 - 34.0 pg   MCHC 35.0 30.0 - 36.0 g/dL   RDW 75.1 02.5 - 85.2 %   Platelets 286 150 - 400 K/uL   nRBC 0.0 0.0 - 0.2 %  Glucose, capillary     Status: None   Collection Time: 10/19/20  6:36 AM  Result Value Ref Range   Glucose-Capillary 75 70 - 99 mg/dL  Glucose, capillary     Status: Abnormal   Collection Time: 10/19/20 11:40 AM  Result Value Ref Range   Glucose-Capillary 63 (L) 70 - 99 mg/dL  Glucose, capillary     Status: None   Collection Time: 10/19/20 12:24 PM  Result Value Ref Range   Glucose-Capillary 79 70 - 99 mg/dL  Glucose, capillary     Status: Abnormal   Collection  Time: 10/19/20  4:27 PM  Result Value Ref Range   Glucose-Capillary 103 (H) 70 - 99 mg/dL   Results for orders placed or performed during the hospital encounter of 10/18/20  Culture, blood (routine x 2)     Status: None (Preliminary result)   Collection Time: 10/18/20  7:53 PM   Specimen: BLOOD LEFT ARM  Result Value Ref Range Status   Specimen Description BLOOD LEFT ARM  Final   Special Requests   Final    BOTTLES DRAWN AEROBIC AND ANAEROBIC Blood Culture results may not be optimal due to an excessive volume of blood received in culture bottles   Culture   Final    NO GROWTH < 12 HOURS Performed at [facility], [address] [place], [place] [address]    Report Status PENDING  Incomplete  Culture, blood (routine x 2)     Status: None (Preliminary result)   Collection Time: 10/18/20  7:53 PM   Specimen: BLOOD RIGHT ARM  Result Value Ref Range Status   Specimen Description BLOOD RIGHT ARM  Final   Special Requests   Final    BOTTLES DRAWN AEROBIC AND ANAEROBIC Blood Culture results may not be optimal due to an excessive volume of blood received in culture bottles   Culture   Final    NO GROWTH < 12 HOURS Performed at [facility], [address] [place], [place] [address]    Report Status PENDING  Incomplete    Dressing left intact. C/D/I without strikethrough.  Assessment & Plan:  Patient was evaluated and treated and all questions answered.  Left foot wound/abscess with concern for OM -Rescheduled surgery to tomorrow AM. -Plan for debridement both left foot wounds, possible calcaneus bone bx. -Continue empiric IV abx. -WBAT LLE in Boot -Will continue  to follow  Park Liter, DPM  Accessible via secure chat for questions or concerns.

## 2020-10-19 NOTE — Progress Notes (Addendum)
Pharmacy Antibiotic Note  Charles Thompson is a 59 y.o. male admitted on 10/18/2020 with L foot cellulitis/diabetic foot.  Patient had recent hospitalization from 10/10/2020 to 10/14/2020 for L foot cellulitis s/p I&D and receiving Unasyn inpatient and discharged on levofloxacin.   Patient presented on 10/18/2020 with purulence on heel. MRI obtained findings suggestive of early osteomyelitis along posterior calcaneus. Pharmacy has been consulted for vancomycin dosing for osteomyelitis. WBC 14.6. Afebrile. Scr 0.95 with current CrCl of 156ml/min appears to be at baseline  Plan: Vanc 2250mg  x1 loading dose  Start vancomycin 1000mg  IV q8h  Continue Unasyn per MD Monitor renal function, vancomycin levels, cultures/sensitivities, and clinical progression  Height: 6' (182.9 cm) Weight: 97.3 kg (214 lb 8.1 oz) IBW/kg (Calculated) : 77.6  Temp (24hrs), Avg:98.9 F (37.2 C), Min:98.7 F (37.1 C), Max:99 F (37.2 C)  Recent Labs  Lab 10/13/20 0146 10/14/20 0301 10/18/20 1953 10/19/20 0209  WBC 11.8* 12.8* 16.2* 14.6*  CREATININE 0.98 0.96 1.06 0.95    Estimated Creatinine Clearance: 100 mL/min (by C-G formula based on SCr of 0.95 mg/dL).    No Known Allergies  Antimicrobials this admission: Unasyn 12/3 >> Vancomycin 12/4 >>  Dose adjustments this admission: N/a  Microbiology results: 12/3 bcx: ngtd  Thank you for allowing pharmacy to be a part of this patient's care.  14/4, PharmD Clinical Pharmacist  10/19/2020 2:21 PM

## 2020-10-19 NOTE — Progress Notes (Signed)
PROGRESS NOTE    Charles Thompson  KCL:[id number] DOB: 06-12-1961 DOA: 10/18/2020 PCP: Gaspar Garbe, MD    Brief Narrative:  Charles Thompson was admitted to the hospital with the working diagnosis of left diabetic foot infection, posterior calcaneus osteomyelitis.  59 year old male with past medical history for type 2 diabetes mellitus, diabetic foot status post left transmetatarsal amputation, and depression.  He was directly admitted from the outpatient podiatry clinic for a worsening left foot wound.    Patient had a recent hospitalization for left foot cellulitis, 10/10/2020-10/14/2020, when he received IV antibiotic therapy and underwent incision and drainage.  He was discharged on oral levofloxacin.  On 10/16/2020 he was seen at the outpatient podiatry clinic with worsening erythema, and edema at his left foot, instructed to continue oral antibiotics.  2 days later on 10/18/2020 he noticed purulent material coming from the lateral aspect of his left heel, he was evaluated at the podiatry clinic and was referred for direct admission. On his physical examination his blood pressure was 106/63, heart rate 72, respiratory rate 18, temperature 98.9, oxygen saturation 99%, his lungs are clear to auscultation bilaterally, heart S1-S2, present rhythmic, soft abdomen, no lower extremity edema.  He had multiple amputation on his right foot, transmetatarsal amputation left foot, large ulcerated lesion at his heel.  Sodium 133, potassium 3.2, chloride 95, bicarb 27, glucose 99, BUN 11, creatinine 1.0, white count 16.2, hemoglobin 11.0, Hct 32.8, platelets 337. White count 16.2, hemoglobin 11.0, hematocrit 32.8, platelets 337.  Foot MRI with findings suggesting posterior calcaneous osteomyelitis.   Assessment & Plan:   Principal Problem:   Diabetic foot infection (HCC) Active Problems:   Diabetic ulcer of left foot associated with type 2 diabetes mellitus (HCC)   History of transmetatarsal  amputation of left foot (HCC)   Major depressive disorder, recurrent episode, in remission with seasonal pattern (HCC)   Type 2 diabetes mellitus with diabetic neuropathy, with long-term current use of insulin (HCC)   Cellulitis   1. Left diabetic foot infection, posterior calcaneous osteomyelitis.  Wbc is down to 14,6, blood cultures are with no growth.  Cultures from prior hospitalization from left foot abscess/ blood positive for streptococcus intermedius, sensitive to pnc, cephalosporins and levofloxacin.  Continue antibiotic therapy with Unasyn for now and will add MRSA coverage with vancomycin.  Follow up on new cultures, cell count and temperature curve.   Continue pain control with    2. T2DM with hypoglycemia/ dyslipidemia. Fasting glucose this am 85, capillary 80, 75, 63, 79. Patient is npo for surgical procedure, will discontinue basal insulin and will continue insulin sliding scale.   Continue with atorvastatin.   If persistent hypoglycemia, will add IV dextrose to IV fluids.   3. HTN. Blood pressure systolic 90 to 100 mmHg, will discontinue amlodipine for now.   4. Hypokalemia. Renal function with serum cr at 0,95 with K at 2,9 and bicarbonate at 28. Add 40 meq Kcl po and 40 kcl IV, follow up on renal function and electrolytes in am.  5. Depression/ tobacco abuse. Continue with sertraline and clonazepam.  Continue smoking cessation, nicotine patch.   Patient continue to be at high risk for worsening diabetic foot infection, hypoglycemia and electrolyte abnormalities.   Status is: Inpatient  Remains inpatient appropriate because:IV treatments appropriate due to intensity of illness or inability to take PO   Dispo: The patient is from: Home              Anticipated d/c is to: Home  Anticipated d/c date is: 3 days              Patient currently is not medically stable to d/c.   DVT prophylaxis: Enoxaparin   Code Status:   full  Family Communication:   No family at the bedside     Consultants:   Podiatry   Procedures:     Antimicrobials:   unasyn  Vancomycin     Subjective: Patient with no significant pain at his left foot, no nausea or vomiting, has been npo in preparation for surgical intervention, had episodic hypoglycemia,.   Objective: Vitals:   10/18/20 1753 10/18/20 2300 10/19/20 0400 10/19/20 0812  BP: 106/63 (!) 99/56 110/66 (!) 126/59  Pulse: 72 76 79 76  Resp: 18 18 18 17   Temp: 98.9 F (37.2 C) 98.9 F (37.2 C) 98.7 F (37.1 C) 99 F (37.2 C)  TempSrc: Oral Oral Oral Oral  SpO2: 99% 98% 100% 97%  Weight:      Height:        Intake/Output Summary (Last 24 hours) at 10/19/2020 1338 Last data filed at 10/19/2020 0500 Gross per 24 hour  Intake 600 ml  Output 800 ml  Net -200 ml   Filed Weights   10/18/20 1700  Weight: 97.3 kg    Examination:   General: Not in pain or dyspnea,. Deconditioned  Neurology: Awake and alert, non focal  E ENT: mild pallor, no icterus, oral mucosa moist Cardiovascular: No JVD. S1-S2 present, rhythmic, no gallops, rubs, or murmurs. No lower extremity edema. Pulmonary: positive breath sounds bilaterally,  Gastrointestinal. Abdomen soft and non tender Skin. Left foot with dressing in place.  Musculoskeletal: no joint deformities     Data Reviewed: I have personally reviewed following labs and imaging studies  CBC: Recent Labs  Lab 10/13/20 0146 10/14/20 0301 10/18/20 1953 10/19/20 0209  WBC 11.8* 12.8* 16.2* 14.6*  NEUTROABS 9.2*  --   --   --   HGB 11.7* 10.4* 11.0* 10.6*  HCT 34.8* 31.2* 32.8* 30.3*  MCV 86.6 86.4 85.4 84.6  PLT 330 315 337 286   Basic Metabolic Panel: Recent Labs  Lab 10/13/20 0146 10/14/20 0301 10/18/20 1953 10/19/20 0209  NA 136 135 133* 140  K 4.0 4.0 3.2* 2.9*  CL 101 101 95* 98  CO2 20* 21* 27 28  GLUCOSE 132* 174* 99 85  BUN 13 12 11 10   CREATININE 0.98 0.96 1.06 0.95  CALCIUM 8.4* 8.1* 8.2* 8.4*  MG 1.7 2.1  --    --   PHOS 2.1* 2.0*  --   --    GFR: Estimated Creatinine Clearance: 100 mL/min (by C-G formula based on SCr of 0.95 mg/dL). Liver Function Tests: Recent Labs  Lab 10/13/20 0146 10/14/20 0301  ALBUMIN 2.3* 2.2*   No results for input(s): LIPASE, AMYLASE in the last 168 hours. No results for input(s): AMMONIA in the last 168 hours. Coagulation Profile: No results for input(s): INR, PROTIME in the last 168 hours. Cardiac Enzymes: No results for input(s): CKTOTAL, CKMB, CKMBINDEX, TROPONINI in the last 168 hours. BNP (last 3 results) No results for input(s): PROBNP in the last 8760 hours. HbA1C: No results for input(s): HGBA1C in the last 72 hours. CBG: Recent Labs  Lab 10/14/20 1705 10/18/20 2056 10/19/20 0636 10/19/20 1140 10/19/20 1224  GLUCAP 146* 80 75 63* 79   Lipid Profile: No results for input(s): CHOL, HDL, LDLCALC, TRIG, CHOLHDL, LDLDIRECT in the last 72 hours. Thyroid Function Tests: No results  for input(s): TSH, T4TOTAL, FREET4, T3FREE, THYROIDAB in the last 72 hours. Anemia Panel: No results for input(s): VITAMINB12, FOLATE, FERRITIN, TIBC, IRON, RETICCTPCT in the last 72 hours.    Radiology Studies: I have reviewed all of the imaging during this hospital visit personally     Scheduled Meds: . amLODipine  10 mg Oral Daily  . aspirin  81 mg Oral Daily  . atorvastatin  10 mg Oral Daily  . clonazePAM  1 mg Oral QHS  . insulin aspart  0-15 Units Subcutaneous TID WC  . nicotine  21 mg Transdermal QPM  . sertraline  150 mg Oral Daily  . sodium chloride flush  3 mL Intravenous Q12H   Continuous Infusions: . ampicillin-sulbactam (UNASYN) IV 3 g (10/19/20 1155)     LOS: 1 day        Hanah Moultry Annett Gula, MD

## 2020-10-20 ENCOUNTER — Inpatient Hospital Stay (HOSPITAL_COMMUNITY): Payer: Medicare Other | Admitting: Certified Registered"

## 2020-10-20 ENCOUNTER — Encounter (HOSPITAL_COMMUNITY): Payer: Self-pay | Admitting: Internal Medicine

## 2020-10-20 ENCOUNTER — Encounter (HOSPITAL_COMMUNITY)
Admission: AD | Disposition: A | Discharge status: Home Health | Payer: Self-pay | Source: Home / Self Care | Attending: Internal Medicine

## 2020-10-20 DIAGNOSIS — L089 Local infection of the skin and subcutaneous tissue, unspecified: Secondary | ICD-10-CM | POA: Diagnosis not present

## 2020-10-20 DIAGNOSIS — M86172 Other acute osteomyelitis, left ankle and foot: Secondary | ICD-10-CM

## 2020-10-20 DIAGNOSIS — E11628 Type 2 diabetes mellitus with other skin complications: Secondary | ICD-10-CM | POA: Diagnosis not present

## 2020-10-20 DIAGNOSIS — L03116 Cellulitis of left lower limb: Secondary | ICD-10-CM | POA: Diagnosis not present

## 2020-10-20 DIAGNOSIS — Z89432 Acquired absence of left foot: Secondary | ICD-10-CM | POA: Diagnosis not present

## 2020-10-20 HISTORY — PX: BONE BIOPSY: SHX375

## 2020-10-20 HISTORY — PX: I & D EXTREMITY: SHX5045

## 2020-10-20 LAB — CBC WITH DIFFERENTIAL/PLATELET
Abs Immature Granulocytes: 0.08 10*3/uL — ABNORMAL HIGH (ref 0.00–0.07)
Basophils Absolute: 0 10*3/uL (ref 0.0–0.1)
Basophils Relative: 0 %
Eosinophils Absolute: 0.1 10*3/uL (ref 0.0–0.5)
Eosinophils Relative: 1 %
HCT: 34.6 % — ABNORMAL LOW (ref 39.0–52.0)
Hemoglobin: 11.3 g/dL — ABNORMAL LOW (ref 13.0–17.0)
Immature Granulocytes: 1 %
Lymphocytes Relative: 17 %
Lymphs Abs: 1.8 10*3/uL (ref 0.7–4.0)
MCH: 28.3 pg (ref 26.0–34.0)
MCHC: 32.7 g/dL (ref 30.0–36.0)
MCV: 86.5 fL (ref 80.0–100.0)
Monocytes Absolute: 0.7 10*3/uL (ref 0.1–1.0)
Monocytes Relative: 6 %
Neutro Abs: 7.9 10*3/uL — ABNORMAL HIGH (ref 1.7–7.7)
Neutrophils Relative %: 75 %
Platelets: 299 10*3/uL (ref 150–400)
RBC: 4 MIL/uL — ABNORMAL LOW (ref 4.22–5.81)
RDW: 12.9 % (ref 11.5–15.5)
WBC: 10.6 10*3/uL — ABNORMAL HIGH (ref 4.0–10.5)
nRBC: 0 % (ref 0.0–0.2)

## 2020-10-20 LAB — GLUCOSE, CAPILLARY
Glucose-Capillary: 93 mg/dL (ref 70–99)
Glucose-Capillary: 85 mg/dL (ref 70–99)
Glucose-Capillary: 86 mg/dL (ref 70–99)
Glucose-Capillary: 92 mg/dL (ref 70–99)
Glucose-Capillary: 266 mg/dL — ABNORMAL HIGH (ref 70–99)
Glucose-Capillary: 314 mg/dL — ABNORMAL HIGH (ref 70–99)

## 2020-10-20 LAB — BASIC METABOLIC PANEL
Anion gap: 11 (ref 5–15)
BUN: 6 mg/dL (ref 6–20)
CO2: 26 mmol/L (ref 22–32)
Calcium: 8.4 mg/dL — ABNORMAL LOW (ref 8.9–10.3)
Chloride: 101 mmol/L (ref 98–111)
Creatinine, Ser: 0.67 mg/dL (ref 0.61–1.24)
GFR, Estimated: >60 mL/min (ref >60)
Glucose, Bld: 95 mg/dL (ref 70–99)
Potassium: 3.7 mmol/L (ref 3.5–5.1)
Sodium: 138 mmol/L (ref 135–145)

## 2020-10-20 LAB — RESP PANEL BY RT-PCR (FLU A&B, COVID) ARPGX2
Influenza A by PCR: NEGATIVE
Influenza B by PCR: NEGATIVE
SARS Coronavirus 2 by RT PCR: NEGATIVE

## 2020-10-20 LAB — MAGNESIUM: Magnesium: 1.8 mg/dL (ref 1.7–2.4)

## 2020-10-20 SURGERY — IRRIGATION AND DEBRIDEMENT EXTREMITY
Anesthesia: General | Site: Foot | Laterality: Left

## 2020-10-20 MED ORDER — LACTATED RINGERS IV SOLN: INTRAVENOUS | Status: DC

## 2020-10-20 MED ORDER — EPHEDRINE 5 MG/ML INJ
INTRAVENOUS | Status: AC
  Filled 2020-10-20: qty 10

## 2020-10-20 MED ORDER — SUCCINYLCHOLINE CHLORIDE 200 MG/10ML IV SOSY
PREFILLED_SYRINGE | INTRAVENOUS | Status: AC
  Filled 2020-10-20: qty 10

## 2020-10-20 MED ORDER — VANCOMYCIN HCL IN DEXTROSE 1-5 GM/200ML-% IV SOLN
1000.0000 mg | Freq: Three times a day (TID) | INTRAVENOUS | Status: DC
  Administered 2020-10-20 – 2020-10-21 (×4): 1000 mg via INTRAVENOUS
  Filled 2020-10-20 (×6): qty 200

## 2020-10-20 MED ORDER — VANCOMYCIN HCL 1000 MG IV SOLR
INTRAVENOUS | Status: DC | PRN
  Administered 2020-10-20: 1000 mg via TOPICAL

## 2020-10-20 MED ORDER — EPHEDRINE SULFATE-NACL 50-0.9 MG/10ML-% IV SOSY
PREFILLED_SYRINGE | INTRAVENOUS | Status: DC | PRN
  Administered 2020-10-20 (×2): 5 mg via INTRAVENOUS

## 2020-10-20 MED ORDER — PHENYLEPHRINE 40 MCG/ML (10ML) SYRINGE FOR IV PUSH (FOR BLOOD PRESSURE SUPPORT)
PREFILLED_SYRINGE | INTRAVENOUS | Status: DC | PRN
  Administered 2020-10-20: 40 ug via INTRAVENOUS

## 2020-10-20 MED ORDER — ONDANSETRON HCL 4 MG/2ML IJ SOLN
INTRAMUSCULAR | Status: DC | PRN
  Administered 2020-10-20: 4 mg via INTRAVENOUS

## 2020-10-20 MED ORDER — FENTANYL CITRATE (PF) 100 MCG/2ML IJ SOLN
INTRAMUSCULAR | Status: AC
  Filled 2020-10-20: qty 2

## 2020-10-20 MED ORDER — PHENYLEPHRINE 40 MCG/ML (10ML) SYRINGE FOR IV PUSH (FOR BLOOD PRESSURE SUPPORT)
PREFILLED_SYRINGE | INTRAVENOUS | Status: AC
  Filled 2020-10-20: qty 10

## 2020-10-20 MED ORDER — CHLORHEXIDINE GLUCONATE 0.12 % MT SOLN
OROMUCOSAL | Status: AC
  Administered 2020-10-20: 15 mL via OROMUCOSAL
  Filled 2020-10-20: qty 15

## 2020-10-20 MED ORDER — DEXAMETHASONE SODIUM PHOSPHATE 4 MG/ML IJ SOLN
INTRAMUSCULAR | Status: DC | PRN
  Administered 2020-10-20: 4 mg via INTRAVENOUS

## 2020-10-20 MED ORDER — FENTANYL CITRATE (PF) 100 MCG/2ML IJ SOLN
25.0000 ug | INTRAMUSCULAR | Status: DC | PRN
  Administered 2020-10-20 (×2): 50 ug via INTRAVENOUS

## 2020-10-20 MED ORDER — DEXAMETHASONE SODIUM PHOSPHATE 10 MG/ML IJ SOLN
INTRAMUSCULAR | Status: AC
  Filled 2020-10-20: qty 1

## 2020-10-20 MED ORDER — SUGAMMADEX SODIUM 200 MG/2ML IV SOLN
INTRAVENOUS | Status: DC | PRN
  Administered 2020-10-20: 200 mg via INTRAVENOUS

## 2020-10-20 MED ORDER — FENTANYL CITRATE (PF) 100 MCG/2ML IJ SOLN
INTRAMUSCULAR | Status: DC | PRN
  Administered 2020-10-20: 150 ug via INTRAVENOUS

## 2020-10-20 MED ORDER — FENTANYL CITRATE (PF) 250 MCG/5ML IJ SOLN
INTRAMUSCULAR | Status: AC
  Filled 2020-10-20: qty 5

## 2020-10-20 MED ORDER — PROPOFOL 10 MG/ML IV BOLUS
INTRAVENOUS | Status: DC | PRN
  Administered 2020-10-20: 20 mg via INTRAVENOUS

## 2020-10-20 MED ORDER — MIDAZOLAM HCL 5 MG/5ML IJ SOLN
INTRAMUSCULAR | Status: DC | PRN
  Administered 2020-10-20: 1 mg via INTRAVENOUS

## 2020-10-20 MED ORDER — PROPOFOL 10 MG/ML IV BOLUS
INTRAVENOUS | Status: AC
  Filled 2020-10-20: qty 20

## 2020-10-20 MED ORDER — LIDOCAINE HCL (PF) 2 % IJ SOLN
INTRAMUSCULAR | Status: AC
  Filled 2020-10-20: qty 5

## 2020-10-20 MED ORDER — BUPIVACAINE HCL (PF) 0.5 % IJ SOLN
INTRAMUSCULAR | Status: AC
  Filled 2020-10-20: qty 30

## 2020-10-20 MED ORDER — BUPIVACAINE HCL (PF) 0.5 % IJ SOLN
INTRAMUSCULAR | Status: DC | PRN
  Administered 2020-10-20: 10 mL

## 2020-10-20 MED ORDER — ROCURONIUM BROMIDE 10 MG/ML (PF) SYRINGE
PREFILLED_SYRINGE | INTRAVENOUS | Status: AC
  Filled 2020-10-20: qty 10

## 2020-10-20 MED ORDER — MIDAZOLAM HCL 2 MG/2ML IJ SOLN
INTRAMUSCULAR | Status: AC
  Filled 2020-10-20: qty 2

## 2020-10-20 MED ORDER — CHLORHEXIDINE GLUCONATE 0.12 % MT SOLN: 15.0000 mL | Freq: Once | OROMUCOSAL | Status: AC

## 2020-10-20 MED ORDER — VANCOMYCIN HCL 1000 MG IV SOLR
INTRAVENOUS | Status: AC
  Filled 2020-10-20: qty 1000

## 2020-10-20 MED ORDER — LIDOCAINE HCL (CARDIAC) PF 100 MG/5ML IV SOSY
PREFILLED_SYRINGE | INTRAVENOUS | Status: DC | PRN
  Administered 2020-10-20: 80 mg via INTRAVENOUS

## 2020-10-20 MED ORDER — ROCURONIUM BROMIDE 100 MG/10ML IV SOLN
INTRAVENOUS | Status: DC | PRN
  Administered 2020-10-20: 20 mg via INTRAVENOUS

## 2020-10-20 MED ORDER — SUCCINYLCHOLINE CHLORIDE 20 MG/ML IJ SOLN
INTRAMUSCULAR | Status: DC | PRN
  Administered 2020-10-20: 100 mg via INTRAVENOUS

## 2020-10-20 MED ORDER — ORAL CARE MOUTH RINSE: 15.0000 mL | Freq: Once | OROMUCOSAL | Status: AC

## 2020-10-20 MED ORDER — ALBUTEROL SULFATE HFA 108 (90 BASE) MCG/ACT IN AERS
INHALATION_SPRAY | RESPIRATORY_TRACT | Status: DC | PRN
  Administered 2020-10-20: 2 via RESPIRATORY_TRACT

## 2020-10-20 MED ORDER — ONDANSETRON HCL 4 MG/2ML IJ SOLN
INTRAMUSCULAR | Status: AC
  Filled 2020-10-20: qty 2

## 2020-10-20 SURGICAL SUPPLY — 40 items
APL PRP STRL LF DISP 70% ISPRP (MISCELLANEOUS) ×2
BNDG CMPR 9X4 STRL LF SNTH (GAUZE/BANDAGES/DRESSINGS)
BNDG ELASTIC 4X5.8 VLCR STR LF (GAUZE/BANDAGES/DRESSINGS) ×3 IMPLANT
BNDG ESMARK 4X9 LF (GAUZE/BANDAGES/DRESSINGS) IMPLANT
BNDG GAUZE ELAST 4 BULKY (GAUZE/BANDAGES/DRESSINGS) ×3 IMPLANT
CHLORAPREP W/TINT 26 (MISCELLANEOUS) ×3 IMPLANT
COVER SURGICAL LIGHT HANDLE (MISCELLANEOUS) ×3 IMPLANT
COVER WAND RF STERILE (DRAPES) ×3 IMPLANT
CUFF TOURN SGL QUICK 18X4 (TOURNIQUET CUFF) IMPLANT
CUFF TOURN SGL QUICK 34 (TOURNIQUET CUFF)
CUFF TRNQT CYL 34X4.125X (TOURNIQUET CUFF) IMPLANT
DRAPE U-SHAPE 47X51 STRL (DRAPES) ×3 IMPLANT
ELECT CAUTERY BLADE 6.4 (BLADE) ×3 IMPLANT
ELECT REM PT RETURN 9FT ADLT (ELECTROSURGICAL) ×3
ELECTRODE REM PT RTRN 9FT ADLT (ELECTROSURGICAL) ×2 IMPLANT
GAUZE PACKING IODOFORM 1X5 (PACKING) ×3 IMPLANT
GAUZE SPONGE 4X4 12PLY STRL (GAUZE/BANDAGES/DRESSINGS) ×3 IMPLANT
GLOVE BIO SURGEON STRL SZ7.5 (GLOVE) ×3 IMPLANT
GLOVE BIOGEL PI IND STRL 8 (GLOVE) ×2 IMPLANT
GLOVE BIOGEL PI INDICATOR 8 (GLOVE) ×1
GOWN STRL REUS W/ TWL LRG LVL3 (GOWN DISPOSABLE) ×2 IMPLANT
GOWN STRL REUS W/ TWL XL LVL3 (GOWN DISPOSABLE) ×2 IMPLANT
GOWN STRL REUS W/TWL LRG LVL3 (GOWN DISPOSABLE) ×3
GOWN STRL REUS W/TWL XL LVL3 (GOWN DISPOSABLE) ×3
KIT BASIN OR (CUSTOM PROCEDURE TRAY) ×3 IMPLANT
KIT TURNOVER KIT B (KITS) ×3 IMPLANT
MANIFOLD NEPTUNE II (INSTRUMENTS) ×3 IMPLANT
NEEDLE BIOPSY JAMSHIDI 8X6 (NEEDLE) IMPLANT
NEEDLE HYPO 25GX1X1/2 BEV (NEEDLE) IMPLANT
NS IRRIG 1000ML POUR BTL (IV SOLUTION) ×3 IMPLANT
PACK ORTHO EXTREMITY (CUSTOM PROCEDURE TRAY) ×3 IMPLANT
PAD ARMBOARD 7.5X6 YLW CONV (MISCELLANEOUS) ×6 IMPLANT
PROBE DEBRIDE SONICVAC MISONIX (TIP) IMPLANT
SET CYSTO W/LG BORE CLAMP LF (SET/KITS/TRAYS/PACK) ×3 IMPLANT
SOL PREP POV-IOD 4OZ 10% (MISCELLANEOUS) ×6 IMPLANT
SYR CONTROL 10ML LL (SYRINGE) IMPLANT
TOWEL GREEN STERILE (TOWEL DISPOSABLE) ×3 IMPLANT
TOWEL GREEN STERILE FF (TOWEL DISPOSABLE) ×3 IMPLANT
TUBE CONNECTING 12X1/4 (SUCTIONS) ×3 IMPLANT
YANKAUER SUCT BULB TIP NO VENT (SUCTIONS) ×3 IMPLANT

## 2020-10-20 NOTE — Plan of Care (Signed)
?  Problem: Clinical Measurements: ?Goal: Ability to avoid or minimize complications of infection will improve ?Outcome: Progressing ?  ?Problem: Skin Integrity: ?Goal: Skin integrity will improve ?Outcome: Progressing ?  ?

## 2020-10-20 NOTE — Progress Notes (Signed)
Notified X. Blount that pt CBG is 314. RN will continue to monitor.

## 2020-10-20 NOTE — Transfer of Care (Signed)
Immediate Anesthesia Transfer of Care Note  Patient: Charles Thompson  Procedure(s) Performed: IRRIGATION AND DEBRIDEMENT OF COMPLEX WOUND HEEL (Left Foot) BONE BIOPSY (Left )  Patient Location: PACU  Anesthesia Type:General  Level of Consciousness: awake, alert , oriented and patient cooperative  Airway & Oxygen Therapy: Patient Spontanous Breathing  Post-op Assessment: Report given to RN and Post -op Vital signs reviewed and stable  Post vital signs: Reviewed and stable  Last Vitals:  Vitals Value Taken Time  BP 126/69 10/20/20 1024  Temp    Pulse 93 10/20/20 1025  Resp 20 10/20/20 1025  SpO2 97 % 10/20/20 1025  Vitals shown include unvalidated device data.  Last Pain:  Vitals:   10/20/20 0232  TempSrc: Oral  PainSc:       Patients Stated Pain Goal: 3 (10/19/20 2000)  Complications: No complications documented.

## 2020-10-20 NOTE — Anesthesia Procedure Notes (Signed)
Procedure Name: Intubation Date/Time: 10/20/2020 9:43 AM Performed by: Sonda Primes, CRNA Pre-anesthesia Checklist: Patient identified, Emergency Drugs available, Suction available and Patient being monitored Patient Re-evaluated:Patient Re-evaluated prior to induction Oxygen Delivery Method: Circle System Utilized Preoxygenation: Pre-oxygenation with 100% oxygen Induction Type: IV induction Ventilation: Mask ventilation without difficulty Laryngoscope Size: Glidescope and 4 Grade View: Grade I Tube type: Oral Tube size: 8.0 mm Number of attempts: 1 Airway Equipment and Method: Stylet and Oral airway Placement Confirmation: ETT inserted through vocal cords under direct vision,  positive ETCO2 and breath sounds checked- equal and bilateral Secured at: 22 cm Tube secured with: Tape Dental Injury: Teeth and Oropharynx as per pre-operative assessment

## 2020-10-20 NOTE — Anesthesia Preprocedure Evaluation (Addendum)
Anesthesia Evaluation  Patient identified by MRN, date of birth, ID band Patient awake    Reviewed: Allergy & Precautions, NPO status , Patient's Chart, lab work & pertinent test results  Airway Mallampati: II  TM Distance: >3 FB     Dental   Pulmonary Current Smoker and Patient abstained from smoking.,    breath sounds clear to auscultation       Cardiovascular hypertension, + dysrhythmias  Rhythm:Regular Rate:Normal     Neuro/Psych Anxiety Depression  Neuromuscular disease    GI/Hepatic Neg liver ROS, GERD  ,  Endo/Other  diabetes  Renal/GU Renal disease     Musculoskeletal  (+) Arthritis ,   Abdominal   Peds  Hematology   Anesthesia Other Findings   Reproductive/Obstetrics                             Anesthesia Physical Anesthesia Plan  ASA: III  Anesthesia Plan: General   Post-op Pain Management:    Induction: Intravenous  PONV Risk Score and Plan: Ondansetron, Dexamethasone and Propofol infusion  Airway Management Planned: Oral ETT  Additional Equipment:   Intra-op Plan:   Post-operative Plan: Extubation in OR  Informed Consent: I have reviewed the patients History and Physical, chart, labs and discussed the procedure including the risks, benefits and alternatives for the proposed anesthesia with the patient or authorized representative who has indicated his/her understanding and acceptance.     Dental advisory given  Plan Discussed with: CRNA and Anesthesiologist  Anesthesia Plan Comments:        Anesthesia Quick Evaluation

## 2020-10-20 NOTE — Op Note (Addendum)
Patient Name: Charles Thompson DOB: July 19, 1961  MRN: [id number]   Date of Surgery: 10/20/20  Surgeon: Dr. Ventura Sellers, DPM Assistants: none  Pre-operative Diagnosis:  Cellulitis, abscess, possible osteomyelitis Post-operative Diagnosis:  Cellulitis, abscess, osteomyelitis Procedures:  1) Incision and drainage of complex foot and heel abscess  2) Bone biopsy left calcaneus Pathology/Specimens: ID Type Source Tests Collected by Time Destination  1 : Left Calcaneous Bone Tissue PATH Bone biopsy SURGICAL PATHOLOGY Park Liter, DPM 10/20/2020 1000   A : Left Calcaneous Bone Tissue PATH Bone biopsy AEROBIC/ANAEROBIC CULTURE (SURGICAL/DEEP WOUND) Park Liter, DPM 10/20/2020 1001   B : Left Calcaneous Wound Superficial  Wound Wound AEROBIC/ANAEROBIC CULTURE (SURGICAL/DEEP WOUND) Park Liter, DPM 10/20/2020 1004   C : Left Calcaneous Wound Deep Wound Wound AEROBIC/ANAEROBIC CULTURE (SURGICAL/DEEP WOUND) Park Liter, DPM 10/20/2020 1006    Anesthesia: general Hemostasis: * No tourniquets in log * Estimated Blood Loss: 10 mL Materials: * No implants in log * Medications: 1g Vancomycin powder Complications: none  Indications for Procedure:  This is a 59 y.o. male who presented approximate 2 weeks ago with an severe infection of his left foot.  He underwent incision and drainage, repeat debridement and closure, and was discharged against medical advice per his request.  He presented to the office on Friday with worsening wound to the left heel and with pain and he was sent to the hospital for admission and for surgery.  MRI was taken and showed no identifiable fluid collections however likely osteomyelitis of the heel.  Today's procedure was more extensive than the procedure he had previously.  Proocedure in Detail: Patient was identified in pre-operative holding area. Formal consent was signed and the left lower extremity was marked. Patient was brought back to the operating  room. Anesthesia was induced. The extremity was prepped and draped in the usual sterile fashion. Timeout was taken to confirm patient name, laterality, and procedure prior to incision.   Attention was then directed to the left heel and midfoot.  There was intact incision of the distal aspect the foot related to previous incision and drainage.  There was a medial and lateral fibronecrotic abscesses with surrounding cellulitis of the heel.  Superficial swab culture was taken.  A 15 blade was used to incise around each wound and the areas were spread with hemostat.  There was minimal purulence at this area.  The medial wound probed to bone.  The wounds were sharply excisionally debrided with a rongeur.  A bone biopsy was then taken with a Jamshidi needle of the medial calcaneus.  This was split for both microbiology and pathology.  Neither of the heel wounds communicated with the medial wound at the area of previous incision and drainage.   Attention was directed to the midfoot wound.  Several of the most proximal staples were removed with purulent discharge.  A deep swab culture was taken.   The wounds were then thoroughly irrigated with approximate 1.5 L of normal saline via pulse lavage.  The wounds were sharply excisionally debrided again.  The wounds were then further irrigated with an additional 1.5 L for a total of 3 L of irrigation.  1 g of vancomycin powder was packed into the wound.  The midfoot wound was then packed with 1 inch needle from packing.  The heel wounds were packed with 1 continue strand of 1 inch iodoform packing.  The foot was then dressed with betadine, 4x4, kerlix, ACE.Marland Kitchen Patient tolerated the procedure well.  Disposition: Following a period of post-operative monitoring, patient will be transferred to the floor.  We will presume that he has calcaneal osteomyelitis.  He will need at least 6 weeks of IV antibiotics via PICC line.  Recommend ID consult once cultures return.  Will  continue to follow.

## 2020-10-20 NOTE — Progress Notes (Signed)
Subjective:  Patient ID: Charles Thompson, male    DOB: 1961/09/27,  MRN: [id number]  Seen in pre-op. Denies complaints. Ready for surgery. Objective:   Vitals:   10/19/20 1913 10/20/20 0232  BP: 129/72 (!) 122/55  Pulse: 62 78  Resp: 18 16  Temp: 98.9 F (37.2 C) 98.5 F (36.9 C)  SpO2: 99% 97%   Results for orders placed or performed during the hospital encounter of 10/18/20 (from the past 24 hour(s))  Glucose, capillary     Status: Abnormal   Collection Time: 10/19/20 11:40 AM  Result Value Ref Range   Glucose-Capillary 63 (L) 70 - 99 mg/dL  Glucose, capillary     Status: None   Collection Time: 10/19/20 12:24 PM  Result Value Ref Range   Glucose-Capillary 79 70 - 99 mg/dL  Glucose, capillary     Status: Abnormal   Collection Time: 10/19/20  4:27 PM  Result Value Ref Range   Glucose-Capillary 103 (H) 70 - 99 mg/dL  Glucose, capillary     Status: Abnormal   Collection Time: 10/19/20  9:07 PM  Result Value Ref Range   Glucose-Capillary 142 (H) 70 - 99 mg/dL  CBC with Differential/Platelet     Status: Abnormal   Collection Time: 10/20/20  2:44 AM  Result Value Ref Range   WBC 10.6 (H) 4.0 - 10.5 K/uL   RBC 4.00 (L) 4.22 - 5.81 MIL/uL   Hemoglobin 11.3 (L) 13.0 - 17.0 g/dL   HCT 10.6 (L) 39 - 52 %   MCV 86.5 80.0 - 100.0 fL   MCH 28.3 26.0 - 34.0 pg   MCHC 32.7 30.0 - 36.0 g/dL   RDW 26.9 48.5 - 46.2 %   Platelets 299 150 - 400 K/uL   nRBC 0.0 0.0 - 0.2 %   Neutrophils Relative % 75 %   Neutro Abs 7.9 (H) 1.7 - 7.7 K/uL   Lymphocytes Relative 17 %   Lymphs Abs 1.8 0.7 - 4.0 K/uL   Monocytes Relative 6 %   Monocytes Absolute 0.7 0.1 - 1.0 K/uL   Eosinophils Relative 1 %   Eosinophils Absolute 0.1 0.0 - 0.5 K/uL   Basophils Relative 0 %   Basophils Absolute 0.0 0.0 - 0.1 K/uL   Immature Granulocytes 1 %   Abs Immature Granulocytes 0.08 (H) 0.00 - 0.07 K/uL  Basic metabolic panel     Status: Abnormal   Collection Time: 10/20/20  2:44 AM  Result Value Ref Range    Sodium 138 135 - 145 mmol/L   Potassium 3.7 3.5 - 5.1 mmol/L   Chloride 101 98 - 111 mmol/L   CO2 26 22 - 32 mmol/L   Glucose, Bld 95 70 - 99 mg/dL   BUN 6 6 - 20 mg/dL   Creatinine, Ser 7.03 0.61 - 1.24 mg/dL   Calcium 8.4 (L) 8.9 - 10.3 mg/dL   GFR, Estimated >50 >09 mL/min   Anion gap 11 5 - 15  Magnesium     Status: None   Collection Time: 10/20/20  2:44 AM  Result Value Ref Range   Magnesium 1.8 1.7 - 2.4 mg/dL  Glucose, capillary     Status: None   Collection Time: 10/20/20  5:53 AM  Result Value Ref Range   Glucose-Capillary 93 70 - 99 mg/dL  Resp Panel by RT-PCR (Flu A&B, Covid)     Status: None   Collection Time: 10/20/20  6:51 AM  Result Value Ref Range   SARS Coronavirus 2 by  RT PCR NEGATIVE NEGATIVE   Influenza A by PCR NEGATIVE NEGATIVE   Influenza B by PCR NEGATIVE NEGATIVE   Results for orders placed or performed during the hospital encounter of 10/18/20  Culture, blood (routine x 2)     Status: None (Preliminary result)   Collection Time: 10/18/20  7:53 PM   Specimen: BLOOD LEFT ARM  Result Value Ref Range Status   Specimen Description BLOOD LEFT ARM  Final   Special Requests   Final    BOTTLES DRAWN AEROBIC AND ANAEROBIC Blood Culture results may not be optimal due to an excessive volume of blood received in culture bottles   Culture   Final    NO GROWTH < 12 HOURS Performed at [facility], [address] [place], [place] [address]    Report Status PENDING  Incomplete  Culture, blood (routine x 2)     Status: None (Preliminary result)   Collection Time: 10/18/20  7:53 PM   Specimen: BLOOD RIGHT ARM  Result Value Ref Range Status   Specimen Description BLOOD RIGHT ARM  Final   Special Requests   Final    BOTTLES DRAWN AEROBIC AND ANAEROBIC Blood Culture results may not be optimal due to an excessive volume of blood received in culture bottles   Culture   Final    NO GROWTH < 12 HOURS Performed at [facility], [address],  [place], [place] [address]    Report Status PENDING  Incomplete  Resp Panel by RT-PCR (Flu A&B, Covid)     Status: None   Collection Time: 10/20/20  6:51 AM  Result Value Ref Range Status   SARS Coronavirus 2 by RT PCR NEGATIVE NEGATIVE Final    Comment: (NOTE) SARS-CoV-2 target nucleic acids are NOT DETECTED.  The SARS-CoV-2 RNA is generally detectable in upper respiratory specimens during the acute phase of infection. The lowest concentration of SARS-CoV-2 viral copies this assay can detect is 138 copies/mL. A negative result does not preclude SARS-Cov-2 infection and should not be used as the sole basis for treatment or other patient management decisions. A negative result may occur with  improper specimen collection/handling, submission of specimen other than nasopharyngeal swab, presence of viral mutation(s) within the areas targeted by this assay, and inadequate number of viral copies(<138 copies/mL). A negative result must be combined with clinical observations, patient history, and epidemiological information. The expected result is Negative.  Fact Sheet for Patients:  BloggerCourse.com  Fact Sheet for Healthcare Providers:  SeriousBroker.it  This test is no t yet approved or cleared by the [place] FDA and  has been authorized for detection and/or diagnosis of SARS-CoV-2 by FDA under an Emergency Use Authorization (EUA). This EUA will remain  in effect (meaning this test can be used) for the duration of the COVID-19 declaration under Section 564(b)(1) of the Act, 21 U.S.C.section 360bbb-3(b)(1), unless the authorization is terminated  or revoked sooner.       Influenza A by PCR NEGATIVE NEGATIVE Final   Influenza B by PCR NEGATIVE NEGATIVE Final    Comment: (NOTE) The Xpert Xpress SARS-CoV-2/FLU/RSV plus assay is intended as an aid in the diagnosis of influenza from Nasopharyngeal swab specimens and should not be  used as a sole basis for treatment. Nasal washings and aspirates are unacceptable for Xpert Xpress SARS-CoV-2/FLU/RSV testing.  Fact Sheet for Patients: BloggerCourse.com  Fact Sheet for Healthcare Providers: SeriousBroker.it  This test is not yet approved or cleared by the [place]  FDA and has been authorized for detection and/or diagnosis of SARS-CoV-2 by FDA under an Emergency Use Authorization (EUA). This EUA will remain in effect (meaning this test can be used) for the duration of the COVID-19 declaration under Section 564(b)(1) of the Act, 21 U.S.C. section 360bbb-3(b)(1), unless the authorization is terminated or revoked.  Performed at [facility], [address], [place], [place] [address]     Dressing left intact. C/D/I without strikethrough.  Assessment & Plan:  Patient was evaluated and treated and all questions answered.  Left foot wound/abscess with concern for OM -OR today for debridement both left foot wounds, possible calcaneus bone bx. -Continue empiric IV abx. -WBAT LLE in Boot -Will continue to follow  Park Liter, DPM  Accessible via secure chat for questions or concerns.

## 2020-10-20 NOTE — Progress Notes (Signed)
PROGRESS NOTE    Charles Thompson  EGB:[id number] DOB: 04/13/1961 DOA: 10/18/2020 PCP: Gaspar Garbe, MD    Brief Narrative:  Charles Thompson was admitted to the hospital with the working diagnosis of left diabetic foot infection, posterior calcaneus osteomyelitis.  59 year old male with past medical history for type 2 diabetes mellitus, diabetic foot status post left transmetatarsal amputation, and depression.  He was directly admitted from the outpatient podiatry clinic for a worsening left foot wound.    Patient had a recent hospitalization for left foot cellulitis, 10/10/2020-10/14/2020, when he received IV antibiotic therapy and underwent incision and drainage.  He was discharged on oral levofloxacin.  On 10/16/2020 he was seen at the outpatient podiatry clinic with worsening erythema, and edema at his left foot, instructed to continue oral antibiotics.  2 days later on 10/18/2020 he noticed purulent material coming from the lateral aspect of his left heel, he was evaluated at the podiatry clinic and was referred for direct admission. On his physical examination his blood pressure was 106/63, heart rate 72, respiratory rate 18, temperature 98.9, oxygen saturation 99%, his lungs are clear to auscultation bilaterally, heart S1-S2, present rhythmic, soft abdomen, no lower extremity edema.  He had multiple amputation on his right foot, transmetatarsal amputation left foot, large ulcerated lesion at his heel.  Sodium 133, potassium 3.2, chloride 95, bicarb 27, glucose 99, BUN 11, creatinine 1.0, white count 16.2, hemoglobin 11.0, Hct 32.8, platelets 337. White count 16.2, hemoglobin 11.0, hematocrit 32.8, platelets 337.  Foot MRI with findings suggesting posterior calcaneous osteomyelitis.   12/04 patient underwent I&D of left complex foot and heel abscess with left calcaneus bone biopsy.   Assessment & Plan:   Principal Problem:   Diabetic foot infection (HCC) Active Problems:    Diabetic ulcer of left foot associated with type 2 diabetes mellitus (HCC)   History of transmetatarsal amputation of left foot (HCC)   Major depressive disorder, recurrent episode, in remission with seasonal pattern (HCC)   Type 2 diabetes mellitus with diabetic neuropathy, with long-term current use of insulin (HCC)   Cellulitis   Acute osteomyelitis of calcaneum, left (HCC)    1. Left diabetic foot infection, posterior calcaneous osteomyelitis.   Cultures from prior hospitalization from left foot abscess/ blood positive for streptococcus intermedius, sensitive to pnc, cephalosporins and levofloxacin.  Wbc continue trending down to 10,6 from 14,6. Follow on surgical cultures from today, calcaneal bone biopsy.   On Unasyn and IV vancomycin.     2. T2DM with hypoglycemia/ dyslipidemia. Controlled glucose with fasting 95 mg/ dl this am. Continue with insulin sliding scale for glucose cover and monitoring.   On atorvastatin.   3. HTN. systolic blood pressure 121 and 134 mmHg, continue amlodipine for blood pressure control.  4. Hypokalemia. Renal function with serum cr at 0.67, K is 3,7 and bicarbonate 26. Patient tolerating po well, no nausea or vomiting.   5. Depression/ tobacco abuse. On sertraline and clonazepam.  Smoking cessation, nicotine patch.    Status is: Inpatient  Remains inpatient appropriate because:IV treatments appropriate due to intensity of illness or inability to take PO   Dispo: The patient is from: Home              Anticipated d/c is to: Home              Anticipated d/c date is: 2 days              Patient currently is not medically stable to d/c.  DVT prophylaxis: Enoxaparin   Code Status:   full  Family Communication:  No family at the bedside     Consultants:   Podiatry   Procedures:  Incision and drainage of complex foot and heel abscess  Bone biopsy left calcaneus  Antimicrobials:   unasyn  Vancomycin      Subjective: Patient is feeling well, pain is well controlled at the surgical site, no nausea or vomiting, no dyspnea.   Objective: Vitals:   10/20/20 1040 10/20/20 1055 10/20/20 1107 10/20/20 1223  BP: 124/68 121/67 134/72   Pulse: 87 85 83   Resp: 19 18 17 17   Temp:  98.2 F (36.8 C) 98.5 F (36.9 C)   TempSrc:   Oral   SpO2: 93% 96% 98% 98%  Weight:      Height:        Intake/Output Summary (Last 24 hours) at 10/20/2020 1330 Last data filed at 10/20/2020 1149 Gross per 24 hour  Intake 841.88 ml  Output 1310 ml  Net -468.12 ml   Filed Weights   10/18/20 1700  Weight: 97.3 kg    Examination:   General: Not in pain or dyspnea, deconditioned Neurology: Awake and alert, non focal  E ENT: no pallor, no icterus, oral mucosa moist Cardiovascular: No JVD. S1-S2 present, rhythmic, no gallops, rubs, or murmurs. No lower extremity edema. Pulmonary: positive breath sounds bilaterally, adequate air movement, no wheezing, rhonchi or rales. Gastrointestinal. Abdomen soft and non tender Skin. No rashes Musculoskeletal: left foot with dressing in place.      Data Reviewed: I have personally reviewed following labs and imaging studies  CBC: Recent Labs  Lab 10/14/20 0301 10/18/20 1953 10/19/20 0209 10/20/20 0244  WBC 12.8* 16.2* 14.6* 10.6*  NEUTROABS  --   --   --  7.9*  HGB 10.4* 11.0* 10.6* 11.3*  HCT 31.2* 32.8* 30.3* 34.6*  MCV 86.4 85.4 84.6 86.5  PLT 315 337 286 299   Basic Metabolic Panel: Recent Labs  Lab 10/14/20 0301 10/18/20 1953 10/19/20 0209 10/20/20 0244  NA 135 133* 140 138  K 4.0 3.2* 2.9* 3.7  CL 101 95* 98 101  CO2 21* 27 28 26   GLUCOSE 174* 99 85 95  BUN 12 11 10 6   CREATININE 0.96 1.06 0.95 0.67  CALCIUM 8.1* 8.2* 8.4* 8.4*  MG 2.1  --   --  1.8  PHOS 2.0*  --   --   --    GFR: Estimated Creatinine Clearance: 118.8 mL/min (by C-G formula based on SCr of 0.67 mg/dL). Liver Function Tests: Recent Labs  Lab 10/14/20 0301   ALBUMIN 2.2*   No results for input(s): LIPASE, AMYLASE in the last 168 hours. No results for input(s): AMMONIA in the last 168 hours. Coagulation Profile: No results for input(s): INR, PROTIME in the last 168 hours. Cardiac Enzymes: No results for input(s): CKTOTAL, CKMB, CKMBINDEX, TROPONINI in the last 168 hours. BNP (last 3 results) No results for input(s): PROBNP in the last 8760 hours. HbA1C: No results for input(s): HGBA1C in the last 72 hours. CBG: Recent Labs  Lab 10/19/20 2107 10/20/20 0553 10/20/20 0914 10/20/20 1026 10/20/20 1125  GLUCAP 142* 93 85 86 92   Lipid Profile: No results for input(s): CHOL, HDL, LDLCALC, TRIG, CHOLHDL, LDLDIRECT in the last 72 hours. Thyroid Function Tests: No results for input(s): TSH, T4TOTAL, FREET4, T3FREE, THYROIDAB in the last 72 hours. Anemia Panel: No results for input(s): VITAMINB12, FOLATE, FERRITIN, TIBC, IRON, RETICCTPCT in the last  72 hours.    Radiology Studies: I have reviewed all of the imaging during this hospital visit personally     Scheduled Meds: . aspirin  81 mg Oral Daily  . atorvastatin  10 mg Oral Daily  . clonazePAM  1 mg Oral QHS  . fentaNYL      . insulin aspart  0-15 Units Subcutaneous TID WC  . nicotine  21 mg Transdermal QPM  . sertraline  150 mg Oral Daily  . sodium chloride flush  3 mL Intravenous Q12H   Continuous Infusions: . ampicillin-sulbactam (UNASYN) IV 3 g (10/20/20 1234)  . vancomycin       LOS: 2 days        Gleen Ripberger Annett Gula, MD

## 2020-10-21 ENCOUNTER — Encounter (HOSPITAL_COMMUNITY): Payer: Self-pay | Admitting: Podiatry

## 2020-10-21 ENCOUNTER — Inpatient Hospital Stay: Payer: Self-pay

## 2020-10-21 DIAGNOSIS — M86172 Other acute osteomyelitis, left ankle and foot: Secondary | ICD-10-CM | POA: Diagnosis not present

## 2020-10-21 DIAGNOSIS — E11621 Type 2 diabetes mellitus with foot ulcer: Secondary | ICD-10-CM | POA: Diagnosis not present

## 2020-10-21 DIAGNOSIS — L03116 Cellulitis of left lower limb: Secondary | ICD-10-CM | POA: Diagnosis not present

## 2020-10-21 DIAGNOSIS — E11628 Type 2 diabetes mellitus with other skin complications: Secondary | ICD-10-CM | POA: Diagnosis not present

## 2020-10-21 LAB — CBC WITH DIFFERENTIAL/PLATELET
Abs Immature Granulocytes: 0.05 10*3/uL (ref 0.00–0.07)
Basophils Absolute: 0.1 10*3/uL (ref 0.0–0.1)
Basophils Relative: 1 %
Eosinophils Absolute: 0.1 10*3/uL (ref 0.0–0.5)
Eosinophils Relative: 1 %
HCT: 32.2 % — ABNORMAL LOW (ref 39.0–52.0)
Hemoglobin: 11.2 g/dL — ABNORMAL LOW (ref 13.0–17.0)
Immature Granulocytes: 1 %
Lymphocytes Relative: 23 %
Lymphs Abs: 2.1 10*3/uL (ref 0.7–4.0)
MCH: 29.7 pg (ref 26.0–34.0)
MCHC: 34.8 g/dL (ref 30.0–36.0)
MCV: 85.4 fL (ref 80.0–100.0)
Monocytes Absolute: 0.5 10*3/uL (ref 0.1–1.0)
Monocytes Relative: 5 %
Neutro Abs: 6.5 10*3/uL (ref 1.7–7.7)
Neutrophils Relative %: 69 %
Platelets: 293 10*3/uL (ref 150–400)
RBC: 3.77 MIL/uL — ABNORMAL LOW (ref 4.22–5.81)
RDW: 13.1 % (ref 11.5–15.5)
WBC: 9.3 10*3/uL (ref 4.0–10.5)
nRBC: 0 % (ref 0.0–0.2)

## 2020-10-21 LAB — BASIC METABOLIC PANEL
Anion gap: 12 (ref 5–15)
BUN: 7 mg/dL (ref 6–20)
CO2: 25 mmol/L (ref 22–32)
Calcium: 8.6 mg/dL — ABNORMAL LOW (ref 8.9–10.3)
Chloride: 101 mmol/L (ref 98–111)
Creatinine, Ser: 0.79 mg/dL (ref 0.61–1.24)
GFR, Estimated: >60 mL/min (ref >60)
Glucose, Bld: 139 mg/dL — ABNORMAL HIGH (ref 70–99)
Potassium: 3.5 mmol/L (ref 3.5–5.1)
Sodium: 138 mmol/L (ref 135–145)

## 2020-10-21 LAB — GLUCOSE, CAPILLARY
Glucose-Capillary: 146 mg/dL — ABNORMAL HIGH (ref 70–99)
Glucose-Capillary: 142 mg/dL — ABNORMAL HIGH (ref 70–99)
Glucose-Capillary: 135 mg/dL — ABNORMAL HIGH (ref 70–99)
Glucose-Capillary: 134 mg/dL — ABNORMAL HIGH (ref 70–99)

## 2020-10-21 MED ORDER — AMOXICILLIN-POT CLAVULANATE 875-125 MG PO TABS
1.0000 | ORAL_TABLET | Freq: Two times a day (BID) | ORAL | Status: DC
  Administered 2020-10-21 – 2020-10-22 (×2): 1 via ORAL
  Filled 2020-10-21 (×2): qty 1

## 2020-10-21 MED ORDER — DOXYCYCLINE HYCLATE 100 MG PO TABS
100.0000 mg | ORAL_TABLET | Freq: Two times a day (BID) | ORAL | Status: DC
  Administered 2020-10-21 – 2020-10-22 (×2): 100 mg via ORAL
  Filled 2020-10-21 (×2): qty 1

## 2020-10-21 MED ORDER — DOXYCYCLINE HYCLATE 100 MG PO TABS: 100.0000 mg | ORAL_TABLET | Freq: Two times a day (BID) | ORAL | 0 refills | Status: DC

## 2020-10-21 MED ORDER — AMOXICILLIN-POT CLAVULANATE 875-125 MG PO TABS: 1.0000 | ORAL_TABLET | Freq: Two times a day (BID) | ORAL | 0 refills | Status: DC

## 2020-10-21 MED ORDER — ENOXAPARIN SODIUM 40 MG/0.4ML ~~LOC~~ SOLN
40.0000 mg | SUBCUTANEOUS | Status: DC
  Administered 2020-10-21 – 2020-10-22 (×2): 40 mg via SUBCUTANEOUS
  Filled 2020-10-21 (×2): qty 0.4

## 2020-10-21 NOTE — Progress Notes (Signed)
Subjective: Postop day #1 status post incision and drainage abscess heel, bone biopsy left calcaneus.  States he is feeling well he is ready to be discharged.  He was been to leave AGAINST MEDICAL ADVICE today.  Plan to see the patient after discussion he is planning to hopefully get a PICC line tomorrow before discharge and he agrees to stay the evening. Denies any systemic complaints such as fevers, chills, nausea, vomiting.    Objective: AAO x3, NAD Large incisional plantar aspect of foot as well as the TMA site with staples intact and healing well.  Large incisions to the posterior calcaneus with a through and through incision with packing to the medial lateral calcaneus as well is an incision on the medial heel with packing intact.  Upon removal serosanguineous drainage expressed there is no purulence.  Minimal residual cellulitis but overall appears to be much improved.  There is no significant warmth.  There is no fluctuation crepitation.  Wound to the heel of fibronecrotic tissue.  No discomfort to palpation at surgical sites. No pain with calf compression, swelling, warmth, erythema  Assessment: Postop day #1 status post left calcaneal I&D, bone biopsy  Plan: Discussion the patient will stay the evening.  Continue IV antibiotics for now.  Awaiting PICC line hopefully tomorrow morning removal discharge tomorrow the proper antibiotics.  The dressing was changed today.  I flushed the wound with saline and repacked the wound with saline soaked 4 x 4 gauze.  Patient has pain around the incision site by a dry sterile dressing.  Encourage elevation.  Offloading.  If discharged tomorrow will follow up with Dr. Samuella Cota later this week in the office.  Ovid Curd, DPM

## 2020-10-21 NOTE — Plan of Care (Signed)

## 2020-10-21 NOTE — Anesthesia Postprocedure Evaluation (Signed)
Anesthesia Post Note  Patient: Charles Thompson  Procedure(s) Performed: IRRIGATION AND DEBRIDEMENT OF COMPLEX WOUND HEEL (Left Foot) BONE BIOPSY (Left )     Patient location during evaluation: PACU Anesthesia Type: General Level of consciousness: awake and alert Pain management: pain level controlled Vital Signs Assessment: post-procedure vital signs reviewed and stable Respiratory status: spontaneous breathing, nonlabored ventilation, respiratory function stable and patient connected to nasal cannula oxygen Cardiovascular status: blood pressure returned to baseline and stable Postop Assessment: no apparent nausea or vomiting Anesthetic complications: no   No complications documented.  Last Vitals:  Vitals:   10/21/20 0000 10/21/20 0432  BP: 117/67 98/64  Pulse: (!) 59 66  Resp: 16 18  Temp: (!) 36.4 C 36.6 C  SpO2: 98% 100%    Last Pain:  Vitals:   10/21/20 0432  TempSrc: Oral  PainSc:                  Delissa Silba S

## 2020-10-21 NOTE — Progress Notes (Signed)
Patient has decided to leave AGAINST MEDICAL ADVICE. He does not want to be in the hospital anymore, I asked him if there is anything that can be done to change his mind, to make him more comfortable but he is adamant about leaving the hospital.  I have explained to him the need to continue IV antibiotics for osteomyelitis.  He understands the risk of leaving the hospital without appropriate treatment.  Including worsening infection, risk of losing his extremity and death.  I will send prescriptions for oral antibiotics, Augmentin and doxycycline.  Follow-up with podiatrist outpatient.

## 2020-10-21 NOTE — Progress Notes (Signed)
PROGRESS NOTE    Charles Thompson  SAY:[id number] DOB: [birth year] DOA: 10/18/2020 PCP: Gaspar Garbe, MD    Brief Narrative:  Mr. Duve was admitted to the hospital with the working diagnosis of left diabetic foot infection, posterior calcaneus osteomyelitis.  59 year old male with past medical history for type 2 diabetes mellitus, diabetic foot status post left transmetatarsal amputation, and depression. He was directly admitted from the outpatient podiatry clinic for a worsening left foot wound.   Patient had a recent hospitalization for left foot cellulitis, 10/10/2020-10/14/2020, when he received IV antibiotic therapy and underwent incision and drainage. He was dischargedon oral levofloxacin. On 10/16/2020 he was seen at the outpatient podiatry clinic with worsening erythema, and edema at his left foot, instructed to continue oral antibiotics.2 days later on12/01/2020 he noticed purulent material coming from the lateral aspect of hisleftheel, he was evaluated at the podiatry clinic and was referred for direct admission. On his physical examination his blood pressure was 106/63, heart rate 72, respiratory rate 18, temperature 98.9, oxygen saturation 99%, his lungs are clear to auscultation bilaterally, heart S1-S2, present rhythmic, soft abdomen, no lower extremity edema.He had multiple amputation on his right foot, transmetatarsal amputation left foot, large ulcerated lesion at his heel.  Sodium 133, potassium 3.2, chloride 95, bicarb 27, glucose 99, BUN 11, creatinine 1.0, white count 16.2, hemoglobin 11.0,Hct32.8, platelets 337. White count 16.2, hemoglobin 11.0, hematocrit 32.8, platelets 337.  Foot MRI with findings suggesting posterior calcaneous osteomyelitis.  12/04 patient underwent I&D of left complex foot and heel abscess with left calcaneus bone biopsy.   Gram stain with no organisms seen, cultures still in progress. Patient is willing to go home.     Assessment & Plan:   Principal Problem:   Diabetic foot infection (HCC) Active Problems:   Diabetic ulcer of left foot associated with type 2 diabetes mellitus (HCC)   History of transmetatarsal amputation of left foot (HCC)   Major depressive disorder, recurrent episode, in remission with seasonal pattern (HCC)   Type 2 diabetes mellitus with diabetic neuropathy, with long-term current use of insulin (HCC)   Cellulitis   Acute osteomyelitis of calcaneum, left (HCC)    1. Left diabetic foot infection, posterior calcaneous osteomyelitis.   Cultures from prior hospitalization from left foot abscess/ blood positive for streptococcus intermedius, sensitive to pnc, cephalosporins and levofloxacin.  Continue improving wbc, down to 9,3, he continue to be afebrile, OR samples with no organisms per gram stain, and cultures still in progress. Per podiatry recommendations, patient will need 6 weeks of IV antibiotic therapy for suspected calcaneous osteomyelitis.   Continue antibiotic therapy with  unasyn and IV vancomycin. Order PICC line for home antibiotic therapy. Plan to consult ID for outpatient follow up and final recommendations on antibiotic therapy.     2. T2DM with hypoglycemia/ dyslipidemia.  Fasting glucose is 139 and capillary 142. Will liberate his diet, in order to encourage better po intake.  On insulin sliding scale for glucose cover and monitoring.   Continue with atorvastatin.   3. HTN. continue with amlodipine for blood pressure control.   4. Hypokalemia. stable renal function and electrolytes with serum cr at 0,79 and K at 3,5 with bicarbonate at 25.  5. Depression/ tobacco abuse. Continue with sertraline and clonazepam.  Continue smoking cessation, nicotine patch.   Status is: Inpatient  Remains inpatient appropriate because:IV treatments appropriate due to intensity of illness or inability to take PO   Dispo: The patient is from: Home  Anticipated d/c is to: Home              Anticipated d/c date is: 2 days              Patient currently is not medically stable to d/c.  DVT prophylaxis: Enoxaparin   Code Status:   full  Family Communication:  No family at the bedside      Consultants:   Orthopedics  Procedures: Incision and drainage of complex foot and heel abscess  Bone biopsy left calcaneus  Antimicrobials:  unasyn  Vancomycin  Subjective: Patient is anxious of going home, complains of not able to eat hospital food, no nausea or vomiting, no significant wound pain.   Objective: Vitals:   10/20/20 2020 10/21/20 0000 10/21/20 0432 10/21/20 0830  BP: 135/79 117/67 98/64 134/76  Pulse: 88 (!) 59 66 60  Resp: 18 16 18 18   Temp: 98.3 F (36.8 C) (!) 97.5 F (36.4 C) 97.9 F (36.6 C) 98 F (36.7 C)  TempSrc: Oral Oral Oral Oral  SpO2: 98% 98% 100% 96%  Weight:      Height:        Intake/Output Summary (Last 24 hours) at 10/21/2020 1402 Last data filed at 10/20/2020 2300 Gross per 24 hour  Intake 632.2 ml  Output 450 ml  Net 182.2 ml   Filed Weights   10/18/20 1700  Weight: 97.3 kg    Examination:   General: Not in pain or dyspnea, Neurology: Awake and alert, non focal  E ENT: no pallor, no icterus, oral mucosa moist Cardiovascular: No JVD. S1-S2 present, rhythmic, no gallops, rubs, or murmurs. No lower extremity edema. Pulmonary: positive reath sounds bilaterally, adequate air movement, no wheezing, rhonchi or rales. Gastrointestinal. Abdomen soft and non tender Skin. No rashes Musculoskeletal: left foot with dressing in place.     Data Reviewed: I have personally reviewed following labs and imaging studies  CBC: Recent Labs  Lab 10/18/20 1953 10/19/20 0209 10/20/20 0244 10/21/20 0805  WBC 16.2* 14.6* 10.6* 9.3  NEUTROABS  --   --  7.9* 6.5  HGB 11.0* 10.6* 11.3* 11.2*  HCT 32.8* 30.3* 34.6* 32.2*  MCV 85.4 84.6 86.5 85.4  PLT 337 286 299 293   Basic Metabolic  Panel: Recent Labs  Lab 10/18/20 1953 10/19/20 0209 10/20/20 0244 10/21/20 0805  NA 133* 140 138 138  K 3.2* 2.9* 3.7 3.5  CL 95* 98 101 101  CO2 27 28 26 25   GLUCOSE 99 85 95 139*  BUN 11 10 6 7   CREATININE 1.06 0.95 0.67 0.79  CALCIUM 8.2* 8.4* 8.4* 8.6*  MG  --   --  1.8  --    GFR: Estimated Creatinine Clearance: 118.8 mL/min (by C-G formula based on SCr of 0.79 mg/dL). Liver Function Tests: No results for input(s): AST, ALT, ALKPHOS, BILITOT, PROT, ALBUMIN in the last 168 hours. No results for input(s): LIPASE, AMYLASE in the last 168 hours. No results for input(s): AMMONIA in the last 168 hours. Coagulation Profile: No results for input(s): INR, PROTIME in the last 168 hours. Cardiac Enzymes: No results for input(s): CKTOTAL, CKMB, CKMBINDEX, TROPONINI in the last 168 hours. BNP (last 3 results) No results for input(s): PROBNP in the last 8760 hours. HbA1C: No results for input(s): HGBA1C in the last 72 hours. CBG: Recent Labs  Lab 10/20/20 1125 10/20/20 1614 10/20/20 2105 10/21/20 0712 10/21/20 1155  GLUCAP 92 266* 314* 146* 142*   Lipid Profile: No results for input(s): CHOL,  HDL, LDLCALC, TRIG, CHOLHDL, LDLDIRECT in the last 72 hours. Thyroid Function Tests: No results for input(s): TSH, T4TOTAL, FREET4, T3FREE, THYROIDAB in the last 72 hours. Anemia Panel: No results for input(s): VITAMINB12, FOLATE, FERRITIN, TIBC, IRON, RETICCTPCT in the last 72 hours.    Radiology Studies: I have reviewed all of the imaging during this hospital visit personally     Scheduled Meds: . aspirin  81 mg Oral Daily  . atorvastatin  10 mg Oral Daily  . clonazePAM  1 mg Oral QHS  . insulin aspart  0-15 Units Subcutaneous TID WC  . nicotine  21 mg Transdermal QPM  . sertraline  150 mg Oral Daily  . sodium chloride flush  3 mL Intravenous Q12H   Continuous Infusions: . ampicillin-sulbactam (UNASYN) IV 3 g (10/21/20 1216)  . vancomycin 1,000 mg (10/21/20 1316)      LOS: 3 days        Hashem Goynes Annett Gula, MD

## 2020-10-22 ENCOUNTER — Inpatient Hospital Stay: Payer: Self-pay

## 2020-10-22 ENCOUNTER — Other Ambulatory Visit (HOSPITAL_COMMUNITY): Payer: Self-pay | Admitting: Internal Medicine

## 2020-10-22 ENCOUNTER — Telehealth: Payer: Self-pay

## 2020-10-22 DIAGNOSIS — M86172 Other acute osteomyelitis, left ankle and foot: Secondary | ICD-10-CM | POA: Diagnosis not present

## 2020-10-22 DIAGNOSIS — E1140 Type 2 diabetes mellitus with diabetic neuropathy, unspecified: Secondary | ICD-10-CM | POA: Diagnosis not present

## 2020-10-22 DIAGNOSIS — E11621 Type 2 diabetes mellitus with foot ulcer: Secondary | ICD-10-CM | POA: Diagnosis not present

## 2020-10-22 DIAGNOSIS — L03116 Cellulitis of left lower limb: Secondary | ICD-10-CM | POA: Diagnosis not present

## 2020-10-22 DIAGNOSIS — E11628 Type 2 diabetes mellitus with other skin complications: Secondary | ICD-10-CM | POA: Diagnosis not present

## 2020-10-22 DIAGNOSIS — B954 Other streptococcus as the cause of diseases classified elsewhere: Secondary | ICD-10-CM

## 2020-10-22 LAB — CBC WITH DIFFERENTIAL/PLATELET
Abs Immature Granulocytes: 0.05 10*3/uL (ref 0.00–0.07)
Basophils Absolute: 0.1 10*3/uL (ref 0.0–0.1)
Basophils Relative: 1 %
Eosinophils Absolute: 0.1 10*3/uL (ref 0.0–0.5)
Eosinophils Relative: 2 %
HCT: 33.1 % — ABNORMAL LOW (ref 39.0–52.0)
Hemoglobin: 11.3 g/dL — ABNORMAL LOW (ref 13.0–17.0)
Immature Granulocytes: 1 %
Lymphocytes Relative: 23 %
Lymphs Abs: 1.8 10*3/uL (ref 0.7–4.0)
MCH: 29.1 pg (ref 26.0–34.0)
MCHC: 34.1 g/dL (ref 30.0–36.0)
MCV: 85.3 fL (ref 80.0–100.0)
Monocytes Absolute: 0.5 10*3/uL (ref 0.1–1.0)
Monocytes Relative: 6 %
Neutro Abs: 5.5 10*3/uL (ref 1.7–7.7)
Neutrophils Relative %: 67 %
Platelets: 321 10*3/uL (ref 150–400)
RBC: 3.88 MIL/uL — ABNORMAL LOW (ref 4.22–5.81)
RDW: 13 % (ref 11.5–15.5)
WBC: 8 10*3/uL (ref 4.0–10.5)
nRBC: 0 % (ref 0.0–0.2)

## 2020-10-22 LAB — GLUCOSE, CAPILLARY
Glucose-Capillary: 124 mg/dL — ABNORMAL HIGH (ref 70–99)
Glucose-Capillary: 148 mg/dL — ABNORMAL HIGH (ref 70–99)

## 2020-10-22 LAB — SURGICAL PATHOLOGY

## 2020-10-22 LAB — VANCOMYCIN, TROUGH: Vancomycin Tr: 10 ug/mL — ABNORMAL LOW (ref 15–20)

## 2020-10-22 MED ORDER — AMOXICILLIN-POT CLAVULANATE 875-125 MG PO TABS: 1.0000 | ORAL_TABLET | Freq: Two times a day (BID) | ORAL | 0 refills | Status: DC

## 2020-10-22 MED ORDER — VANCOMYCIN HCL IN DEXTROSE 1-5 GM/200ML-% IV SOLN
1000.0000 mg | Freq: Three times a day (TID) | INTRAVENOUS | Status: DC
  Filled 2020-10-22 (×2): qty 200

## 2020-10-22 MED ORDER — ORITAVANCIN DIPHOSPHATE 400 MG IV SOLR
1200.0000 mg | Freq: Once | INTRAVENOUS | Status: AC
  Administered 2020-10-22: 1200 mg via INTRAVENOUS
  Filled 2020-10-22: qty 120

## 2020-10-22 MED ORDER — AMOXICILLIN-POT CLAVULANATE 875-125 MG PO TABS
1.0000 | ORAL_TABLET | Freq: Two times a day (BID) | ORAL | Status: DC
  Administered 2020-10-22: 1 via ORAL
  Filled 2020-10-22: qty 1

## 2020-10-22 MED ORDER — SODIUM CHLORIDE 0.9 % IV SOLN
2.0000 g | INTRAVENOUS | Status: DC
  Administered 2020-10-22: 2 g via INTRAVENOUS
  Filled 2020-10-22: qty 20

## 2020-10-22 MED FILL — AMOX-CLAV 875-125 MG TABLET: 875-125 | 42 days supply | Qty: 84 | Fill #0

## 2020-10-22 NOTE — Discharge Summary (Addendum)
Physician Discharge Summary  Charles Thompson ZOX:[id number] DOB: August 28, 1961 DOA: 10/18/2020  PCP: Gaspar Garbe, MD  Admit date: 10/18/2020 Discharge date: 10/22/2020  Admitted From: Home Disposition:  Home    Recommendations for Outpatient Follow-up and new medication changes:  1. Follow up with Dr. Wylene Simmer in 7 days.  2. Follow up with Dr. Daiva Eves in January 3. Continue antibiotic therapy with Augmentin for 6 weeks.  4. Patient had one dose of oritavancin before discharge 5. Follow up with podiatry, Dr. Samuella Cota this week.   Home Health: no  Equipment/Devices: na   Discharge Condition: stable  CODE STATUS: full  Diet recommendation: heart healthy and diabetic prudent   Brief/Interim Summary: [name] was admitted to the hospital with the working diagnosis of left diabetic foot infection, posterior calcaneus osteomyelitis.  59 year old male with past medical history for type 2 diabetes mellitus, diabetic foot status post left transmetatarsal amputation, and depression. He was directly admitted from the outpatient podiatry clinic for a worsening left foot wound.   Patient had a recent hospitalization for left foot cellulitis, 10/10/2020-10/14/2020, when he received IV antibiotic therapy and underwent incision and drainage. He was dischargedon oral levofloxacin. On 10/16/2020 he was seen at the outpatient podiatry clinic with worsening erythema, and edema at his left foot, instructed to continue oral antibiotics.2 days later on12/01/2020 he noticed purulent material coming from the lateral aspect of hisleftheel, he was evaluated at the podiatry clinic and was referred for direct admission. On his physical examination his blood pressure was 106/63, heart rate 72, respiratory rate 18, temperature 98.9, oxygen saturation 99%, his lungs are clear to auscultation bilaterally, heart S1-S2, present rhythmic, soft abdomen, no lower extremity edema.He had multiple amputation on his  right foot, transmetatarsal amputation left foot, large ulcerated lesion at his heel.  Sodium 133, potassium 3.2, chloride 95, bicarb 27, glucose 99, BUN 11, creatinine 1.0, white count 16.2, hemoglobin 11.0,Hct32.8, platelets 337. White count 16.2, hemoglobin 11.0, hematocrit 32.8, platelets 337.  Foot MRI with findings suggesting posterior calcaneous osteomyelitis.  12/04 patient underwent I&D of left complex foot and heel abscess with left calcaneus bone biopsy.  Gram stain with no organisms seen, biopsy report with no osteomyelitis. Patient evaluated by ID, he received Oritavancin infusion and continue with oral antibiotic therapy with Augmentin for 6 weeks.   1. Left diabetic foot infection, posterior calcaneous osteomyelitis. Complicated with severe sepsis (present on admission)( serum cr 1,29 on 10/11/20) Cultures from prior hospitalization from left foot abscess/ blood positive for streptococcus intermedius, sensitive to pnc, cephalosporins and levofloxacin.  Wbc down to 8,0. Cultures continue with no growth, his dressings were changed by podiatry yesterday.    Patient is feeling well, no significant left foot pain, tolerating po well, will follow with podiatry and ID as outpatient.   2. T2DM with hypoglycemia/ dyslipidemia. Liberated diet to regular, glucose continue to be stable with capillary 124 mg/dl, continue close monitoring and coverage with sliding scale.   Onatorvastatin.  At discharge resume Toujeo.   3. HTN. Blood pressure control with amlodipine, resume hctz and lisinopril at discharge.    4. Hypokalemia/ AKI.clinically resolved, patient tolerating po well.   5. Depression/ tobacco abuse.Onsertraline and clonazepam. Smoking cessation, nicotine patch.    Discharge Diagnoses:  Principal Problem:   Diabetic foot infection (HCC) Active Problems:   Diabetic ulcer of left foot associated with type 2 diabetes mellitus (HCC)   History of  transmetatarsal amputation of left foot (HCC)   Major depressive disorder, recurrent episode, in  remission with seasonal pattern (HCC)   Type 2 diabetes mellitus with diabetic neuropathy, with long-term current use of insulin (HCC)   Cellulitis   Acute osteomyelitis of calcaneum, left (HCC)    Discharge Instructions   Allergies as of 10/22/2020   No Known Allergies     Medication List    STOP taking these medications   levofloxacin 750 MG tablet Commonly known as: Levaquin   oxyCODONE-acetaminophen 10-325 MG tablet Commonly known as: PERCOCET     TAKE these medications   amLODipine 10 MG tablet Commonly known as: NORVASC Take 10 mg by mouth daily.   amoxicillin-clavulanate 875-125 MG tablet Commonly known as: AUGMENTIN Take 1 tablet by mouth every 12 (twelve) hours.   aspirin 81 MG tablet Take 81 mg by mouth daily.   atorvastatin 10 MG tablet Commonly known as: LIPITOR Take 10 mg by mouth daily.   clonazePAM 1 MG tablet Commonly known as: KlonoPIN Take 1 tablet (1 mg total) by mouth at bedtime.   ibuprofen 200 MG tablet Commonly known as: ADVIL Take 400 mg by mouth every 6 (six) hours as needed for headache (pain).   lisinopril-hydrochlorothiazide 20-12.5 MG tablet Commonly known as: ZESTORETIC Take 1 tablet by mouth daily.   nicotine 21 mg/24hr patch Commonly known as: NICODERM CQ - dosed in mg/24 hours Place 1 patch (21 mg total) onto the skin daily.   omeprazole 20 MG capsule Commonly known as: PRILOSEC Take 20 mg by mouth daily as needed (heartburn).   OneTouch Delica Plus Lancet33G Misc USE TO TEST BLOOD SUGAR THREE TIMES DAILY   OneTouch Verio test strip Generic drug: glucose blood USE TO TEST BLOOD SUGAR THREE TIMES DAILY   sertraline 100 MG tablet Commonly known as: ZOLOFT TAKE 1 AND 1/2 TABLETS(150 MG) BY MOUTH DAILY What changed: See the new instructions.   Synjardy 12.03-999 MG Tabs Generic drug: Empagliflozin-metFORMIN HCl Take 1  tablet by mouth daily.   Toujeo SoloStar 300 UNIT/ML Solostar Pen Generic drug: insulin glargine (1 Unit Dial) Inject 50 Units into the skin See admin instructions. Inject 50 units subcutaneously twice daily - noon and bedtime            Discharge Care Instructions  (From admission, onward)         Start     Ordered   10/22/20 0000  Discharge wound care:       Comments: Pending   10/22/20 1525          No Known Allergies  Consultations:  Podiatry   ID    Procedures/Studies: MR ANKLE LEFT WO CONTRAST  Result Date: 10/18/2020 CLINICAL DATA:  Suspected osteomyelitis EXAM: MRI OF THE LEFT ANKLE WITHOUT CONTRAST TECHNIQUE: Multiplanar, multisequence MR imaging of the ankle was performed. No intravenous contrast was administered. COMPARISON:  None. FINDINGS: The study is limited due to patient motion. Bones/Joint/Cartilage The patient is status post transmetatarsal amputation. There appears to be increased T2 hyperintense signal seen in the posterior calcaneus with subtle T1 hypointensity. No large ankle joint effusion. Ligaments The Lisfranc ligaments appear to be intact. Muscles and Tendons Increased signal with fatty atrophy seen within the muscles. The visualized portions of the tendons appear to be grossly intact. There is increased signal and thickening seen within the insertion site of the Achilles tendon. Soft tissue Diffuse subcutaneous edema seen on the plantar surface with overlying surgical table along the medial aspect of the foot. There is increased signal within the middle cord of the plantar fascia with a  probable focal disruption at the midfoot which may be postsurgical. IMPRESSION: Findings which could be suggestive of early osteomyelitis along the posterior calcaneus. No loculated fluid collections. Diffuse heel pad edema/cellulitis. Insertional Achilles tendinosis Electronically Signed   By: Jonna Clark M.D.   On: 10/18/2020 23:44   DG Foot Complete  Left  Result Date: 10/10/2020 CLINICAL DATA:  Left foot pain since Friday. Diabetes. Redness and warmth on the bottom and side of the foot. EXAM: LEFT FOOT - COMPLETE 3+ VIEW COMPARISON:  10/04/2020 FINDINGS: Previous amputations of the first ray at the tarsometatarsal level and of the second through fifth rays at the mid metatarsal level. Productive bone and callus formation demonstrated at the resection stumps. Bone cortex appears intact. No evidence of cortical erosion or bone destruction. No abnormal sclerosis. No focal bone lesion or bone destruction. Degenerative changes in the intertarsal joints. There is evidence of focal soft tissue gas in the superficial subcutaneous tissues over the resection stump at the level of the second metatarsal. This may represent focal cellulitis. Vascular calcifications are seen. IMPRESSION: Postoperative amputations. Focal soft tissue gas in the superficial subcutaneous tissues over the resection stump at the level of the second metatarsal, possibly indicating soft tissue infection. No evidence of osteomyelitis. Electronically Signed   By: Burman Nieves M.D.   On: 10/10/2020 02:49   DG Foot Complete Left  Result Date: 10/09/2020 Please see detailed radiograph report in office note.  DG MINI C-ARM IMAGE ONLY  Result Date: 10/13/2020 There is no interpretation for this exam.  This order is for images obtained during a surgical procedure.  Please See "Surgeries" Tab for more information regarding the procedure.   [place] EKG SITE RITE  Result Date: 10/22/2020 If Site Rite image not attached, placement could not be confirmed due to current cardiac rhythm.  [place] EKG SITE RITE  Result Date: 10/21/2020 If Site Rite image not attached, placement could not be confirmed due to current cardiac rhythm.     Procedures:  I&D of left complex foot and heel abscess with left calcaneus bone biopsy.   Subjective: Patient feeling well, no nausea or vomiting, no  significant foot pain.   Discharge Exam: Vitals:   10/22/20 0500 10/22/20 0943  BP: 131/88 (!) 142/60  Pulse: 78 66  Resp: 17 18  Temp: 98.2 F (36.8 C) 98 F (36.7 C)  SpO2: 100% 95%   Vitals:   10/21/20 1601 10/21/20 2012 10/22/20 0500 10/22/20 0943  BP: (!) 120/99 (!) 129/91 131/88 (!) 142/60  Pulse: 65 (!) 102 78 66  Resp: Temp: 98.4 F (36.9 C) 98.4 F (36.9 C) 98.2 F (36.8 C) 98 F (36.7 C)  TempSrc: Oral Oral Oral   SpO2: 99% 99% 100% 95%  Weight:      Height:        General: Not in pain or dyspnea , Neurology: Awake and alert, non focal  E ENT: no pallor, no icterus, oral mucosa moist Cardiovascular: No JVD. S1-S2 present, rhythmic, no gallops, rubs, or murmurs. No lower extremity edema. Pulmonary: vesicular breath sounds bilaterally, adequate air movement, no wheezing, rhonchi or rales. Gastrointestinal. Abdomen soft and non tender Skin. No rashes Musculoskeletal: left foot with dressing in place.    The results of significant diagnostics from this hospitalization (including imaging, microbiology, ancillary and laboratory) are listed below for reference.     Microbiology: Recent Results (from the past 240 hour(s))  Aerobic/Anaerobic Culture (surgical/deep wound)     Status:  None   Collection Time: 10/13/20  8:13 AM   Specimen: PATH Other; Tissue  Result Value Ref Range Status   Specimen Description ABSCESS LEFT FOOT  Final   Special Requests SPEC A LEFT FOOT ABSCESS  Final   Gram Stain NO WBC SEEN NO ORGANISMS SEEN   Final   Culture   Final    RARE STREPTOCOCCUS INTERMEDIUS NO ANAEROBES ISOLATED Performed at [facility], [address], [place], [place] [address]    Report Status 10/18/2020 FINAL  Final   Organism ID, Bacteria STREPTOCOCCUS INTERMEDIUS  Final      Susceptibility   Streptococcus intermedius - MIC*    PENICILLIN <=0.06 SENSITIVE Sensitive     CEFTRIAXONE 0.25 SENSITIVE Sensitive     ERYTHROMYCIN <=0.12  SENSITIVE Sensitive     LEVOFLOXACIN 0.5 SENSITIVE Sensitive     VANCOMYCIN 0.5 SENSITIVE Sensitive     * RARE STREPTOCOCCUS INTERMEDIUS  Culture, blood (routine x 2)     Status: None   Collection Time: 10/14/20  2:50 PM   Specimen: BLOOD RIGHT ARM  Result Value Ref Range Status   Specimen Description BLOOD RIGHT ARM  Final   Special Requests   Final    BOTTLES DRAWN AEROBIC AND ANAEROBIC Blood Culture adequate volume   Culture   Final    NO GROWTH 5 DAYS Performed at [facility], [address], [place], [place] [address]    Report Status 10/19/2020 FINAL  Final  Culture, blood (routine x 2)     Status: None   Collection Time: 10/14/20  2:50 PM   Specimen: BLOOD LEFT HAND  Result Value Ref Range Status   Specimen Description BLOOD LEFT HAND  Final   Special Requests   Final    BOTTLES DRAWN AEROBIC AND ANAEROBIC Blood Culture adequate volume   Culture   Final    NO GROWTH 5 DAYS Performed at [facility], [address], [place], [place] [address]    Report Status 10/19/2020 FINAL  Final  Culture, blood (routine x 2)     Status: None (Preliminary result)   Collection Time: 10/18/20  7:53 PM   Specimen: BLOOD LEFT ARM  Result Value Ref Range Status   Specimen Description BLOOD LEFT ARM  Final   Special Requests   Final    BOTTLES DRAWN AEROBIC AND ANAEROBIC Blood Culture results may not be optimal due to an excessive volume of blood received in culture bottles   Culture   Final    NO GROWTH 4 DAYS Performed at [facility], [address] [place], [place] [address]    Report Status PENDING  Incomplete  Culture, blood (routine x 2)     Status: None (Preliminary result)   Collection Time: 10/18/20  7:53 PM   Specimen: BLOOD RIGHT ARM  Result Value Ref Range Status   Specimen Description BLOOD RIGHT ARM  Final   Special Requests   Final    BOTTLES DRAWN AEROBIC AND ANAEROBIC Blood Culture results may not be optimal due to an excessive volume of blood  received in culture bottles   Culture   Final    NO GROWTH 4 DAYS Performed at [facility], [address], [place], [place] [address]    Report Status PENDING  Incomplete  Resp Panel by RT-PCR (Flu A&B, Covid)     Status: None   Collection Time: 10/20/20  6:51 AM  Result Value Ref Range Status  SARS Coronavirus 2 by RT PCR NEGATIVE NEGATIVE Final    Comment: (NOTE) SARS-CoV-2 target nucleic acids are NOT DETECTED.  The SARS-CoV-2 RNA is generally detectable in upper respiratory specimens during the acute phase of infection. The lowest concentration of SARS-CoV-2 viral copies this assay can detect is 138 copies/mL. A negative result does not preclude SARS-Cov-2 infection and should not be used as the sole basis for treatment or other patient management decisions. A negative result may occur with  improper specimen collection/handling, submission of specimen other than nasopharyngeal swab, presence of viral mutation(s) within the areas targeted by this assay, and inadequate number of viral copies(<138 copies/mL). A negative result must be combined with clinical observations, patient history, and epidemiological information. The expected result is Negative.  Fact Sheet for Patients:  BloggerCourse.com  Fact Sheet for Healthcare Providers:  SeriousBroker.it  This test is no t yet approved or cleared by the [place] FDA and  has been authorized for detection and/or diagnosis of SARS-CoV-2 by FDA under an Emergency Use Authorization (EUA). This EUA will remain  in effect (meaning this test can be used) for the duration of the COVID-19 declaration under Section 564(b)(1) of the Act, 21 U.S.C.section 360bbb-3(b)(1), unless the authorization is terminated  or revoked sooner.       Influenza A by PCR NEGATIVE NEGATIVE Final   Influenza B by PCR NEGATIVE NEGATIVE Final    Comment: (NOTE) The Xpert Xpress  SARS-CoV-2/FLU/RSV plus assay is intended as an aid in the diagnosis of influenza from Nasopharyngeal swab specimens and should not be used as a sole basis for treatment. Nasal washings and aspirates are unacceptable for Xpert Xpress SARS-CoV-2/FLU/RSV testing.  Fact Sheet for Patients: BloggerCourse.com  Fact Sheet for Healthcare Providers: SeriousBroker.it  This test is not yet approved or cleared by the [place] FDA and has been authorized for detection and/or diagnosis of SARS-CoV-2 by FDA under an Emergency Use Authorization (EUA). This EUA will remain in effect (meaning this test can be used) for the duration of the COVID-19 declaration under Section 564(b)(1) of the Act, 21 U.S.C. section 360bbb-3(b)(1), unless the authorization is terminated or revoked.  Performed at [facility], [address], [place], [place] [address]   Aerobic/Anaerobic Culture (surgical/deep wound)     Status: None (Preliminary result)   Collection Time: 10/20/20 10:01 AM   Specimen: PATH Bone biopsy; Tissue  Result Value Ref Range Status   Specimen Description BONE  Final   Special Requests LEFT CALCANEOUS BONE SPEC A  Final   Gram Stain   Final    RARE WBC PRESENT, PREDOMINANTLY MONONUCLEAR NO ORGANISMS SEEN    Culture   Final    NO GROWTH 2 DAYS NO ANAEROBES ISOLATED; CULTURE IN PROGRESS FOR 5 DAYS Performed at [facility], [address], [place], [place] [address]    Report Status PENDING  Incomplete  Aerobic/Anaerobic Culture (surgical/deep wound)     Status: None (Preliminary result)   Collection Time: 10/20/20 10:04 AM   Specimen: Wound  Result Value Ref Range Status   Specimen Description WOUND  Final   Special Requests LEFT CALCANEOUS WD SUPERFICIAL SPEC B  Final   Gram Stain   Final    RARE WBC PRESENT, PREDOMINANTLY PMN NO ORGANISMS SEEN    Culture   Final    NO GROWTH 2 DAYS NO ANAEROBES ISOLATED; CULTURE IN  PROGRESS FOR 5 DAYS Performed at [facility], [address] [place], [place] [address]  Report Status PENDING  Incomplete  Aerobic/Anaerobic Culture (surgical/deep wound)     Status: None (Preliminary result)   Collection Time: 10/20/20 10:06 AM   Specimen: Wound  Result Value Ref Range Status   Specimen Description WOUND  Final   Special Requests LEFT CALCANEOUS WD DEEP SPEC C  Final   Gram Stain   Final    RARE WBC PRESENT,BOTH PMN AND MONONUCLEAR NO ORGANISMS SEEN    Culture   Final    NO GROWTH 2 DAYS NO ANAEROBES ISOLATED; CULTURE IN PROGRESS FOR 5 DAYS Performed at [facility], [address], [place], [place] [address]    Report Status PENDING  Incomplete     Labs: BNP (last 3 results) No results for input(s): BNP in the last 8760 hours. Basic Metabolic Panel: Recent Labs  Lab 10/18/20 1953 10/19/20 0209 10/20/20 0244 10/21/20 0805  NA 133* 140 138 138  K 3.2* 2.9* 3.7 3.5  CL 95* 98 101 101  CO2 27 28 26 25   GLUCOSE 99 85 95 139*  BUN 11 10 6 7   CREATININE 1.06 0.95 0.67 0.79  CALCIUM 8.2* 8.4* 8.4* 8.6*  MG  --   --  1.8  --    Liver Function Tests: No results for input(s): AST, ALT, ALKPHOS, BILITOT, PROT, ALBUMIN in the last 168 hours. No results for input(s): LIPASE, AMYLASE in the last 168 hours. No results for input(s): AMMONIA in the last 168 hours. CBC: Recent Labs  Lab 10/18/20 1953 10/19/20 0209 10/20/20 0244 10/21/20 0805 10/22/20 0316  WBC 16.2* 14.6* 10.6* 9.3 8.0  NEUTROABS  --   --  7.9* 6.5 5.5  HGB 11.0* 10.6* 11.3* 11.2* 11.3*  HCT 32.8* 30.3* 34.6* 32.2* 33.1*  MCV 85.4 84.6 86.5 85.4 85.3  PLT 337 286 299 293 321   Cardiac Enzymes: No results for input(s): CKTOTAL, CKMB, CKMBINDEX, TROPONINI in the last 168 hours. BNP: Invalid input(s): POCBNP CBG: Recent Labs  Lab 10/21/20 1155 10/21/20 1803 10/21/20 2228 10/22/20 0657 10/22/20 1128  GLUCAP 142* 135* 134* 124* 148*   D-Dimer No results for  input(s): DDIMER in the last 72 hours. Hgb A1c No results for input(s): HGBA1C in the last 72 hours. Lipid Profile No results for input(s): CHOL, HDL, LDLCALC, TRIG, CHOLHDL, LDLDIRECT in the last 72 hours. Thyroid function studies No results for input(s): TSH, T4TOTAL, T3FREE, THYROIDAB in the last 72 hours.  Invalid input(s): FREET3 Anemia work up No results for input(s): VITAMINB12, FOLATE, FERRITIN, TIBC, IRON, RETICCTPCT in the last 72 hours. Urinalysis    Component Value Date/Time   COLORURINE YELLOW 12/18/2007 1926   APPEARANCEUR CLEAR 12/18/2007 1926   LABSPEC 1.040 (H) 12/18/2007 1926   PHURINE 6.5 12/18/2007 1926   GLUCOSEU >1000 (A) 12/18/2007 1926   HGBUR NEGATIVE 12/18/2007 1926   HGBUR NEGATIVE POINT OF CARE RESULT 12/18/2007 1926   BILIRUBINUR NEGATIVE 12/18/2007 1926   KETONESUR 40 (A) 12/18/2007 1926   PROTEINUR NEGATIVE 12/18/2007 1926   UROBILINOGEN 1.0 12/18/2007 1926   NITRITE NEGATIVE 12/18/2007 1926   LEUKOCYTESUR NEGATIVE 12/18/2007 1926   Sepsis Labs Invalid input(s): PROCALCITONIN,  WBC,  LACTICIDVEN Microbiology Recent Results (from the past 240 hour(s))  Aerobic/Anaerobic Culture (surgical/deep wound)     Status: None   Collection Time: 10/13/20  8:13 AM   Specimen: PATH Other; Tissue  Result Value Ref Range Status   Specimen Description ABSCESS LEFT FOOT  Final   Special Requests SPEC A LEFT FOOT ABSCESS  Final  Gram Stain NO WBC SEEN NO ORGANISMS SEEN   Final   Culture   Final    RARE STREPTOCOCCUS INTERMEDIUS NO ANAEROBES ISOLATED Performed at [facility], [address] [place], [place] [address]    Report Status 10/18/2020 FINAL  Final   Organism ID, Bacteria STREPTOCOCCUS INTERMEDIUS  Final      Susceptibility   Streptococcus intermedius - MIC*    PENICILLIN <=0.06 SENSITIVE Sensitive     CEFTRIAXONE 0.25 SENSITIVE Sensitive     ERYTHROMYCIN <=0.12 SENSITIVE Sensitive     LEVOFLOXACIN 0.5 SENSITIVE Sensitive      VANCOMYCIN 0.5 SENSITIVE Sensitive     * RARE STREPTOCOCCUS INTERMEDIUS  Culture, blood (routine x 2)     Status: None   Collection Time: 10/14/20  2:50 PM   Specimen: BLOOD RIGHT ARM  Result Value Ref Range Status   Specimen Description BLOOD RIGHT ARM  Final   Special Requests   Final    BOTTLES DRAWN AEROBIC AND ANAEROBIC Blood Culture adequate volume   Culture   Final    NO GROWTH 5 DAYS Performed at [facility], [address] [place], [place] [address]    Report Status 10/19/2020 FINAL  Final  Culture, blood (routine x 2)     Status: None   Collection Time: 10/14/20  2:50 PM   Specimen: BLOOD LEFT HAND  Result Value Ref Range Status   Specimen Description BLOOD LEFT HAND  Final   Special Requests   Final    BOTTLES DRAWN AEROBIC AND ANAEROBIC Blood Culture adequate volume   Culture   Final    NO GROWTH 5 DAYS Performed at [facility], [address] [place], [place] [address]    Report Status 10/19/2020 FINAL  Final  Culture, blood (routine x 2)     Status: None (Preliminary result)   Collection Time: 10/18/20  7:53 PM   Specimen: BLOOD LEFT ARM  Result Value Ref Range Status   Specimen Description BLOOD LEFT ARM  Final   Special Requests   Final    BOTTLES DRAWN AEROBIC AND ANAEROBIC Blood Culture results may not be optimal due to an excessive volume of blood received in culture bottles   Culture   Final    NO GROWTH 4 DAYS Performed at [facility], [address] [place], [place] [address]    Report Status PENDING  Incomplete  Culture, blood (routine x 2)     Status: None (Preliminary result)   Collection Time: 10/18/20  7:53 PM   Specimen: BLOOD RIGHT ARM  Result Value Ref Range Status   Specimen Description BLOOD RIGHT ARM  Final   Special Requests   Final    BOTTLES DRAWN AEROBIC AND ANAEROBIC Blood Culture results may not be optimal due to an excessive volume of blood received in culture bottles   Culture   Final    NO GROWTH 4  DAYS Performed at [facility], [address] [place], [place] [address]    Report Status PENDING  Incomplete  Resp Panel by RT-PCR (Flu A&B, Covid)     Status: None   Collection Time: 10/20/20  6:51 AM  Result Value Ref Range Status   SARS Coronavirus 2 by RT PCR NEGATIVE NEGATIVE Final    Comment: (NOTE) SARS-CoV-2 target nucleic acids are NOT DETECTED.  The SARS-CoV-2 RNA is generally detectable in upper respiratory specimens during the acute phase of infection. The lowest concentration of  SARS-CoV-2 viral copies this assay can detect is 138 copies/mL. A negative result does not preclude SARS-Cov-2 infection and should not be used as the sole basis for treatment or other patient management decisions. A negative result may occur with  improper specimen collection/handling, submission of specimen other than nasopharyngeal swab, presence of viral mutation(s) within the areas targeted by this assay, and inadequate number of viral copies(<138 copies/mL). A negative result must be combined with clinical observations, patient history, and epidemiological information. The expected result is Negative.  Fact Sheet for Patients:  BloggerCourse.com  Fact Sheet for Healthcare Providers:  SeriousBroker.it  This test is no t yet approved or cleared by the [place] FDA and  has been authorized for detection and/or diagnosis of SARS-CoV-2 by FDA under an Emergency Use Authorization (EUA). This EUA will remain  in effect (meaning this test can be used) for the duration of the COVID-19 declaration under Section 564(b)(1) of the Act, 21 U.S.C.section 360bbb-3(b)(1), unless the authorization is terminated  or revoked sooner.       Influenza A by PCR NEGATIVE NEGATIVE Final   Influenza B by PCR NEGATIVE NEGATIVE Final    Comment: (NOTE) The Xpert Xpress SARS-CoV-2/FLU/RSV plus assay is intended as an aid in the diagnosis of  influenza from Nasopharyngeal swab specimens and should not be used as a sole basis for treatment. Nasal washings and aspirates are unacceptable for Xpert Xpress SARS-CoV-2/FLU/RSV testing.  Fact Sheet for Patients: BloggerCourse.com  Fact Sheet for Healthcare Providers: SeriousBroker.it  This test is not yet approved or cleared by the [place] FDA and has been authorized for detection and/or diagnosis of SARS-CoV-2 by FDA under an Emergency Use Authorization (EUA). This EUA will remain in effect (meaning this test can be used) for the duration of the COVID-19 declaration under Section 564(b)(1) of the Act, 21 U.S.C. section 360bbb-3(b)(1), unless the authorization is terminated or revoked.  Performed at [facility], [address], [place], [place] [address]   Aerobic/Anaerobic Culture (surgical/deep wound)     Status: None (Preliminary result)   Collection Time: 10/20/20 10:01 AM   Specimen: PATH Bone biopsy; Tissue  Result Value Ref Range Status   Specimen Description BONE  Final   Special Requests LEFT CALCANEOUS BONE SPEC A  Final   Gram Stain   Final    RARE WBC PRESENT, PREDOMINANTLY MONONUCLEAR NO ORGANISMS SEEN    Culture   Final    NO GROWTH 2 DAYS NO ANAEROBES ISOLATED; CULTURE IN PROGRESS FOR 5 DAYS Performed at [facility], [address], [place], [place] [address]    Report Status PENDING  Incomplete  Aerobic/Anaerobic Culture (surgical/deep wound)     Status: None (Preliminary result)   Collection Time: 10/20/20 10:04 AM   Specimen: Wound  Result Value Ref Range Status   Specimen Description WOUND  Final   Special Requests LEFT CALCANEOUS WD SUPERFICIAL SPEC B  Final   Gram Stain   Final    RARE WBC PRESENT, PREDOMINANTLY PMN NO ORGANISMS SEEN    Culture   Final    NO GROWTH 2 DAYS NO ANAEROBES ISOLATED; CULTURE IN PROGRESS FOR 5 DAYS Performed at [facility], [address], [place], [place] [address]    Report Status PENDING  Incomplete  Aerobic/Anaerobic Culture (surgical/deep wound)     Status: None (Preliminary result)   Collection Time: 10/20/20 10:06 AM   Specimen: Wound  Result Value Ref Range Status   Specimen Description  WOUND  Final   Special Requests LEFT CALCANEOUS WD DEEP SPEC C  Final   Gram Stain   Final    RARE WBC PRESENT,BOTH PMN AND MONONUCLEAR NO ORGANISMS SEEN    Culture   Final    NO GROWTH 2 DAYS NO ANAEROBES ISOLATED; CULTURE IN PROGRESS FOR 5 DAYS Performed at [facility], [address] [place], [place] [address]    Report Status PENDING  Incomplete     Time coordinating discharge: 45 minutes  SIGNED:   Coralie Keens, MD  [facility] 10/22/2020, 2:46 PM

## 2020-10-22 NOTE — Progress Notes (Addendum)
PROGRESS NOTE    Charles Thompson  OZH:[id number] DOB: 02-01-1961 DOA: 10/18/2020 PCP: Gaspar Garbe, MD    Brief Narrative:  [name] was admitted to the hospital with the working diagnosis of left diabetic foot infection, posterior calcaneus osteomyelitis.  59 year old male with past medical history for type 2 diabetes mellitus, diabetic foot status post left transmetatarsal amputation, and depression. He was directly admitted from the outpatient podiatry clinic for a worsening left foot wound.   Patient had a recent hospitalization for left foot cellulitis, 10/10/2020-10/14/2020 (left ama), when he received IV antibiotic therapy and underwent incision and drainage. He was dischargedon oral levofloxacin. On 10/16/2020 he was seen at the outpatient podiatry clinic with worsening erythema, and edema at his left foot, instructed to continue oral antibiotics.2 days later on12/01/2020 he noticed purulent material coming from the lateral aspect of hisleftheel, he was evaluated at the podiatry clinic and was referred for direct admission. On his physical examination his blood pressure was 106/63, heart rate 72, respiratory rate 18, temperature 98.9, oxygen saturation 99%, his lungs were clear to auscultation bilaterally, heart S1-S2, present rhythmic, soft abdomen, no lower extremity edema.He had multiple amputation on his right foot, transmetatarsal amputation left foot, large ulcerated lesion at his heel.  Sodium 133, potassium 3.2, chloride 95, bicarb 27, glucose 99, BUN 11, creatinine 1.0, white count 16.2, hemoglobin 11.0,Hct32.8, platelets 337. White count 16.2, hemoglobin 11.0, hematocrit 32.8, platelets 337.  Foot MRI with findings suggesting posterior calcaneous osteomyelitis.  12/04 patient underwent I&D of left complex foot and heel abscess with left calcaneus bone biopsy.  Gram stain with no organisms seen, cultures still in progress. Patient will need IV  antibiotics for calcaneous osteomyelitis.  Patient initially decided to leave the hospital AMA but then changed his mind.  D  COnsulted ID for antibiotic recommendations.     Assessment & Plan:   Principal Problem:   Diabetic foot infection (HCC) Active Problems:   Diabetic ulcer of left foot associated with type 2 diabetes mellitus (HCC)   History of transmetatarsal amputation of left foot (HCC)   Major depressive disorder, recurrent episode, in remission with seasonal pattern (HCC)   Type 2 diabetes mellitus with diabetic neuropathy, with long-term current use of insulin (HCC)   Cellulitis   Acute osteomyelitis of calcaneum, left (HCC)   1. Left diabetic foot infection, posterior calcaneous osteomyelitis.  Cultures from prior hospitalization from left foot abscess/ blood positive for streptococcus intermedius, sensitive to pnc, cephalosporins and levofloxacin.  Wbc down to 8,0. Cultures continue with no growth, his dressings were changed by podiatry yesterday.   Recommendations for prolonged antibiotic therapy for6 weeks of IV antibiotic therapy for suspected calcaneous osteomyelitis, currently with vancomycin and ceftriaxone, will consult with ID for further recommendations.    2. T2DM with hypoglycemia/ dyslipidemia. Liberated diet to regular, glucose continue to be stable with capillary 124 mg/dl, continue close monitoring and coverage with sliding scale.   Onatorvastatin.   3. HTN. Blood pressure control with amlodipine    4. Hypokalemia.clinically resolved, patient tolerating po well.   5. Depression/ tobacco abuse.On sertraline and clonazepam. Smoking cessation, nicotine patch.    Status is: Inpatient  Remains inpatient appropriate because:IV treatments appropriate due to intensity of illness or inability to take PO   Dispo: The patient is from: Home              Anticipated d/c is to: Home              Anticipated d/c  date is: 1 day               Patient currently is not medically stable to d/c.   DVT prophylaxis: Enoxaparin   Code Status:   full  Family Communication:  No family at the bedside     Consultants:   ID   Podiatry   Procedures:  Incision and drainage of complex foot and heel abscess Bone biopsy left calcaneus   Antimicrobials:   Ceftriaxone and vancomycin     Subjective: Patient with no pain at the surgical site, no nausea or vomiting, no chest pain or dyspnea,.   Objective: Vitals:   10/21/20 1601 10/21/20 2012 10/22/20 0500 10/22/20 0943  BP: (!) 120/99 (!) 129/91 131/88 (!) 142/60  Pulse: 65 (!) 102 78 66  Resp: 18 18 17 18   Temp: 98.4 F (36.9 C) 98.4 F (36.9 C) 98.2 F (36.8 C) 98 F (36.7 C)  TempSrc: Oral Oral Oral   SpO2: 99% 99% 100% 95%  Weight:      Height:        Intake/Output Summary (Last 24 hours) at 10/22/2020 1015 Last data filed at 10/22/2020 0600 Gross per 24 hour  Intake 720 ml  Output 1350 ml  Net -630 ml   Filed Weights   10/18/20 1700  Weight: 97.3 kg    Examination:   General: Not in pain or dyspnea,  Neurology: Awake and alert, non focal  E ENT: no pallor, no icterus, oral mucosa moist Cardiovascular: No JVD. S1-S2 present, rhythmic, no gallops, rubs, or murmurs. No lower extremity edema. Pulmonary: positive breath sounds bilaterally, adequate air movement, no wheezing, rhonchi or rales. Gastrointestinal. Abdomen soft and non tender Skin. No rashes Musculoskeletal: right foot with dressing in place.      Data Reviewed: I have personally reviewed following labs and imaging studies  CBC: Recent Labs  Lab 10/18/20 1953 10/19/20 0209 10/20/20 0244 10/21/20 0805 10/22/20 0316  WBC 16.2* 14.6* 10.6* 9.3 8.0  NEUTROABS  --   --  7.9* 6.5 5.5  HGB 11.0* 10.6* 11.3* 11.2* 11.3*  HCT 32.8* 30.3* 34.6* 32.2* 33.1*  MCV 85.4 84.6 86.5 85.4 85.3  PLT 337 286 299 293 321   Basic Metabolic Panel: Recent Labs  Lab 10/18/20 1953  10/19/20 0209 10/20/20 0244 10/21/20 0805  NA 133* 140 138 138  K 3.2* 2.9* 3.7 3.5  CL 95* 98 101 101  CO2 27 28 26 25   GLUCOSE 99 85 95 139*  BUN 11 10 6 7   CREATININE 1.06 0.95 0.67 0.79  CALCIUM 8.2* 8.4* 8.4* 8.6*  MG  --   --  1.8  --    GFR: Estimated Creatinine Clearance: 118.8 mL/min (by C-G formula based on SCr of 0.79 mg/dL). Liver Function Tests: No results for input(s): AST, ALT, ALKPHOS, BILITOT, PROT, ALBUMIN in the last 168 hours. No results for input(s): LIPASE, AMYLASE in the last 168 hours. No results for input(s): AMMONIA in the last 168 hours. Coagulation Profile: No results for input(s): INR, PROTIME in the last 168 hours. Cardiac Enzymes: No results for input(s): CKTOTAL, CKMB, CKMBINDEX, TROPONINI in the last 168 hours. BNP (last 3 results) No results for input(s): PROBNP in the last 8760 hours. HbA1C: No results for input(s): HGBA1C in the last 72 hours. CBG: Recent Labs  Lab 10/21/20 0712 10/21/20 1155 10/21/20 1803 10/21/20 2228 10/22/20 0657  GLUCAP 146* 142* 135* 134* 124*   Lipid Profile: No results for input(s): CHOL, HDL, LDLCALC, TRIG,  CHOLHDL, LDLDIRECT in the last 72 hours. Thyroid Function Tests: No results for input(s): TSH, T4TOTAL, FREET4, T3FREE, THYROIDAB in the last 72 hours. Anemia Panel: No results for input(s): VITAMINB12, FOLATE, FERRITIN, TIBC, IRON, RETICCTPCT in the last 72 hours.    Radiology Studies: I have reviewed all of the imaging during this hospital visit personally     Scheduled Meds: . aspirin  81 mg Oral Daily  . atorvastatin  10 mg Oral Daily  . clonazePAM  1 mg Oral QHS  . enoxaparin (LOVENOX) injection  40 mg Subcutaneous Q24H  . insulin aspart  0-15 Units Subcutaneous TID WC  . nicotine  21 mg Transdermal QPM  . sertraline  150 mg Oral Daily  . sodium chloride flush  3 mL Intravenous Q12H   Continuous Infusions: . cefTRIAXone (ROCEPHIN)  IV    . vancomycin       LOS: 4 days         Lilliahna Schubring Annett Gula, MD

## 2020-10-22 NOTE — TOC Progression Note (Addendum)
Transition of Care [place] [facility]) - Progression Note    Patient Details  Name: Charles Thompson MRN: [id number] Date of Birth: 1961/05/06  Transition of Care [facility]) CM/SW Contact  Charles Lesches, RN Phone Number: 10/22/2020, 10:35 AM  Clinical Narrative:    Presents with L foot  Cellulitis, abscess, osteomyelitis, s/p  Incision and drainage of complex foot and heel abscess and  bone biopsy left calcaneus,  12/5.  Consult received: Will need home health set up on discharge. Dressing change 3 times a week. Flush wound with saline and pack wounds with 1/4 inch iodoform packing, paint betadine along incision and cover with 4x4, kerlix, ACE.... foot wound. NCM also noted pt will need at least 6 weeks of LT I V antibiotics therapy. Referral made with [name]/ [name] for [facility] services ( wound care /IV ABX therapy ... acceptance pending. Referral also made with [facility]/ [name] for LT IV ABX therapy .... acceptance pending.  PICC  line pending for today  Pt is from home alone. States sister Charles Thompson to assist with home infusion therapy. Teaching for home infusion needs to be completed prior to d/c by [name] with [name]) once approval received.   [name] (Sister)     [phone]       [facility] team monitoring and following for needs.....  Expected Discharge Plan: Home w Home Health Services    Expected Discharge Plan and Services Expected Discharge Plan: Home w Home Health Services                           DME Agency:  ([facility]/IV ABX therapy) Date DME Agency Contacted: 10/22/20 Time DME Agency Contacted: [phone] Representative spoke with at DME Agency: [name] Agency: [facility] Date [facility] Agency Contacted: 10/22/20 Time HH Agency Contacted: 1035 Representative spoke with at [facility] Agency: [name]   Social Determinants of Health (SDOH) Interventions    Readmission Risk Interventions No flowsheet data found.

## 2020-10-22 NOTE — Progress Notes (Signed)
DISCHARGE instructions given to patient and patient verbalizes understanding and has no further questions at this time.

## 2020-10-22 NOTE — Plan of Care (Signed)
  Problem: Clinical Measurements: Goal: Ability to avoid or minimize complications of infection will improve 10/22/2020 1725 by Tobi Bastos, RN Outcome: Adequate for Discharge 10/22/2020 1725 by Tobi Bastos, RN Outcome: Progressing   Problem: Skin Integrity: Goal: Skin integrity will improve 10/22/2020 1725 by Tobi Bastos, RN Outcome: Adequate for Discharge 10/22/2020 1725 by Tobi Bastos, RN Outcome: Progressing   Problem: Education: Goal: Knowledge of General Education information will improve Description: Including pain rating scale, medication(s)/side effects and non-pharmacologic comfort measures 10/22/2020 1725 by Tobi Bastos, RN Outcome: Adequate for Discharge 10/22/2020 1725 by Tobi Bastos, RN Outcome: Progressing   Problem: Health Behavior/Discharge Planning: Goal: Ability to manage health-related needs will improve 10/22/2020 1725 by Tobi Bastos, RN Outcome: Adequate for Discharge 10/22/2020 1725 by Tobi Bastos, RN Outcome: Progressing   Problem: Clinical Measurements: Goal: Ability to maintain clinical measurements within normal limits will improve 10/22/2020 1725 by Tobi Bastos, RN Outcome: Adequate for Discharge 10/22/2020 1725 by Tobi Bastos, RN Outcome: Progressing Goal: Will remain free from infection 10/22/2020 1725 by Tobi Bastos, RN Outcome: Adequate for Discharge 10/22/2020 1725 by Tobi Bastos, RN Outcome: Progressing Goal: Diagnostic test results will improve 10/22/2020 1725 by Tobi Bastos, RN Outcome: Adequate for Discharge 10/22/2020 1725 by Tobi Bastos, RN Outcome: Progressing Goal: Respiratory complications will improve 10/22/2020 1725 by Tobi Bastos, RN Outcome: Adequate for Discharge 10/22/2020 1725 by Tobi Bastos, RN Outcome: Progressing Goal: Cardiovascular complication will be avoided 10/22/2020 1725 by Tobi Bastos, RN Outcome: Adequate for Discharge 10/22/2020 1725 by Tobi Bastos,  RN Outcome: Progressing   Problem: Activity: Goal: Risk for activity intolerance will decrease 10/22/2020 1725 by Tobi Bastos, RN Outcome: Adequate for Discharge 10/22/2020 1725 by Tobi Bastos, RN Outcome: Progressing   Problem: Nutrition: Goal: Adequate nutrition will be maintained 10/22/2020 1725 by Tobi Bastos, RN Outcome: Adequate for Discharge 10/22/2020 1725 by Tobi Bastos, RN Outcome: Progressing   Problem: Coping: Goal: Level of anxiety will decrease 10/22/2020 1725 by Tobi Bastos, RN Outcome: Adequate for Discharge 10/22/2020 1725 by Tobi Bastos, RN Outcome: Progressing   Problem: Elimination: Goal: Will not experience complications related to bowel motility 10/22/2020 1725 by Tobi Bastos, RN Outcome: Adequate for Discharge 10/22/2020 1725 by Tobi Bastos, RN Outcome: Progressing Goal: Will not experience complications related to urinary retention 10/22/2020 1725 by Tobi Bastos, RN Outcome: Adequate for Discharge 10/22/2020 1725 by Tobi Bastos, RN Outcome: Progressing   Problem: Pain Managment: Goal: General experience of comfort will improve 10/22/2020 1725 by Tobi Bastos, RN Outcome: Adequate for Discharge 10/22/2020 1725 by Tobi Bastos, RN Outcome: Progressing   Problem: Safety: Goal: Ability to remain free from injury will improve 10/22/2020 1725 by Tobi Bastos, RN Outcome: Adequate for Discharge 10/22/2020 1725 by Tobi Bastos, RN Outcome: Progressing   Problem: Skin Integrity: Goal: Risk for impaired skin integrity will decrease 10/22/2020 1725 by Tobi Bastos, RN Outcome: Adequate for Discharge 10/22/2020 1725 by Tobi Bastos, RN Outcome: Progressing

## 2020-10-22 NOTE — Care Management Important Message (Signed)
Important Message  Patient Details  Name: Charles Thompson MRN: [id number] Date of Birth: [birth year]   Medicare Important Message Given:  Yes - Important Message mailed due to current National Emergency  Verbal consent obtained due to current National Emergency  Relationship to patient: Self Contact Name: Orie Baxendale Call Date: 10/22/20  Time: 1437 Phone: [phone] Outcome: Spoke with contact Important Message mailed to: Other (must enter comment) (patient declined copy of IM)    Eliazar Olivar P Divina Neale 10/22/2020, 2:37 PM

## 2020-10-22 NOTE — Telephone Encounter (Signed)
Spoke with Alecia Lemming RN on 5N about an updated order by Dr. Ardelle Anton.Order states Change dressing before discharge. Flush wounds with saline and pack with 1/4 inch iodoform packing. Paint with betadine and apply 4x4, kerlix, ACE. Alecia Lemming was made aware that this must be completed prior to discharge.

## 2020-10-22 NOTE — Progress Notes (Signed)
Pharmacy Antibiotic Note  Charles Thompson is a 59 y.o. male admitted on 10/18/2020 with L foot cellulitis/diabetic foot.  Patient had recent hospitalization from 10/10/2020 to 10/14/2020 for L foot cellulitis s/p I&D and receiving Unasyn inpatient and discharged on levofloxacin.   Patient presented on 10/18/2020 with purulence on heel. MRI obtained findings suggestive of early osteomyelitis along posterior calcaneus. Pharmacy has been consulted for vancomycin dosing for osteomyelitis. WBC 14.6. Afebrile. Scr 0.95 with current CrCl of 165ml/min appears to be at baseline  Patient wanted to leave AMA on 12/6 and patient was transitioned to Augmentin and doxycycline. Pharmacy now consulted to resume vancomycin.   Plan: Resume vancomycin 1000mg  IV q8h  Monitor renal function, vancomycin levels, cultures/sensitivities, and clinical progression  Height: 6' (182.9 cm) Weight: 97.3 kg (214 lb 8.1 oz) IBW/kg (Calculated) : 77.6  Temp (24hrs), Avg:98.3 F (36.8 C), Min:98.2 F (36.8 C), Max:98.4 F (36.9 C)  Recent Labs  Lab 10/18/20 1953 10/19/20 0209 10/20/20 0244 10/21/20 0805 10/22/20 0316  WBC 16.2* 14.6* 10.6* 9.3 8.0  CREATININE 1.06 0.95 0.67 0.79  --   VANCOTROUGH  --   --   --   --  10*    Estimated Creatinine Clearance: 118.8 mL/min (by C-G formula based on SCr of 0.79 mg/dL).    No Known Allergies  Antimicrobials this admission: Unasyn 12/3 >> 12/6  Vancomycin 12/4 >> 12/6  Augmentin 12/6 >> 12/7  Doxycycline 12/6 >> 12/7  CTX 12/7 >>   Dose adjustments this admission: N/a  Microbiology results: 12/3 bcx: ngtd 12/5 wound: ngtd  Thank you for allowing pharmacy to be a part of this patient's care.  14/5, PharmD Clinical Pharmacist  10/22/2020 9:29 AM

## 2020-10-22 NOTE — Progress Notes (Signed)
I was going to go by to see the patient however he has already been discharged per the unit secretary.

## 2020-10-22 NOTE — Consult Note (Signed)
Date of Admission:  10/18/2020          Reason for Consult: Diabetic foot infection with calcaneal osteomyelitis    Referring Provider: Dr Ella JubileeArrien   Assessment:   1.   Diabetic foot infection with calcaneal osteomyelitis with strep intermedius isolated on cultures in November 2. Hx of TMT amputation left foot 3. Major depressive disorder  Plan:  1. Dose with oritavancin once now and this will be in hiis system x 2 weeks as a systemic antibiotic 2. Start augmentin 875/125 mg and supply with 6 weeks but I intend to keep him on this indefinitely 3. He can see me in clinic in January   Charles RayaKenneth Cedotal has an appointment on 11/18/2020 at 1045 AM with Dr. Daiva EvesVan Dam  [facility] is located in the [facility] at  [address] in [place].  [address], which is located to the left of the elevators.  Phone: [phone]  Fax: [phone]  [email or web address]  He should arrive 15 minutes prior to his appt.   Principal Problem:   Diabetic foot infection (HCC) Active Problems:   Diabetic ulcer of left foot associated with type 2 diabetes mellitus (HCC)   History of transmetatarsal amputation of left foot (HCC)   Major depressive disorder, recurrent episode, in remission with seasonal pattern (HCC)   Type 2 diabetes mellitus with diabetic neuropathy, with long-term current use of insulin (HCC)   Cellulitis   Acute osteomyelitis of calcaneum, left (HCC)   Scheduled Meds: . amoxicillin-clavulanate  1 tablet Oral Q12H  . aspirin  81 mg Oral Daily  . atorvastatin  10 mg Oral Daily  . clonazePAM  1 mg Oral QHS  . enoxaparin (LOVENOX) injection  40 mg Subcutaneous Q24H  . insulin aspart  0-15 Units Subcutaneous TID WC  . nicotine  21 mg Transdermal QPM  . sertraline  150 mg Oral Daily  . sodium chloride flush  3 mL Intravenous Q12H   Continuous Infusions: . oritavancin (ORBACTIV) IVPB     PRN  Meds:.acetaminophen **OR** acetaminophen, oxyCODONE-acetaminophen **AND** [DISCONTINUED] oxyCODONE, polyethylene glycol  HPI: Charles Thompson is a 59 y.o. male  With past medical history of type 2 diabetes, neuropathy, prior left transmetatarsal amputation, hypertension, hyperlipidemia, anxiety/depression, and tobacco use who follows with Dr. Samuella CotaPrice of the podiatry clinic and presented on November 25 with progressive left foot pain and ulcer drainage.  He was admitted for sepsis related to the same.  Left foot x-ray showed focal soft tissue gas in the superficial subcutaneous tissue over the resection stump at the level of the second metatarsal but no evidence of osteomyelitis he was started on vancomycin, cefepime, Flagyl.  He went to the OR for incision and drainage with Dr. Logan BoresEvans on November 25.  There was severe infection found in the OR with heavy purulence found throughout the entire plantar structures of the foot. He had repeat debridement on 10/13/2020.  Ulcers on both the 25th and 28 yielded Streptococcus intermedius. It was also seen in his bloodstream on cultures from the 25th. He was seen by my partner Dr. Earlene PlaterWallace who had told him he should have TTE at that time and had warned patient he would need BKA for cure. Patient went home on oral levaquin.  While on the Levaquin he was having worsening redness in the posterior portion of the foot.  He ultimately came to the ER was found to have purulence in the medial lateral aspect  of the heel.  Blood cultures were taken on admission and they were fortunately negative.  Patient was taken the operating room again I went underwent incision and drainage of an abscess of the foot as well as a bone biopsy of the left calcaneus.  Cultures so far unrevealing.  The patient has been on Unasyn then also vancomycin and within the last 2 days given Augmentin ceftriaxone and doxycycline.  He was apparently about to leave AGAINST MEDICAL ADVICE again yesterday but was  convinced to stay.  I am concerned given his desires to leave AGAINST MEDICAL ADVICE that PICC line would not be the most appropriate therapy.  The other issue is is that I do not see that a PICC line with parenteral antibiotics is going to cure this infection.  That is only going to happen with a below the knee amputation.  I do think one intermediate step that we can give is to give him a dose oritavancin which will essentially give him the equivalent of an IV antibiotic for 2 weeks which would have coverage against MRSA as well as streptococcal species.  I like to have him start Augmentin again and continue this until he sees me in the clinic in January but with plans for continuing it indefinitely.  We will sign off for now please call with further questions.  We will follow up on his intraoperative cultures.  Review of Systems: Review of Systems  Constitutional: Negative for chills, diaphoresis, fever, malaise/fatigue and weight loss.  HENT: Negative for congestion, hearing loss, sore throat and tinnitus.   Eyes: Negative for blurred vision and double vision.  Respiratory: Negative for cough, sputum production, shortness of breath and wheezing.   Cardiovascular: Negative for chest pain, palpitations and leg swelling.  Gastrointestinal: Negative for abdominal pain, blood in stool, constipation, diarrhea, heartburn, melena, nausea and vomiting.  Genitourinary: Negative for dysuria, flank pain and hematuria.  Musculoskeletal: Negative for back pain, falls, joint pain and myalgias.  Skin: Negative for itching and rash.  Neurological: Positive for sensory change. Negative for dizziness, focal weakness, loss of consciousness, weakness and headaches.  Endo/Heme/Allergies: Does not bruise/bleed easily.  Psychiatric/Behavioral: Negative for depression, memory loss and suicidal ideas. The patient is not nervous/anxious.     Past Medical History:  Diagnosis Date  . Anxiety   . Arthritis     neck  . Chronic ulcer of right foot (HCC)   . Diabetic peripheral neuropathy (HCC)   . First degree heart block   . GERD (gastroesophageal reflux disease)   . History of osteomyelitis    bilateral feet  . Hyperlipidemia   . Hypertension    followed by pcp  (03-07-2020 per pt never had a stress test)  . MDD (major depressive disorder)   . Type 2 diabetes mellitus with insulin therapy (HCC)    followed by pcp   (03-07-2020  pt stated checks blood sugar twice dialy,  fasting sugar--- 100-160)    Social History   Tobacco Use  . Smoking status: Current Every Day Smoker    Packs/day: 0.75    Types: Cigarettes  . Smokeless tobacco: Never Used  Vaping Use  . Vaping Use: Never used  Substance Use Topics  . Alcohol use: Yes    Alcohol/week: 0.0 standard drinks    Comment: occ  . Drug use: Not Currently    Types: Marijuana    Comment: "not lately"    Family History  Problem Relation Age of Onset  . Diabetes Other  No Known Allergies  OBJECTIVE: Blood pressure (!) 142/60, pulse 66, temperature 98 F (36.7 C), resp. rate 18, height 6' (1.829 m), weight 97.3 kg, SpO2 95 %.  Physical Exam Constitutional:      Appearance: He is well-developed.  HENT:     Head: Normocephalic and atraumatic.  Eyes:     Conjunctiva/sclera: Conjunctivae normal.  Cardiovascular:     Rate and Rhythm: Normal rate and regular rhythm.  Pulmonary:     Effort: Pulmonary effort is normal. No respiratory distress.     Breath sounds: No wheezing.  Abdominal:     General: There is no distension.     Palpations: Abdomen is soft.  Musculoskeletal:        General: No tenderness. Normal range of motion.     Cervical back: Normal range of motion and neck supple.  Skin:    General: Skin is warm and dry.     Coloration: Skin is not pale.     Findings: No erythema or rash.  Neurological:     General: No focal deficit present.     Mental Status: He is alert and oriented to person, place, and time.   Psychiatric:        Mood and Affect: Mood normal.        Behavior: Behavior normal.        Thought Content: Thought content normal.        Judgment: Judgment normal.    Left foot bandaged  Lab Results Lab Results  Component Value Date   WBC 8.0 10/22/2020   HGB 11.3 (L) 10/22/2020   HCT 33.1 (L) 10/22/2020   MCV 85.3 10/22/2020   PLT 321 10/22/2020    Lab Results  Component Value Date   CREATININE 0.79 10/21/2020   BUN 7 10/21/2020   NA 138 10/21/2020   K 3.5 10/21/2020   CL 101 10/21/2020   CO2 25 10/21/2020    Lab Results  Component Value Date   ALT 20 10/11/2020   AST 17 10/11/2020   ALKPHOS 119 10/11/2020   BILITOT 1.5 (H) 10/11/2020     Microbiology: Recent Results (from the past 240 hour(s))  Aerobic/Anaerobic Culture (surgical/deep wound)     Status: None   Collection Time: 10/13/20  8:13 AM   Specimen: PATH Other; Tissue  Result Value Ref Range Status   Specimen Description ABSCESS LEFT FOOT  Final   Special Requests SPEC A LEFT FOOT ABSCESS  Final   Gram Stain NO WBC SEEN NO ORGANISMS SEEN   Final   Culture   Final    RARE STREPTOCOCCUS INTERMEDIUS NO ANAEROBES ISOLATED Performed at [facility], [address] [place], [place] [address]    Report Status 10/18/2020 FINAL  Final   Organism ID, Bacteria STREPTOCOCCUS INTERMEDIUS  Final      Susceptibility   Streptococcus intermedius - MIC*    PENICILLIN <=0.06 SENSITIVE Sensitive     CEFTRIAXONE 0.25 SENSITIVE Sensitive     ERYTHROMYCIN <=0.12 SENSITIVE Sensitive     LEVOFLOXACIN 0.5 SENSITIVE Sensitive     VANCOMYCIN 0.5 SENSITIVE Sensitive     * RARE STREPTOCOCCUS INTERMEDIUS  Culture, blood (routine x 2)     Status: None   Collection Time: 10/14/20  2:50 PM   Specimen: BLOOD RIGHT ARM  Result Value Ref Range Status   Specimen Description BLOOD RIGHT ARM  Final   Special Requests   Final    BOTTLES DRAWN AEROBIC AND ANAEROBIC Blood Culture adequate volume  Culture   Final    NO  GROWTH 5 DAYS Performed at [facility], [address] [place], [place] [address]    Report Status 10/19/2020 FINAL  Final  Culture, blood (routine x 2)     Status: None   Collection Time: 10/14/20  2:50 PM   Specimen: BLOOD LEFT HAND  Result Value Ref Range Status   Specimen Description BLOOD LEFT HAND  Final   Special Requests   Final    BOTTLES DRAWN AEROBIC AND ANAEROBIC Blood Culture adequate volume   Culture   Final    NO GROWTH 5 DAYS Performed at [facility], [address], [place], [place] [address]    Report Status 10/19/2020 FINAL  Final  Culture, blood (routine x 2)     Status: None (Preliminary result)   Collection Time: 10/18/20  7:53 PM   Specimen: BLOOD LEFT ARM  Result Value Ref Range Status   Specimen Description BLOOD LEFT ARM  Final   Special Requests   Final    BOTTLES DRAWN AEROBIC AND ANAEROBIC Blood Culture results may not be optimal due to an excessive volume of blood received in culture bottles   Culture   Final    NO GROWTH 4 DAYS Performed at [facility], [address], [place], [place] [address]    Report Status PENDING  Incomplete  Culture, blood (routine x 2)     Status: None (Preliminary result)   Collection Time: 10/18/20  7:53 PM   Specimen: BLOOD RIGHT ARM  Result Value Ref Range Status   Specimen Description BLOOD RIGHT ARM  Final   Special Requests   Final    BOTTLES DRAWN AEROBIC AND ANAEROBIC Blood Culture results may not be optimal due to an excessive volume of blood received in culture bottles   Culture   Final    NO GROWTH 4 DAYS Performed at [facility], [address] [place], [place] [address]    Report Status PENDING  Incomplete  Resp Panel by RT-PCR (Flu A&B, Covid)     Status: None   Collection Time: 10/20/20  6:51 AM  Result Value Ref Range Status   SARS Coronavirus 2 by RT PCR NEGATIVE NEGATIVE Final    Comment: (NOTE) SARS-CoV-2 target nucleic acids are NOT DETECTED.  The SARS-CoV-2 RNA  is generally detectable in upper respiratory specimens during the acute phase of infection. The lowest concentration of SARS-CoV-2 viral copies this assay can detect is 138 copies/mL. A negative result does not preclude SARS-Cov-2 infection and should not be used as the sole basis for treatment or other patient management decisions. A negative result may occur with  improper specimen collection/handling, submission of specimen other than nasopharyngeal swab, presence of viral mutation(s) within the areas targeted by this assay, and inadequate number of viral copies(<138 copies/mL). A negative result must be combined with clinical observations, patient history, and epidemiological information. The expected result is Negative.  Fact Sheet for Patients:  BloggerCourse.com  Fact Sheet for Healthcare Providers:  SeriousBroker.it  This test is no t yet approved or cleared by the [place] FDA and  has been authorized for detection and/or diagnosis of SARS-CoV-2 by FDA under an Emergency Use Authorization (EUA). This EUA will remain  in effect (meaning this test can be used) for the duration of the COVID-19 declaration under Section 564(b)(1) of the Act, 21 U.S.C.section 360bbb-3(b)(1), unless the authorization is terminated  or revoked sooner.  Influenza A by PCR NEGATIVE NEGATIVE Final   Influenza B by PCR NEGATIVE NEGATIVE Final    Comment: (NOTE) The Xpert Xpress SARS-CoV-2/FLU/RSV plus assay is intended as an aid in the diagnosis of influenza from Nasopharyngeal swab specimens and should not be used as a sole basis for treatment. Nasal washings and aspirates are unacceptable for Xpert Xpress SARS-CoV-2/FLU/RSV testing.  Fact Sheet for Patients: BloggerCourse.com  Fact Sheet for Healthcare Providers: SeriousBroker.it  This test is not yet approved or cleared by the  [place] FDA and has been authorized for detection and/or diagnosis of SARS-CoV-2 by FDA under an Emergency Use Authorization (EUA). This EUA will remain in effect (meaning this test can be used) for the duration of the COVID-19 declaration under Section 564(b)(1) of the Act, 21 U.S.C. section 360bbb-3(b)(1), unless the authorization is terminated or revoked.  Performed at [facility], [address] [place], [place] [address]   Aerobic/Anaerobic Culture (surgical/deep wound)     Status: None (Preliminary result)   Collection Time: 10/20/20 10:01 AM   Specimen: PATH Bone biopsy; Tissue  Result Value Ref Range Status   Specimen Description BONE  Final   Special Requests LEFT CALCANEOUS BONE SPEC A  Final   Gram Stain   Final    RARE WBC PRESENT, PREDOMINANTLY MONONUCLEAR NO ORGANISMS SEEN    Culture   Final    NO GROWTH 2 DAYS Performed at [facility], [address] [place], [place] [address]    Report Status PENDING  Incomplete  Aerobic/Anaerobic Culture (surgical/deep wound)     Status: None (Preliminary result)   Collection Time: 10/20/20 10:04 AM   Specimen: Wound  Result Value Ref Range Status   Specimen Description WOUND  Final   Special Requests LEFT CALCANEOUS WD SUPERFICIAL SPEC B  Final   Gram Stain   Final    RARE WBC PRESENT, PREDOMINANTLY PMN NO ORGANISMS SEEN    Culture   Final    NO GROWTH 2 DAYS Performed at [facility], [address], [place], [place] [address]    Report Status PENDING  Incomplete  Aerobic/Anaerobic Culture (surgical/deep wound)     Status: None (Preliminary result)   Collection Time: 10/20/20 10:06 AM   Specimen: Wound  Result Value Ref Range Status   Specimen Description WOUND  Final   Special Requests LEFT CALCANEOUS WD DEEP SPEC C  Final   Gram Stain   Final    RARE WBC PRESENT,BOTH PMN AND MONONUCLEAR NO ORGANISMS SEEN    Culture   Final    NO GROWTH 2 DAYS Performed at [facility],  [address] [place], [place] [address]    Report Status PENDING  Incomplete    Acey Lav, MD [facility] for Infectious Disease [facility] [phone] pager  10/22/2020, 12:11 PM

## 2020-10-23 ENCOUNTER — Telehealth: Payer: Self-pay

## 2020-10-23 LAB — CULTURE, BLOOD (ROUTINE X 2)
Culture: NO GROWTH
Culture: NO GROWTH

## 2020-10-23 NOTE — Telephone Encounter (Signed)
Patient called wondering if he should be taking the Augmentin or waiting for two weeks. Per Dr. Clinton Gallant note, the oritavancin will be in his system two weeks and he should start the Augmentin. Patient requesting clarification. Will route to provider.   Sandie Ano, RN

## 2020-10-23 NOTE — Telephone Encounter (Signed)
He can start now on give him better anaerobic coverage though he isn't grown anaerobes so far  just strep

## 2020-10-24 NOTE — Telephone Encounter (Signed)
Spoke with patient to let him know to start the Augmentin per Dr. Daiva Eves. Patient verbalized understanding and has no further questions.   Sandie Ano, RN

## 2020-10-25 ENCOUNTER — Ambulatory Visit (INDEPENDENT_AMBULATORY_CARE_PROVIDER_SITE_OTHER): Payer: Medicare Other | Admitting: Podiatry

## 2020-10-25 ENCOUNTER — Other Ambulatory Visit: Payer: Self-pay

## 2020-10-25 ENCOUNTER — Ambulatory Visit: Payer: Self-pay | Admitting: Podiatry

## 2020-10-25 VITALS — Temp 98.4°F

## 2020-10-25 DIAGNOSIS — L089 Local infection of the skin and subcutaneous tissue, unspecified: Secondary | ICD-10-CM

## 2020-10-25 DIAGNOSIS — L02612 Cutaneous abscess of left foot: Secondary | ICD-10-CM

## 2020-10-25 DIAGNOSIS — E11628 Type 2 diabetes mellitus with other skin complications: Secondary | ICD-10-CM

## 2020-10-25 DIAGNOSIS — L03116 Cellulitis of left lower limb: Secondary | ICD-10-CM

## 2020-10-25 LAB — AEROBIC/ANAEROBIC CULTURE W GRAM STAIN (SURGICAL/DEEP WOUND)
Culture: NO GROWTH
Culture: NO GROWTH

## 2020-10-25 NOTE — H&P (View-Only) (Signed)
  Subjective:  Patient ID: Charles Thompson, male    DOB: 01/26/1961,  MRN: [id number]  Chief Complaint  Patient presents with  . Wound Check    Pt states he feels okay, denies fever/chills/nausea/vomiting. There is drainage noted from foot wounds.    59 y.o. male presents for wound care. Hx confirmed with patient. Underwent debridement and irrigation 10/20/20. He states his pain is improving and he is almost off of pain medication. He has not changed his dressing since discharge. Objective:  Physical Exam: Wound Location: left heel Wound Measurement: 4x2, 5x4 Wound Base: Fibrotic slough Necrotic eschar Peri-wound: Macerated Exudate: Moderate amount Serosanguinous exudate There is no exposed bone to either wound of the heel and no probe to bone noted.  Incision at medial aspect of the foot well appearing with resolving cellulitis, healing well and appears to be healing by secondary intention.     Assessment:   1. Cellulitis of left foot   2. Abscess of left heel   3. Diabetic foot infection (HCC)   4. Abscess of left foot    Plan:  Patient was evaluated and treated and all questions answered.  Ulcer left heel -The midfoot wound appears improved since hospitalization. The wound of the calcaneus should improve with additional debridement. He does not appear to have continued signs of acute infection. -Offered refill of pain medication today. Declined. -He is on Augmentin. His cultures grew intermediate Staph pan sensitive. The bone culture did not have any growth. Continue current regimen. He has follow-up with ID on 11/18/20. -Would recommend repeat debridement for the wound given fibrosis. Dressed with betadine WTD today. Patient to change wound daily as such for debridement and antisepsis. -After surgery would recommend Santyl dressing changes. -Continue boot immobilization  No follow-ups on file.  

## 2020-10-25 NOTE — Progress Notes (Signed)
  Subjective:  Patient ID: Charles Thompson, male    DOB: Dec 02, 1960,  MRN: [id number]  Chief Complaint  Patient presents with  . Wound Check    Pt states he feels okay, denies fever/chills/nausea/vomiting. There is drainage noted from foot wounds.    59 y.o. male presents for wound care. Hx confirmed with patient. Underwent debridement and irrigation 10/20/20. He states his pain is improving and he is almost off of pain medication. He has not changed his dressing since discharge. Objective:  Physical Exam: Wound Location: left heel Wound Measurement: 4x2, 5x4 Wound Base: Fibrotic slough Necrotic eschar Peri-wound: Macerated Exudate: Moderate amount Serosanguinous exudate There is no exposed bone to either wound of the heel and no probe to bone noted.  Incision at medial aspect of the foot well appearing with resolving cellulitis, healing well and appears to be healing by secondary intention.     Assessment:   1. Cellulitis of left foot   2. Abscess of left heel   3. Diabetic foot infection (HCC)   4. Abscess of left foot    Plan:  Patient was evaluated and treated and all questions answered.  Ulcer left heel -The midfoot wound appears improved since hospitalization. The wound of the calcaneus should improve with additional debridement. He does not appear to have continued signs of acute infection. -Offered refill of pain medication today. Declined. -He is on Augmentin. His cultures grew intermediate Staph pan sensitive. The bone culture did not have any growth. Continue current regimen. He has follow-up with ID on 11/18/20. -Would recommend repeat debridement for the wound given fibrosis. Dressed with betadine WTD today. Patient to change wound daily as such for debridement and antisepsis. -After surgery would recommend Santyl dressing changes. -Continue boot immobilization  No follow-ups on file.

## 2020-10-28 ENCOUNTER — Telehealth: Payer: Self-pay | Admitting: Podiatry

## 2020-10-28 NOTE — Telephone Encounter (Signed)
DOS: 10/31/2020  Procedures: Wound Debridements Lt (34068) with Irrigation Lt ([id number]) and Possible Mid Foot Wound Closure Lt ([id number])  [employer] Medicare Effective From 11/17/2019 - 11/15/2020  Deductible: $0 Out of Pocket: $6,700 with $0,992.78 met and $5,076.87 remaining.  CoInsurance: 100% Copay: $295  Notification or Prior Authorization is not required for the requested services  Decision ID #:[id number]  The number above acknowledges your inquiry and our response. Please write this number down and refer to it for future inquiries. Coverage and payment for an item or service is governed by the member's benefit plan document, and, if applicable, the provider's participation agreement with the Health Plan.

## 2020-10-29 ENCOUNTER — Other Ambulatory Visit (HOSPITAL_COMMUNITY)
Admission: RE | Admit: 2020-10-29 | Discharge: 2020-10-29 | Disposition: A | Discharge status: Home / Self Care | Payer: Medicare Other | Source: Ambulatory Visit | Attending: Podiatry | Admitting: Podiatry

## 2020-10-29 ENCOUNTER — Encounter (HOSPITAL_BASED_OUTPATIENT_CLINIC_OR_DEPARTMENT_OTHER): Payer: Self-pay | Admitting: Podiatry

## 2020-10-29 DIAGNOSIS — Z20822 Contact with and (suspected) exposure to covid-19: Secondary | ICD-10-CM | POA: Insufficient documentation

## 2020-10-29 DIAGNOSIS — Z01812 Encounter for preprocedural laboratory examination: Secondary | ICD-10-CM | POA: Insufficient documentation

## 2020-10-29 LAB — SARS CORONAVIRUS 2 (TAT 6-24 HRS): SARS Coronavirus 2: NEGATIVE

## 2020-10-29 NOTE — Progress Notes (Addendum)
ADDENDUM:  Received pt's pcp, Dr Wylene Simmer, H&P dated 10-30-2020 via fax from dr price office, placed in chart.   Spoke w/ via phone for pre-op interview--- PT Lab needs dos---- Istat              Lab results------ current ekg in epic/ chart COVID test ------ 10-29-2020 @ 1425 Arrive at ------- 0530 NPO after MN  Medications to take morning of surgery ----- Prilosec Diabetic medication ----- do not do toujeo insulin morning of surgery and night before surgery do half dose. Take synjardy as usual night before surgery Patient Special Instructions ----- n/a Pre-Op special Istructions ----- have not received pt's pcp H&P from dr price office yet.  Per pt he has appointment with pcp, dr Wylene Simmer, tomorrow. Patient verbalized understanding of instructions that were given at this phone interview. Patient denies shortness of breath, chest pain, fever, cough at this phone interview.   Anesthesia :  HTN, DM2, recent hospital admission 10-10-2020 for left lower leg with  cellulitis w/ left foot ulcer and discharged 10-22-2020.     PCP: Dr Wylene Simmer (per pt has f/u appointment 10-30-2020 and for H&P) Cardiologist : no Chest x-ray : no EKG : 10-19-2020 epic Echo : no Stress test: per pt never had one done Cardiac Cath :  no Activity level:  Per pt does get sob w/ exertion Sleep Study/ CPAP : no Fasting Blood Sugar : 120    / Checks Blood Sugar -- times a day:   Twice daily Blood Thinner/ Instructions /Last Dose: NO ASA / Instructions/ Last Dose :  ASA 81mg / per pt has been given instructions if to stop or not, advised pt to call dr price or speak with his pcp

## 2020-10-30 ENCOUNTER — Other Ambulatory Visit: Payer: Self-pay

## 2020-10-30 ENCOUNTER — Encounter (HOSPITAL_BASED_OUTPATIENT_CLINIC_OR_DEPARTMENT_OTHER): Payer: Self-pay | Admitting: Podiatry

## 2020-10-30 ENCOUNTER — Telehealth (INDEPENDENT_AMBULATORY_CARE_PROVIDER_SITE_OTHER): Payer: Medicare Other | Admitting: Psychiatry

## 2020-10-30 DIAGNOSIS — F3340 Major depressive disorder, recurrent, in remission, unspecified: Secondary | ICD-10-CM | POA: Diagnosis not present

## 2020-10-30 MED ORDER — SERTRALINE HCL 100 MG PO TABS
100.0000 mg | ORAL_TABLET | Freq: Two times a day (BID) | ORAL | 1 refills | Status: DC
Start: 2020-10-30 — End: 2021-02-05

## 2020-10-30 MED ORDER — CLONAZEPAM 1 MG PO TABS: 1.0000 mg | ORAL_TABLET | Freq: Every day | ORAL | 1 refills | Status: AC

## 2020-10-30 NOTE — Anesthesia Preprocedure Evaluation (Addendum)
Anesthesia Evaluation  Patient identified by MRN, date of birth, ID band Patient awake    Reviewed: Allergy & Precautions, NPO status , Patient's Chart, lab work & pertinent test results  Airway Mallampati: II  TM Distance: >3 FB Neck ROM: Full    Dental  (+) Teeth Intact, Dental Advisory Given   Pulmonary Current SmokerPatient did not abstain from smoking.,  <1ppd, no inhalers   Pulmonary exam normal breath sounds clear to auscultation       Cardiovascular hypertension, Pt. on medications Normal cardiovascular exam Rhythm:Regular Rate:Normal     Neuro/Psych PSYCHIATRIC DISORDERS Anxiety Depression Diabetic peripheral neuropathy  Neuromuscular disease    GI/Hepatic Neg liver ROS, GERD  Medicated and Controlled,  Endo/Other  diabetes, Well Controlled, Type 2, Insulin DependentLast a1c 5.9  Renal/GU negative Renal ROS  negative genitourinary   Musculoskeletal  (+) Arthritis , Osteoarthritis,  Chronic ulcer left heel 2/2 T2DM   Abdominal   Peds  Hematology negative hematology ROS (+)   Anesthesia Other Findings   Reproductive/Obstetrics negative OB ROS                           Anesthesia Physical Anesthesia Plan  ASA: III  Anesthesia Plan: MAC   Post-op Pain Management:    Induction:   PONV Risk Score and Plan: 1 and Treatment may vary due to age or medical condition, Propofol infusion and TIVA  Airway Management Planned: Natural Airway and Nasal Cannula  Additional Equipment: None  Intra-op Plan:   Post-operative Plan:   Informed Consent: I have reviewed the patients History and Physical, chart, labs and discussed the procedure including the risks, benefits and alternatives for the proposed anesthesia with the patient or authorized representative who has indicated his/her understanding and acceptance.       Plan Discussed with: CRNA  Anesthesia Plan Comments:        Anesthesia Quick Evaluation

## 2020-10-30 NOTE — Progress Notes (Signed)
BH MD/PA/NP OP Progress Note  10/30/2020 3:37 PM Charles Thompson  MRN:  [id number] Interview was conducted by phone and I verified that I was speaking with the correct person using two identifiers. I discussed the limitations of evaluation and management by telemedicine and  the availability of in person appointments. Patient expressed understanding and agreed to proceed. Participants in the visit: patient (location - home); physician (location - home office).  Chief Complaint: "I am doing fine".  HPI: 59yo male with hx of depression and anxiety which worsened to a point of having SI in November last year.He appears to have a seasonal pattern of depression.He has hadfurtherimprovementin mood afterZoloftwas increased to 150 mg. He reports having muscle twitches at night which interfere with his sleep. He has been having those for a long time. Heno longertakes 25 mg ofhydroxyzinefor anxiety.He lives alone, limited if any social support. He denies feeling suicidaland feels that his mood has sufficiently improved so no further med adjustments are necessary at this time.He did not like zolpidem and uses melatonin for insomnia with some benefit. We have tried Mirapex, then Requip but neither has helped with RLS and neither did gabapentin 300 mg (taken bid for diabetic neuropathy). He does however reports clear improvement and better sleep with 1 mg of clonazepam at bedtime.   Visit Diagnosis:    ICD-10-CM   1. Major depressive disorder, recurrent episode, in remission with seasonal pattern (HCC)  F33.40     Past Psychiatric History: Please see intake H&P.  Past Medical History:  Past Medical History:  Diagnosis Date  . Anxiety   . Arthritis    neck  . Cellulitis of left leg 10/10/2020   hospital admission in epic, discharged 10-22-2020  . Chronic ulcer of right foot (HCC)   . Diabetic peripheral neuropathy (HCC)   . Diabetic ulcer of left foot (HCC)   . First degree heart  block   . GERD (gastroesophageal reflux disease)   . History of osteomyelitis    bilateral feet  . Hyperlipidemia   . Hypertension    followed by pcp  (12-14--2021 per pt never had a stress test)  . MDD (major depressive disorder)   . Type 2 diabetes mellitus with insulin therapy (HCC)    followed by pcp   (10-29-2020  pt stated checks blood sugar twice dialy,  fasting sugar--- 120)    Past Surgical History:  Procedure Laterality Date  . ALLOGRAFT APPLICATION Right 03/19/2020   Procedure: APPLICATION OF INTEGRA;  Surgeon: Park Liter, DPM;  Location: [facility];  Service: Podiatry;  Laterality: Right;  . AMPUTATION Left 06/22/2018   Procedure: LEFT FOOT FIRST RAY AMPUTATION;  Surgeon: Nadara Mustard, MD;  Location: [facility] OR;  Service: Orthopedics;  Laterality: Left;  . AMPUTATION Left 09/09/2018   Procedure: Left Transmetatarsal Amputation;  Surgeon: Nadara Mustard, MD;  Location: [facility] OR;  Service: Orthopedics;  Laterality: Left;  . BONE BIOPSY Left 10/20/2020   Procedure: BONE BIOPSY;  Surgeon: Park Liter, DPM;  Location: [facility] OR;  Service: Podiatry;  Laterality: Left;  . EYE SURGERY    . I & D EXTREMITY Left 10/13/2020   Procedure: IRRIGATION AND DEBRIDEMENT FOOT;  Surgeon: [name], DPM;  Location: [facility] OR;  Service: Podiatry;  Laterality: Left;  . I & D EXTREMITY Left 10/10/2020   Procedure: IRRIGATION AND DEBRIDEMENT OF FOOT;  Surgeon: [name], DPM;  Location: [facility] OR;  Service: Podiatry;  Laterality: Left;  . I &  D EXTREMITY Left 10/20/2020   Procedure: IRRIGATION AND DEBRIDEMENT OF COMPLEX WOUND HEEL;  Surgeon: Park Liter, DPM;  Location: [facility] OR;  Service: Podiatry;  Laterality: Left;  . TENDON LENGTHENING Right 03/19/2020   Procedure: TENDOACHILLES LENGTHENING;  Surgeon: Park Liter, DPM;  Location: [facility];  Service: Podiatry;  Laterality: Right;  . TOE AMPUTATION Right 12-11-2015  @[facility]    great toe  . WOUND DEBRIDEMENT Right  03/19/2020   Procedure: DEBRIDEMENT WOUND;  Surgeon: 05/19/2020, DPM;  Location: [facility];  Service: Podiatry;  Laterality: Right;    Family Psychiatric History: None.  Family History:  Family History  Problem Relation Age of Onset  . Diabetes Other     Social History:  Social History   Socioeconomic History  . Marital status: Single    Spouse name: Not on file  . Number of children: Not on file  . Years of education: Not on file  . Highest education level: Not on file  Occupational History  . Not on file  Tobacco Use  . Smoking status: Current Every Day Smoker    Packs/day: 0.75    Types: Cigarettes  . Smokeless tobacco: Never Used  Vaping Use  . Vaping Use: Never used  Substance and Sexual Activity  . Alcohol use: Yes    Alcohol/week: 0.0 standard drinks    Comment: occ  . Drug use: Not Currently    Types: Marijuana    Comment: "not lately"  . Sexual activity: Not Currently  Other Topics Concern  . Not on file  Social History Narrative  . Not on file   Social Determinants of Health   Financial Resource Strain: Not on file  Food Insecurity: Not on file  Transportation Needs: Not on file  Physical Activity: Not on file  Stress: Not on file  Social Connections: Not on file    Allergies: No Known Allergies  Metabolic Disorder Labs: Lab Results  Component Value Date   HGBA1C 5.9 (H) 10/10/2020   MPG 122.63 10/10/2020   MPG 142.72 09/09/2018   No results found for: PROLACTIN No results found for: CHOL, TRIG, HDL, CHOLHDL, VLDL, LDLCALC No results found for: TSH  Therapeutic Level Labs: No results found for: LITHIUM No results found for: VALPROATE No components found for:  CBMZ  Current Medications: Current Outpatient Medications  Medication Sig Dispense Refill  . amLODipine (NORVASC) 10 MG tablet Take 10 mg by mouth every evening.    09/11/2018 amoxicillin-clavulanate (AUGMENTIN) 875-125 MG tablet Take 1 tablet by mouth every 12  (twelve) hours. 84 tablet 0  . aspirin 81 MG tablet Take 81 mg by mouth daily.    Marland Kitchen atorvastatin (LIPITOR) 10 MG tablet Take 10 mg by mouth at bedtime.    . clonazePAM (KLONOPIN) 1 MG tablet Take 1 tablet (1 mg total) by mouth at bedtime. 90 tablet 1  . Empagliflozin-metFORMIN HCl (SYNJARDY) 12.03-999 MG TABS Take 1 tablet by mouth at bedtime.    11-25-1990 ibuprofen (ADVIL) 200 MG tablet Take 400 mg by mouth every 6 (six) hours as needed for headache (pain).    . insulin glargine, 1 Unit Dial, (TOUJEO SOLOSTAR) 300 UNIT/ML Solostar Pen Inject 50 Units into the skin See admin instructions. Inject 50 units subcutaneously twice daily - noon and bedtime    . Lancets (ONETOUCH DELICA PLUS LANCET33G) MISC USE TO TEST BLOOD SUGAR THREE TIMES DAILY    . lisinopril-hydrochlorothiazide (ZESTORETIC) 20-12.5 MG tablet Take 1 tablet by mouth  daily. (Patient taking differently: Take 1 tablet by mouth every evening.)    . nicotine (NICODERM CQ - DOSED IN MG/24 HOURS) 21 mg/24hr patch Place 1 patch (21 mg total) onto the skin daily. (Patient not taking: Reported on 10/29/2020) 28 patch 0  . omeprazole (PRILOSEC) 20 MG capsule Take 20 mg by mouth daily as needed (heartburn).     Letta Pate VERIO test strip USE TO TEST BLOOD SUGAR THREE TIMES DAILY    . sertraline (ZOLOFT) 100 MG tablet Take 1 tablet (100 mg total) by mouth 2 (two) times daily. Take one tablet (100 mg) by mouth once or twice daily 180 tablet 1   No current facility-administered medications for this visit.     Psychiatric Specialty Exam: Review of Systems  All other systems reviewed and are negative.   There were no vitals taken for this visit.There is no height or weight on file to calculate BMI.  General Appearance: NA  Eye Contact:  NA  Speech:  Clear and Coherent and Normal Rate  Volume:  Normal  Mood:  Euthymic  Affect:  NA  Thought Process:  Goal Directed and Linear  Orientation:  Full (Time, Place, and Person)  Thought Content: Logical    Suicidal Thoughts:  No  Homicidal Thoughts:  No  Memory:  Immediate;   Good Recent;   Good Remote;   Good  Judgement:  Good  Insight:  Good  Psychomotor Activity:  NA  Concentration:  Concentration: Good  Recall:  Good  Fund of Knowledge: Good  Language: Good  Akathisia:  Negative  Handed:  Right  AIMS (if indicated): not done  Assets:  Communication Skills Desire for Improvement Financial Resources/Insurance Housing Social Support  ADL's:  Intact  Cognition: WNL  Sleep:  Good    Assessment and Plan: 59yo male with hx of depression and anxiety which worsened to a point of having SI in November last year.He appears to have a seasonal pattern of depression.He has hadfurtherimprovementin mood afterZoloftwas increased to 150 mg. He reports having muscle twitches at night which interfere with his sleep. He has been having those for a long time. Heno longertakes 25 mg ofhydroxyzinefor anxiety.He lives alone, limited if any social support. He denies feeling suicidaland feels that his mood has sufficiently improved so no further med adjustments are necessary at this time.He did not like zolpidem and uses melatonin for insomnia with some benefit. We have tried Mirapex, then Requip but neither has helped with RLS and neither did gabapentin 300 mg (taken bid for diabetic neuropathy). He does however reports clear improvement and better sleep with 1 mg of clonazepam at bedtime.  Dx: MDD in remission (seasonal pattern); RLS  Plan: Continue sertraline 200 mg daily and clonazepam 1 mg at HS for RLS/insomnia.Next medication management appointment in6 months.The plan was discussed with patient who had an opportunity to ask questions and these were all answered. I spend75minutes inphone consultation with the patient.   Magdalene Patricia, MD 10/30/2020, 3:37 PM

## 2020-10-31 ENCOUNTER — Ambulatory Visit (HOSPITAL_BASED_OUTPATIENT_CLINIC_OR_DEPARTMENT_OTHER): Payer: Medicare Other | Admitting: Anesthesiology

## 2020-10-31 ENCOUNTER — Encounter (HOSPITAL_BASED_OUTPATIENT_CLINIC_OR_DEPARTMENT_OTHER): Payer: Self-pay | Admitting: Podiatry

## 2020-10-31 ENCOUNTER — Ambulatory Visit (HOSPITAL_BASED_OUTPATIENT_CLINIC_OR_DEPARTMENT_OTHER)
Admission: RE | Admit: 2020-10-31 | Discharge: 2020-10-31 | Disposition: A | Discharge status: Home / Self Care | Payer: Medicare Other | Source: Ambulatory Visit | Attending: Podiatry | Admitting: Podiatry

## 2020-10-31 ENCOUNTER — Encounter (HOSPITAL_BASED_OUTPATIENT_CLINIC_OR_DEPARTMENT_OTHER)
Admission: RE | Disposition: A | Discharge status: Home / Self Care | Payer: Self-pay | Source: Ambulatory Visit | Attending: Podiatry

## 2020-10-31 DIAGNOSIS — E11621 Type 2 diabetes mellitus with foot ulcer: Secondary | ICD-10-CM | POA: Insufficient documentation

## 2020-10-31 DIAGNOSIS — L97423 Non-pressure chronic ulcer of left heel and midfoot with necrosis of muscle: Secondary | ICD-10-CM | POA: Diagnosis not present

## 2020-10-31 DIAGNOSIS — X58XXXA Exposure to other specified factors, initial encounter: Secondary | ICD-10-CM | POA: Diagnosis not present

## 2020-10-31 DIAGNOSIS — S91302A Unspecified open wound, left foot, initial encounter: Secondary | ICD-10-CM | POA: Diagnosis not present

## 2020-10-31 DIAGNOSIS — L97429 Non-pressure chronic ulcer of left heel and midfoot with unspecified severity: Secondary | ICD-10-CM | POA: Diagnosis not present

## 2020-10-31 DIAGNOSIS — T148XXA Other injury of unspecified body region, initial encounter: Secondary | ICD-10-CM | POA: Diagnosis not present

## 2020-10-31 DIAGNOSIS — Z481 Encounter for planned postprocedural wound closure: Secondary | ICD-10-CM | POA: Diagnosis not present

## 2020-10-31 DIAGNOSIS — L02612 Cutaneous abscess of left foot: Secondary | ICD-10-CM

## 2020-10-31 HISTORY — DX: Chronic kidney disease, stage 2 (mild): N18.2

## 2020-10-31 HISTORY — DX: Type 2 diabetes mellitus with foot ulcer: E11.621

## 2020-10-31 HISTORY — PX: WOUND DEBRIDEMENT: SHX247

## 2020-10-31 LAB — GLUCOSE, CAPILLARY: Glucose-Capillary: 97 mg/dL (ref 70–99)

## 2020-10-31 LAB — POCT I-STAT, CHEM 8
BUN: 14 mg/dL (ref 6–20)
Calcium, Ion: 1.08 mmol/L — ABNORMAL LOW (ref 1.15–1.40)
Chloride: 101 mmol/L (ref 98–111)
Creatinine, Ser: 0.8 mg/dL (ref 0.61–1.24)
Glucose, Bld: 100 mg/dL — ABNORMAL HIGH (ref 70–99)
HCT: 38 % — ABNORMAL LOW (ref 39.0–52.0)
Hemoglobin: 12.9 g/dL — ABNORMAL LOW (ref 13.0–17.0)
Potassium: 3.8 mmol/L (ref 3.5–5.1)
Sodium: 136 mmol/L (ref 135–145)
TCO2: 26 mmol/L (ref 22–32)

## 2020-10-31 SURGERY — DEBRIDEMENT, WOUND
Anesthesia: General | Site: Foot | Laterality: Left

## 2020-10-31 MED ORDER — HYDROMORPHONE HCL 1 MG/ML IJ SOLN
0.2500 mg | INTRAMUSCULAR | Status: DC | PRN
  Administered 2020-10-31 (×2): 0.5 mg via INTRAVENOUS

## 2020-10-31 MED ORDER — ACETAMINOPHEN 500 MG PO TABS
ORAL_TABLET | ORAL | Status: AC
  Filled 2020-10-31: qty 2

## 2020-10-31 MED ORDER — PROPOFOL 10 MG/ML IV BOLUS
INTRAVENOUS | Status: AC
  Filled 2020-10-31: qty 20

## 2020-10-31 MED ORDER — CEFAZOLIN SODIUM-DEXTROSE 2-4 GM/100ML-% IV SOLN
INTRAVENOUS | Status: AC
  Filled 2020-10-31: qty 100

## 2020-10-31 MED ORDER — PROMETHAZINE HCL 25 MG/ML IJ SOLN: 6.2500 mg | INTRAMUSCULAR | Status: DC | PRN

## 2020-10-31 MED ORDER — KETAMINE HCL 10 MG/ML IJ SOLN
INTRAMUSCULAR | Status: AC
  Filled 2020-10-31: qty 1

## 2020-10-31 MED ORDER — LACTATED RINGERS IV SOLN: INTRAVENOUS | Status: DC

## 2020-10-31 MED ORDER — MIDAZOLAM HCL 2 MG/2ML IJ SOLN
INTRAMUSCULAR | Status: AC
  Filled 2020-10-31: qty 2

## 2020-10-31 MED ORDER — BUPIVACAINE HCL (PF) 0.5 % IJ SOLN
INTRAMUSCULAR | Status: DC | PRN
  Administered 2020-10-31: 20 mL

## 2020-10-31 MED ORDER — DEXMEDETOMIDINE (PRECEDEX) IN NS 20 MCG/5ML (4 MCG/ML) IV SYRINGE
PREFILLED_SYRINGE | INTRAVENOUS | Status: AC
  Filled 2020-10-31: qty 5

## 2020-10-31 MED ORDER — MIDAZOLAM HCL 5 MG/5ML IJ SOLN
INTRAMUSCULAR | Status: DC | PRN
  Administered 2020-10-31 (×2): 1 mg via INTRAVENOUS

## 2020-10-31 MED ORDER — OXYCODONE HCL 5 MG/5ML PO SOLN: 5.0000 mg | Freq: Once | ORAL | Status: DC | PRN

## 2020-10-31 MED ORDER — ONDANSETRON HCL 4 MG/2ML IJ SOLN
INTRAMUSCULAR | Status: DC | PRN
  Administered 2020-10-31: 4 mg via INTRAVENOUS

## 2020-10-31 MED ORDER — FENTANYL CITRATE (PF) 100 MCG/2ML IJ SOLN
INTRAMUSCULAR | Status: DC | PRN
  Administered 2020-10-31 (×2): 50 ug via INTRAVENOUS

## 2020-10-31 MED ORDER — ONDANSETRON HCL 4 MG/2ML IJ SOLN
INTRAMUSCULAR | Status: AC
  Filled 2020-10-31: qty 2

## 2020-10-31 MED ORDER — HYDROMORPHONE HCL 1 MG/ML IJ SOLN
INTRAMUSCULAR | Status: AC
  Filled 2020-10-31: qty 1

## 2020-10-31 MED ORDER — KETAMINE HCL 10 MG/ML IJ SOLN
INTRAMUSCULAR | Status: DC | PRN
  Administered 2020-10-31 (×2): 20 mg via INTRAVENOUS

## 2020-10-31 MED ORDER — SODIUM CHLORIDE 0.9 % IR SOLN
Status: DC | PRN
  Administered 2020-10-31: 200 mL

## 2020-10-31 MED ORDER — ACETAMINOPHEN 500 MG PO TABS
1000.0000 mg | ORAL_TABLET | Freq: Once | ORAL | Status: AC
  Administered 2020-10-31: 1000 mg via ORAL

## 2020-10-31 MED ORDER — OXYCODONE-ACETAMINOPHEN 10-325 MG PO TABS: 1.0000 | ORAL_TABLET | ORAL | 0 refills | Status: DC | PRN

## 2020-10-31 MED ORDER — OXYCODONE HCL 5 MG PO TABS: 5.0000 mg | ORAL_TABLET | Freq: Once | ORAL | Status: DC | PRN

## 2020-10-31 MED ORDER — FENTANYL CITRATE (PF) 100 MCG/2ML IJ SOLN
INTRAMUSCULAR | Status: AC
  Filled 2020-10-31: qty 2

## 2020-10-31 MED ORDER — VANCOMYCIN HCL 1 G IV SOLR: INTRAVENOUS | Status: DC | PRN

## 2020-10-31 MED ORDER — CEFAZOLIN SODIUM-DEXTROSE 2-4 GM/100ML-% IV SOLN
2.0000 g | INTRAVENOUS | Status: AC
  Administered 2020-10-31: 2 g via INTRAVENOUS

## 2020-10-31 MED ORDER — EPHEDRINE SULFATE-NACL 50-0.9 MG/10ML-% IV SOSY
PREFILLED_SYRINGE | INTRAVENOUS | Status: DC | PRN
  Administered 2020-10-31 (×2): 10 mg via INTRAVENOUS

## 2020-10-31 MED ORDER — EPHEDRINE 5 MG/ML INJ
INTRAVENOUS | Status: AC
  Filled 2020-10-31: qty 10

## 2020-10-31 MED ORDER — PROPOFOL 500 MG/50ML IV EMUL
INTRAVENOUS | Status: DC | PRN
  Administered 2020-10-31: 100 ug/kg/min via INTRAVENOUS

## 2020-10-31 MED ORDER — PROPOFOL 500 MG/50ML IV EMUL
INTRAVENOUS | Status: AC
  Filled 2020-10-31: qty 50

## 2020-10-31 SURGICAL SUPPLY — 65 items
BLADE SURG 15 STRL LF DISP TIS (BLADE) ×1 IMPLANT
BLADE SURG 15 STRL SS (BLADE) ×1
BNDG ELASTIC 3X5.8 VLCR STR LF (GAUZE/BANDAGES/DRESSINGS) ×2 IMPLANT
BNDG ELASTIC 4X5.8 VLCR STR LF (GAUZE/BANDAGES/DRESSINGS) ×2 IMPLANT
BNDG ESMARK 4X9 LF (GAUZE/BANDAGES/DRESSINGS) IMPLANT
BNDG GAUZE ELAST 4 BULKY (GAUZE/BANDAGES/DRESSINGS) ×2 IMPLANT
CHLORAPREP W/TINT 26 (MISCELLANEOUS) IMPLANT
CNTNR URN SCR LID CUP LEK RST (MISCELLANEOUS) IMPLANT
CONT SPEC 4OZ STRL OR WHT (MISCELLANEOUS)
COVER BACK TABLE 60X90IN (DRAPES) ×2 IMPLANT
COVER SURGICAL LIGHT HANDLE (MISCELLANEOUS) ×2 IMPLANT
COVER WAND RF STERILE (DRAPES) ×2 IMPLANT
CUFF TOURN SGL QUICK 18X4 (TOURNIQUET CUFF) IMPLANT
CUFF TOURN SGL QUICK 24 (TOURNIQUET CUFF)
CUFF TRNQT CYL 24X4X16.5-23 (TOURNIQUET CUFF) IMPLANT
DRAPE 3/4 80X56 (DRAPES) ×2 IMPLANT
DRAPE EXTREMITY T 121X128X90 (DISPOSABLE) ×2 IMPLANT
DRAPE SHEET LG 3/4 BI-LAMINATE (DRAPES) ×2 IMPLANT
DRAPE U-SHAPE 47X51 STRL (DRAPES) IMPLANT
ELECT REM PT RETURN 9FT ADLT (ELECTROSURGICAL) ×2
ELECTRODE REM PT RTRN 9FT ADLT (ELECTROSURGICAL) ×1 IMPLANT
GAUZE SPONGE 4X4 12PLY STRL (GAUZE/BANDAGES/DRESSINGS) ×2 IMPLANT
GAUZE SPONGE 4X4 12PLY STRL LF (GAUZE/BANDAGES/DRESSINGS) ×2 IMPLANT
GAUZE XEROFORM 1X8 LF (GAUZE/BANDAGES/DRESSINGS) IMPLANT
GLOVE BIO SURGEON STRL SZ7.5 (GLOVE) ×2 IMPLANT
GLOVE BIOGEL PI IND STRL 8 (GLOVE) ×1 IMPLANT
GLOVE BIOGEL PI INDICATOR 8 (GLOVE) ×1
GOWN STRL REUS W/ TWL XL LVL3 (GOWN DISPOSABLE) ×1 IMPLANT
GOWN STRL REUS W/TWL XL LVL3 (GOWN DISPOSABLE) ×1
KIT TURNOVER CYSTO (KITS) ×2 IMPLANT
MANIFOLD NEPTUNE II (INSTRUMENTS) ×2 IMPLANT
NEEDLE HYPO 22GX1.5 SAFETY (NEEDLE) ×2 IMPLANT
NEEDLE HYPO 25X1 1.5 SAFETY (NEEDLE) ×2 IMPLANT
NS IRRIG 1000ML POUR BTL (IV SOLUTION) IMPLANT
PACK BASIN DAY SURGERY FS (CUSTOM PROCEDURE TRAY) ×2 IMPLANT
PAD CAST 4YDX4 CTTN HI CHSV (CAST SUPPLIES) ×1 IMPLANT
PADDING CAST ABS 4INX4YD NS (CAST SUPPLIES) ×1
PADDING CAST ABS COTTON 4X4 ST (CAST SUPPLIES) ×1 IMPLANT
PADDING CAST COTTON 4X4 STRL (CAST SUPPLIES) ×1
PENCIL SMOKE EVAC W/HOLSTER (ELECTROSURGICAL) ×2 IMPLANT
SET IRRIG Y TYPE TUR BLADDER L (SET/KITS/TRAYS/PACK) IMPLANT
SPONGE LAP 4X18 RFD (DISPOSABLE) ×2 IMPLANT
STAPLER VISISTAT 35W (STAPLE) ×2 IMPLANT
STOCKINETTE 6  STRL (DRAPES) ×1
STOCKINETTE 6 STRL (DRAPES) ×1 IMPLANT
SUCTION FRAZIER HANDLE 10FR (MISCELLANEOUS)
SUCTION TUBE FRAZIER 10FR DISP (MISCELLANEOUS) IMPLANT
SUT ETHILON 2 0 PS N (SUTURE) ×2 IMPLANT
SUT ETHILON 3 0 PS 1 (SUTURE) ×2 IMPLANT
SUT ETHILON 4 0 PS 2 18 (SUTURE) IMPLANT
SUT MNCRL AB 3-0 PS2 18 (SUTURE) IMPLANT
SUT MNCRL AB 4-0 PS2 18 (SUTURE) IMPLANT
SUT VIC AB 2-0 SH 27 (SUTURE)
SUT VIC AB 2-0 SH 27XBRD (SUTURE) IMPLANT
SUT VIC AB 3-0 PS1 18 (SUTURE) ×1
SUT VIC AB 3-0 PS1 18X BRD (SUTURE) ×1 IMPLANT
SWAB COLLECTION DEVICE MRSA (MISCELLANEOUS) ×2 IMPLANT
SWAB CULTURE ESWAB REG 1ML (MISCELLANEOUS) ×2 IMPLANT
SYR BULB EAR ULCER 3OZ GRN STR (SYRINGE) ×2 IMPLANT
SYR CONTROL 10ML LL (SYRINGE) ×4 IMPLANT
TIP PROBE CYLINDRICAL MISONIX (TIP) IMPLANT
TRAY DSU PREP LF (CUSTOM PROCEDURE TRAY) ×2 IMPLANT
TUBE IRRIGATION SET MISONIX (TUBING) ×2 IMPLANT
UNDERPAD 30X36 HEAVY ABSORB (UNDERPADS AND DIAPERS) ×2 IMPLANT
YANKAUER SUCT BULB TIP NO VENT (SUCTIONS) ×2 IMPLANT

## 2020-10-31 NOTE — Op Note (Signed)
Patient Name: Charles Thompson DOB: 03-13-1961  MRN: [id number]   Date of Service: 10/31/20    Surgeon: Dr. Hardie Pulley, DPM Assistants: None Pre-operative Diagnosis: Ulcer left heel, open wound left midfoot with encounter for delayed closure Post-operative Diagnosis: same Procedures:             1) Delayed closure of midfoot wound  2) Wound debridement left heel sub-fascial; 21cm debrided. Pathology/Specimens: ID Type Source Tests Collected by Time Destination  A : Wound culture left foot Wound Wound AEROBIC/ANAEROBIC CULTURE (SURGICAL/DEEP WOUND), FUNGUS STAIN Evelina Bucy, DPM 10/31/2020 0751    Anesthesia: MAC Hemostasis: Anatomic Estimated Blood Loss: 25 ml Materials: None Medications: none Complications: None  Indications for Procedure:  This is a 59 y.o. male with wounds to the left foot. He presents for debridement to promote healing.   Procedure in Detail: Patient was identified in pre-operative holding area. Formal consent was signed and the left lower extremity was marked. Patient was brought back to the operating room and remained on his stretcher in the supine position. Anesthesia was induced.   The extremity was prepped and draped in the usual sterile fashion. Timeout was taken to confirm patient name, laterality, and procedure prior to incision. Attention was then directed to the left foot where a linear midfoot wound, as well as a large plantar heel wound were noted.  The wound was sharply excisionally debrided with a 15 blade and rongeur, followed by a misonix ultrasonic debrider. Debridement was performed to bleeding, viable wound base. The wound was debrided to the level of the subfascial tissue. Following debridement the wound measured 7x3 cm posteriorly, 4x0.5 cm at the midfoot.  Delayed closure of the midfoot wound was performed. Following thorough debridement as above, the skin edges were undermined. The wound was closed in layers with 3-0 vicryl, 3-0  nylon, and skin staples.  The posterior wound was loosely approximated with 3-0 and 2-0 nylon retention sutures to decrease the wound size. Following this the wound measured 7x1.5.  The foot was then dressed with betadine, 4x4, kerlix, and ACE bandage. Patient tolerated the procedure well.   Disposition: Following a period of post-operative monitoring, patient will be transferred back home.

## 2020-10-31 NOTE — Transfer of Care (Signed)
Immediate Anesthesia Transfer of Care Note  Patient: Charles Thompson  Procedure(s) Performed: DEBRIDEMENT WOUNDS LEFT FOOT WITH  CLOSURE OF MIDFOOT WOUND (Left Foot)  Patient Location: PACU  Anesthesia Type:MAC  Level of Consciousness: awake, alert , oriented and patient cooperative  Airway & Oxygen Therapy: Patient Spontanous Breathing and Patient connected to face mask oxygen  Post-op Assessment: Report given to RN and Post -op Vital signs reviewed and stable  Post vital signs: Reviewed and stable  Last Vitals:  Vitals Value Taken Time  BP 121/64 10/31/20 0815  Temp    Pulse 80 10/31/20 0816  Resp 20 10/31/20 0816  SpO2 97 % 10/31/20 0816  Vitals shown include unvalidated device data.  Last Pain:  Vitals:   10/31/20 5825  TempSrc: Oral  PainSc: 0-No pain      Patients Stated Pain Goal: 3 (18/98/42 1031)  Complications: No complications documented.

## 2020-10-31 NOTE — Interval H&P Note (Signed)
History and Physical Interval Note:  10/31/2020 6:59 AM  Charles Thompson  has presented today for surgery, with the diagnosis of Ulcer left heel.  The various methods of treatment have been discussed with the patient and family. After consideration of risks, benefits and other options for treatment, the patient has consented to  Procedure(s): DEBRIDEMENT WOUNDS LEFT FOOT WITH POSSIBLE CLOSURE OF MIDFOOT WOUND (Left) as a surgical intervention.  The patient's history has been reviewed, patient examined, no change in status, stable for surgery.  I have reviewed the patient's chart and labs.  Questions were answered to the patient's satisfaction.     Park Liter

## 2020-10-31 NOTE — Anesthesia Postprocedure Evaluation (Addendum)
Anesthesia Post Note  Patient: Charles Thompson  Procedure(s) Performed: DEBRIDEMENT WOUNDS LEFT FOOT WITH  CLOSURE OF MIDFOOT WOUND (Left Foot)     Patient location during evaluation: PACU Anesthesia Type: MAC Level of consciousness: awake and alert Pain management: pain level controlled Vital Signs Assessment: post-procedure vital signs reviewed and stable Respiratory status: spontaneous breathing, nonlabored ventilation and respiratory function stable Cardiovascular status: blood pressure returned to baseline and stable Postop Assessment: no apparent nausea or vomiting Anesthetic complications: no   No complications documented.  Last Vitals:  Vitals:   10/31/20 0830 10/31/20 0845  BP: 101/84 124/61  Pulse: 66 (!) 57  Resp: 11 13  Temp:    SpO2: 100% 96%    Last Pain:  Vitals:   10/31/20 0845  TempSrc:   PainSc: [place]

## 2020-10-31 NOTE — Discharge Instructions (Signed)
  After Surgery Instructions   1) If you are recuperating from surgery anywhere other than home, please be sure to leave us the number where you can be reached.  2) Go directly home and rest.  3) Keep the operated foot(feet) elevated six inches above the hip when sitting or lying down. This will help control swelling and pain.  4) Support the elevated foot and leg with pillows. DO NOT PLACE PILLOWS UNDER THE KNEE.  5) DO NOT REMOVE or get your bandages WET, unless you were given different instructions by your doctor to do so. This increases the risk of infection.  6) Wear your surgical shoe or surgical boot at all times when you are up on your feet.  7) A limited amount of pain and swelling may occur. The skin may take on a bruised appearance. DO NOT BE ALARMED, THIS IS NORMAL.  8) For slight pain and swelling, apply an ice pack directly over the bandages for 15 minutes only out of each hour of the day. Continue until seen in the office for your first post op visit. DO NOT APPLY ANY FORM OF HEAT TO THE AREA.  9) Have prescriptions filled immediately and take as directed.  10) Drink lots of liquids, water and juice to stay hydrated.  11) CALL IMMEDIATELY IF:  *Bleeding continues until the following day of surgery  *Pain increases and/or does not respond to medication  *Bandages or cast appears to tight  *If your bandage gets wet  *Trip, fall or stump your surgical foot  *If your temperature goes above 101  *If you have ANY questions at all  12) You are expected to be weightbearing after your surgery.   If you need to reach the nurse for any reason, please call: [place]: [phone] : [phone] [place]: [phone]   Post Anesthesia Home Care Instructions  Activity: Get plenty of rest for the remainder of the day. A responsible adult should stay with you for 24 hours following the procedure.  For the next 24 hours, DO NOT: -Drive a  car -Operate machinery -Drink alcoholic beverages -Take any medication unless instructed by your physician -Make any legal decisions or sign important papers.  Meals: Start with liquid foods such as gelatin or soup. Progress to regular foods as tolerated. Avoid greasy, spicy, heavy foods. If nausea and/or vomiting occur, drink only clear liquids until the nausea and/or vomiting subsides. Call your physician if vomiting continues.  Special Instructions/Symptoms: Your throat may feel dry or sore from the anesthesia or the breathing tube placed in your throat during surgery. If this causes discomfort, gargle with warm salt water. The discomfort should disappear within 24 hours.  If you had a scopolamine patch placed behind your ear for the management of post- operative nausea and/or vomiting:  1. The medication in the patch is effective for 72 hours, after which it should be removed.  Wrap patch in a tissue and discard in the trash. Wash hands thoroughly with soap and water. 2. You may remove the patch earlier than 72 hours if you experience unpleasant side effects which may include dry mouth, dizziness or visual disturbances. 3. Avoid touching the patch. Wash your hands with soap and water after contact with the patch.    

## 2020-11-01 ENCOUNTER — Encounter (HOSPITAL_BASED_OUTPATIENT_CLINIC_OR_DEPARTMENT_OTHER): Payer: Self-pay | Admitting: Podiatry

## 2020-11-01 ENCOUNTER — Ambulatory Visit: Payer: Medicare Other | Admitting: Podiatry

## 2020-11-04 LAB — FUNGUS STAIN

## 2020-11-04 LAB — FUNGAL STAIN REFLEX

## 2020-11-05 LAB — AEROBIC/ANAEROBIC CULTURE W GRAM STAIN (SURGICAL/DEEP WOUND): Culture: NO GROWTH

## 2020-11-07 ENCOUNTER — Other Ambulatory Visit: Payer: Self-pay

## 2020-11-07 ENCOUNTER — Ambulatory Visit (INDEPENDENT_AMBULATORY_CARE_PROVIDER_SITE_OTHER): Payer: Medicare Other | Admitting: Podiatry

## 2020-11-07 ENCOUNTER — Ambulatory Visit (INDEPENDENT_AMBULATORY_CARE_PROVIDER_SITE_OTHER): Payer: Medicare Other

## 2020-11-07 DIAGNOSIS — L03116 Cellulitis of left lower limb: Secondary | ICD-10-CM

## 2020-11-07 DIAGNOSIS — E11628 Type 2 diabetes mellitus with other skin complications: Secondary | ICD-10-CM

## 2020-11-07 DIAGNOSIS — L089 Local infection of the skin and subcutaneous tissue, unspecified: Secondary | ICD-10-CM

## 2020-11-07 MED ORDER — SANTYL 250 UNIT/GM EX OINT: 1.0000 "application " | TOPICAL_OINTMENT | Freq: Every day | CUTANEOUS | 2 refills | Status: DC

## 2020-11-12 ENCOUNTER — Other Ambulatory Visit: Payer: Self-pay

## 2020-11-12 ENCOUNTER — Ambulatory Visit (INDEPENDENT_AMBULATORY_CARE_PROVIDER_SITE_OTHER): Payer: Medicare Other | Admitting: Podiatry

## 2020-11-12 DIAGNOSIS — Z5329 Procedure and treatment not carried out because of patient's decision for other reasons: Secondary | ICD-10-CM

## 2020-11-12 NOTE — Progress Notes (Signed)
No show for post-op appointment. Will have staff contact patient to re-schedule as it is important he follow up to prevent worsening. Chart routed to clinic scheduler.

## 2020-11-13 NOTE — Progress Notes (Signed)
Subjective: 59 year old male with complex surgical history recently of his left foot, heel recently underwent surgery with Dr. Samuella Cota on October 31, 2020 delayed closure of midfoot wound, wound debridement left heel subfascial.  He presents today for follow-up evaluation.  He is still on antibiotics.  Also asking to trim calluses on the right foot.  Denies any open lesions on the right side.  He has a follow-up as scheduled with infectious disease.  Patient seems to be in good spirits today he has no concerns.  Denies any systemic complaints such as fevers, chills, nausea, vomiting. No acute changes since last appointment, and no other complaints at this time.   Objective: AAO x3, NAD DP/PT pulses palpable , CRT less than 3 seconds On the right foot there is thick hyperkeratotic lesion submetatarsal 1 as well as the heel to debrided today without any complications and there is no underlying ulceration identified today.   On the left foot there is a large incision present on plantar aspect the foot going towards the heel.  Fibrogranular wound base is present in the heel measuring approximately 5 x 2.5 x 1.5 cm without any probing to bone, or tunneling.  There is also an area of superficial skin breakdown of the distal aspect with a granular wound.  There is no warmth to the foot.  Minimal edema.  There is no fluctuance crepitation but there is no malodor.  No pain with calf compression, swelling, warmth, erythema  Assessment: Left foot status post multiple surgeries  Plan: -All treatment options discussed with the patient including all alternatives, risks, complications.  -X-rays obtained reviewed.  Status post transmetatarsal imitation.  No obvious signs of acute fracture, osteomyelitis or soft tissue emphysema. -The majority of the wound appears to be healing well however concerned about the wound on the bottom of the heel.  I discussed the case with Dr. Samuella Cota and recommended Santyl which I  prescribed today.  Hydrocodone use.  Offered home health care but he states that he can change the bandage himself.  Antibiotic ointment to the distal plantar wound. -Continue antibiotics per infectious disease. -Follow-up next week with Dr. Samuella Cota -NWB -Patient encouraged to call the office with any questions, concerns, change in symptoms.   Vivi Barrack DPM

## 2020-11-14 NOTE — Progress Notes (Signed)
Pt scheduled 1/4 @ 2;45

## 2020-11-18 ENCOUNTER — Ambulatory Visit: Payer: Medicare Other | Admitting: Infectious Disease

## 2020-11-19 ENCOUNTER — Other Ambulatory Visit: Payer: Self-pay

## 2020-11-19 ENCOUNTER — Ambulatory Visit (INDEPENDENT_AMBULATORY_CARE_PROVIDER_SITE_OTHER): Payer: Medicare Other | Admitting: Podiatry

## 2020-11-19 DIAGNOSIS — L089 Local infection of the skin and subcutaneous tissue, unspecified: Secondary | ICD-10-CM

## 2020-11-19 DIAGNOSIS — L03116 Cellulitis of left lower limb: Secondary | ICD-10-CM

## 2020-11-19 DIAGNOSIS — E11628 Type 2 diabetes mellitus with other skin complications: Secondary | ICD-10-CM

## 2020-11-26 ENCOUNTER — Encounter: Payer: Medicare Other | Admitting: Podiatry

## 2020-11-27 ENCOUNTER — Telehealth: Payer: Self-pay | Admitting: *Deleted

## 2020-11-27 NOTE — Telephone Encounter (Signed)
Patient is requesting a refill of the Onycodone- acetaminophen-10-325mg  tablets,only has 2 pills remaining and his foot is still painful at times.

## 2020-11-28 ENCOUNTER — Telehealth: Payer: Self-pay | Admitting: *Deleted

## 2020-11-28 MED ORDER — OXYCODONE-ACETAMINOPHEN 10-325 MG PO TABS: 1.0000 | ORAL_TABLET | ORAL | 0 refills | Status: DC | PRN

## 2020-11-28 NOTE — Telephone Encounter (Signed)
Called and left VM message per Dr Samuella Cota that prescription has been sent to pharmacy on file.

## 2020-12-03 ENCOUNTER — Ambulatory Visit (INDEPENDENT_AMBULATORY_CARE_PROVIDER_SITE_OTHER): Payer: Medicare Other | Admitting: Podiatry

## 2020-12-03 ENCOUNTER — Other Ambulatory Visit: Payer: Self-pay

## 2020-12-03 ENCOUNTER — Telehealth (HOSPITAL_COMMUNITY): Payer: Medicare Other | Admitting: Psychiatry

## 2020-12-03 DIAGNOSIS — L97422 Non-pressure chronic ulcer of left heel and midfoot with fat layer exposed: Secondary | ICD-10-CM | POA: Diagnosis not present

## 2020-12-03 DIAGNOSIS — E08621 Diabetes mellitus due to underlying condition with foot ulcer: Secondary | ICD-10-CM

## 2020-12-03 NOTE — Progress Notes (Signed)
  Subjective:  Patient ID: Charles Thompson, male    DOB: 1961/07/26,  MRN: [id number]  No chief complaint on file.  60 y.o. male presents for wound care. Denies new complaints, thinks the wound is doing better. Denies N/V/F/Ch. Taking abx as directed. Objective:  Physical Exam: Wound Location: left heel Wound Measurement: 4x2, 5x4 Wound Base: Fibrotic slough Necrotic eschar Peri-wound: Macerated Exudate: Moderate amount Serosanguinous exudate There is no exposed bone to either wound of the heel and no probe to bone noted.  Incision at medial aspect of the foot well appearing without active cellulitis. Assessment:   1. Cellulitis of left foot   2. Diabetic foot infection (HCC)    Plan:  Patient was evaluated and treated and all questions answered.  Ulcer left heel -Wounds minimally debrided today, overall improving without signs of infection noted. -Recommended Santyl for enzymatic debridement, patient states he will not use 2/2 cost. -Continue boot immobilization.  Return in about 2 weeks (around 12/03/2020) for Wound Care.

## 2020-12-03 NOTE — Progress Notes (Signed)
  Subjective:  Patient ID: Charles Thompson, male    DOB: 07/07/1961,  MRN: [id number]  Chief Complaint  Patient presents with  . Routine Post Op    POV#3 Pt denies fever/nausea/vomiting/chills. Pt states there is an odor with his wound and some drainage. No dressing noted today.   59 y.o. male presents for wound care. Noted a little odor today - continued drainage. Has been using iodosorb but does not like to keep it dressed because it feels tight. Objective:  Physical Exam: Wound Location: left heel, distal foot Wound Measurement: 1.3x3.5 proximal, 2.5x2.5 distal Wound Base: Fibrotic slough, eschar Peri-wound: hyperkeratotic, dry Exudate: mild amount Serosanguinous exudate No exposed bone, no probe to bone. Proximal wound with malodor  Medial arch wound healing well with intact staple material.  Assessment:   1. Diabetic ulcer of left heel associated with diabetes mellitus due to underlying condition, with fat layer exposed (HCC)    Plan:  Patient was evaluated and treated and all questions answered.  Ulcer left heel -Wounds debrided today, overall improving without signs of infection noted. -Again recommended Santyl but patient will not get 2/2 cost -F/u in 1 week. New surveillance XR at that time. Should the wound be worsened would consider repeat debridement.  Procedure: Selective Debridement of Wound Rationale: Removal of devitalized tissue from the wound to promote healing.  Pre-Debridement Wound Measurements: 1.3x3.5 cm, 2.5x2 cm Post-Debridement Wound Measurements: same as pre-debridement. Type of Debridement: sharp selective Tissue Removed: Devitalized soft-tissue Dressing: Dry, sterile, compression dressing. Disposition: Patient tolerated procedure well. Patient to return in 1 week for follow-up.   Return in about 1 week (around 12/10/2020) for Wound Care, with XRs.

## 2020-12-03 NOTE — Patient Instructions (Signed)
Betadine Soak Instructions  THE DAY AFTER THE PROCEDURE  Place 1 tablespoon of betadine solution in a quart of warm tap water.  Submerge your foot and continue to soak in the solution for 20 minutes.  This soak should be done every other day.  Next, remove your foot or feet from solution, blot dry the affected area and cover with iodosorb and dressing.

## 2020-12-10 ENCOUNTER — Other Ambulatory Visit: Payer: Self-pay | Admitting: Podiatry

## 2020-12-10 ENCOUNTER — Ambulatory Visit: Payer: Self-pay | Admitting: Podiatry

## 2020-12-10 ENCOUNTER — Telehealth: Payer: Self-pay

## 2020-12-10 ENCOUNTER — Ambulatory Visit (INDEPENDENT_AMBULATORY_CARE_PROVIDER_SITE_OTHER): Payer: Medicare Other

## 2020-12-10 ENCOUNTER — Ambulatory Visit (INDEPENDENT_AMBULATORY_CARE_PROVIDER_SITE_OTHER): Payer: Medicare Other | Admitting: Podiatry

## 2020-12-10 ENCOUNTER — Other Ambulatory Visit: Payer: Self-pay

## 2020-12-10 DIAGNOSIS — E11621 Type 2 diabetes mellitus with foot ulcer: Secondary | ICD-10-CM

## 2020-12-10 DIAGNOSIS — E08621 Diabetes mellitus due to underlying condition with foot ulcer: Secondary | ICD-10-CM | POA: Diagnosis not present

## 2020-12-10 DIAGNOSIS — L97422 Non-pressure chronic ulcer of left heel and midfoot with fat layer exposed: Secondary | ICD-10-CM

## 2020-12-10 MED ORDER — SANTYL 250 UNIT/GM EX OINT: 1.0000 "application " | TOPICAL_OINTMENT | Freq: Every day | CUTANEOUS | 2 refills | Status: DC

## 2020-12-10 NOTE — Progress Notes (Signed)
  Subjective:  Patient ID: Charles Thompson, male    DOB: Sep 16, 1961,  MRN: [id number]  Chief Complaint  Patient presents with  . Wound Check    F/U Lt heel ulcer check -pt states," seems worse or the same." - w/ redness,swelling and bloody drainage -w/ odor per pt -pt denies N/V/F/Ch Tx: iodosorb dressing daily, elevation, boot and icing   60 y.o. male presents for wound care. Hx confirmed with patient. Concerned about odor to the foot  Denies N/V/F/Ch. States that he has had altered appetite which is normal for him. Objective:  Physical Exam: Wound Location: left forefoot, heel Wound Measurement: 2x3.5 distally, 1.5x5.5 proximally Wound Base: Necrotic eschar; small bit of orange peel noted in the wound bed. Peri-wound: Calloused Exudate: None: wound tissue dry wounds without warmth, erythema, signs of acute infection. No ascending cellulitis. No probe to bone. No deep tunneling. Malodor noted about the necrotic areas, reduced upon debridement. Suture material and staple material present along wound margins.      Radiographs:  X-ray of the left foot: No subcutaneous emphysema noted. No fractures noted. No periosteal reaction or definite osseous erosion noted to the calcaneus. Midfoot amputation noted with complete removal of the 1st metatarsal. Stable HO formation distal 2nd met without osseous erosion. Assessment:   1. Diabetic ulcer of left heel associated with diabetes mellitus due to underlying condition, with fat layer exposed ([facility])   2. Cellulitis of left foot   3. Abscess of left heel    Plan:  Patient was evaluated and treated and all questions answered.  Ulcer left heel, midfoot  -Repeat XRs taken. No definite osseous erosions, no periosteal reactions. -Overall wound is similar to previous without signs of acute infection. The wound bases are more necrotic today. This was sharply debrided with a pickup blade and scissor. Patient tolerated well. -Majority of staples  removed today. -Patient to soak 3x weekly in dilute betadine solution. Instructions given. -Re-ordered santyl. We did discuss the benefit of this medication for enzymatic debridement. Patient stated he would get it (patient previously resisted). Wound instructions given. -Continue Abx per ID -Check CBC, BMP pre-operatively. -Superficial wound culture taken of the heel. No purulence or active drainage to culture today. -Continue boot for immobilization. -Would benefit from outpatient debridement to promote healing. All risks, benefits, and alternatives discussed with patient. No guarantees given. Consent reviewed and signed by patient. -Planned procedures: left forefoot and heel wound debridement and irrigation, possible skin graft substitute application -Identified risk factors: DM with Neuropathy  Procedure: Selective Debridement of Wound Rationale: Removal of devitalized tissue from the wound to promote healing.  Pre-Debridement Wound Measurements: 1 cm x 5.5 cm x 1 cm  Post-Debridement Wound Measurements: same as pre-debridement. Type of Debridement: sharp selective with scissor and pickup Tissue Removed: Devitalized soft-tissue Dressing: Dry, sterile, compression dressing. Disposition: Patient tolerated procedure well. Patient to return in 1 week for follow-up.   No follow-ups on file.

## 2020-12-10 NOTE — Patient Instructions (Addendum)
Cleanse wound daily and dress with iodosorb or santyl (new prescription sent today).   Soak foot every other day with betadine: Place 1 tablespoon of betadine solution in a quart of warm tap water.  Submerge your foot or feet with outer bandage intact for the initial soak; this will allow the bandage to become moist and wet for easy lift off.  Once you remove your bandage, continue to soak in the solution for 20 minutes.  This soak should be done twice a day.  Next, remove your foot or feet from solution, blot dry the affected area and dress with Iodosorb and bandage.  When you receive the Santyl, the santyl is applied nickel thick to the wound bed, followed by a saline moist gauze, dry gauze, and wrap. This is to be changed daily.

## 2020-12-10 NOTE — H&P (View-Only) (Signed)
  Subjective:  Patient ID: Charles Thompson, male    DOB: 10/03/1961,  MRN: [id number]  Chief Complaint  Patient presents with  . Wound Check    F/U Lt heel ulcer check -pt states," seems worse or the same." - w/ redness,swelling and bloody drainage -w/ odor per pt -pt denies N/V/F/Ch Tx: iodosorb dressing daily, elevation, boot and icing   60 y.o. male presents for wound care. Hx confirmed with patient. Concerned about odor to the foot  Denies N/V/F/Ch. States that he has had altered appetite which is normal for him. Objective:  Physical Exam: Wound Location: left forefoot, heel Wound Measurement: 2x3.5 distally, 1.5x5.5 proximally Wound Base: Necrotic eschar; small bit of orange peel noted in the wound bed. Peri-wound: Calloused Exudate: None: wound tissue dry wounds without warmth, erythema, signs of acute infection. No ascending cellulitis. No probe to bone. No deep tunneling. Malodor noted about the necrotic areas, reduced upon debridement. Suture material and staple material present along wound margins.      Radiographs:  X-ray of the left foot: No subcutaneous emphysema noted. No fractures noted. No periosteal reaction or definite osseous erosion noted to the calcaneus. Midfoot amputation noted with complete removal of the 1st metatarsal. Stable HO formation distal 2nd met without osseous erosion. Assessment:   1. Diabetic ulcer of left heel associated with diabetes mellitus due to underlying condition, with fat layer exposed (HCC)   2. Cellulitis of left foot   3. Abscess of left heel    Plan:  Patient was evaluated and treated and all questions answered.  Ulcer left heel, midfoot  -Repeat XRs taken. No definite osseous erosions, no periosteal reactions. -Overall wound is similar to previous without signs of acute infection. The wound bases are more necrotic today. This was sharply debrided with a pickup blade and scissor. Patient tolerated well. -Majority of staples  removed today. -Patient to soak 3x weekly in dilute betadine solution. Instructions given. -Re-ordered santyl. We did discuss the benefit of this medication for enzymatic debridement. Patient stated he would get it (patient previously resisted). Wound instructions given. -Continue Abx per ID -Check CBC, BMP pre-operatively. -Superficial wound culture taken of the heel. No purulence or active drainage to culture today. -Continue boot for immobilization. -Would benefit from outpatient debridement to promote healing. All risks, benefits, and alternatives discussed with patient. No guarantees given. Consent reviewed and signed by patient. -Planned procedures: left forefoot and heel wound debridement and irrigation, possible skin graft substitute application -Identified risk factors: DM with Neuropathy  Procedure: Selective Debridement of Wound Rationale: Removal of devitalized tissue from the wound to promote healing.  Pre-Debridement Wound Measurements: 1 cm x 5.5 cm x 1 cm  Post-Debridement Wound Measurements: same as pre-debridement. Type of Debridement: sharp selective with scissor and pickup Tissue Removed: Devitalized soft-tissue Dressing: Dry, sterile, compression dressing. Disposition: Patient tolerated procedure well. Patient to return in 1 week for follow-up.   No follow-ups on file.   

## 2020-12-10 NOTE — Telephone Encounter (Signed)
DOS 12/18/2020  DEBRIDEMENT RIGHT FOOT- 11043 APPLICATION SKIN GRAFT - [id number]  [employer] MEDICARE EFFECTIVE DATE - 11/16/2020  PLAN DEDUCTIBLE - $0.00 OUT OF POCKET - $6700.00 W/ $0388.82 REMAINING  CO-INSURANCE 0% / Day OUTPATIENT SURGERY 0% / Day OUTPATIENT HOSPITAL COPAY $395 / Day OUTPATIENT SURGERY $395 / Day OUTPATIENT HOSPITA  Notification or Prior Authorization is not required for the requested services  Decision ID #:[id number]

## 2020-12-11 ENCOUNTER — Telehealth: Payer: Self-pay | Admitting: *Deleted

## 2020-12-11 NOTE — Telephone Encounter (Signed)
Great - thanks

## 2020-12-11 NOTE — Telephone Encounter (Signed)
[facility] is wanting to confirm the Santyl prescription quantity and day supply.please call  Returned call and gave medication detail per notes in Epic:90 each w/ 2 refills,verbalized understanding.

## 2020-12-12 NOTE — Progress Notes (Addendum)
COVID Vaccine Completed: x2 Date COVID Vaccine completed: 02-15-20 & 03-07-20 COVID vaccine manufacturer: Pfizer     PCP - Guerry Bruin, MD Cardiologist - N/A  Requested H&P from Dr. Kandice Hams office 12-13-20 with  Burnett Harry.  Chest x-ray - N/A EKG - 10-19-20 in Epic Stress Test -  ECHO -  Cardiac Cath -  Pacemaker/ICD device last checked:  Sleep Study - N/A CPAP -   Fasting Blood Sugar - 118 to 150 Checks Blood Sugar  - 1 time a day  Blood Thinner Instructions: Aspirin Instructions: ASA 81  Last Dose:  Anesthesia review:   Patient denies shortness of breath, fever, cough and chest pain at PAT appointment.  Pt unable to climb a flight of stairs due to foot.  Is able to do housework, lives alone.   Patient verbalized understanding of instructions that were given to them at the PAT appointment. Patient was also instructed that they will need to review over the PAT instructions again at home before surgery.

## 2020-12-12 NOTE — Patient Instructions (Addendum)
DUE TO COVID-19 ONLY ONE VISITOR IS ALLOWED TO COME WITH YOU AND STAY IN THE WAITING ROOM ONLY DURING PRE OP AND PROCEDURE.   IF YOU WILL BE ADMITTED INTO THE HOSPITAL YOU ARE ALLOWED ONE SUPPORT PERSON DURING VISITATION HOURS ONLY (10AM -8PM)    The support person may change daily.  The support person must pass our screening, gel in and out, and wear a mask at all times, including in the patients room.  Patients must also wear a mask when staff or their support person are in the room.   COVID SWAB TESTING MUST BE COMPLETED ON:  Monday, 12-16-20 @ 2:30 PM   [address] [place], [place] [address]  (Must self quarantine after testing. Follow instructions on handout.)        Your procedure is scheduled on:  Wednesday, 12-18-20   Report to [facility] Main  Entrance    Report to admitting at 1:30 PM   Call this number if you have problems the morning of surgery [phone]   Do not eat food :After Midnight.   May have liquids until 12:30 PM  day of surgery  CLEAR LIQUID DIET  Foods Allowed                                                                     Foods Excluded  Water, Black Coffee and tea, regular and decaf             liquids that you cannot  Plain Jell-O in any flavor  (No red)                                 see through such as: Fruit ices (not with fruit pulp)                                     milk, soups, orange juice              Iced Popsicles (No red)                                      All solid food                                   Apple juices Sports drinks like Gatorade (No red) Lightly seasoned clear broth or consume(fat free) Sugar, honey syrup     Complete one G2 drink the morning of surgery at 12:30 PM the day of surgery.       1. The day of surgery:  ? Drink ONE (1) Pre-Surgery Clear Ensure or G2 by am the morning of surgery. Drink in one sitting. Do not sip.  ? This drink was given to you during your hospital  pre-op appointment  visit. ? Nothing else to drink after completing the  Pre-Surgery Clear Ensure or G2.          If you have questions, please contact your surgeons  office.     Oral Hygiene is also important to reduce your risk of infection.                                    Remember - BRUSH YOUR TEETH THE MORNING OF SURGERY WITH YOUR REGULAR TOOTHPASTE   Do NOT smoke after Midnight   Take these medicines the morning of surgery with A SIP OF WATER:  Amlodipine, Omeprazole, Sertraline  How to Manage Your Diabetes Before and After Surgery  Why is it important to control my blood sugar before and after surgery?  Improving blood sugar levels before and after surgery helps healing and can limit problems.  A way of improving blood sugar control is eating a healthy diet by: o  Eating less sugar and carbohydrates o  Increasing activity/exercise o  Talking with your doctor about reaching your blood sugar goals  High blood sugars (greater than 180 mg/dL) can raise your risk of infections and slow your recovery, so you will need to focus on controlling your diabetes during the weeks before surgery.  Make sure that the doctor who takes care of your diabetes knows about your planned surgery including the date and location.  How do I manage my blood sugar before surgery?  Check your blood sugar at least 4 times a day, starting 2 days before surgery, to make sure that the level is not too high or low. o Check your blood sugar the morning of your surgery when you wake up and every 2 hours until you get to the Short Stay unit.  If your blood sugar is less than 70 mg/dL, you will need to treat for low blood sugar: o Do not take insulin. o Treat a low blood sugar (less than 70 mg/dL) with  cup of clear juice (cranberry or apple), 4 glucose tablets, OR glucose gel. o Recheck blood sugar in 15 minutes after treatment (to make sure it is greater than 70 mg/dL). If your blood sugar is not greater than 70 mg/dL on  recheck, call [phone] for further instructions.  Report your blood sugar to the short stay nurse when you get to Short Stay.   If you are admitted to the hospital after surgery: o Your blood sugar will be checked by the staff and you will probably be given insulin after surgery (instead of oral diabetes medicines) to make sure you have good blood sugar levels. o The goal for blood sugar control after surgery is 80-180 mg/dL.   WHAT DO I DO ABOUT MY DIABETES MEDICATION?   Do not take oral diabetes medicines (pills) the morning of surgery.   THE DAY BEFORE SURGERY:  Do not take Synjardy       Tale 100% of Toujeo in the am and 50% 25 u at bedtime        THE MORNING OF SURGERY:   Do not take Synjardy        Take 25 u of Toujeo in the am  Reviewed and Endorsed by [facility] Patient Education Committee, August 2015                               You may not have any metal on your body including jewelry, and body piercings             Do not wear lotions, powders, perfumes/cologne,  or deodorant              Men may shave face and neck.   Do not bring valuables to the hospital. [facility] IS NOT RESPONSIBLE   FOR VALUABLES.   Contacts, dentures or bridgework may not be worn into surgery.    Patients discharged the day of surgery will not be allowed to drive home.   Special Instructions: Bring a copy of your healthcare power of attorney and living will documents         the day of surgery if you haven't scanned them in before.              Please read over the following fact sheets you were given: IF YOU HAVE QUESTIONS ABOUT YOUR PRE OP INSTRUCTIONS PLEASE CALL [phone]   [facility] - Preparing for Surgery Before surgery, you can play an important role.  Because skin is not sterile, your skin needs to be as free of germs as possible.  You can reduce the number of germs on your skin by washing with CHG (chlorahexidine gluconate) soap before surgery.  CHG is an antiseptic  cleaner which kills germs and bonds with the skin to continue killing germs even after washing. Please DO NOT use if you have an allergy to CHG or antibacterial soaps.  If your skin becomes reddened/irritated stop using the CHG and inform your nurse when you arrive at Short Stay. Do not shave (including legs and underarms) for at least 48 hours prior to the first CHG shower.  You may shave your face/neck.  Please follow these instructions carefully:  1.  Shower with CHG Soap the night before surgery and the  morning of surgery.  2.  If you choose to wash your hair, wash your hair first as usual with your normal  shampoo.  3.  After you shampoo, rinse your hair and body thoroughly to remove the shampoo.                             4.  Use CHG as you would any other liquid soap.  You can apply chg directly to the skin and wash.  Gently with a scrungie or clean washcloth.  5.  Apply the CHG Soap to your body ONLY FROM THE NECK DOWN.   Do   not use on face/ open                           Wound or open sores. Avoid contact with eyes, ears mouth and   genitals (private parts).                       Wash face,  Genitals (private parts) with your normal soap.             6.  Wash thoroughly, paying special attention to the area where your    surgery  will be performed.  7.  Thoroughly rinse your body with warm water from the neck down.  8.  DO NOT shower/wash with your normal soap after using and rinsing off the CHG Soap.                9.  Pat yourself dry with a clean towel.            10.  Wear clean pajamas.  11.  Place clean sheets on your bed the night of your first shower and do not  sleep with pets. Day of Surgery : Do not apply any lotions/deodorants the morning of surgery.  Please wear clean clothes to the hospital/surgery center.  FAILURE TO FOLLOW THESE INSTRUCTIONS MAY RESULT IN THE CANCELLATION OF YOUR SURGERY  PATIENT SIGNATURE_________________________________  NURSE  SIGNATURE__________________________________  ________________________________________________________________________   Rogelia Mire  An incentive spirometer is a tool that can help keep your lungs clear and active. This tool measures how well you are filling your lungs with each breath. Taking long deep breaths may help reverse or decrease the chance of developing breathing (pulmonary) problems (especially infection) following:  A long period of time when you are unable to move or be active. BEFORE THE PROCEDURE   If the spirometer includes an indicator to show your best effort, your nurse or respiratory therapist will set it to a desired goal.  If possible, sit up straight or lean slightly forward. Try not to slouch.  Hold the incentive spirometer in an upright position. INSTRUCTIONS FOR USE  1. Sit on the edge of your bed if possible, or sit up as far as you can in bed or on a chair. 2. Hold the incentive spirometer in an upright position. 3. Breathe out normally. 4. Place the mouthpiece in your mouth and seal your lips tightly around it. 5. Breathe in slowly and as deeply as possible, raising the piston or the ball toward the top of the column. 6. Hold your breath for 3-5 seconds or for as long as possible. Allow the piston or ball to fall to the bottom of the column. 7. Remove the mouthpiece from your mouth and breathe out normally. 8. Rest for a few seconds and repeat Steps 1 through 7 at least 10 times every 1-2 hours when you are awake. Take your time and take a few normal breaths between deep breaths. 9. The spirometer may include an indicator to show your best effort. Use the indicator as a goal to work toward during each repetition. 10. After each set of 10 deep breaths, practice coughing to be sure your lungs are clear. If you have an incision (the cut made at the time of surgery), support your incision when coughing by placing a pillow or rolled up towels firmly  against it. Once you are able to get out of bed, walk around indoors and cough well. You may stop using the incentive spirometer when instructed by your caregiver.  RISKS AND COMPLICATIONS  Take your time so you do not get dizzy or light-headed.  If you are in pain, you may need to take or ask for pain medication before doing incentive spirometry. It is harder to take a deep breath if you are having pain. AFTER USE  Rest and breathe slowly and easily.  It can be helpful to keep track of a log of your progress. Your caregiver can provide you with a simple table to help with this. If you are using the spirometer at home, follow these instructions: SEEK MEDICAL CARE IF:   You are having difficultly using the spirometer.  You have trouble using the spirometer as often as instructed.  Your pain medication is not giving enough relief while using the spirometer.  You develop fever of 100.5 F (38.1 C) or higher. SEEK IMMEDIATE MEDICAL CARE IF:   You cough up bloody sputum that had not been present before.  You develop fever of 102 F (38.9  C) or greater.  You develop worsening pain at or near the incision site. MAKE SURE YOU:   Understand these instructions.  Will watch your condition.  Will get help right away if you are not doing well or get worse. Document Released: 03/15/2007 Document Revised: 01/25/2012 Document Reviewed: 05/16/2007 [facility] Patient Information 2014 [place], [place].   ________________________________________________________________________

## 2020-12-13 ENCOUNTER — Encounter (HOSPITAL_COMMUNITY): Payer: Self-pay

## 2020-12-13 ENCOUNTER — Encounter (HOSPITAL_COMMUNITY)
Admission: RE | Admit: 2020-12-13 | Discharge: 2020-12-13 | Disposition: A | Discharge status: Home / Self Care | Payer: Medicare Other | Source: Ambulatory Visit | Attending: Podiatry | Admitting: Podiatry

## 2020-12-13 ENCOUNTER — Other Ambulatory Visit: Payer: Self-pay

## 2020-12-13 DIAGNOSIS — Z01812 Encounter for preprocedural laboratory examination: Secondary | ICD-10-CM | POA: Diagnosis not present

## 2020-12-13 DIAGNOSIS — I10 Essential (primary) hypertension: Secondary | ICD-10-CM | POA: Diagnosis not present

## 2020-12-13 LAB — BASIC METABOLIC PANEL
Anion gap: 11 (ref 5–15)
BUN: 16 mg/dL (ref 6–20)
CO2: 25 mmol/L (ref 22–32)
Calcium: 9.3 mg/dL (ref 8.9–10.3)
Chloride: 100 mmol/L (ref 98–111)
Creatinine, Ser: 0.85 mg/dL (ref 0.61–1.24)
GFR, Estimated: >60 mL/min (ref >60)
Glucose, Bld: 236 mg/dL — ABNORMAL HIGH (ref 70–99)
Potassium: 3.9 mmol/L (ref 3.5–5.1)
Sodium: 136 mmol/L (ref 135–145)

## 2020-12-13 LAB — CBC
HCT: 44.7 % (ref 39.0–52.0)
Hemoglobin: 14.6 g/dL (ref 13.0–17.0)
MCH: 28.5 pg (ref 26.0–34.0)
MCHC: 32.7 g/dL (ref 30.0–36.0)
MCV: 87.3 fL (ref 80.0–100.0)
Platelets: 296 10*3/uL (ref 150–400)
RBC: 5.12 MIL/uL (ref 4.22–5.81)
RDW: 13.5 % (ref 11.5–15.5)
WBC: 7.8 10*3/uL (ref 4.0–10.5)
nRBC: 0 % (ref 0.0–0.2)

## 2020-12-13 LAB — HEMOGLOBIN A1C
Hgb A1c MFr Bld: 5.7 % — ABNORMAL HIGH (ref 4.8–5.6)
Mean Plasma Glucose: 116.89 mg/dL

## 2020-12-13 LAB — GLUCOSE, CAPILLARY: Glucose-Capillary: 258 mg/dL — ABNORMAL HIGH (ref 70–99)

## 2020-12-16 ENCOUNTER — Other Ambulatory Visit (HOSPITAL_COMMUNITY): Payer: Medicare Other

## 2020-12-17 ENCOUNTER — Other Ambulatory Visit (HOSPITAL_COMMUNITY)
Admission: RE | Admit: 2020-12-17 | Discharge: 2020-12-17 | Disposition: A | Discharge status: Home / Self Care | Payer: Medicare Other | Source: Ambulatory Visit | Attending: Podiatry | Admitting: Podiatry

## 2020-12-17 DIAGNOSIS — Z01812 Encounter for preprocedural laboratory examination: Secondary | ICD-10-CM | POA: Insufficient documentation

## 2020-12-17 DIAGNOSIS — Z20822 Contact with and (suspected) exposure to covid-19: Secondary | ICD-10-CM | POA: Diagnosis not present

## 2020-12-17 LAB — SARS CORONAVIRUS 2 (TAT 6-24 HRS): SARS Coronavirus 2: NEGATIVE

## 2020-12-17 NOTE — Progress Notes (Signed)
Spoke with Burnett Harry at Dr. Kandice Hams office regarding H&P, she stated that patient was seeing PCP today and she will fax over H&P when she receives it.  Short Stay made aware.

## 2020-12-18 ENCOUNTER — Ambulatory Visit (HOSPITAL_COMMUNITY): Payer: Medicare Other | Admitting: Anesthesiology

## 2020-12-18 ENCOUNTER — Other Ambulatory Visit: Payer: Self-pay

## 2020-12-18 ENCOUNTER — Ambulatory Visit (HOSPITAL_COMMUNITY): Payer: Medicare Other | Admitting: Physician Assistant

## 2020-12-18 ENCOUNTER — Ambulatory Visit (HOSPITAL_COMMUNITY)
Admission: RE | Admit: 2020-12-18 | Discharge: 2020-12-18 | Disposition: A | Discharge status: Home / Self Care | Payer: Medicare Other | Attending: Podiatry | Admitting: Podiatry

## 2020-12-18 ENCOUNTER — Encounter (HOSPITAL_COMMUNITY): Payer: Self-pay | Admitting: Podiatry

## 2020-12-18 ENCOUNTER — Encounter (HOSPITAL_COMMUNITY)
Admission: RE | Disposition: A | Discharge status: Home / Self Care | Payer: Self-pay | Source: Home / Self Care | Attending: Podiatry

## 2020-12-18 DIAGNOSIS — L03116 Cellulitis of left lower limb: Secondary | ICD-10-CM | POA: Insufficient documentation

## 2020-12-18 DIAGNOSIS — L97422 Non-pressure chronic ulcer of left heel and midfoot with fat layer exposed: Secondary | ICD-10-CM | POA: Diagnosis not present

## 2020-12-18 DIAGNOSIS — L02612 Cutaneous abscess of left foot: Secondary | ICD-10-CM | POA: Diagnosis not present

## 2020-12-18 DIAGNOSIS — E11621 Type 2 diabetes mellitus with foot ulcer: Secondary | ICD-10-CM

## 2020-12-18 DIAGNOSIS — L97423 Non-pressure chronic ulcer of left heel and midfoot with necrosis of muscle: Secondary | ICD-10-CM | POA: Insufficient documentation

## 2020-12-18 DIAGNOSIS — E08621 Diabetes mellitus due to underlying condition with foot ulcer: Secondary | ICD-10-CM

## 2020-12-18 HISTORY — PX: WOUND DEBRIDEMENT: SHX247

## 2020-12-18 LAB — GLUCOSE, CAPILLARY
Glucose-Capillary: 81 mg/dL (ref 70–99)
Glucose-Capillary: 83 mg/dL (ref 70–99)

## 2020-12-18 SURGERY — DEBRIDEMENT, WOUND
Anesthesia: Monitor Anesthesia Care | Laterality: Left

## 2020-12-18 MED ORDER — MIDAZOLAM HCL 2 MG/2ML IJ SOLN
INTRAMUSCULAR | Status: AC
  Filled 2020-12-18: qty 4

## 2020-12-18 MED ORDER — BUPIVACAINE HCL (PF) 0.5 % IJ SOLN
INTRAMUSCULAR | Status: DC | PRN
  Administered 2020-12-18: 10 mL

## 2020-12-18 MED ORDER — ORAL CARE MOUTH RINSE: 15.0000 mL | Freq: Once | OROMUCOSAL | Status: AC

## 2020-12-18 MED ORDER — DEXAMETHASONE SODIUM PHOSPHATE 10 MG/ML IJ SOLN
INTRAMUSCULAR | Status: AC
  Filled 2020-12-18: qty 1

## 2020-12-18 MED ORDER — MIDAZOLAM HCL 5 MG/5ML IJ SOLN
INTRAMUSCULAR | Status: DC | PRN
  Administered 2020-12-18 (×2): 2 mg via INTRAVENOUS

## 2020-12-18 MED ORDER — PROPOFOL 500 MG/50ML IV EMUL
INTRAVENOUS | Status: DC | PRN
  Administered 2020-12-18: 100 ug/kg/min via INTRAVENOUS

## 2020-12-18 MED ORDER — BUPIVACAINE HCL (PF) 0.5 % IJ SOLN
INTRAMUSCULAR | Status: AC
  Filled 2020-12-18: qty 30

## 2020-12-18 MED ORDER — FENTANYL CITRATE (PF) 100 MCG/2ML IJ SOLN
25.0000 ug | INTRAMUSCULAR | Status: DC | PRN
  Administered 2020-12-18: 50 ug via INTRAVENOUS

## 2020-12-18 MED ORDER — OXYCODONE-ACETAMINOPHEN 10-325 MG PO TABS: 1.0000 | ORAL_TABLET | ORAL | 0 refills | Status: DC | PRN

## 2020-12-18 MED ORDER — ONDANSETRON HCL 4 MG/2ML IJ SOLN
INTRAMUSCULAR | Status: DC | PRN
  Administered 2020-12-18: 4 mg via INTRAVENOUS

## 2020-12-18 MED ORDER — CHLORHEXIDINE GLUCONATE 0.12 % MT SOLN
15.0000 mL | Freq: Once | OROMUCOSAL | Status: AC
  Administered 2020-12-18: 15 mL via OROMUCOSAL

## 2020-12-18 MED ORDER — ONDANSETRON HCL 4 MG/2ML IJ SOLN
INTRAMUSCULAR | Status: AC
  Filled 2020-12-18: qty 2

## 2020-12-18 MED ORDER — CEFAZOLIN SODIUM-DEXTROSE 2-4 GM/100ML-% IV SOLN
2.0000 g | INTRAVENOUS | Status: AC
  Administered 2020-12-18: 2 g via INTRAVENOUS
  Filled 2020-12-18: qty 100

## 2020-12-18 MED ORDER — FENTANYL CITRATE (PF) 100 MCG/2ML IJ SOLN
INTRAMUSCULAR | Status: AC
  Administered 2020-12-18: 50 ug via INTRAVENOUS
  Filled 2020-12-18: qty 4

## 2020-12-18 MED ORDER — EPHEDRINE SULFATE-NACL 50-0.9 MG/10ML-% IV SOSY
PREFILLED_SYRINGE | INTRAVENOUS | Status: DC | PRN
  Administered 2020-12-18: 10 mg via INTRAVENOUS

## 2020-12-18 MED ORDER — SODIUM CHLORIDE 0.9 % IR SOLN
Status: DC | PRN
  Administered 2020-12-18: 3000 mL

## 2020-12-18 MED ORDER — LACTATED RINGERS IV SOLN: INTRAVENOUS | Status: DC

## 2020-12-18 MED ORDER — FENTANYL CITRATE (PF) 250 MCG/5ML IJ SOLN
INTRAMUSCULAR | Status: DC | PRN
  Administered 2020-12-18 (×2): 50 ug via INTRAVENOUS

## 2020-12-18 MED ORDER — PROPOFOL 10 MG/ML IV BOLUS
INTRAVENOUS | Status: DC | PRN
  Administered 2020-12-18: 40 mg via INTRAVENOUS

## 2020-12-18 MED ORDER — DEXAMETHASONE SODIUM PHOSPHATE 10 MG/ML IJ SOLN
INTRAMUSCULAR | Status: DC | PRN
  Administered 2020-12-18: 5 mg via INTRAVENOUS

## 2020-12-18 MED ORDER — FENTANYL CITRATE (PF) 100 MCG/2ML IJ SOLN
INTRAMUSCULAR | Status: AC
  Filled 2020-12-18: qty 2

## 2020-12-18 MED ORDER — AMISULPRIDE (ANTIEMETIC) 5 MG/2ML IV SOLN: 10.0000 mg | Freq: Once | INTRAVENOUS | Status: DC | PRN

## 2020-12-18 MED ORDER — AMOXICILLIN-POT CLAVULANATE 875-125 MG PO TABS: 1.0000 | ORAL_TABLET | Freq: Two times a day (BID) | ORAL | 0 refills | Status: DC

## 2020-12-18 MED ORDER — EPHEDRINE 5 MG/ML INJ
INTRAVENOUS | Status: AC
  Filled 2020-12-18: qty 10

## 2020-12-18 MED ORDER — PROPOFOL 10 MG/ML IV BOLUS
INTRAVENOUS | Status: AC
  Filled 2020-12-18: qty 20

## 2020-12-18 SURGICAL SUPPLY — 59 items
BLADE HEX COATED 2.75 (ELECTRODE) ×2 IMPLANT
BLADE OSCILLATING/SAGITTAL (BLADE)
BLADE SURG 15 STRL LF DISP TIS (BLADE) ×1 IMPLANT
BLADE SURG 15 STRL SS (BLADE) ×1
BLADE SW THK.38XMED LNG THN (BLADE) IMPLANT
BNDG ELASTIC 3X5.8 VLCR STR LF (GAUZE/BANDAGES/DRESSINGS) ×2 IMPLANT
BNDG ELASTIC 4X5.8 VLCR STR LF (GAUZE/BANDAGES/DRESSINGS) ×4 IMPLANT
BNDG ESMARK 4X9 LF (GAUZE/BANDAGES/DRESSINGS) ×2 IMPLANT
BNDG GAUZE ELAST 4 BULKY (GAUZE/BANDAGES/DRESSINGS) ×2 IMPLANT
CHLORAPREP W/TINT 26 (MISCELLANEOUS) ×2 IMPLANT
COVER BACK TABLE 60X90IN (DRAPES) ×2 IMPLANT
COVER WAND RF STERILE (DRAPES) IMPLANT
CUFF TOURN SGL QUICK 18X4 (TOURNIQUET CUFF) IMPLANT
DRAPE EXTREMITY T 121X128X90 (DISPOSABLE) ×2 IMPLANT
DRAPE IMP U-DRAPE 54X76 (DRAPES) ×2 IMPLANT
DRAPE U-SHAPE 47X51 STRL (DRAPES) ×2 IMPLANT
DRSG EMULSION OIL 3X3 NADH (GAUZE/BANDAGES/DRESSINGS) ×2 IMPLANT
DRSG PAD ABDOMINAL 8X10 ST (GAUZE/BANDAGES/DRESSINGS) IMPLANT
ELECT REM PT RETURN 15FT ADLT (MISCELLANEOUS) ×2 IMPLANT
GAUZE 4X4 16PLY RFD (DISPOSABLE) IMPLANT
GAUZE SPONGE 4X4 12PLY STRL (GAUZE/BANDAGES/DRESSINGS) ×2 IMPLANT
GAUZE XEROFORM 1X8 LF (GAUZE/BANDAGES/DRESSINGS) IMPLANT
GAUZE XEROFORM 5X9 LF (GAUZE/BANDAGES/DRESSINGS) ×2 IMPLANT
GLOVE BIO SURGEON STRL SZ7.5 (GLOVE) ×2 IMPLANT
GLOVE ECLIPSE 8.0 STRL XLNG CF (GLOVE) ×2 IMPLANT
GLOVE SRG 8 PF TXTR STRL LF DI (GLOVE) ×1 IMPLANT
GLOVE SURG UNDER POLY LF SZ8 (GLOVE) ×1
GOWN STRL REUS W/ TWL LRG LVL3 (GOWN DISPOSABLE) ×1 IMPLANT
GOWN STRL REUS W/TWL LRG LVL3 (GOWN DISPOSABLE) ×1
GOWN STRL REUS W/TWL XL LVL3 (GOWN DISPOSABLE) ×4 IMPLANT
KIT BASIN OR (CUSTOM PROCEDURE TRAY) ×2 IMPLANT
MANIFOLD NEPTUNE II (INSTRUMENTS) ×2 IMPLANT
MICROMATRIX 1000MG (Tissue) ×2 IMPLANT
NEEDLE HYPO 25X1 1.5 SAFETY (NEEDLE) ×2 IMPLANT
NS IRRIG 1000ML POUR BTL (IV SOLUTION) IMPLANT
PACK ORTHO EXTREMITY (CUSTOM PROCEDURE TRAY) ×2 IMPLANT
PADDING CAST ABS 4INX4YD NS (CAST SUPPLIES) ×1
PADDING CAST ABS COTTON 4X4 ST (CAST SUPPLIES) ×1 IMPLANT
PADDING CAST SYNTHETIC 4 (CAST SUPPLIES) ×1
PADDING CAST SYNTHETIC 4X4 STR (CAST SUPPLIES) ×1 IMPLANT
PENCIL SMOKE EVACUATOR (MISCELLANEOUS) IMPLANT
SET IRRIG Y TYPE TUR BLADDER L (SET/KITS/TRAYS/PACK) IMPLANT
SLEEVE SCD COMPRESS KNEE MED (MISCELLANEOUS) ×2 IMPLANT
SOLUTION PARTIC MCRMTRX 1000MG (Tissue) ×1 IMPLANT
SPONGE SURGIFOAM ABS GEL 100 (HEMOSTASIS) IMPLANT
STAPLER VISISTAT 35W (STAPLE) ×2 IMPLANT
STOCKINETTE 8 INCH (MISCELLANEOUS) ×4 IMPLANT
SUT ETHILON 3 0 PS 1 (SUTURE) IMPLANT
SUT ETHILON 4 0 PS 2 18 (SUTURE) IMPLANT
SUT MNCRL AB 3-0 PS2 18 (SUTURE) IMPLANT
SUT MNCRL AB 4-0 PS2 18 (SUTURE) IMPLANT
SUT MON AB 5-0 PS2 18 (SUTURE) IMPLANT
SUT VIC AB 3-0 FS2 27 (SUTURE) ×2 IMPLANT
SUT VIC AB 4-0 PS2 18 (SUTURE) ×2 IMPLANT
SYR BULB EAR ULCER 3OZ GRN STR (SYRINGE) ×2 IMPLANT
SYR CONTROL 10ML LL (SYRINGE) ×2 IMPLANT
TOWEL OR 17X26 10 PK STRL BLUE (TOWEL DISPOSABLE) ×2 IMPLANT
UNDERPAD 30X36 HEAVY ABSORB (UNDERPADS AND DIAPERS) ×2 IMPLANT
YANKAUER SUCT BULB TIP NO VENT (SUCTIONS) ×2 IMPLANT

## 2020-12-18 NOTE — Anesthesia Postprocedure Evaluation (Signed)
Anesthesia Post Note  Patient: Charles Thompson  Procedure(s) Performed: DEBRIDEMENT WOUND/COMPLEX WOUND CLOSURE OF HEEL (Left )     Patient location during evaluation: PACU Anesthesia Type: MAC Level of consciousness: awake and alert Pain management: pain level controlled Vital Signs Assessment: post-procedure vital signs reviewed and stable Respiratory status: spontaneous breathing and respiratory function stable Cardiovascular status: stable Postop Assessment: no apparent nausea or vomiting Anesthetic complications: no   No complications documented.  Last Vitals:  Vitals:   12/18/20 1710 12/18/20 1715  BP:  135/74  Pulse: 70 (!) 52  Resp: 15 17  Temp:    SpO2: 97% 93%    Last Pain:  Vitals:   12/18/20 1720  PainSc: 2                  Aracely Rickett DANIEL

## 2020-12-18 NOTE — Discharge Instructions (Signed)

## 2020-12-18 NOTE — Transfer of Care (Signed)
Immediate Anesthesia Transfer of Care Note  Patient: Charles Thompson  Procedure(s) Performed: DEBRIDEMENT WOUND/COMPLEX WOUND CLOSURE OF HEEL (Left )  Patient Location: PACU  Anesthesia Type:MAC  Level of Consciousness: awake, alert  and oriented  Airway & Oxygen Therapy: Patient Spontanous Breathing and Patient connected to face mask  Post-op Assessment: Report given to RN and Post -op Vital signs reviewed and stable  Post vital signs: Reviewed and stable  Last Vitals:  Vitals Value Taken Time  BP 117/76 12/18/20 1656  Temp    Pulse 84 12/18/20 1659  Resp 17 12/18/20 1659  SpO2 99 % 12/18/20 1659  Vitals shown include unvalidated device data.  Last Pain:  Vitals:   12/18/20 1404  PainSc: 2          Complications: No complications documented.

## 2020-12-18 NOTE — Interval H&P Note (Signed)
History and Physical Interval Note:  12/18/2020 3:44 PM  Charles Thompson  has presented today for surgery, with the diagnosis of Diabetic ulcer with necrosis of muscle.  The various methods of treatment have been discussed with the patient and family. After consideration of risks, benefits and other options for treatment, the patient has consented to  Procedure(s): DEBRIDEMENT WOUND (Left) SKIN GRAFT Substitute (Left) as a surgical intervention.  The patient's history has been reviewed, patient examined, no change in status, stable for surgery.  I have reviewed the patient's chart and labs.  Questions were answered to the patient's satisfaction.     Park Liter

## 2020-12-18 NOTE — Op Note (Signed)
  Patient Name: Charles Thompson DOB: 08-Aug-1961  MRN: [id number]   Date of Surgery: 12/18/2020  Surgeon: Dr. Hardie Pulley, DPM Assistants: none  Pre-operative Diagnosis:  Diabetic ulcer of left forefoot Diabetic ulcer of left heel Post-operative Diagnosis:  Same Procedures:  1) left forefoot wound debridement and irrigation  2) left heel wound debridement with closure -complex wound Pathology/Specimens: ID Type Source Tests Collected by Time Destination  A : left foot swab Wound Wound GRAM STAIN, AEROBIC/ANAEROBIC CULTURE (SURGICAL/DEEP WOUND) Evelina Bucy, DPM 12/18/2020 1608    Anesthesia: MAC local Hemostasis: * No tourniquets in log * Estimated Blood Loss: 50 mL Materials:  Implant Name Type Inv. Item Serial No. Manufacturer Lot No. LRB No. Used Action  MICROMATRIX 1000MG - [id number] Tissue MICROMATRIX 1000MG [id number] Left 1 Implanted   Medications: 1 g vancomycin powder Complications: None  Indications for Procedure:  This is a 60 y.o. male with chronic wounds to the forefoot and heel who presents today for debridement after failing conservative local wound care   Procedure in Detail: Patient was identified in pre-operative holding area. Formal consent was signed and the left lower extremity was marked. Patient was brought back to the operating room. Anesthesia was induced. The extremity was prepped and draped in the usual sterile fashion. Timeout was taken to confirm patient name, laterality, and procedure prior to incision.   Attention was then directed to the left foot.  There were wounds noted to the forefoot and heel.  The forefoot wound measured 6 x 2 and the heel measured 5 x 1.5.  Both wounds were sharply excisionally debrided with 15 blade down to bleeding viable wound base.  Both wounds were debrided to the level of deep fascia.  The heel wound was noted to be deep without evidence of exposed bone.  After thorough sharp debridement with a 15 blade  the wound was then further debrided with pulse lavage and Misonix ultrasonic debrider.  The forefoot wound at this point measuring 6 x 2.5 in the heel 5 x 2. A slurry of vancomycin powder and micro matrix powder was applied to each wound.  The heel wound was then able to be primarily closed with loosely approximated #1 nylon mattress sutures.  The forefoot wound was sutured with #1 nylon retention suture.  Post retention sutures the wound measured 6 x 1.5.  The foot was then dressed with xeroform, 4x4 kerlix, and ACE. Patient tolerated the procedure well.  Disposition: Following a period of post-operative monitoring, patient will be transferred home.

## 2020-12-18 NOTE — Anesthesia Preprocedure Evaluation (Addendum)
Anesthesia Evaluation  Patient identified by MRN, date of birth, ID band Patient awake    Reviewed: Allergy & Precautions, NPO status , Patient's Chart, lab work & pertinent test results  History of Anesthesia Complications Negative for: history of anesthetic complications  Airway Mallampati: II  TM Distance: >3 FB Neck ROM: Full    Dental  (+) Teeth Intact, Dental Advisory Given   Pulmonary Current SmokerPatient did not abstain from smoking.,  <1ppd, no inhalers   Pulmonary exam normal breath sounds clear to auscultation       Cardiovascular hypertension, Pt. on medications Normal cardiovascular exam Rhythm:Regular Rate:Normal     Neuro/Psych PSYCHIATRIC DISORDERS Anxiety Depression Diabetic peripheral neuropathy  Neuromuscular disease    GI/Hepatic Neg liver ROS, GERD  Medicated and Controlled,  Endo/Other  diabetes, Well Controlled, Type 2, Insulin Dependent  Renal/GU   negative genitourinary   Musculoskeletal  (+) Arthritis , Osteoarthritis,  Chronic ulcer left heel 2/2 T2DM   Abdominal   Peds  Hematology negative hematology ROS (+)   Anesthesia Other Findings   Reproductive/Obstetrics negative OB ROS                            Anesthesia Physical  Anesthesia Plan  ASA: III  Anesthesia Plan: MAC   Post-op Pain Management:    Induction:   PONV Risk Score and Plan: 1 and Treatment may vary due to age or medical condition, Propofol infusion and TIVA  Airway Management Planned: Natural Airway and Nasal Cannula  Additional Equipment: None  Intra-op Plan:   Post-operative Plan:   Informed Consent: I have reviewed the patients History and Physical, chart, labs and discussed the procedure including the risks, benefits and alternatives for the proposed anesthesia with the patient or authorized representative who has indicated his/her understanding and acceptance.     Dental  advisory given  Plan Discussed with: Anesthesiologist and CRNA  Anesthesia Plan Comments:        Anesthesia Quick Evaluation

## 2020-12-19 ENCOUNTER — Encounter (HOSPITAL_COMMUNITY): Payer: Self-pay | Admitting: Podiatry

## 2020-12-23 LAB — AEROBIC/ANAEROBIC CULTURE W GRAM STAIN (SURGICAL/DEEP WOUND)

## 2020-12-24 ENCOUNTER — Other Ambulatory Visit: Payer: Self-pay

## 2020-12-24 ENCOUNTER — Ambulatory Visit (INDEPENDENT_AMBULATORY_CARE_PROVIDER_SITE_OTHER): Payer: Medicare Other | Admitting: Podiatrist

## 2020-12-24 ENCOUNTER — Encounter: Payer: Self-pay | Admitting: Podiatrist

## 2020-12-24 DIAGNOSIS — Z89432 Acquired absence of left foot: Secondary | ICD-10-CM

## 2020-12-24 DIAGNOSIS — Z9889 Other specified postprocedural states: Secondary | ICD-10-CM

## 2020-12-24 MED ORDER — CADEXOMER IODINE 0.9 % EX GEL: 1.0000 "application " | CUTANEOUS | 0 refills | Status: DC

## 2020-12-30 NOTE — Progress Notes (Signed)
Chief Complaint  Patient presents with  . Routine Post Op    POV#1 -pt denies N/V/F/Ch -dressing not left intact due to bleeding through -daily abx and betadine dressing change tx: boot, elevation and oxycodone   . Diabetes    FBS: na A1C: 5.6     Subjective: Patient presents for follow up of the left foot- ulceration debridement.   Patient denies nausea, vomiting, fevers, chills or night sweats.  Denies calf pain or tenderness to the operative side. Patient relates there was some break through bleeding so he removed the dressing and did a betadine wet to dry dressing change.    Objective:  Neurovascular status unchanged with pedal pulses palpable-  Sensation diminshed.  Dressing removed and no malodor is noted.  No pus or pirulence is seen.  Incision sites have sutures in place and appear to be healing at the plantar forefoot.  Heel ulceration also stable.     Assessment: Status post debridment of left foot.  Plan:  Applied iodosorb and a dry and compressive dressing.  Recommended iodosorb dressing changes as he has previously been doing.  He will continue to wear the boot and asked if he could have a different type of shoe or boot-  He will discuss with Dr. Samuella Cota at his next follow up in a week.

## 2020-12-31 ENCOUNTER — Other Ambulatory Visit: Payer: Self-pay

## 2020-12-31 ENCOUNTER — Ambulatory Visit (INDEPENDENT_AMBULATORY_CARE_PROVIDER_SITE_OTHER): Payer: Medicare Other | Admitting: Podiatry

## 2020-12-31 DIAGNOSIS — L97422 Non-pressure chronic ulcer of left heel and midfoot with fat layer exposed: Secondary | ICD-10-CM | POA: Diagnosis not present

## 2020-12-31 DIAGNOSIS — Z9889 Other specified postprocedural states: Secondary | ICD-10-CM

## 2020-12-31 DIAGNOSIS — Z89432 Acquired absence of left foot: Secondary | ICD-10-CM

## 2020-12-31 DIAGNOSIS — E08621 Diabetes mellitus due to underlying condition with foot ulcer: Secondary | ICD-10-CM | POA: Diagnosis not present

## 2020-12-31 MED ORDER — OXYCODONE-ACETAMINOPHEN 10-325 MG PO TABS: 1.0000 | ORAL_TABLET | ORAL | 0 refills | Status: DC | PRN

## 2021-01-13 NOTE — Progress Notes (Signed)
  Subjective:  Patient ID: Charles Thompson, male    DOB: 25-Aug-1961,  MRN: [id number]  Chief Complaint  Patient presents with  . Routine Post Op    POV#2 -pt denies N/V/F?Ch - pt states," hard for me to see but I think it's a little better." -4/10 pain -=w/ draiange -no aredness/swelling tx: abx spray and absorbic padding dressign     60 y.o. male presents for wound care. Hx confirmed with patient.  Objective:  Physical Exam:  Distal wound measuring 4x1.5 Proximal wound measuring 5x1 Both without excessive warmth, erythema, signs of infection. No probe to bone. Retention sutures intact.     Assessment:   1. Diabetic ulcer of left heel associated with diabetes mellitus due to underlying condition, with fat layer exposed (HCC)   2. History of transmetatarsal amputation of left foot (HCC)   3. Status post left foot surgery      Plan:  Patient was evaluated and treated and all questions answered.  Ulcer Right foot -Offload ulcer with CAM boot -Wound cleansed and debrided -Refill Percocet -Should wound stall, plan for surgical debridement.  Procedure: Selective Debridement of Wound Rationale: Removal of devitalized tissue from the wound to promote healing.  Pre-Debridement Wound Measurements: 4x1.5; 5x1  Post-Debridement Wound Measurements: same as pre-debridement. Type of Debridement: sharp selective Instrumentation: dermal curette, tissue nipper Tissue Removed: Devitalized soft-tissue Dressing: Dry, sterile, compression dressing. Disposition: Patient tolerated procedure well.   Return in about 2 weeks (around 01/14/2021) for Wound Care.

## 2021-01-14 ENCOUNTER — Ambulatory Visit (INDEPENDENT_AMBULATORY_CARE_PROVIDER_SITE_OTHER): Payer: Medicare Other | Admitting: Podiatry

## 2021-01-14 ENCOUNTER — Other Ambulatory Visit: Payer: Self-pay

## 2021-01-14 ENCOUNTER — Ambulatory Visit: Payer: Self-pay | Admitting: Podiatry

## 2021-01-14 ENCOUNTER — Ambulatory Visit (INDEPENDENT_AMBULATORY_CARE_PROVIDER_SITE_OTHER): Payer: Medicare Other

## 2021-01-14 DIAGNOSIS — E08621 Diabetes mellitus due to underlying condition with foot ulcer: Secondary | ICD-10-CM

## 2021-01-14 DIAGNOSIS — L97422 Non-pressure chronic ulcer of left heel and midfoot with fat layer exposed: Secondary | ICD-10-CM

## 2021-01-14 DIAGNOSIS — M86171 Other acute osteomyelitis, right ankle and foot: Secondary | ICD-10-CM

## 2021-01-14 DIAGNOSIS — Z9889 Other specified postprocedural states: Secondary | ICD-10-CM

## 2021-01-14 DIAGNOSIS — Z89432 Acquired absence of left foot: Secondary | ICD-10-CM

## 2021-01-14 NOTE — H&P (View-Only) (Signed)
  Subjective:  Patient ID: Charles Thompson, male    DOB: 07/22/1961,  MRN: [id number]  Chief Complaint  Patient presents with  . Wound Check    F/U Lt ulcer check --pt denies N/V/F/Ch -per pt he can not see the wound so it's hard for him to tell if improving or not -with more draiange -w/ pain; 4/10 Tx: abx ointment, dressing change daily    60 y.o. male presents for wound care. Hx confirmed with patient. New pain today noted at the inside of the ankle. States he just noticed this pain today. Objective:  Physical Exam: Wound Location: left foot distal, heel Wound Measurement: 2x3 distally, 1 x5.5 proximally Wound Base: Mixed Granular/Fibrotic Peri-wound: Calloused Exudate: None: wound tissue dry wound without warmth, erythema, signs of acute infection No probe to bone  Radiographs:  X-ray of the left foot: possible wispy area of the inferior portion of the calcaneus Assessment:   1. Diabetic ulcer of left heel associated with diabetes mellitus due to underlying condition, with fat layer exposed (HCC)   2. Other acute osteomyelitis of right foot (HCC)   3. History of transmetatarsal amputation of left foot (HCC)    Plan:  Patient was evaluated and treated and all questions answered.  Ulcer left heel -Overall he is doing better. He does have new pain in the medial calcaneal area today that is concerning for osteomyelitis. The wounds are well appearing, however. -XR taken and reviewed. Possible wispy area - will order MRI to eval for osteomyelitis. -Debrided left foot wounds. Retention sutures removed. -Plan for repeat debridement and partial close -Patient has failed all conservative therapy and wishes to proceed with surgical intervention. All risks, benefits, and alternatives discussed with patient. No guarantees given. Consent reviewed and signed by patient. -Planned procedures: Left foot debridement of wounds, possible calcaneal bone biopsy, possible skin graft substitute -ASA 3  - Patient with moderate systemic disease with functional limitations   Right foot calluses -Ulcer submet 1 healed, other calluses trimmed.  No follow-ups on file.

## 2021-01-14 NOTE — Progress Notes (Signed)
  Subjective:  Patient ID: Charles Thompson, male    DOB: 02/17/1961,  MRN: [id number]  Chief Complaint  Patient presents with  . Wound Check    F/U Lt ulcer check --pt denies N/V/F/Ch -per pt he can not see the wound so it's hard for him to tell if improving or not -with more draiange -w/ pain; 4/10 Tx: abx ointment, dressing change daily    59 y.o. male presents for wound care. Hx confirmed with patient. New pain today noted at the inside of the ankle. States he just noticed this pain today. Objective:  Physical Exam: Wound Location: left foot distal, heel Wound Measurement: 2x3 distally, 1 x5.5 proximally Wound Base: Mixed Granular/Fibrotic Peri-wound: Calloused Exudate: None: wound tissue dry wound without warmth, erythema, signs of acute infection No probe to bone  Radiographs:  X-ray of the left foot: possible wispy area of the inferior portion of the calcaneus Assessment:   1. Diabetic ulcer of left heel associated with diabetes mellitus due to underlying condition, with fat layer exposed (HCC)   2. Other acute osteomyelitis of right foot (HCC)   3. History of transmetatarsal amputation of left foot (HCC)    Plan:  Patient was evaluated and treated and all questions answered.  Ulcer left heel -Overall he is doing better. He does have new pain in the medial calcaneal area today that is concerning for osteomyelitis. The wounds are well appearing, however. -XR taken and reviewed. Possible wispy area - will order MRI to eval for osteomyelitis. -Debrided left foot wounds. Retention sutures removed. -Plan for repeat debridement and partial close -Patient has failed all conservative therapy and wishes to proceed with surgical intervention. All risks, benefits, and alternatives discussed with patient. No guarantees given. Consent reviewed and signed by patient. -Planned procedures: Left foot debridement of wounds, possible calcaneal bone biopsy, possible skin graft substitute -ASA 3  - Patient with moderate systemic disease with functional limitations   Right foot calluses -Ulcer submet 1 healed, other calluses trimmed.  No follow-ups on file.  

## 2021-01-15 ENCOUNTER — Telehealth: Payer: Self-pay

## 2021-01-15 NOTE — Telephone Encounter (Signed)
DOS 01/22/2021  DEBRIDEMENT RIGHT FOOT- 11043 BONE BIOPSY RT - 20240  [facility] MEDICARE EFFECTIVE DATE - 11/16/2020  PLAN DEDUCTIBLE - $0.00 OUT OF POCKET - $6700.00 W/ $8882.80 REMAINING  CO-INSURANCE 0% / Day OUTPATIENT SURGERY 0% / Day OUTPATIENT HOSPITAL COPAY $395 / Day OUTPATIENT SURGERY $395 / Day OUTPATIENT HOSPITAL  Notification or Prior Authorization is not required for the requested services  Decision ID #:[id number]

## 2021-01-17 ENCOUNTER — Encounter (HOSPITAL_BASED_OUTPATIENT_CLINIC_OR_DEPARTMENT_OTHER): Payer: Self-pay | Admitting: Podiatry

## 2021-01-17 ENCOUNTER — Other Ambulatory Visit: Payer: Self-pay

## 2021-01-17 NOTE — Progress Notes (Signed)
Spoke w/ via phone for pre-op interview--- PT Lab needs dos----  Istat             Lab results------ current ekg in epic/ chart COVID test ------ 01-18-2021 @ 1045 Arrive at ------- 1230 on 01-22-2021 NPO after MN NO Solid Food.  Clear liquids from MN until--- 1130 Medications to take morning of surgery ----- Zoloft, Lipitor, Norvasc, Prilosec Diabetic medication ----- do not do toujeo insulin morning of surgery, night before surgery only do half dose toujeo insulin and take synjardy as usual night before surgery Patient Special Instructions ----- na/ Pre-Op special Istructions ----- received pt's pcp , Dr Wylene Simmer, H&P dated 01-16-2021 via fax from Dr Samuella Cota office, placed with chart Patient verbalized understanding of instructions that were given at this phone interview. Patient denies shortness of breath, chest pain, fever, cough at this phone interview.  PCP:  Dr Wylene Simmer (lov 10-30-2020 with chart) EKG : 10-19-2020 epic Echo : no Stress test: per pt never had  atest Cardiac Cath : no Activity level:  Denies sob w/ any activity Sleep Study/ CPAP : NO Fasting Blood Sugar : 120     / Checks Blood Sugar -- times a day:  6 times per week in am Blood Thinner/ Instructions /Last Dose:  NO ASA / Instructions/ Last Dose : ASA 81mg 

## 2021-01-18 ENCOUNTER — Other Ambulatory Visit (HOSPITAL_COMMUNITY)
Admission: RE | Admit: 2021-01-18 | Discharge: 2021-01-18 | Disposition: A | Discharge status: Home / Self Care | Payer: Medicare Other | Source: Ambulatory Visit | Attending: Podiatry | Admitting: Podiatry

## 2021-01-18 DIAGNOSIS — Z20822 Contact with and (suspected) exposure to covid-19: Secondary | ICD-10-CM | POA: Diagnosis not present

## 2021-01-18 DIAGNOSIS — Z01812 Encounter for preprocedural laboratory examination: Secondary | ICD-10-CM | POA: Insufficient documentation

## 2021-01-18 LAB — SARS CORONAVIRUS 2 (TAT 6-24 HRS): SARS Coronavirus 2: NEGATIVE

## 2021-01-19 ENCOUNTER — Ambulatory Visit
Admission: RE | Admit: 2021-01-19 | Discharge: 2021-01-19 | Disposition: A | Discharge status: Home / Self Care | Payer: Medicare Other | Source: Ambulatory Visit | Attending: Podiatry | Admitting: Podiatry

## 2021-01-19 ENCOUNTER — Other Ambulatory Visit: Payer: Self-pay | Admitting: Podiatry

## 2021-01-19 ENCOUNTER — Other Ambulatory Visit: Payer: Self-pay

## 2021-01-19 DIAGNOSIS — M86171 Other acute osteomyelitis, right ankle and foot: Secondary | ICD-10-CM

## 2021-01-19 IMAGING — MR MR HEEL *L* W/O CM
4 of 5 series · 15 of 40 positions shown · non-contrast
Comparison: Plain films left foot [DATE].

CLINICAL DATA: Diabetic patient with a skin ulceration on the left
heel. History of prior amputation.

EXAM:
MR OF THE LEFT HEEL WITHOUT CONTRAST
TECHNIQUE: Multiplanar, multisequence MR imaging of the left ankle/heel was
performed. No intravenous contrast was administered.

[Series 3: PD fat-sat · axial · left · 3.0mm · 0.33mm/px · z∈[-70,+51]mm · 6 of 38 slices shown]
[im 1/38]
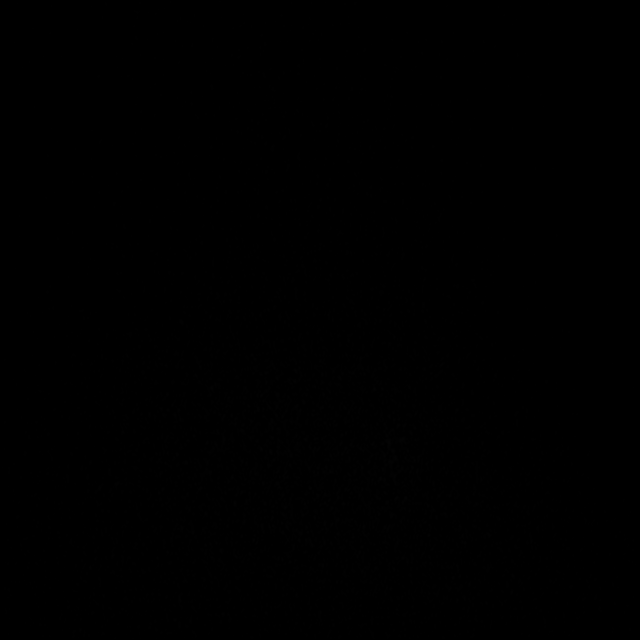
[im 6/38]
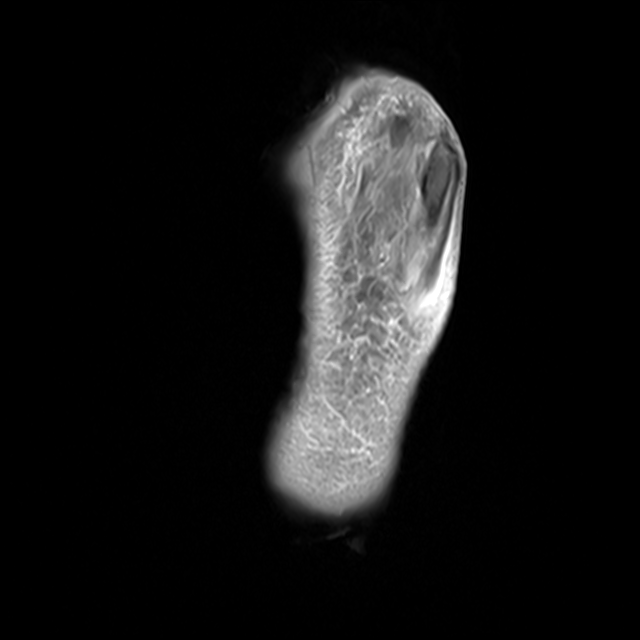
[im 11/38]
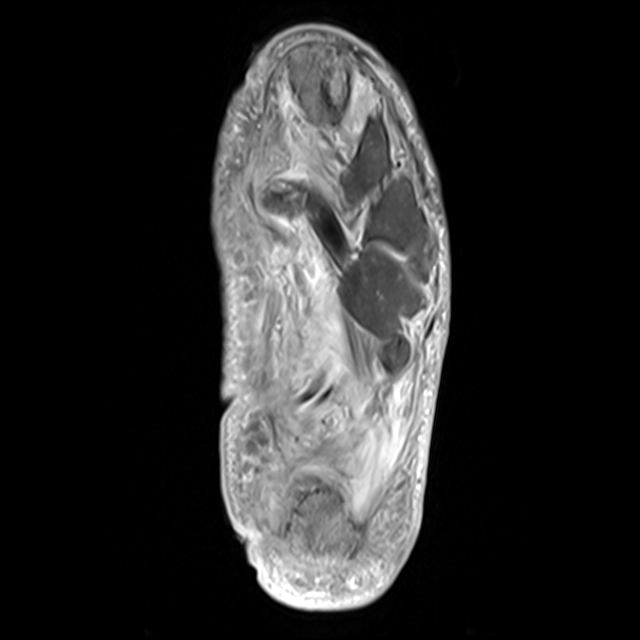
[im 16/38]
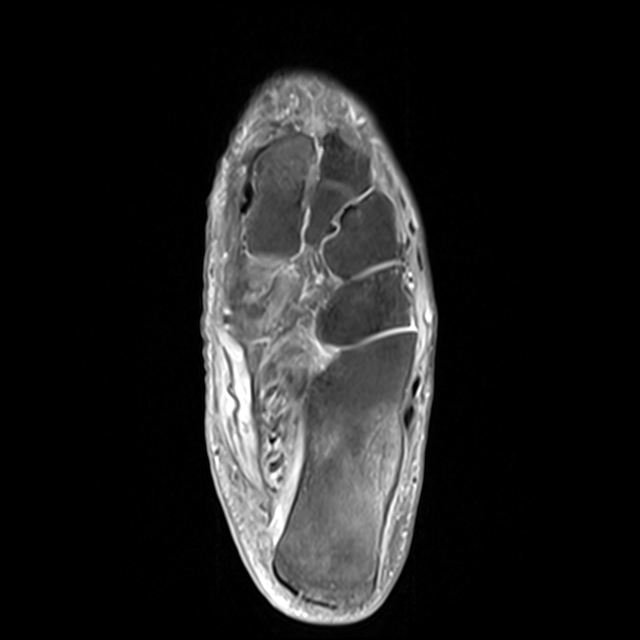
[im 22/38]
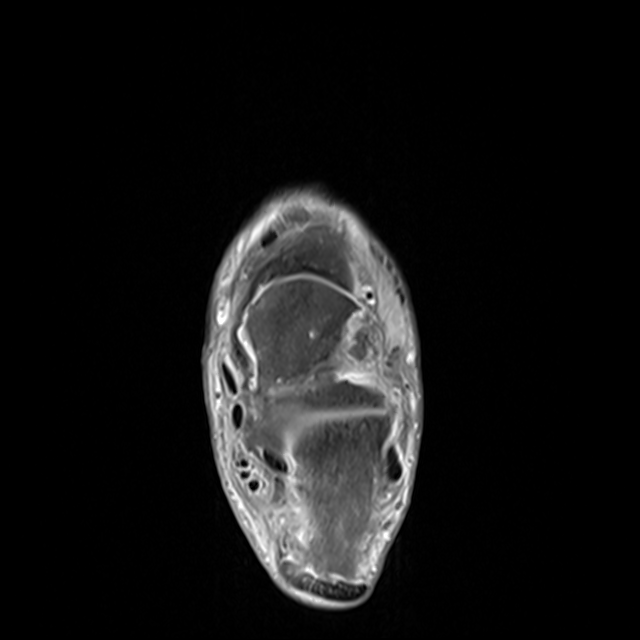
[im 32/38]
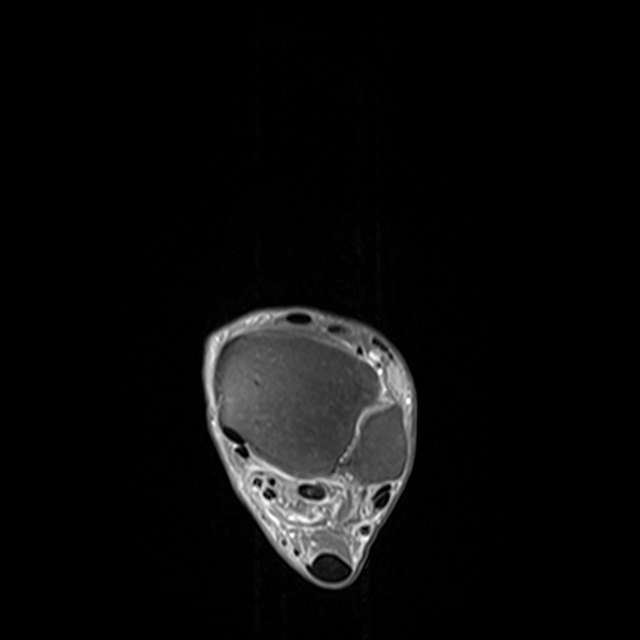

[Series 4: T2 fat-sat · axial · left · 3.0mm · 0.33mm/px · z∈[-54,+55]mm · 3 of 38 slices shown (1 of 2)]
[im 5/38]
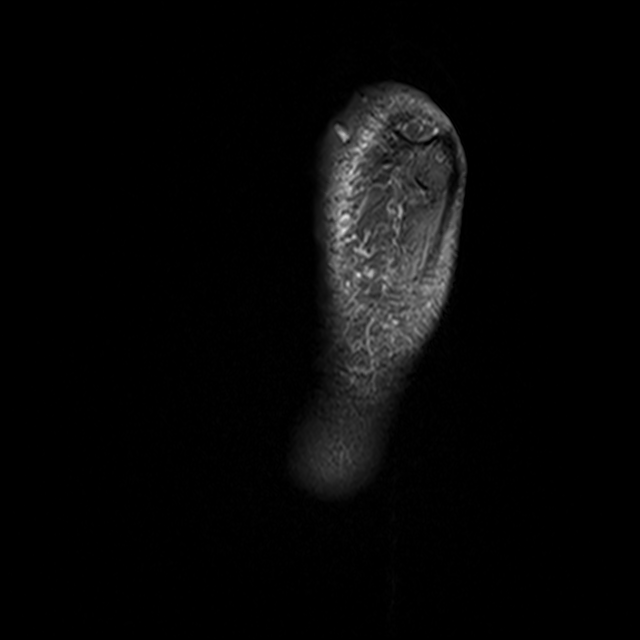
[im 19/38]
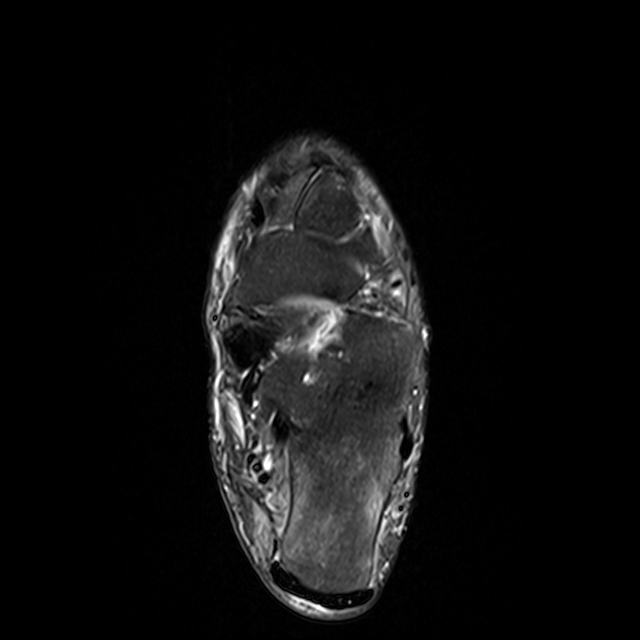
[im 33/38]
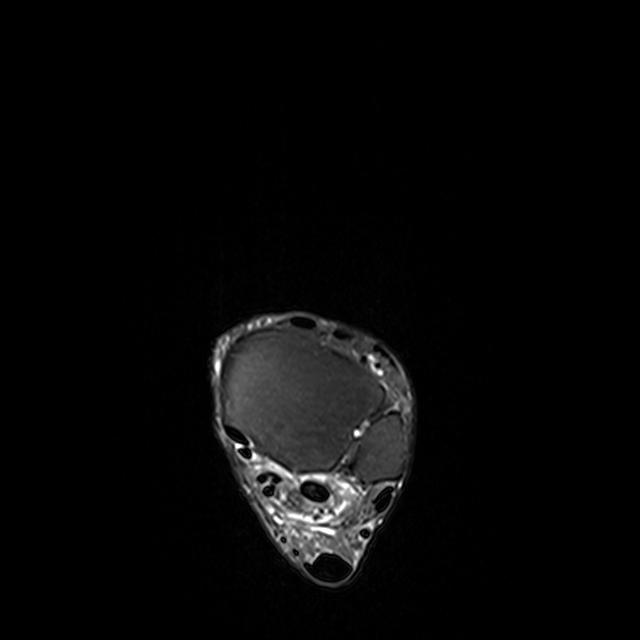

[Series 5: T1 · sagittal · left · 4.0mm · 0.41mm/px · 3 of 24 slices shown]
[im 5/24]
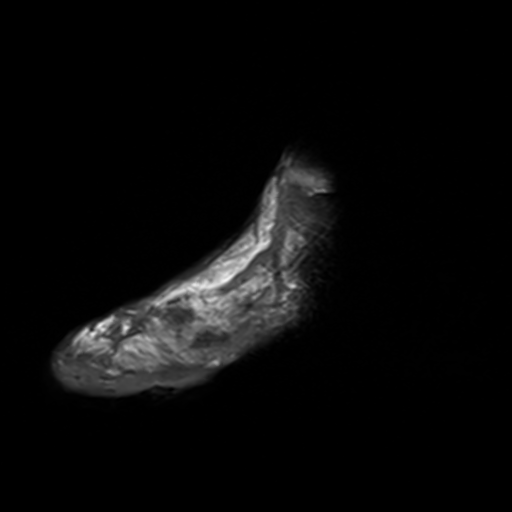
[im 14/24]
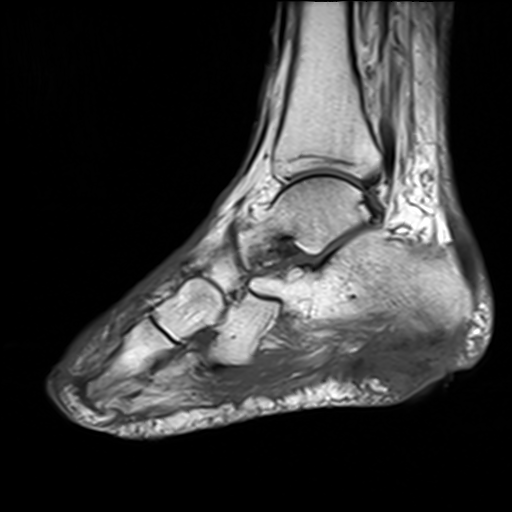
[im 24/24]
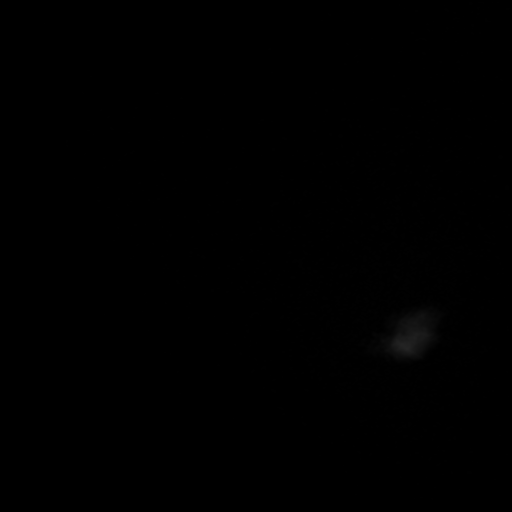

[Series 7: T2 fat-sat · coronal · left · 3.0mm · 0.31mm/px · 3 of 50 slices shown (2 of 2)]
[im 9/50]
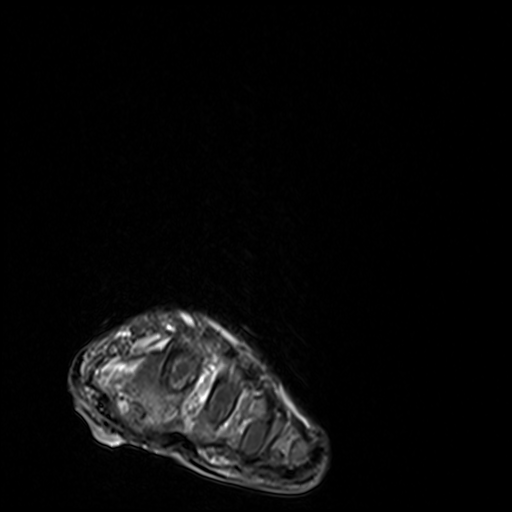
[im 27/50]
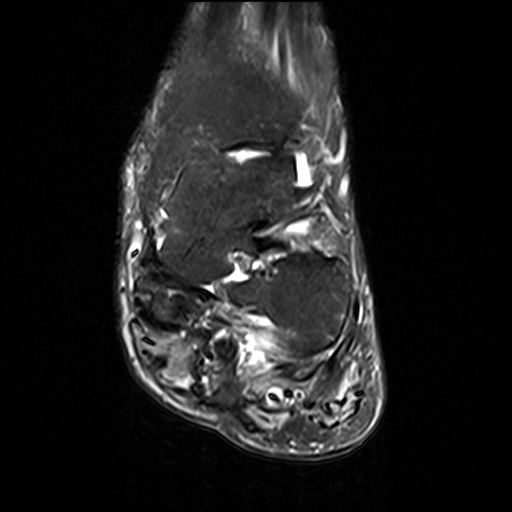
[im 45/50]
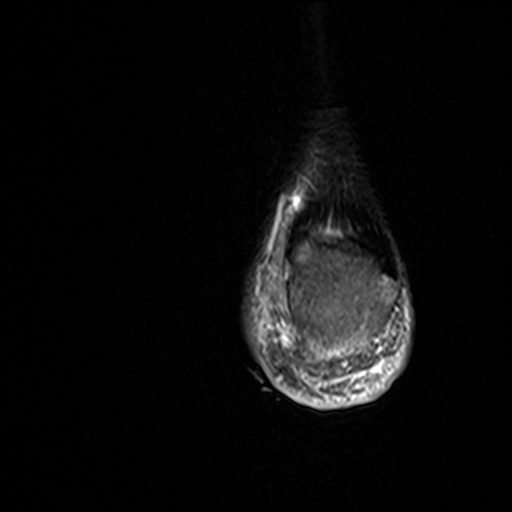

[15 of 40 positions shown; findings below may reference images not displayed]

FINDINGS: Bones/Joint/Cartilage

Again seen is postoperative change of amputation of the entire first
ray and transmetatarsal amputation of the second through fifth
metatarsals.

There is marrow edema in the posterior and inferior calcaneus
subjacent to a skin wound consistent with osteomyelitis. Most
intense edema is deep to the skin wound extending approximately
cm AP x 2.6 cm craniocaudal and involves the entire transverse
diameter of the calcaneus.

Also seen is a focus of marrow edema in the inferior, lateral aspect
of the medial cuneiform measuring approximately 1.4 cm long by
cm craniocaudal by 0.7 cm transverse. There is also marrow edema in
the distal 2.2 cm of the remnant of the second metatarsal. Cortical
thickening along the medial margin of the second metatarsal remnant
is noted as seen on prior plain films.

Ligaments

Negative.

Muscles and Tendons

No intramuscular fluid collection. There is atrophy of intrinsic
musculature of the foot.

Soft tissues

No abscess is identified. Skin wound on the heel is noted. There
appears to be a second skin wound subjacent to the stump of the
second metatarsal.
IMPRESSION: Osteomyelitis in the posterior and inferior calcaneus as described
above.

Marrow edema in the distal 2.2 cm of the second metatarsal stump and
inferior, lateral aspect of the medial cuneiform is worrisome for
osteomyelitis. There appears to be a skin wound overlying the second
metatarsal stump.

Negative for abscess or septic joint.

## 2021-01-22 ENCOUNTER — Other Ambulatory Visit: Payer: Self-pay

## 2021-01-22 ENCOUNTER — Encounter (HOSPITAL_BASED_OUTPATIENT_CLINIC_OR_DEPARTMENT_OTHER)
Admission: RE | Disposition: A | Discharge status: Home / Self Care | Payer: Self-pay | Source: Home / Self Care | Attending: Podiatry

## 2021-01-22 ENCOUNTER — Encounter (HOSPITAL_BASED_OUTPATIENT_CLINIC_OR_DEPARTMENT_OTHER): Payer: Self-pay | Admitting: Podiatry

## 2021-01-22 ENCOUNTER — Ambulatory Visit (HOSPITAL_BASED_OUTPATIENT_CLINIC_OR_DEPARTMENT_OTHER): Payer: Medicare Other | Admitting: Anesthesiology

## 2021-01-22 ENCOUNTER — Encounter: Payer: Self-pay | Admitting: Podiatry

## 2021-01-22 ENCOUNTER — Ambulatory Visit (HOSPITAL_BASED_OUTPATIENT_CLINIC_OR_DEPARTMENT_OTHER)
Admission: RE | Admit: 2021-01-22 | Discharge: 2021-01-22 | Disposition: A | Discharge status: Home / Self Care | Payer: Medicare Other | Attending: Podiatry | Admitting: Podiatry

## 2021-01-22 DIAGNOSIS — L97422 Non-pressure chronic ulcer of left heel and midfoot with fat layer exposed: Secondary | ICD-10-CM | POA: Insufficient documentation

## 2021-01-22 DIAGNOSIS — Z89432 Acquired absence of left foot: Secondary | ICD-10-CM | POA: Diagnosis not present

## 2021-01-22 DIAGNOSIS — L97424 Non-pressure chronic ulcer of left heel and midfoot with necrosis of bone: Secondary | ICD-10-CM | POA: Diagnosis not present

## 2021-01-22 DIAGNOSIS — E11621 Type 2 diabetes mellitus with foot ulcer: Secondary | ICD-10-CM | POA: Diagnosis not present

## 2021-01-22 DIAGNOSIS — L97423 Non-pressure chronic ulcer of left heel and midfoot with necrosis of muscle: Secondary | ICD-10-CM

## 2021-01-22 DIAGNOSIS — Z79899 Other long term (current) drug therapy: Secondary | ICD-10-CM | POA: Insufficient documentation

## 2021-01-22 DIAGNOSIS — L97524 Non-pressure chronic ulcer of other part of left foot with necrosis of bone: Secondary | ICD-10-CM | POA: Diagnosis not present

## 2021-01-22 HISTORY — PX: IRRIGATION AND DEBRIDEMENT FOOT: SHX6602

## 2021-01-22 LAB — POCT I-STAT, CHEM 8
BUN: 15 mg/dL (ref 6–20)
Calcium, Ion: 1.21 mmol/L (ref 1.15–1.40)
Chloride: 102 mmol/L (ref 98–111)
Creatinine, Ser: 0.7 mg/dL (ref 0.61–1.24)
Glucose, Bld: 100 mg/dL — ABNORMAL HIGH (ref 70–99)
HCT: 46 % (ref 39.0–52.0)
Hemoglobin: 15.6 g/dL (ref 13.0–17.0)
Potassium: 3.5 mmol/L (ref 3.5–5.1)
Sodium: 139 mmol/L (ref 135–145)
TCO2: 26 mmol/L (ref 22–32)

## 2021-01-22 LAB — GLUCOSE, CAPILLARY: Glucose-Capillary: 107 mg/dL — ABNORMAL HIGH (ref 70–99)

## 2021-01-22 SURGERY — IRRIGATION AND DEBRIDEMENT FOOT
Anesthesia: Monitor Anesthesia Care | Site: Foot | Laterality: Left

## 2021-01-22 MED ORDER — EPHEDRINE SULFATE 50 MG/ML IJ SOLN
INTRAMUSCULAR | Status: DC | PRN
  Administered 2021-01-22: 15 mg via INTRAVENOUS

## 2021-01-22 MED ORDER — ACETAMINOPHEN 500 MG PO TABS
ORAL_TABLET | ORAL | Status: AC
  Filled 2021-01-22: qty 2

## 2021-01-22 MED ORDER — ONDANSETRON HCL 4 MG/2ML IJ SOLN
INTRAMUSCULAR | Status: DC | PRN
  Administered 2021-01-22: 4 mg via INTRAVENOUS

## 2021-01-22 MED ORDER — OXYCODONE-ACETAMINOPHEN 10-325 MG PO TABS: 1.0000 | ORAL_TABLET | ORAL | 0 refills | Status: DC | PRN

## 2021-01-22 MED ORDER — BUPIVACAINE HCL (PF) 0.5 % IJ SOLN
INTRAMUSCULAR | Status: DC | PRN
  Administered 2021-01-22: 20 mL

## 2021-01-22 MED ORDER — ACETAMINOPHEN 500 MG PO TABS
1000.0000 mg | ORAL_TABLET | Freq: Once | ORAL | Status: AC
  Administered 2021-01-22: 1000 mg via ORAL

## 2021-01-22 MED ORDER — FENTANYL CITRATE (PF) 100 MCG/2ML IJ SOLN
INTRAMUSCULAR | Status: AC
  Filled 2021-01-22: qty 2

## 2021-01-22 MED ORDER — MIDAZOLAM HCL 2 MG/2ML IJ SOLN
INTRAMUSCULAR | Status: AC
  Filled 2021-01-22: qty 2

## 2021-01-22 MED ORDER — CLINDAMYCIN HCL 300 MG PO CAPS: 300.0000 mg | ORAL_CAPSULE | Freq: Two times a day (BID) | ORAL | 0 refills | Status: DC

## 2021-01-22 MED ORDER — FENTANYL CITRATE (PF) 100 MCG/2ML IJ SOLN
INTRAMUSCULAR | Status: DC | PRN
  Administered 2021-01-22: 25 ug via INTRAVENOUS
  Administered 2021-01-22: 50 ug via INTRAVENOUS
  Administered 2021-01-22: 25 ug via INTRAVENOUS

## 2021-01-22 MED ORDER — FENTANYL CITRATE (PF) 100 MCG/2ML IJ SOLN
25.0000 ug | INTRAMUSCULAR | Status: DC | PRN
Start: 2021-01-22 — End: 2021-01-22
  Administered 2021-01-22 (×2): 50 ug via INTRAVENOUS

## 2021-01-22 MED ORDER — MIDAZOLAM HCL 5 MG/5ML IJ SOLN
INTRAMUSCULAR | Status: DC | PRN
  Administered 2021-01-22: 2 mg via INTRAVENOUS

## 2021-01-22 MED ORDER — LIDOCAINE HCL (CARDIAC) PF 100 MG/5ML IV SOSY
PREFILLED_SYRINGE | INTRAVENOUS | Status: DC | PRN
  Administered 2021-01-22: 20 mg via INTRAVENOUS
  Administered 2021-01-22: 80 mg via INTRAVENOUS

## 2021-01-22 MED ORDER — CEFAZOLIN SODIUM-DEXTROSE 2-4 GM/100ML-% IV SOLN
INTRAVENOUS | Status: AC
  Filled 2021-01-22: qty 100

## 2021-01-22 MED ORDER — ONDANSETRON HCL 4 MG/2ML IJ SOLN
INTRAMUSCULAR | Status: AC
  Filled 2021-01-22: qty 2

## 2021-01-22 MED ORDER — LIDOCAINE 2% (20 MG/ML) 5 ML SYRINGE
INTRAMUSCULAR | Status: AC
  Filled 2021-01-22: qty 5

## 2021-01-22 MED ORDER — CEFAZOLIN SODIUM-DEXTROSE 2-3 GM-%(50ML) IV SOLR
INTRAVENOUS | Status: DC | PRN
  Administered 2021-01-22: 2 g via INTRAVENOUS

## 2021-01-22 MED ORDER — PROPOFOL 500 MG/50ML IV EMUL
INTRAVENOUS | Status: DC | PRN
  Administered 2021-01-22: 100 ug/kg/min via INTRAVENOUS

## 2021-01-22 MED ORDER — PROPOFOL 500 MG/50ML IV EMUL
INTRAVENOUS | Status: AC
  Filled 2021-01-22: qty 50

## 2021-01-22 MED ORDER — LACTATED RINGERS IV SOLN: INTRAVENOUS | Status: DC

## 2021-01-22 MED ORDER — AMISULPRIDE (ANTIEMETIC) 5 MG/2ML IV SOLN: 10.0000 mg | Freq: Once | INTRAVENOUS | Status: DC | PRN

## 2021-01-22 MED ORDER — EPHEDRINE 5 MG/ML INJ
INTRAVENOUS | Status: AC
  Filled 2021-01-22: qty 10

## 2021-01-22 SURGICAL SUPPLY — 65 items
APL PRP STRL LF DISP 70% ISPRP (MISCELLANEOUS)
BLADE SURG 10 STRL SS (BLADE) ×4 IMPLANT
BLADE SURG 15 STRL LF DISP TIS (BLADE) ×1 IMPLANT
BLADE SURG 15 STRL SS (BLADE) ×2
BNDG CMPR 9X4 STRL LF SNTH (GAUZE/BANDAGES/DRESSINGS)
BNDG ELASTIC 3X5.8 VLCR STR LF (GAUZE/BANDAGES/DRESSINGS) ×2 IMPLANT
BNDG ELASTIC 4X5.8 VLCR STR LF (GAUZE/BANDAGES/DRESSINGS) ×2 IMPLANT
BNDG ESMARK 4X9 LF (GAUZE/BANDAGES/DRESSINGS) IMPLANT
BNDG GAUZE ELAST 4 BULKY (GAUZE/BANDAGES/DRESSINGS) ×2 IMPLANT
CHLORAPREP W/TINT 26 (MISCELLANEOUS) IMPLANT
CNTNR URN SCR LID CUP LEK RST (MISCELLANEOUS) ×1 IMPLANT
CONT SPEC 4OZ STRL OR WHT (MISCELLANEOUS) ×2
COVER BACK TABLE 60X90IN (DRAPES) ×2 IMPLANT
COVER WAND RF STERILE (DRAPES) IMPLANT
CUFF TOURN SGL QUICK 18X4 (TOURNIQUET CUFF) IMPLANT
CUFF TOURN SGL QUICK 24 (TOURNIQUET CUFF)
CUFF TRNQT CYL 24X4X16.5-23 (TOURNIQUET CUFF) IMPLANT
DRAPE 3/4 80X56 (DRAPES) IMPLANT
DRAPE EXTREMITY T 121X128X90 (DISPOSABLE) ×2 IMPLANT
DRAPE SHEET LG 3/4 BI-LAMINATE (DRAPES) ×2 IMPLANT
DRAPE U-SHAPE 47X51 STRL (DRAPES) IMPLANT
DRSG ADAPTIC 3X8 NADH LF (GAUZE/BANDAGES/DRESSINGS) ×2 IMPLANT
ELECT REM PT RETURN 9FT ADLT (ELECTROSURGICAL)
ELECTRODE REM PT RTRN 9FT ADLT (ELECTROSURGICAL) IMPLANT
GAUZE SPONGE 4X4 12PLY STRL (GAUZE/BANDAGES/DRESSINGS) ×2 IMPLANT
GAUZE XEROFORM 1X8 LF (GAUZE/BANDAGES/DRESSINGS) IMPLANT
GLOVE SRG 8 PF TXTR STRL LF DI (GLOVE) ×1 IMPLANT
GLOVE SURG ENC MOIS LTX SZ7.5 (GLOVE) ×2 IMPLANT
GLOVE SURG UNDER POLY LF SZ8 (GLOVE) ×2
GOWN STRL REUS W/TWL XL LVL3 (GOWN DISPOSABLE) ×2 IMPLANT
IV NS 1000ML (IV SOLUTION) ×2
IV NS 1000ML BAXH (IV SOLUTION) ×1 IMPLANT
KIT TURNOVER CYSTO (KITS) ×2 IMPLANT
MANIFOLD NEPTUNE II (INSTRUMENTS) ×2 IMPLANT
MATRIX WOUND MESHED 2X2 (Tissue) ×1 IMPLANT
NEEDLE HYPO 22GX1.5 SAFETY (NEEDLE) ×2 IMPLANT
NEEDLE HYPO 25X1 1.5 SAFETY (NEEDLE) ×2 IMPLANT
NS IRRIG 1000ML POUR BTL (IV SOLUTION) IMPLANT
NS IRRIG 500ML POUR BTL (IV SOLUTION) ×2 IMPLANT
PACK BASIN DAY SURGERY FS (CUSTOM PROCEDURE TRAY) ×2 IMPLANT
PADDING CAST ABS 4INX4YD NS (CAST SUPPLIES)
PADDING CAST ABS COTTON 4X4 ST (CAST SUPPLIES) IMPLANT
PENCIL SMOKE EVAC W/HOLSTER (ELECTROSURGICAL) IMPLANT
PROBE DEBRIDE SONICVAC MISONIX (TIP) ×2 IMPLANT
SET IRRIG Y TYPE TUR BLADDER L (SET/KITS/TRAYS/PACK) IMPLANT
SPONGE LAP 4X18 RFD (DISPOSABLE) IMPLANT
STAPLER VISISTAT 35W (STAPLE) ×2 IMPLANT
STOCKINETTE 6  STRL (DRAPES) ×1
STOCKINETTE 6 STRL (DRAPES) ×1 IMPLANT
SUCTION FRAZIER HANDLE 10FR (MISCELLANEOUS)
SUCTION TUBE FRAZIER 10FR DISP (MISCELLANEOUS) IMPLANT
SUT ETHILON 2 0 FS 18 (SUTURE) ×6 IMPLANT
SUT ETHILON 3 0 PS 1 (SUTURE) IMPLANT
SUT ETHILON 4 0 PS 2 18 (SUTURE) IMPLANT
SUT MNCRL AB 3-0 PS2 18 (SUTURE) IMPLANT
SUT MNCRL AB 4-0 PS2 18 (SUTURE) IMPLANT
SUT VIC AB 2-0 SH 27 (SUTURE)
SUT VIC AB 2-0 SH 27XBRD (SUTURE) IMPLANT
SYR BULB EAR ULCER 3OZ GRN STR (SYRINGE) ×2 IMPLANT
SYR CONTROL 10ML LL (SYRINGE) ×4 IMPLANT
TRAY DSU PREP LF (CUSTOM PROCEDURE TRAY) ×2 IMPLANT
TUBE IRRIGATION SET MISONIX (TUBING) ×2 IMPLANT
UNDERPAD 30X36 HEAVY ABSORB (UNDERPADS AND DIAPERS) ×4 IMPLANT
WOUND MATRIX MESHED 2X2 (Tissue) ×1 IMPLANT
YANKAUER SUCT BULB TIP NO VENT (SUCTIONS) ×2 IMPLANT

## 2021-01-22 NOTE — Progress Notes (Signed)
Completed at 1615

## 2021-01-22 NOTE — Anesthesia Preprocedure Evaluation (Signed)
Anesthesia Evaluation  Patient identified by MRN, date of birth, ID band Patient awake    Reviewed: Allergy & Precautions, NPO status , Patient's Chart, lab work & pertinent test results  History of Anesthesia Complications Negative for: history of anesthetic complications  Airway Mallampati: II  TM Distance: >3 FB Neck ROM: Full    Dental  (+) Teeth Intact, Dental Advisory Given   Pulmonary Current SmokerPatient did not abstain from smoking.,  <1ppd, no inhalers   Pulmonary exam normal breath sounds clear to auscultation       Cardiovascular hypertension, Pt. on medications Normal cardiovascular exam+ dysrhythmias  Rhythm:Regular Rate:Normal     Neuro/Psych PSYCHIATRIC DISORDERS Anxiety Depression Diabetic peripheral neuropathy  Neuromuscular disease    GI/Hepatic Neg liver ROS, GERD  Medicated and Controlled,  Endo/Other  diabetes, Well Controlled, Type 2, Insulin Dependent  Renal/GU   negative genitourinary   Musculoskeletal  (+) Arthritis , Osteoarthritis,  Chronic ulcer left heel 2/2 T2DM   Abdominal   Peds  Hematology negative hematology ROS (+)   Anesthesia Other Findings   Reproductive/Obstetrics negative OB ROS                             Anesthesia Physical  Anesthesia Plan  ASA: III  Anesthesia Plan: MAC   Post-op Pain Management:    Induction:   PONV Risk Score and Plan: 1 and Treatment may vary due to age or medical condition, Propofol infusion and TIVA  Airway Management Planned: Natural Airway, Nasal Cannula and Simple Face Mask  Additional Equipment: None  Intra-op Plan:   Post-operative Plan:   Informed Consent: I have reviewed the patients History and Physical, chart, labs and discussed the procedure including the risks, benefits and alternatives for the proposed anesthesia with the patient or authorized representative who has indicated his/her  understanding and acceptance.     Dental advisory given  Plan Discussed with: Anesthesiologist and CRNA  Anesthesia Plan Comments:         Anesthesia Quick Evaluation

## 2021-01-22 NOTE — Interval H&P Note (Signed)
History and Physical Interval Note:  01/22/2021 1:37 PM  Charles Thompson  has presented today for surgery, with the diagnosis of Ulcers of left foot.  The various methods of treatment have been discussed with the patient and family. After consideration of risks, benefits and other options for treatment, the patient has consented to  Procedure(s): Left foot debridement of wounds, possible calcaneal bone biopsy, possible skin graft substitute (Left) as a surgical intervention.  The patient's history has been reviewed, patient examined, no change in status, stable for surgery.  I have reviewed the patient's chart and labs.  Questions were answered to the patient's satisfaction.     Park Liter

## 2021-01-22 NOTE — Discharge Instructions (Signed)
After Surgery Instructions   1) If you are recuperating from surgery anywhere other than home, please be sure to leave [place] the number where you can be reached.  2) Go directly home and rest.  3) Keep the operated foot(feet) elevated six inches above the hip when sitting or lying down. This will help control swelling and pain.  4) Support the elevated foot and leg with pillows. DO NOT PLACE PILLOWS UNDER THE KNEE.  5) DO NOT REMOVE or get your bandages WET, unless you were given different instructions by your doctor to do so. This increases the risk of infection.  6) Wear your surgical shoe or surgical boot at all times when you are up on your feet.  7) A limited amount of pain and swelling may occur. The skin may take on a bruised appearance. DO NOT BE ALARMED, THIS IS NORMAL.  8) For slight pain and swelling, apply an ice pack directly over the bandages for 15 minutes only out of each hour of the day. Continue until seen in the office for your first post op visit. DO NOT APPLY ANY FORM OF HEAT TO THE AREA.  9) Have prescriptions filled immediately and take as directed.  10) Drink lots of liquids, water and juice to stay hydrated.  11) CALL IMMEDIATELY IF:  *Bleeding continues until the following day of surgery  *Pain increases and/or does not respond to medication  *Bandages or cast appears to tight  *If your bandage gets wet  *Trip, fall or stump your surgical foot  *If your temperature goes above 101  *If you have ANY questions at all  12) You are expected to be weightbearing after your surgery.   If you need to reach the nurse for any reason, please call: [place]: [phone] [place]: [phone] [place]: [phone]   Surgical Wound Debridement, Care After This sheet gives you information about how to care for yourself after your procedure. Your health care provider may also give you more specific instructions. If you have problems or  questions, contact your health care provider. What can I expect after the procedure? After the procedure, it is common to have:  Pain or soreness.  Fluid that leaks from the wound.  Stiffness.  A larger wound. This is because the dead tissue has been removed. Follow these instructions at home: Medicines  Take over-the-counter and prescription medicines only as told by your health care provider.  If you were prescribed an antibiotic medicine, take it or apply it as told by your health care provider. Do not stop using the antibiotic even if you start to feel better. Wound care  Follow instructions from your health care provider about how to take care of your wound. Do not change your dressing. If it becomes soiled or needs changing due to drainage call Dr. Sherlene Shams office. ?  ?  ?    Watch for: ? More redness, swelling, or pain. ? More fluid or blood. ? Warmth. ? Pus. ? A bad smell coming from your wound even after you clean it.  Do not take baths, swim, or use a hot tub until your health care provider approves. Ask your health care provider if you may take showers. You may only be allowed to take sponge baths. Activity  Do not drive or use heavy machinery while taking prescription pain medicine.  Return to your normal activities as told by your health care provider. Ask your health care provider what activities are safe for you.  General instructions  Eat a healthy diet with lots of protein such as meats, cheese, nuts and beans. Ask your health care provider to suggest the best diet for you.  Do not use any products that contain nicotine or tobacco, such as cigarettes, e-cigarettes, and chewing tobacco. These can delay wound healing after surgery. If you need help quitting, ask your health care provider.  Keep all follow-up visits as told by your health care provider. This is important.      Contact a health care provider if:  You have a fever.  Your pain medicine is not  helping.  Your wound is more red and swollen.  You have increased bleeding.  You have pus coming from your wound.  You have a bad smell coming from your wound.  Your wound is not getting better after 1-2 weeks of treatment.  You develop a new medical condition, such as diabetes, peripheral vascular disease, or conditions that affect your defense system (immune system). Summary  After the procedure, it is common to have pain, soreness, stiffness, or fluid leaking from the wound.  Follow instructions from your health care provider about how to take care of your wound.    Eat a healthy diet with lots of protein. Ask your health care provider to suggest the best diet for you.  Contact a health care provider if you have fever, more swelling and redness, increased bleeding, or a bad smell from the wound. This information is not intended to replace advice given to you by your health care provider. Make sure you discuss any questions you have with your health care provider. Document Revised: 10/25/2018 Document Reviewed: 10/25/2018 Elsevier Patient Education  2021 Elsevier Inc.    Post Anesthesia Home Care Instructions  Activity: Get plenty of rest for the remainder of the day. A responsible individual must stay with you for 24 hours following the procedure.  For the next 24 hours, DO NOT: -Drive a car -Advertising copywriter -Drink alcoholic beverages -Take any medication unless instructed by your physician -Make any legal decisions or sign important papers.  Meals: Start with liquid foods such as gelatin or soup. Progress to regular foods as tolerated. Avoid greasy, spicy, heavy foods. If nausea and/or vomiting occur, drink only clear liquids until the nausea and/or vomiting subsides. Call your physician if vomiting continues.  Special Instructions/Symptoms: Your throat may feel dry or sore from the anesthesia or the breathing tube placed in your throat during surgery. If this  causes discomfort, gargle with warm salt water. The discomfort should disappear within 24 hours.  If you had a scopolamine patch placed behind your ear for the management of post- operative nausea and/or vomiting:  1. The medication in the patch is effective for 72 hours, after which it should be removed.  Wrap patch in a tissue and discard in the trash. Wash hands thoroughly with soap and water. 2. You may remove the patch earlier than 72 hours if you experience unpleasant side effects which may include dry mouth, dizziness or visual disturbances. 3. Avoid touching the patch. Wash your hands with soap and water after contact with the patch.

## 2021-01-22 NOTE — Transfer of Care (Signed)
Immediate Anesthesia Transfer of Care Note  Patient: Charles Thompson  Procedure(s) Performed: Procedure(s) (LRB): Left foot debridement of wounds, possible calcaneal bone biopsy, possible skin graft substitute (Left)  Patient Location: PACU  Anesthesia Type: MAC  Level of Consciousness: awake, alert  and oriented  Airway & Oxygen Therapy: Patient Spontanous Breathing and Patient connected to face mask oxygen  Post-op Assessment: Report given to PACU RN and Post -op Vital signs reviewed and stable  Post vital signs: Reviewed and stable  Complications: No apparent anesthesia complications Last Vitals:  Vitals Value Taken Time  BP 101/60 01/22/21 1452  Temp    Pulse 81 01/22/21 1459  Resp 19 01/22/21 1459  SpO2 96 % 01/22/21 1459  Vitals shown include unvalidated device data.  Last Pain:  Vitals:   01/22/21 1314  TempSrc: Oral  PainSc: 0-No pain      Patients Stated Pain Goal: 2 (88/32/54 9826)  Complications: No complications documented.

## 2021-01-22 NOTE — Op Note (Signed)
Patient Name: Charles Thompson DOB: [birth year]  MRN: [id number]   Date of Service: 01/22/21   Surgeon: Dr. Hardie Pulley, DPM Assistants: None Pre-operative Diagnosis: Ulcer of heel, ulcer of midfoot, osteomyelitis Post-operative Diagnosis: same Procedures:             1) Debridement of wound to fascia with partial closure  2) Debridement of wound to bone with wound closure  3) Bone biopsy  4) Application of skin graft substitute  Pathology/Specimens: ID Type Source Tests Collected by Time Destination  1 : Left calcaneous bone Tissue Wound SURGICAL PATHOLOGY Evelina Bucy, DPM 01/22/2021 1431   A : Left calcaneous bone Tissue Wound AEROBIC/ANAEROBIC CULTURE W GRAM STAIN (SURGICAL/DEEP WOUND) Evelina Bucy, DPM 01/22/2021 1433    Anesthesia: MAC Hemostasis: Anatomic Estimated Blood Loss: 10 ml Materials:  Implant Name Type Inv. Item Serial No. Manufacturer Lot No. LRB No. Used Action  Integra Meshed Bilayer Wound Matrix    [employer] [id number] Left 1 Implanted    Medications: 20 ml 0.5% marcaine plain Complications: None  Indications for Procedure:  This is a 60 y.o. male with chronic wounds to the right foot s/p Incision and drainage for infection. Recently he had new onset pain to his heel and there was concern for ostoemyelitis. MRI also suggestive of OM. He presents for debridement and bone biopsy.   Procedure in Detail: Patient was identified in pre-operative holding area. Formal consent was signed and the left lower extremity was marked. Patient was brought back to the operating room and placed on the operating room table in the supine position. Anesthesia was induced.   The extremity was prepped and draped in the usual sterile fashion. Timeout was taken to confirm patient name, laterality, and procedure prior to incision. Attention was then directed to the left foot where two wounds were noted. The plantar wound measured 5x2, and the heel measuring 5x0.5. The heel  wound did probe to bone.  The wound was sharply excisionally debrided with a 15 blade, followed by a misonix ultrasonic debrider. Debridement was performed to bleeding, viable wound base.   A rongeur was used to take a sample of the calcaneal bone for microbiology and pathology. The bone did appear rather firm and in normal coloration.  The heel wound was debrided to the level of the bone. Following debridement the wound measured 6x2.  The distal wound was debrided to the level of the fascia. Following debridement the wound measured 5x2.5. Retention sutures were placed in the distal wound to bring the wound edges closer together. The heel was able to essentially closed, leaving only an open area of 2.5x1. Both were sutured with 2-0 nlyon.  Integra bilayer was rehydrated in saline and placed on the open areas of both the distal and heel wound. This was adhered with staples. This was covered with adaptic, 4x4, kerlix, and ACE bandage.   Disposition: Following a period of post-operative monitoring, patient will be transferred back home. We will monitor cultures and plan for antibiotic therapy accordingly. He may need long term IV abx. Will likely need consult to infectious disease.

## 2021-01-22 NOTE — Anesthesia Procedure Notes (Addendum)
Procedure Name: MAC Date/Time: 01/22/2021 2:14 PM Performed by: Mechele Claude, CRNA Pre-anesthesia Checklist: Patient identified, Emergency Drugs available, Suction available and Patient being monitored Oxygen Delivery Method: Simple face mask Airway Equipment and Method: Oral airway Placement Confirmation: positive ETCO2 and breath sounds checked- equal and bilateral

## 2021-01-23 ENCOUNTER — Encounter (HOSPITAL_BASED_OUTPATIENT_CLINIC_OR_DEPARTMENT_OTHER): Payer: Self-pay | Admitting: Podiatry

## 2021-01-23 NOTE — Anesthesia Postprocedure Evaluation (Signed)
Anesthesia Post Note  Patient: Charles Thompson  Procedure(s) Performed: Left foot debridement of wounds, calcaneal bone biopsy, skin graft substitute (Left Foot)     Patient location during evaluation: PACU Anesthesia Type: MAC Level of consciousness: awake and alert Pain management: pain level controlled Vital Signs Assessment: post-procedure vital signs reviewed and stable Respiratory status: spontaneous breathing, nonlabored ventilation, respiratory function stable and patient connected to nasal cannula oxygen Cardiovascular status: stable and blood pressure returned to baseline Postop Assessment: no apparent nausea or vomiting Anesthetic complications: no   No complications documented.  Last Vitals:  Vitals:   01/22/21 1545 01/22/21 1620  BP: 121/71 121/68  Pulse: (!) 52 (!) 52  Resp: 14 14  Temp: 36.4 C (!) 36.4 C  SpO2: 96% 97%    Last Pain:  Vitals:   01/22/21 1620  TempSrc:   PainSc: 2                  Tiajuana Amass

## 2021-01-25 ENCOUNTER — Other Ambulatory Visit: Payer: Self-pay | Admitting: Podiatry

## 2021-01-25 DIAGNOSIS — M86171 Other acute osteomyelitis, right ankle and foot: Secondary | ICD-10-CM

## 2021-01-27 LAB — AEROBIC/ANAEROBIC CULTURE W GRAM STAIN (SURGICAL/DEEP WOUND): Gram Stain: NONE SEEN

## 2021-01-27 LAB — SURGICAL PATHOLOGY

## 2021-01-28 ENCOUNTER — Other Ambulatory Visit: Payer: Self-pay

## 2021-01-28 ENCOUNTER — Ambulatory Visit (INDEPENDENT_AMBULATORY_CARE_PROVIDER_SITE_OTHER): Payer: Medicare Other | Admitting: Podiatry

## 2021-01-28 ENCOUNTER — Telehealth: Payer: Self-pay | Admitting: Podiatry

## 2021-01-28 DIAGNOSIS — E11621 Type 2 diabetes mellitus with foot ulcer: Secondary | ICD-10-CM

## 2021-01-28 DIAGNOSIS — E1140 Type 2 diabetes mellitus with diabetic neuropathy, unspecified: Secondary | ICD-10-CM

## 2021-01-28 DIAGNOSIS — E08621 Diabetes mellitus due to underlying condition with foot ulcer: Secondary | ICD-10-CM

## 2021-01-28 DIAGNOSIS — L97422 Non-pressure chronic ulcer of left heel and midfoot with fat layer exposed: Secondary | ICD-10-CM

## 2021-01-28 DIAGNOSIS — M86171 Other acute osteomyelitis, right ankle and foot: Secondary | ICD-10-CM

## 2021-01-28 MED ORDER — PREGABALIN 75 MG PO CAPS: 75.0000 mg | ORAL_CAPSULE | Freq: Two times a day (BID) | ORAL | 0 refills | Status: DC

## 2021-01-28 MED ORDER — OXYCODONE-ACETAMINOPHEN 10-325 MG PO TABS: 1.0000 | ORAL_TABLET | ORAL | 0 refills | Status: DC | PRN

## 2021-01-28 NOTE — Telephone Encounter (Signed)
[facility] called regarding patients referral, stated they have been unable to reach patient. Stated they noticed he has appointment with you today and would like for you to let them know they are trying to contact patient due to urgent referral, Please Advise  Infectious Disease   [phone]

## 2021-01-28 NOTE — Progress Notes (Signed)
  Subjective:  Patient ID: Charles Thompson, male    DOB: January 14, 1961,  MRN: [id number]  Chief Complaint  Patient presents with  . Foot Ulcer    Pt. States he does not have much feeling but is doing well. Pt had foot in a puddle of water a few days ago that soiled his dressing.    60 y.o. male presents for wound care. Hx confirmed with patient. States he stepped in a puddle a few days ago in his kitchen and got the dressing wet.  Objective:  Physical Exam: Wound Location: left foot distal, heel Wound Base: Mixed Granular/Fibrotic Peri-wound: Calloused wound without warmth, erythema, signs of acute infection Moderate sanguinous drainage on dressing Wounds both without active warmth, erythema,signs of infection. Graft intact with staples. Retention sutures intact. Mild maceration of wound 2/2 drainage.  Assessment:   1. Other acute osteomyelitis of right foot (HCC)   2. Diabetic ulcer of left heel associated with diabetes mellitus due to underlying condition, with fat layer exposed (HCC)   3. Diabetic ulcer of left midfoot associated with type 2 diabetes mellitus, with fat layer exposed (HCC)   4. Painful diabetic neuropathy (HCC)     Plan:  Patient was evaluated and treated and all questions answered.  Ulcer left heel and midfoot; with OM of calcaneus and distal metatarsal -Overall improving. Retention sutures intact. No signs of acute infection today -Culture reviewed. Pending ID - they have been trying to call him. Patient told this and given the number to call for his appointment -Redressed heel with lubricating jelly and DSD. Leave intact 1 week.  -Refill pain Rx.  Right foot calluses -Ulcer submet 1 healed  Neuropathy -Discussed treatments. Failed gabapentin. Trial lyrica  Return in about 1 week (around 02/04/2021) for Wound Care.

## 2021-01-28 NOTE — Telephone Encounter (Signed)
Will do! thanks

## 2021-02-04 ENCOUNTER — Encounter: Payer: Self-pay | Admitting: Internal Medicine

## 2021-02-04 ENCOUNTER — Ambulatory Visit (INDEPENDENT_AMBULATORY_CARE_PROVIDER_SITE_OTHER): Payer: Medicare Other | Admitting: Podiatry

## 2021-02-04 ENCOUNTER — Other Ambulatory Visit: Payer: Self-pay

## 2021-02-04 ENCOUNTER — Other Ambulatory Visit (HOSPITAL_COMMUNITY): Payer: Self-pay | Admitting: Psychiatry

## 2021-02-04 ENCOUNTER — Ambulatory Visit (INDEPENDENT_AMBULATORY_CARE_PROVIDER_SITE_OTHER): Payer: Medicare Other | Admitting: Internal Medicine

## 2021-02-04 VITALS — BP 136/75 | HR 63 | Temp 98.3°F | Wt 219.0 lb

## 2021-02-04 DIAGNOSIS — L089 Local infection of the skin and subcutaneous tissue, unspecified: Secondary | ICD-10-CM | POA: Diagnosis not present

## 2021-02-04 DIAGNOSIS — L97422 Non-pressure chronic ulcer of left heel and midfoot with fat layer exposed: Secondary | ICD-10-CM

## 2021-02-04 DIAGNOSIS — E11621 Type 2 diabetes mellitus with foot ulcer: Secondary | ICD-10-CM

## 2021-02-04 DIAGNOSIS — M869 Osteomyelitis, unspecified: Secondary | ICD-10-CM

## 2021-02-04 DIAGNOSIS — E11628 Type 2 diabetes mellitus with other skin complications: Secondary | ICD-10-CM

## 2021-02-04 DIAGNOSIS — M86171 Other acute osteomyelitis, right ankle and foot: Secondary | ICD-10-CM

## 2021-02-04 DIAGNOSIS — E08621 Diabetes mellitus due to underlying condition with foot ulcer: Secondary | ICD-10-CM

## 2021-02-04 MED ORDER — AMOXICILLIN-POT CLAVULANATE 875-125 MG PO TABS: 1.0000 | ORAL_TABLET | Freq: Two times a day (BID) | ORAL | 1 refills | Status: AC

## 2021-02-04 NOTE — Progress Notes (Signed)
[facility]  Reason for Consult: diabetic foot infection Referring Provider: Ventura Sellers, DPM    Patient Active Problem List   Diagnosis Date Noted  . Encounter for planned post-operative wound closure   . Surgical wound present   . Skin ulcer of left heel with necrosis of muscle (HCC)   . Abscess of left heel   . Acute osteomyelitis of calcaneum, left (HCC)   . Streptococcal bacteremia   . Cellulitis 10/10/2020  . Hyponatremia 10/10/2020  . Obesity (BMI 30.0-34.9) 10/10/2020  . Hypoalbuminemia 10/10/2020  . Sepsis (HCC) 10/10/2020  . AKI (acute kidney injury) (HCC) 10/10/2020  . Insomnia secondary to restless leg syndrome 07/31/2020  . Right lumbar radiculopathy 06/20/2019  . Major depressive disorder, recurrent episode, in remission with seasonal pattern (HCC) 02/15/2019  . History of transmetatarsal amputation of left foot (HCC) 10/19/2018  . Osteomyelitis of second toe of right foot (HCC)   . Diabetic foot infection (HCC) 06/20/2018  . Diabetic ulcer of left foot associated with type 2 diabetes mellitus (HCC)   . Tobacco abuse 12/02/2017  . Status post amputation of right great toe (HCC) 10/29/2016  . Type 2 diabetes mellitus with diabetic neuropathy, with long-term current use of insulin (HCC) 12/05/2015  . Skin ulcer of left foot with fat layer exposed (HCC) 10/31/2015      HPI: Charles Thompson is a 60 y.o. male DM2, smoker, s/p left tma, chronic left foot ulcer/diabetic foot infection referred here by podiatry for management  Patient had left foot ulcer in the plantar fore foot and subsequently the heel since 09/2020 after given diabetic shoes. These became infected and he had undergone many procedures as well as antibiotic courses as below mentioned.   Procedure history: 3/09 procedure by podiatry 1) Debridement of wound to fascia with partial closure             2) Debridement of wound to bone with wound  closure             3) Bone biopsy             4) Application of skin graft substitute 12/18/20 left forefoot wound I&D; left heel I&D and complex wound closure 10/31/20 delayed closure midfoot; I&d left heel sub-fascial 10/20/20 I&D foot/heel abscess; bone biopsy calcaneous 11/25 & 10/13/20 I&D left foot, delayed primary closure of left foot  He also had a foot mri on 3/06 that suggested calcaneal and forefoot OM.  Patient was given on 3/09 a 1 week course clindamycin  Prior to 3/09, he had 6 week at least course of augmentin and brief spurt of doxy or levofloxacin starting 10/21/2020  He last saw podiatry on 3/15. There was wound picture and I reviewed. Patient stepped in a puddle a few days prior to the visit  Since the surgery on 3/09, the incision is healing slowly. No fever, chill. There is serosanguinous oozing out of the wound, but this is getting less as well.  No diarrhea/rash/n/v from the antibiotics  His diabetes mellutis well controlled; last a1c was 5.6 He smokes.  I reviewed Epic charts including op note and office visits with Dr Samuella Cota by patient  Review of Systems: ROS Other ros negative      Past Medical History:  Diagnosis Date  . Anxiety   . Arthritis    neck  . CKD (chronic kidney disease), stage II   . Diabetic peripheral neuropathy (HCC)   . Diabetic ulcer  of left foot (HCC)   . First degree heart block   . GERD (gastroesophageal reflux disease)   . History of cellulitis 09/2020   left lower leg  . History of osteomyelitis    bilateral feet  . Hyperlipidemia   . Hypertension    followed by pcp  (12-14--2021 per pt never had a stress test)  . MDD (major depressive disorder)   . Type 2 diabetes mellitus with insulin therapy (HCC)    followed by pcp   (01-17-2021  pt stated checks blood sugar 6 times weekly in am,  fasting sugar--- 120)    Social History   Tobacco Use  . Smoking status: Current Every Day Smoker    Packs/day: 0.75    Types:  Cigarettes  . Smokeless tobacco: Never Used  Vaping Use  . Vaping Use: Never used  Substance Use Topics  . Alcohol use: Yes    Alcohol/week: 0.0 standard drinks    Comment: occ  . Drug use: Not Currently    Types: Marijuana    Comment: "not lately"    Family History  Problem Relation Age of Onset  . Diabetes Other     No Known Allergies  OBJECTIVE: There were no vitals filed for this visit. There is no height or weight on file to calculate BMI.   Physical Exam No distress, conversant Heent: normocephalic; per; conj clear; eomi Neck supple cv rrr no mrg Lungs cta abd s/nt Ext no edema msk s/p left tma previously; there are 2 incision (calcaneal incision almost filled in with tissue, the forefoot incision still a gap in SQ tissue but underneath good granulation) both with sutures intact; right foot without ulcer Skin no rash Neuro cn2-12 intact; nonfocal Psych: alert/oriented   Lab:  Microbiology: 3/09 surgical cx Rare mssa (R tetra; S bactrim)  2/02 left wound cx Multiple organisms no dominant; NO staph aureus or strep group a; mixed anaerobic flora Gram stain abundant gnr and moderate gpc  Serology:  Imaging: 01/19/21 mri left heel Osteomyelitis in the posterior and inferior calcaneus as described above.  Marrow edema in the distal 2.2 cm of the second metatarsal stump and inferior, lateral aspect of the medial cuneiform is worrisome for osteomyelitis. There appears to be a skin wound overlying the second metatarsal stump.  Negative for abscess or septic joint.   2019 ABI Right: Resting right ankle-brachial index is within normal range. No  evidence of significant right lower extremity arterial disease.   Unable to obtain TBI due to great toe amputation.  Left: Resting left ankle-brachial index is within normal range. No  evidence of significant left lower extremity arterial disease. The left  toe-brachial index is normal.      Assessment/plan: Problem List Items Addressed This Visit   None   Visit Diagnoses    Type 2 diabetes mellitus with left diabetic foot infection (HCC)    -  Primary   Relevant Orders   CBC   Comprehensive metabolic panel   C-reactive protein   Osteomyelitis, unspecified site, unspecified type (HCC)       Relevant Orders   CBC   Comprehensive metabolic panel   C-reactive protein      Rather extensive/complex infection. Discussed high risk failure and also high rate relapse diabetic foot infection especially if calcaneous is involved  He still smokes and has no way around not putting weight on his left affected foot His diabetes is rather well controlled He hasn't had a vascular evaluation since 2019  I would resume augmentin and stop clindamycin; discussed PO noninferior to iv for diabetic foot infection treatment, and treatment beyond 6-8 weeks offer no extra benefit   -stop clinda -cbc, cmp, crp today -start augmentin (cefpodoxim too expensive for him) 1 tablet po bid -f/u with me 4-5 weeks   I spent 60 minute reviewing data/chart, and coordinating care and >50% direct face to face time providing counseling/discussing diagnostics/treatment plan with patient     Follow-up: No follow-ups on file.  Raymondo Band, MD [facility] [phone] pager   [phone] cell 02/04/2021, 3:00 PM

## 2021-02-04 NOTE — Patient Instructions (Addendum)
It appears your diabetic foot infection is currently controlled  We will try oral antibiotics amoxicillin-clavulonate (no inferiority to IV) for another 6-8 weeks. After this duration, if there is still sign of infection, then further surgery would be indicated  We discussed the high recurrent rate as well as failure rate of treatment for diabetic foot infection   Please discuss with dr Samuella Cota to repeat ABI (arterial flow ultrasound) study Consider smoking cessation Avoid bearing weight on the left foot, to promote healing   Please do blood tests today, and follow up with me in around 4-5 weeks

## 2021-02-05 LAB — COMPREHENSIVE METABOLIC PANEL
AG Ratio: 1.5 (calc) (ref 1.0–2.5)
ALT: 18 U/L (ref 9–46)
AST: 14 U/L (ref 10–35)
Albumin: 4.3 g/dL (ref 3.6–5.1)
Alkaline phosphatase (APISO): 99 U/L (ref 35–144)
BUN: 18 mg/dL (ref 7–25)
CO2: 28 mmol/L (ref 20–32)
Calcium: 9.6 mg/dL (ref 8.6–10.3)
Chloride: 104 mmol/L (ref 98–110)
Creat: 0.81 mg/dL (ref 0.70–1.25)
Globulin: 2.9 g/dL (calc) (ref 1.9–3.7)
Glucose, Bld: 80 mg/dL (ref 65–99)
Potassium: 4.3 mmol/L (ref 3.5–5.3)
Sodium: 138 mmol/L (ref 135–146)
Total Bilirubin: 0.5 mg/dL (ref 0.2–1.2)
Total Protein: 7.2 g/dL (ref 6.1–8.1)

## 2021-02-05 LAB — CBC
HCT: 43.9 % (ref 38.5–50.0)
Hemoglobin: 14.7 g/dL (ref 13.2–17.1)
MCH: 28.1 pg (ref 27.0–33.0)
MCHC: 33.5 g/dL (ref 32.0–36.0)
MCV: 83.8 fL (ref 80.0–100.0)
MPV: 10.3 fL (ref 7.5–12.5)
Platelets: 298 10*3/uL (ref 140–400)
RBC: 5.24 10*6/uL (ref 4.20–5.80)
RDW: 13.5 % (ref 11.0–15.0)
WBC: 8.9 10*3/uL (ref 3.8–10.8)

## 2021-02-05 LAB — C-REACTIVE PROTEIN: CRP: 16.1 mg/L — ABNORMAL HIGH (ref <8.0)

## 2021-02-13 NOTE — Progress Notes (Signed)
  Subjective:  Patient ID: Charles Thompson, male    DOB: 11/21/1960,  MRN: [id number]  Chief Complaint  Patient presents with  . Diabetic Ulcer    Follow up left foot and heel ulcer Pt states no questions or concerns. Pt. Is unsure about healing process.    60 y.o. male presents for wound care. Hx confirmed with patient. States he stepped in a puddle a few days ago in his kitchen and got the dressing wet.  Objective:  Physical Exam: Wound Location: left foot distal, heel Wound Base: 5.5x2 distal Peri-wound: Calloused wound without warmth, erythema, signs of acute infection Moderate sanguinous drainage on dressing Wounds without active warmth and erythema    Assessment:   1. Diabetic ulcer of left heel associated with diabetes mellitus due to underlying condition, with fat layer exposed (HCC)   2. Diabetic ulcer of left midfoot associated with type 2 diabetes mellitus, with fat layer exposed (HCC)   3. Other acute osteomyelitis of right foot (HCC)     Plan:  Patient was evaluated and treated and all questions answered.  Ulcer left heel and midfoot; with OM of calcaneus and distal metatarsal -Heel wound continues to improve.  Wound grafts removed today partial suture removal.  Distal foot wound slow to heal.  Dressed with Betadine wet-to-dry today.  Courtesy debridement of callus areas.  Patient advised if any worsening warmth erythema -Discussed importance of follow-up with infectious disease  Return in about 2 weeks (around 02/18/2021) for Wound Care.

## 2021-02-18 ENCOUNTER — Ambulatory Visit (INDEPENDENT_AMBULATORY_CARE_PROVIDER_SITE_OTHER): Payer: Medicare Other | Admitting: Podiatry

## 2021-02-18 ENCOUNTER — Other Ambulatory Visit: Payer: Self-pay

## 2021-02-18 DIAGNOSIS — E08621 Diabetes mellitus due to underlying condition with foot ulcer: Secondary | ICD-10-CM

## 2021-02-18 DIAGNOSIS — E11621 Type 2 diabetes mellitus with foot ulcer: Secondary | ICD-10-CM

## 2021-02-18 DIAGNOSIS — L97422 Non-pressure chronic ulcer of left heel and midfoot with fat layer exposed: Secondary | ICD-10-CM

## 2021-03-04 ENCOUNTER — Encounter: Payer: Medicare Other | Admitting: Podiatry

## 2021-03-07 ENCOUNTER — Other Ambulatory Visit: Payer: Self-pay

## 2021-03-07 ENCOUNTER — Ambulatory Visit: Payer: Medicare Other | Admitting: Podiatry

## 2021-03-07 DIAGNOSIS — E11621 Type 2 diabetes mellitus with foot ulcer: Secondary | ICD-10-CM

## 2021-03-07 DIAGNOSIS — E08621 Diabetes mellitus due to underlying condition with foot ulcer: Secondary | ICD-10-CM

## 2021-03-07 DIAGNOSIS — L97422 Non-pressure chronic ulcer of left heel and midfoot with fat layer exposed: Secondary | ICD-10-CM

## 2021-03-07 MED ORDER — OXYCODONE-ACETAMINOPHEN 10-325 MG PO TABS: 1.0000 | ORAL_TABLET | ORAL | 0 refills | Status: DC | PRN

## 2021-03-07 MED ORDER — SILVER SULFADIAZINE 1 % EX CREA: TOPICAL_CREAM | CUTANEOUS | 0 refills | Status: DC

## 2021-03-10 LAB — WOUND CULTURE
MICRO NUMBER:: 11798205
SPECIMEN QUALITY:: ADEQUATE

## 2021-03-10 LAB — TIQ-MISC

## 2021-03-11 ENCOUNTER — Telehealth: Payer: Self-pay | Admitting: Podiatry

## 2021-03-11 NOTE — Telephone Encounter (Signed)
Patient lost his prescription for diabetic shoes that was given to him at his last office visit. Please fax the prescription [phone]

## 2021-03-11 NOTE — Telephone Encounter (Signed)
Will fax today

## 2021-03-17 ENCOUNTER — Other Ambulatory Visit: Payer: Self-pay | Admitting: Podiatry

## 2021-03-17 NOTE — Telephone Encounter (Signed)
I just tried 3 different times, two different ways to refill this and it's not letting me for some reason. I will have to try it again later

## 2021-03-17 NOTE — Telephone Encounter (Signed)
Please advise 

## 2021-03-18 MED ORDER — PREGABALIN 75 MG PO CAPS: ORAL_CAPSULE | ORAL | 2 refills | Status: DC

## 2021-03-18 MED ORDER — PREGABALIN 75 MG PO CAPS: 75.0000 mg | ORAL_CAPSULE | Freq: Two times a day (BID) | ORAL | 2 refills | Status: DC

## 2021-03-18 NOTE — Addendum Note (Signed)
Addended by: Ventura Sellers on: 03/18/2021 11:10 AM   Modules accepted: Orders

## 2021-03-20 ENCOUNTER — Encounter: Payer: Self-pay | Admitting: Podiatry

## 2021-03-20 NOTE — Progress Notes (Signed)
  Subjective:  Patient ID: Charles Thompson, male    DOB: [birth year],  MRN: [id number]  Chief Complaint  Patient presents with  . Routine Post Op     2 week fu POV #3 DOS 01/22/2021 LT FOOT WOUND DEBRIDMENT OD BOTH WOUNDS POSSIBLE CLOSURE, POSSIBLE SKIN GRAFT SUBSTITIUTE, POSSIBLE CALCANEUS BONE BIOPSY. Pt states no change.    60 y.o. male presents for wound care. Hx confirmed with patient.  Thinks the feet are doing about the same  Objective:  Physical Exam: Wound Location: left foot distal, heel Wound Base: 5 x 3 distal 3 x 0.3 proximal Peri-wound: Calloused wound without warmth, erythema, signs of acute infection Moderate sanguinous drainage on dressing Wounds without active warmth and erythema    Assessment:   1. Diabetic ulcer of left heel associated with diabetes mellitus due to underlying condition, with fat layer exposed (HCC)   2. Diabetic ulcer of left midfoot associated with type 2 diabetes mellitus, with fat layer exposed (HCC)     Plan:  Patient was evaluated and treated and all questions answered.  Ulcer left heel and midfoot; with OM of calcaneus and distal metatarsal -Wound continues to improve albeit slowly -Dressed with Prisma and DSD at the forefoot -Patient to continue dressing daily -ABX per ID  No follow-ups on file.

## 2021-03-20 NOTE — Progress Notes (Signed)
  Subjective:  Patient ID: Charles Thompson, male    DOB: [birth year],  MRN: [id number]  Chief Complaint  Patient presents with  . Diabetic Ulcer    2 week fu POV #3 DOS 01/22/2021 LT FOOT WOUND DEBRIDMENT OD BOTH WOUNDS POSSIBLE CLOSURE, POSSIBLE SKIN GRAFT SUBSTITIUTE, POSSIBLE CALCANEUS BONE BIOPSY   60 y.o. male presents for wound care. Hx confirmed with patient.  Thinks the feet are doing about the same  Objective:  Physical Exam: Wound Location: left foot distal, heel Wound Base: 6x2.5, 4*0.5 Peri-wound: Calloused wound without warmth, erythema, signs of acute infection Moderate sanguinous drainage on dressing Wounds without active warmth and erythema  Assessment:   1. Diabetic ulcer of left heel associated with diabetes mellitus due to underlying condition, with fat layer exposed (HCC)   2. Diabetic ulcer of left midfoot associated with type 2 diabetes mellitus, with fat layer exposed (HCC)     Plan:  Patient was evaluated and treated and all questions answered.  Ulcer left heel and midfoot; with OM of calcaneus and distal metatarsal -Slightly worsened today -Dressed with Prisma and DSD at the forefoot -Patient to continue dressing daily -Plan for wound VAC application next visit if needed. -ABX per ID  Return in about 2 weeks (around 03/21/2021) for Wound Care.

## 2021-03-21 ENCOUNTER — Other Ambulatory Visit: Payer: Self-pay

## 2021-03-21 ENCOUNTER — Ambulatory Visit (INDEPENDENT_AMBULATORY_CARE_PROVIDER_SITE_OTHER): Payer: Medicare Other | Admitting: Podiatry

## 2021-03-21 DIAGNOSIS — L97422 Non-pressure chronic ulcer of left heel and midfoot with fat layer exposed: Secondary | ICD-10-CM | POA: Diagnosis not present

## 2021-03-21 DIAGNOSIS — E08621 Diabetes mellitus due to underlying condition with foot ulcer: Secondary | ICD-10-CM | POA: Diagnosis not present

## 2021-03-21 NOTE — Progress Notes (Signed)
  Subjective:  Patient ID: Charles Thompson, male    DOB: [birth year],  MRN: [id number]  Chief Complaint  Patient presents with  . Routine Post Op    POV#4 DOS 3.9.2022 Pt denies any new concerns, denies fever/nausea/vomiting/chills.   60 y.o. male presents for wound care. Hx confirmed with patient. Not wearing his boot today wearing new shoes he purchased.  Objective:  Physical Exam: Wound Location: left foot distal, heel Wound Base: 2.5x4 distal, 2.2 proximal distal with skin bridge; 5x2 at widest centrally but 0.5 at margins Peri-wound: Calloused wound without warmth, erythema, signs of acute infection Moderate sanguinous drainage on dressing Wounds without active warmth and erythema  Assessment:   1. Diabetic ulcer of left heel associated with diabetes mellitus due to underlying condition, with fat layer exposed (HCC)     Plan:  Patient was evaluated and treated and all questions answered.  Ulcer left heel and midfoot; with OM of calcaneus and distal metatarsal -Appears to be improving -Dressed with saline WTD today. -Patient to continue dressing daily -Hold off VAC today. -ABX per ID  Procedure: Excisional Debridement of Wound Indication: Removal of non-viable soft tissue from the wound to promote healing.  Anesthesia: none Pre-Debridement Wound Measurements: 2.5x4, 2x2, 5x0.5 cm  Post-Debridement Wound Measurements: 2.5x4 distal, 2.2 proximal distal with skin bridge; 5x2 at widest centrally but 0.5 at margins Type of Debridement: Sharp Excisional Tissue Removed: Non-viable soft tissue Instrumentation: 15 blade and tissue nipper Depth of Debridement: subcutaneous tissue. Technique: Sharp excisional debridement to bleeding, viable wound base.  Dressing: Dry, sterile, compression dressing. Disposition: Patient tolerated procedure well.  Return in about 2 weeks (around 04/04/2021).

## 2021-04-04 ENCOUNTER — Ambulatory Visit (INDEPENDENT_AMBULATORY_CARE_PROVIDER_SITE_OTHER): Payer: Medicare Other | Admitting: Podiatry

## 2021-04-04 ENCOUNTER — Other Ambulatory Visit: Payer: Self-pay

## 2021-04-04 DIAGNOSIS — E08621 Diabetes mellitus due to underlying condition with foot ulcer: Secondary | ICD-10-CM

## 2021-04-04 DIAGNOSIS — L97422 Non-pressure chronic ulcer of left heel and midfoot with fat layer exposed: Secondary | ICD-10-CM | POA: Diagnosis not present

## 2021-04-04 MED ORDER — OXYCODONE-ACETAMINOPHEN 10-325 MG PO TABS: 1.0000 | ORAL_TABLET | ORAL | 0 refills | Status: DC | PRN

## 2021-04-04 MED ORDER — SILVER SULFADIAZINE 1 % EX CREA: TOPICAL_CREAM | CUTANEOUS | 0 refills | Status: DC

## 2021-04-04 MED ORDER — PREGABALIN 75 MG PO CAPS: 75.0000 mg | ORAL_CAPSULE | Freq: Two times a day (BID) | ORAL | 2 refills | Status: DC

## 2021-04-04 NOTE — Progress Notes (Signed)
  Subjective:  Patient ID: Charles Thompson, male    DOB: 16-Jun-1961,  MRN: [id number]  Chief Complaint  Patient presents with  . Wound Check    Denies fever/chills/nausea/vomiting. No new concerns. Pt wants to discuss when he can get his inserts.   60 y.o. male presents for wound care. Hx confirmed with patient.    Objective:  Physical Exam: Wound Location: left foot distal, heel Wound Base: 2x4 distal, 2x1.5 proximal distal with skin bridge; 5x0.5 Peri-wound: Calloused wound without warmth, erythema, signs of acute infection Moderate sanguinous drainage on dressing Wounds without active warmth and erythema  Assessment:   1. Diabetic ulcer of left heel associated with diabetes mellitus due to underlying condition, with fat layer exposed (HCC)     Plan:  Patient was evaluated and treated and all questions answered.  Ulcer left heel and midfoot; with OM of calcaneus and distal metatarsal -Continues to improve. -Dressed with prisma and DSD today. -Patient to continue dressing daily -Hold off VAC until just a little smaller -ABX per ID   Return in about 2 weeks (around 04/18/2021) for Wound Care.

## 2021-04-18 ENCOUNTER — Ambulatory Visit: Payer: Medicare Other | Admitting: Podiatry

## 2021-04-25 ENCOUNTER — Ambulatory Visit (INDEPENDENT_AMBULATORY_CARE_PROVIDER_SITE_OTHER): Payer: Medicare Other | Admitting: Podiatry

## 2021-04-25 ENCOUNTER — Other Ambulatory Visit: Payer: Self-pay

## 2021-04-25 DIAGNOSIS — E11621 Type 2 diabetes mellitus with foot ulcer: Secondary | ICD-10-CM

## 2021-04-25 DIAGNOSIS — E08621 Diabetes mellitus due to underlying condition with foot ulcer: Secondary | ICD-10-CM | POA: Diagnosis not present

## 2021-04-25 DIAGNOSIS — L97422 Non-pressure chronic ulcer of left heel and midfoot with fat layer exposed: Secondary | ICD-10-CM

## 2021-05-09 ENCOUNTER — Other Ambulatory Visit: Payer: Self-pay

## 2021-05-09 ENCOUNTER — Ambulatory Visit (INDEPENDENT_AMBULATORY_CARE_PROVIDER_SITE_OTHER): Payer: Medicare Other | Admitting: Podiatry

## 2021-05-09 DIAGNOSIS — L97422 Non-pressure chronic ulcer of left heel and midfoot with fat layer exposed: Secondary | ICD-10-CM

## 2021-05-09 DIAGNOSIS — E11621 Type 2 diabetes mellitus with foot ulcer: Secondary | ICD-10-CM | POA: Diagnosis not present

## 2021-05-09 MED ORDER — OXYCODONE-ACETAMINOPHEN 10-325 MG PO TABS: 1.0000 | ORAL_TABLET | ORAL | 0 refills | Status: DC | PRN

## 2021-05-10 NOTE — Progress Notes (Signed)
  Subjective:  Patient ID: Charles Thompson, male    DOB: 1961/03/10,  MRN: [id number]  Chief Complaint  Patient presents with   Wound Check    Pt states improvement, denies any new concerns. Denies fever/nausea/vomiting/chills.   60 y.o. male presents for wound care. Hx confirmed with patient.    Objective:  Physical Exam: Wound Location: left foot distal, heel Wound Base: 2x3.5 distal, 2x1 proximal distal with skin bridge Peri-wound: Calloused wound without warmth, erythema, signs of acute infection Moderate sanguinous drainage on dressing Wounds without active warmth and erythema  Assessment:   1. Diabetic ulcer of left midfoot associated with type 2 diabetes mellitus, with fat layer exposed (HCC)      Plan:  Patient was evaluated and treated and all questions answered.  Ulcer left heel and midfoot; with OM of calcaneus and distal metatarsal -Continues to improve. -ABX per ID -PICO wound VAC system applied to the left foot. Patient advised to leave on for 5 days or until device indicates removal required. Dress with silvadene and DSD after removal of the VAC. Dressed with ACE bandage today -Heel appears healed -Calluses pared courtesy.  Procedure: Selective Debridement of Wound Rationale: Removal of devitalized tissue from the wound to promote healing.  Pre-Debridement Wound Measurements: 2x3.5 distal, 2x1 Post-Debridement Wound Measurements: same as pre-debridement. Type of Debridement: sharp selective Instrumentation: dermal curette, tissue nipper Tissue Removed: Devitalized soft-tissue Dressing: Dry, sterile, compression dressing. Disposition: Patient tolerated procedure well.  Procedure: Single Use Wound VAC Application Location: left midfoot Wound Measurement: 2x3.5 distal, 2x1 Technique: Padded dressing applied to wound, reinforced with draping. Device turned on with good seal noted. Disposition: Patient tolerated procedure well.    No follow-ups on file.

## 2021-05-13 ENCOUNTER — Telehealth (HOSPITAL_COMMUNITY): Payer: Self-pay

## 2021-05-13 NOTE — Telephone Encounter (Signed)
error 

## 2021-05-13 NOTE — Telephone Encounter (Signed)
CERTIFIED LETTER AND LIST OF REFERRALS HAS BEEN SENT OUT TO PATIENT 

## 2021-05-16 NOTE — Progress Notes (Signed)
  Subjective:  Patient ID: Charles Thompson, male    DOB: [birth year],  MRN: [id number]  Chief Complaint  Patient presents with   Wound Check    Wound check, no new concerns, denies fever/chills/nausea/vomiting.   60 y.o. male presents for wound care. Hx confirmed with patient.    Objective:  Physical Exam: Wound Location: left foot distal Wound Base: 2.5x3.5, 2.5x2 with skin bridge.  Peri-wound: Calloused wound without warmth, erythema, signs of acute infection Moderate sanguinous drainage on dressing Wounds without active warmth and erythema  Assessment:   1. Diabetic ulcer of left heel associated with diabetes mellitus due to underlying condition, with fat layer exposed (HCC)   2. Diabetic ulcer of left midfoot associated with type 2 diabetes mellitus, with fat layer exposed (HCC)      Plan:  Patient was evaluated and treated and all questions answered.  Ulcer left heel and midfoot; with OM of calcaneus and distal metatarsal -Wound continues to improve -Wound debrided today -Other callus lesions debrided courtesy -Dressed with Prisma and DSD -Plan for wound VAC therapy in the coming weeks  Procedure: Selective Debridement of Wound Rationale: Removal of devitalized tissue from the wound to promote healing.  Pre-Debridement Wound Measurements: 2.5x3.5, 2.5x2  cm   Post-Debridement Wound Measurements: same as pre-debridement. Type of Debridement: sharp selective Instrumentation: dermal curette, tissue nipper Tissue Removed: Devitalized soft-tissue Dressing: Dry, sterile, compression dressing. Disposition: Patient tolerated procedure well.    Return in about 2 weeks (around 05/09/2021) for Wound Care.

## 2021-05-23 ENCOUNTER — Ambulatory Visit (INDEPENDENT_AMBULATORY_CARE_PROVIDER_SITE_OTHER): Payer: Medicare Other | Admitting: Podiatry

## 2021-05-23 ENCOUNTER — Other Ambulatory Visit: Payer: Self-pay

## 2021-05-23 VITALS — Temp 98.2°F

## 2021-05-23 DIAGNOSIS — E11621 Type 2 diabetes mellitus with foot ulcer: Secondary | ICD-10-CM

## 2021-05-23 DIAGNOSIS — L97422 Non-pressure chronic ulcer of left heel and midfoot with fat layer exposed: Secondary | ICD-10-CM

## 2021-05-23 DIAGNOSIS — L97411 Non-pressure chronic ulcer of right heel and midfoot limited to breakdown of skin: Secondary | ICD-10-CM | POA: Diagnosis not present

## 2021-05-23 MED ORDER — SILVER SULFADIAZINE 1 % EX CREA: TOPICAL_CREAM | CUTANEOUS | 0 refills | Status: DC

## 2021-05-23 MED ORDER — OXYCODONE-ACETAMINOPHEN 10-325 MG PO TABS: 1.0000 | ORAL_TABLET | ORAL | 0 refills | Status: DC | PRN

## 2021-05-26 NOTE — Progress Notes (Signed)
  Subjective:  Patient ID: Charles Thompson, male    DOB: [birth year],  MRN: [id number]  Chief Complaint  Patient presents with   Callouses    Patient has 3 large callus to the bottom of the right foot that is causing a significant amount of pain and discomfort. Patient not interested in answering a lot of questions because he is very uncomfortable.    Diabetic Ulcer    Patient denies nausea, vomiting, fever and chills.    60 y.o. male presents for wound care. Hx confirmed with patient. States he got his new inserts and he thinks they area worse than the ones previously fabricated at our office.   Objective:  Physical Exam: Wound Location: left foot distal, heel Wound Base: 2*3, 1x1 proximal distal with skin bridge Peri-wound: Calloused wound without warmth, erythema, signs of acute infection Moderate sanguinous drainage on dressing Wounds without active warmth and erythema  Minor skin breakdown right 1st MPJ without warmth, erythema, SOI. Assessment:   1. Diabetic ulcer of left midfoot associated with type 2 diabetes mellitus, with fat layer exposed (HCC)   2. Diabetic ulcer of right midfoot associated with type 2 diabetes mellitus, limited to breakdown of skin (HCC)       Plan:  Patient was evaluated and treated and all questions answered.  Ulcer left heel and midfoot; with OM of calcaneus and distal metatarsal -Continues to improve. -Discussed I think his right foot insert needs to be offloaded furhter -ABX per ID -Attempted to dress with PICO dressing today. Patient's pump device was not working. None available to replace. Packed wound with prisma and DSD. Will attempt mWVAC next visit. -Heel appears healed -Other calluses pared courtesy.  Procedure: Selective Debridement of Wound Rationale: Removal of devitalized tissue from the wound to promote healing.  Pre-Debridement Wound Measurements:  2*3, 1x1 Post-Debridement Wound Measurements: same as pre-debridement. Type of  Debridement: sharp selective Instrumentation: dermal curette, tissue nipper Tissue Removed: Devitalized soft-tissue Dressing: Dry, sterile, compression dressing. Disposition: Patient tolerated procedure well.   Right 1st MPJ ulcer -Skin breakdown noted, likely 2/2 new insert and inadequate offloading. The arch is not well contoured to the foot either. Advised patient to return to orthotist for adjustment or new fabrication   Return in about 2 weeks (around 06/06/2021) for Wound Care, Left.

## 2021-06-03 ENCOUNTER — Other Ambulatory Visit: Payer: Self-pay

## 2021-06-03 ENCOUNTER — Ambulatory Visit (INDEPENDENT_AMBULATORY_CARE_PROVIDER_SITE_OTHER): Payer: Medicare Other | Admitting: Podiatry

## 2021-06-03 DIAGNOSIS — E11621 Type 2 diabetes mellitus with foot ulcer: Secondary | ICD-10-CM

## 2021-06-03 DIAGNOSIS — L97422 Non-pressure chronic ulcer of left heel and midfoot with fat layer exposed: Secondary | ICD-10-CM

## 2021-06-03 NOTE — Progress Notes (Signed)
  Subjective:  Patient ID: Charles Thompson, male    DOB: 11-14-1961,  MRN: [id number]  No chief complaint on file.  60 y.o. male presents for wound care. Hx confirmed with patient. Went back to the place where he had his insert and the insert was additionally padded. Is wearing normal shoe to both feet today instead of his boot.  Objective:  Physical Exam: Wound Location: left foot distal, heel Wound Base: 2x1, 1x1 proximal distal respectively with skin bridge Peri-wound: Calloused wound without warmth, erythema, signs of acute infection Moderate sanguinous drainage on dressing Wounds without active warmth and erythema  Minor skin breakdown right 1st MPJ without warmth, erythema, SOI. Assessment:   1. Diabetic ulcer of left midfoot associated with type 2 diabetes mellitus, with fat layer exposed (HCC)    Plan:  Patient was evaluated and treated and all questions answered.  Ulcer left heel and midfoot; with OM of calcaneus and distal metatarsal -Continues to improve at forefoot. Left heel appears worsened from his new shoes/insert. Discussed importance of wearing his boot until wounds fully heal. -Debrided as below -Applied mechanical wound VAC to promote healing  Procedure: Selective Debridement of Wound Rationale: Removal of devitalized tissue from the wound to promote healing.  Pre-Debridement Wound Measurements: 2x1, 1x1 Post-Debridement Wound Measurements: same as pre-debridement. Type of Debridement: sharp selective Instrumentation: dermal curette, tissue nipper Tissue Removed: Devitalized soft-tissue Dressing: Dry, sterile, compression dressing. Disposition: Patient tolerated procedure well.   Procedure: Mechanical Wound VAC Application Location: left forefoot Wound Measurement: 2x1, 1,1  Technique: Blue foam to wound base, followed by hydrocolloid 0-Ring, followed by hydrocolloid dressing. Plunger maximally depressed with with good seal noted. Disposition: Patient  tolerated procedure well.   Right 1st MPJ ulcer -Appears improved. Debrided gently today and dressed with silvadene and border dressing. Patient to continue daily. -He had had additional padding applied to the insert which should hopefully help offload this area better.   No follow-ups on file.

## 2021-06-10 ENCOUNTER — Other Ambulatory Visit: Payer: Self-pay

## 2021-06-10 ENCOUNTER — Ambulatory Visit (INDEPENDENT_AMBULATORY_CARE_PROVIDER_SITE_OTHER): Payer: Medicare Other | Admitting: Podiatry

## 2021-06-10 DIAGNOSIS — L97422 Non-pressure chronic ulcer of left heel and midfoot with fat layer exposed: Secondary | ICD-10-CM

## 2021-06-10 DIAGNOSIS — E11621 Type 2 diabetes mellitus with foot ulcer: Secondary | ICD-10-CM

## 2021-06-13 DIAGNOSIS — L97423 Non-pressure chronic ulcer of left heel and midfoot with necrosis of muscle: Secondary | ICD-10-CM

## 2021-06-13 DIAGNOSIS — L97424 Non-pressure chronic ulcer of left heel and midfoot with necrosis of bone: Secondary | ICD-10-CM

## 2021-06-13 NOTE — Progress Notes (Signed)
  Subjective:  Patient ID: Charles Thompson, male    DOB: 12-20-1960,  MRN: [id number]  Chief Complaint  Patient presents with   Diabetic Ulcer    Follow up diabetic ulcer of left foot. Pt states he feels like he is healing slowly but feels better about the overall process.     60 y.o. male presents for wound care. Hx confirmed with patient. Thinks the VAC is helping states it worked well [employer] filled by 3rd day  Objective:  Physical Exam: Wound Location: left foot distal, heel Wound Base: 2*1, 1*1 proximal distal respectively with skin bridge Peri-wound: Calloused wound without warmth, erythema, signs of acute infection Moderate sanguinous drainage on dressing Wounds without active warmth and erythema  Minor skin breakdown right 1st MPJ without warmth, erythema, SOI. Assessment:   1. Diabetic ulcer of left midfoot associated with type 2 diabetes mellitus, with fat layer exposed (HCC)     Plan:  Patient was evaluated and treated and all questions answered.  Ulcer left heel and midfoot; with OM of calcaneus and distal metatarsal -Continue to improve -Wound cleansed and debrided -Offload ulcer with CAM boot Procedure: Selective Debridement of Wound Rationale: Removal of devitalized tissue from the wound to promote healing.  Pre-Debridement Wound Measurements: 2*1,1 *1 cm  Post-Debridement Wound Measurements: same as pre-debridement. Type of Debridement: sharp selective Instrumentation: dermal curette, tissue nipper Tissue Removed: Devitalized soft-tissue Dressing: Dry, sterile, compression dressing. Disposition: Patient tolerated procedure well.  Procedure: Mechanical Wound VAC Application Location: left plantar foot distal Wound Measurement: 2*1,1 *1 cm  Technique: Blue foam to wound base, followed by hydrocolloid 0-Ring, followed by hydrocolloid dressing. Plunger maximally depressed with with good seal noted. Disposition: Patient tolerated procedure well.      No follow-ups on file.

## 2021-06-17 ENCOUNTER — Ambulatory Visit (INDEPENDENT_AMBULATORY_CARE_PROVIDER_SITE_OTHER): Payer: Medicare Other | Admitting: Podiatry

## 2021-06-17 ENCOUNTER — Other Ambulatory Visit: Payer: Self-pay

## 2021-06-17 DIAGNOSIS — E11621 Type 2 diabetes mellitus with foot ulcer: Secondary | ICD-10-CM

## 2021-06-17 DIAGNOSIS — L97422 Non-pressure chronic ulcer of left heel and midfoot with fat layer exposed: Secondary | ICD-10-CM

## 2021-06-17 MED ORDER — OXYCODONE-ACETAMINOPHEN 10-325 MG PO TABS: 1.0000 | ORAL_TABLET | ORAL | 0 refills | Status: DC | PRN

## 2021-06-17 NOTE — Progress Notes (Signed)
  Subjective:  Patient ID: Charles Thompson, male    DOB: Jun 15, 1961,  MRN: [id number]  Chief Complaint  Patient presents with   Diabetic Ulcer    Follow up diabetic ulcer of left foot. Pt states no change, no questions or concerns.     60 y.o. male presents for wound care. Hx confirmed with patient. States the [facility] continues to work well  Objective:  Physical Exam: Wound Location: left foot distal, heel Wound Base: 1.5x1, 1.5x1 proximal distal respectively with skin bridge Peri-wound: Calloused wound without warmth, erythema, signs of acute infection Moderate sanguinous drainage on dressing Wounds without active warmth and erythema  Minor skin breakdown right 1st MPJ without warmth, erythema, SOI. Assessment:   1. Diabetic ulcer of left midfoot associated with type 2 diabetes mellitus, with fat layer exposed (HCC)      Plan:  Patient was evaluated and treated and all questions answered.  Ulcer left heel and midfoot; with OM of calcaneus and distal metatarsal -Continue to improve -Wound cleansed and debrided -Offload ulcer with CAM boot  Procedure: Selective Debridement of Wound Rationale: Removal of devitalized tissue from the wound to promote healing.  Pre-Debridement Wound Measurements: 1.5 cm x 1 cm x 0.3 cm  Post-Debridement Wound Measurements: same as pre-debridement. Type of Debridement: sharp selective Instrumentation: dermal curette, tissue nipper Tissue Removed: Devitalized soft-tissue Dressing: Dry, sterile, compression dressing. Disposition: Patient tolerated procedure well.   Procedure: Mechanical Wound VAC Application Location: left distal Wound Measurement: 1.5 cm x 1 cm x 0.3 cm  Technique: Blue foam to wound base, followed by hydrocolloid 0-Ring, followed by hydrocolloid dressing. Plunger maximally depressed with with good seal noted. Disposition: Patient tolerated procedure well.     No follow-ups on file.

## 2021-06-24 ENCOUNTER — Ambulatory Visit (INDEPENDENT_AMBULATORY_CARE_PROVIDER_SITE_OTHER): Payer: Medicare Other | Admitting: Podiatry

## 2021-06-24 ENCOUNTER — Other Ambulatory Visit: Payer: Self-pay

## 2021-06-24 DIAGNOSIS — E11621 Type 2 diabetes mellitus with foot ulcer: Secondary | ICD-10-CM | POA: Diagnosis not present

## 2021-06-24 DIAGNOSIS — L97422 Non-pressure chronic ulcer of left heel and midfoot with fat layer exposed: Secondary | ICD-10-CM | POA: Diagnosis not present

## 2021-06-24 NOTE — Progress Notes (Signed)
  Subjective:  Patient ID: Charles Thompson, male    DOB: 08-08-1961,  MRN: [id number]  Chief Complaint  Patient presents with   Diabetic Ulcer    1 week recheck for left midfoot ulcer. Pt states improvement with decreased redness.    60 y.o. male presents for wound care. Hx confirmed with patient.  Objective:  Physical Exam: Wound Location: left foot distal, heel Wound Base: 1x1, 1.5x1 proximal distal respectively with skin bridge Peri-wound: Calloused wound without warmth, erythema, signs of acute infection Moderate sanguinous drainage on dressing Wounds without active warmth and erythema  Minor skin breakdown right 1st MPJ without warmth, erythema, SOI. Assessment:   1. Diabetic ulcer of left midfoot associated with type 2 diabetes mellitus, with fat layer exposed (HCC)    Plan:  Patient was evaluated and treated and all questions answered.  Ulcer left heel and midfoot; with OM of calcaneus and distal metatarsal -No debridement of the wound today, PICO single use wound VAC applied for continued NPWT.  Procedure: Single Use Wound VAC Application Location: left foot distal Wound Measurement: 1x1, 1.5x1 Technique: Padded dressing applied to wound, reinforced with draping. Device turned on with good seal noted. Disposition: Patient tolerated procedure well.   Return in about 1 week (around 07/01/2021) for Wound Care.

## 2021-07-01 ENCOUNTER — Other Ambulatory Visit: Payer: Self-pay

## 2021-07-01 ENCOUNTER — Ambulatory Visit (INDEPENDENT_AMBULATORY_CARE_PROVIDER_SITE_OTHER): Payer: Medicare Other | Admitting: Podiatry

## 2021-07-01 DIAGNOSIS — L97422 Non-pressure chronic ulcer of left heel and midfoot with fat layer exposed: Secondary | ICD-10-CM | POA: Diagnosis not present

## 2021-07-01 DIAGNOSIS — E11621 Type 2 diabetes mellitus with foot ulcer: Secondary | ICD-10-CM | POA: Diagnosis not present

## 2021-07-01 MED ORDER — OXYCODONE-ACETAMINOPHEN 10-325 MG PO TABS: 1.0000 | ORAL_TABLET | ORAL | 0 refills | Status: DC | PRN

## 2021-07-01 MED ORDER — SILVER SULFADIAZINE 1 % EX CREA: TOPICAL_CREAM | CUTANEOUS | 0 refills | Status: DC

## 2021-07-01 NOTE — Progress Notes (Signed)
  Subjective:  Patient ID: Charles Thompson, male    DOB: 1961/04/30,  MRN: [id number]  Chief Complaint  Patient presents with   Wound Check    Left foot wound check. Pt states no changes, no questions or concerns.    60 y.o. male presents for wound care. Hx confirmed with patient.  Objective:  Physical Exam: Wound Location: left foot distal, heel Wound Base: 1x1, 1x1 proximal distal respectively with skin bridge Peri-wound: Calloused wound without warmth, erythema, signs of acute infection Moderate sanguinous drainage on dressing Wounds without active warmth and erythema  Minor skin breakdown right 1st MPJ without warmth, erythema, SOI. Assessment:   1. Diabetic ulcer of left midfoot associated with type 2 diabetes mellitus, with fat layer exposed (HCC)     Plan:  Patient was evaluated and treated and all questions answered.  Ulcer left heel and midfoot; with OM of calcaneus and distal metatarsal -Continue to improve. Minimally debrided wounds today. Reapplied PICO VAC  Procedure: Selective Debridement of Wound Rationale: Removal of devitalized tissue from the wound to promote healing.  Pre-Debridement Wound Measurements: 1*1, 1*1  Post-Debridement Wound Measurements: same as pre-debridement. Type of Debridement: sharp selective Instrumentation: dermal curette, tissue nipper Tissue Removed: Devitalized soft-tissue Dressing: Dry, sterile, compression dressing. Disposition: Patient tolerated procedure well.  Procedure: Single Use Wound VAC Application Location: left distal foot Wound Measurement: 1x1, 1x1 Technique: Padded dressing applied to wound, reinforced with draping. Device turned on with good seal noted. Disposition: Patient tolerated procedure well.   Return in about 2 weeks (around 07/15/2021) for Wound Care.

## 2021-07-15 ENCOUNTER — Other Ambulatory Visit: Payer: Self-pay

## 2021-07-15 ENCOUNTER — Ambulatory Visit (INDEPENDENT_AMBULATORY_CARE_PROVIDER_SITE_OTHER): Payer: Medicare Other | Admitting: Podiatry

## 2021-07-15 DIAGNOSIS — E11621 Type 2 diabetes mellitus with foot ulcer: Secondary | ICD-10-CM

## 2021-07-15 DIAGNOSIS — L97422 Non-pressure chronic ulcer of left heel and midfoot with fat layer exposed: Secondary | ICD-10-CM | POA: Diagnosis not present

## 2021-07-15 MED ORDER — OXYCODONE-ACETAMINOPHEN 10-325 MG PO TABS: 1.0000 | ORAL_TABLET | ORAL | 0 refills | Status: DC | PRN

## 2021-07-15 MED ORDER — SILVER SULFADIAZINE 1 % EX CREA: TOPICAL_CREAM | CUTANEOUS | 0 refills | Status: DC

## 2021-07-15 NOTE — Addendum Note (Signed)
Addended by: Ventura Sellers on: 07/15/2021 03:39 PM   Modules accepted: Orders

## 2021-07-15 NOTE — Progress Notes (Signed)
  Subjective:  Patient ID: Charles Thompson, male    DOB: [birth year],  MRN: [id number]  Chief Complaint  Patient presents with   Diabetic Ulcer    Follow up left midfoot ulcer. Pt expressed no questions or concerns.    60 y.o. male presents for wound care. Hx confirmed with patient.  Objective:  Physical Exam: Wound Location: left foot distal, heel Wound Base: 1x1, 0.5*1 proximal distal respectively with skin bridge Peri-wound: Calloused wound without warmth, erythema, signs of acute infection Moderate sanguinous drainage on dressing Wounds without active warmth and erythema  No open wounds right foot - pre-ulcerative areas left 1st MPJ.  Pre-ulcer left heel Assessment:   1. Diabetic ulcer of left midfoot associated with type 2 diabetes mellitus, with fat layer exposed (HCC)    Plan:  Patient was evaluated and treated and all questions answered.  Ulcer left heel and midfoot; with OM of calcaneus and distal metatarsal -Continue to improve - debrided both wounds today -Discussed importance of daily dressing changes with silvadene. Wounds with dirt and debris again today in the wound. -Dressed with silvadene and foam border dressing  Procedure: Selective Debridement of Wound Rationale: Removal of devitalized tissue from the wound to promote healing.  Pre-Debridement Wound Measurements: 1x1 cm x 0.5x1 cm  Post-Debridement Wound Measurements: same as pre-debridement. Type of Debridement: sharp selective Instrumentation: dermal curette, tissue nipper Tissue Removed: Devitalized soft-tissue Dressing: Dry, sterile, compression dressing. Disposition: Patient tolerated procedure well.    No follow-ups on file.

## 2021-07-29 ENCOUNTER — Other Ambulatory Visit: Payer: Self-pay

## 2021-07-29 ENCOUNTER — Ambulatory Visit (INDEPENDENT_AMBULATORY_CARE_PROVIDER_SITE_OTHER): Payer: Medicare Other | Admitting: Podiatry

## 2021-07-29 DIAGNOSIS — E11621 Type 2 diabetes mellitus with foot ulcer: Secondary | ICD-10-CM

## 2021-07-29 DIAGNOSIS — L97422 Non-pressure chronic ulcer of left heel and midfoot with fat layer exposed: Secondary | ICD-10-CM

## 2021-08-04 NOTE — Progress Notes (Signed)
  Subjective:  Patient ID: Charles Thompson, male    DOB: [birth year],  MRN: [id number]  Chief Complaint  Patient presents with   Diabetic Ulcer     2 week  wound care   60 y.o. male presents for wound care. Hx confirmed with patient.  Objective:  Physical Exam: Wound Location: left foot distal, heel Wound Base: 1.1, 1.5*0.5 proximal distal respectively with skin bridge Peri-wound: Calloused wound without warmth, erythema, signs of acute infection Moderate sanguinous drainage on dressing Wounds without active warmth and erythema  No open wounds right foot - pre-ulcerative areas left 1st MPJ.  Pre-ulcer left heel Assessment:   1. Diabetic ulcer of left midfoot associated with type 2 diabetes mellitus, with fat layer exposed (HCC)    Plan:  Patient was evaluated and treated and all questions answered.  Ulcer left heel and midfoot; with OM of calcaneus and distal metatarsal -About the same as last visit.  Debrided both wounds today.  Discussed importance of dressing every day with Silvadene.  Silvadene dressing applied today.  Continue his diabetic inserts.  While I do think that the inserts could be a little better I think that they are better than not wearing them and is likely why the wound has not significantly progressed and has been doing otherwise.   No follow-ups on file.

## 2021-08-15 ENCOUNTER — Ambulatory Visit (INDEPENDENT_AMBULATORY_CARE_PROVIDER_SITE_OTHER): Payer: Medicare Other | Admitting: Podiatry

## 2021-08-15 ENCOUNTER — Other Ambulatory Visit: Payer: Self-pay

## 2021-08-15 DIAGNOSIS — E11621 Type 2 diabetes mellitus with foot ulcer: Secondary | ICD-10-CM | POA: Diagnosis not present

## 2021-08-15 DIAGNOSIS — L97422 Non-pressure chronic ulcer of left heel and midfoot with fat layer exposed: Secondary | ICD-10-CM | POA: Diagnosis not present

## 2021-08-22 ENCOUNTER — Telehealth: Payer: Self-pay | Admitting: Podiatry

## 2021-08-22 MED ORDER — OXYCODONE-ACETAMINOPHEN 10-325 MG PO TABS: 1.0000 | ORAL_TABLET | ORAL | 0 refills | Status: DC | PRN

## 2021-08-22 NOTE — Progress Notes (Signed)
  Subjective:  Patient ID: Charles Thompson, male    DOB: 05/30/1961,  MRN: [id number]  Chief Complaint  Patient presents with   Wound Check    Denies fever/chills/nausea/vomiting.   60 y.o. male presents for wound care. Hx confirmed with patient.  States that he has been dressing the wound.  He is wearing his normal shoe gear and inserts Objective:  Physical Exam: Wound Location: left foot distal, heel Wound Base: 2*1, 1*0.5 proximal distal respectively with skin bridge Peri-wound: Calloused wound without warmth, erythema, signs of acute infection Moderate sanguinous drainage on dressing Wounds without active warmth and erythema During debris in the wound bed today  No open wounds right foot - pre-ulcerative areas left 1st MPJ.  Pre-ulcer left heel Assessment:   1. Diabetic ulcer of left midfoot associated with type 2 diabetes mellitus, with fat layer exposed (HCC)     Plan:  Patient was evaluated and treated and all questions answered.  Ulcer left heel and midfoot; with OM of calcaneus and distal metatarsal -The more proximal wound is larger today.  He is wearing his shoes without any answers.  I did discuss that it is better to wear the inserts that he has rather than no inserts at all.  The wound was debrided dressed with Silvadene and foam border dressing.  Other calluses extremities with patient  Procedure: Selective Debridement of Wound Rationale: Removal of devitalized tissue from the wound to promote healing.  Pre-Debridement Wound Measurements: 2*1, 1*0.5 Post-Debridement Wound Measurements: same as pre-debridement. Type of Debridement: sharp selective Instrumentation: dermal curette, tissue nipper Tissue Removed: Devitalized soft-tissue Dressing: Dry, sterile, compression dressing. Disposition: Patient tolerated procedure well.   Painful neuropathy -We discussed treatments with patient he is currently on penicillin gabapentin I do not think medications he should  try

## 2021-08-22 NOTE — Telephone Encounter (Signed)
Patient was here on 9/30 and was to have some pain meds sent to the pharmacy. At this time meds have not be sent. Please send to.   [id number] - [place], [place] - [address] AT [facility] OF [address] & [facility] [address]

## 2021-08-29 ENCOUNTER — Ambulatory Visit (INDEPENDENT_AMBULATORY_CARE_PROVIDER_SITE_OTHER): Payer: Medicare Other | Admitting: Podiatry

## 2021-08-29 ENCOUNTER — Other Ambulatory Visit: Payer: Self-pay

## 2021-08-29 DIAGNOSIS — E11621 Type 2 diabetes mellitus with foot ulcer: Secondary | ICD-10-CM

## 2021-08-29 DIAGNOSIS — L97422 Non-pressure chronic ulcer of left heel and midfoot with fat layer exposed: Secondary | ICD-10-CM

## 2021-08-29 MED ORDER — OXYCODONE-ACETAMINOPHEN 10-325 MG PO TABS: 1.0000 | ORAL_TABLET | ORAL | 0 refills | Status: DC | PRN

## 2021-08-29 NOTE — Progress Notes (Signed)
  Subjective:  Patient ID: Charles Thompson, male    DOB: Nov 03, 1961,  MRN: [id number]  Chief Complaint  Patient presents with   Wound Check    Denies fever/chills/nausea/vomiting. No new concerns.   60 y.o. male presents for wound care. Hx confirmed with patient.  Denies new concerns states that the calluses have been hurting Objective:  Physical Exam: Wound Location: left foot distal, heel Wound Base: 1.5 x 1, 2 cm linear proximal distal respectively with skin bridge Peri-wound: Calloused wound without warmth, erythema, signs of acute infection Moderate sanguinous drainage on dressing Wounds without active warmth and erythema During debris in the wound bed today  No open wounds right foot - pre-ulcerative areas left 1st MPJ.  Pre-ulcer left heel Assessment:   1. Diabetic ulcer of left midfoot associated with type 2 diabetes mellitus, with fat layer exposed (HCC)      Plan:  Patient was evaluated and treated and all questions answered.  Ulcer left heel and midfoot; with OM of calcaneus and distal metatarsal -Wounds were cleansed and thoroughly debrided today.  Other calluses were trimmed per patient request.  Dressed with Silvadene and foam border dressing.  Discussed the importance of wearing his diabetic inserts  Procedure: Selective Debridement of Wound Rationale: Removal of devitalized tissue from the wound to promote healing.  Pre-Debridement Wound Measurements: 1.5 cm x 1 cm x 0.2 cm  Post-Debridement Wound Measurements: same as pre-debridement. Type of Debridement: sharp selective Instrumentation: dermal curette, tissue nipper Tissue Removed: Devitalized soft-tissue Dressing: Dry, sterile, compression dressing. Disposition: Patient tolerated procedure well.

## 2021-09-16 ENCOUNTER — Other Ambulatory Visit: Payer: Self-pay

## 2021-09-16 ENCOUNTER — Ambulatory Visit (INDEPENDENT_AMBULATORY_CARE_PROVIDER_SITE_OTHER): Payer: Medicare Other | Admitting: Podiatry

## 2021-09-16 DIAGNOSIS — E11621 Type 2 diabetes mellitus with foot ulcer: Secondary | ICD-10-CM

## 2021-09-16 DIAGNOSIS — L97422 Non-pressure chronic ulcer of left heel and midfoot with fat layer exposed: Secondary | ICD-10-CM

## 2021-09-16 DIAGNOSIS — L97411 Non-pressure chronic ulcer of right heel and midfoot limited to breakdown of skin: Secondary | ICD-10-CM | POA: Diagnosis not present

## 2021-09-16 MED ORDER — OXYCODONE-ACETAMINOPHEN 10-325 MG PO TABS: 1.0000 | ORAL_TABLET | ORAL | 0 refills | Status: DC | PRN

## 2021-09-16 NOTE — Progress Notes (Signed)
  Subjective:  Patient ID: Charles Thompson, male    DOB: 14-Oct-1961,  MRN: [id number]  Chief Complaint  Patient presents with   Diabetic Ulcer    Recheck left midfoot ulcer check. Pt states no changes, questions or concerns today.    60 y.o. male presents for wound care. Hx confirmed with patient. Thinks the sores are about the same, denies new issues. Objective:  Physical Exam: Wound Location: left midfoot Wound Base: 2*59m  Peri-wound: Calloused wound without warmth, erythema, signs of acute infection Mild sanguinous drainage on dressing Wounds without active warmth and erythema   No open wounds right foot - pre-ulcerative areas left 1st MPJ.  Pre-ulcer left heel Assessment:   1. Diabetic ulcer of left midfoot associated with type 2 diabetes mellitus, with fat layer exposed (HCC)   2. Diabetic ulcer of right midfoot associated with type 2 diabetes mellitus, limited to breakdown of skin (HCC)    Plan:  Patient was evaluated and treated and all questions answered.  Ulcer left heel and midfoot; with OM of calcaneus and distal metatarsal -Left foot wound debrided as below, PICO VAC -Pre-ulcer right dressed with silvadene and foam border dressing, ACE bandage. -Willl get appt for new DM shoe fabrication  Procedure: Selective Debridement of Wound Rationale: Removal of devitalized tissue from the wound to promote healing.  Pre-Debridement Wound Measurements: 2 cm x 2 cm x 0.4 cm  Post-Debridement Wound Measurements: same as pre-debridement. Type of Debridement: sharp selective Instrumentation: dermal curette, tissue nipper Tissue Removed: Devitalized soft-tissue Dressing: Dry, sterile, compression dressing. Disposition: Patient tolerated procedure well.  Procedure: Single Use Wound VAC Application Location: left midfoot Wound Measurement: 2*2 Technique: Padded dressing applied to wound, reinforced with draping. Device turned on with good seal noted. Disposition: Patient  tolerated procedure well.

## 2021-09-29 DIAGNOSIS — Z7984 Long term (current) use of oral hypoglycemic drugs: Secondary | ICD-10-CM

## 2021-09-29 DIAGNOSIS — Z833 Family history of diabetes mellitus: Secondary | ICD-10-CM

## 2021-09-29 DIAGNOSIS — Z20822 Contact with and (suspected) exposure to covid-19: Secondary | ICD-10-CM | POA: Diagnosis present

## 2021-09-29 DIAGNOSIS — E11610 Type 2 diabetes mellitus with diabetic neuropathic arthropathy: Secondary | ICD-10-CM | POA: Diagnosis present

## 2021-09-29 DIAGNOSIS — E1169 Type 2 diabetes mellitus with other specified complication: Secondary | ICD-10-CM | POA: Diagnosis present

## 2021-09-29 DIAGNOSIS — E11621 Type 2 diabetes mellitus with foot ulcer: Secondary | ICD-10-CM | POA: Diagnosis present

## 2021-09-29 DIAGNOSIS — N179 Acute kidney failure, unspecified: Secondary | ICD-10-CM | POA: Diagnosis present

## 2021-09-29 DIAGNOSIS — E11649 Type 2 diabetes mellitus with hypoglycemia without coma: Secondary | ICD-10-CM | POA: Diagnosis not present

## 2021-09-29 DIAGNOSIS — E1122 Type 2 diabetes mellitus with diabetic chronic kidney disease: Secondary | ICD-10-CM | POA: Diagnosis present

## 2021-09-29 DIAGNOSIS — Z89432 Acquired absence of left foot: Secondary | ICD-10-CM

## 2021-09-29 DIAGNOSIS — Z794 Long term (current) use of insulin: Secondary | ICD-10-CM

## 2021-09-29 DIAGNOSIS — Z66 Do not resuscitate: Secondary | ICD-10-CM | POA: Diagnosis present

## 2021-09-29 DIAGNOSIS — L03116 Cellulitis of left lower limb: Secondary | ICD-10-CM | POA: Diagnosis present

## 2021-09-29 DIAGNOSIS — E1142 Type 2 diabetes mellitus with diabetic polyneuropathy: Secondary | ICD-10-CM | POA: Diagnosis present

## 2021-09-29 DIAGNOSIS — A4159 Other Gram-negative sepsis: Principal | ICD-10-CM | POA: Diagnosis present

## 2021-09-29 DIAGNOSIS — R6520 Severe sepsis without septic shock: Secondary | ICD-10-CM | POA: Diagnosis present

## 2021-09-29 DIAGNOSIS — L97429 Non-pressure chronic ulcer of left heel and midfoot with unspecified severity: Secondary | ICD-10-CM | POA: Diagnosis present

## 2021-09-29 DIAGNOSIS — I129 Hypertensive chronic kidney disease with stage 1 through stage 4 chronic kidney disease, or unspecified chronic kidney disease: Secondary | ICD-10-CM | POA: Diagnosis present

## 2021-09-29 DIAGNOSIS — Z79899 Other long term (current) drug therapy: Secondary | ICD-10-CM

## 2021-09-29 DIAGNOSIS — Z89411 Acquired absence of right great toe: Secondary | ICD-10-CM

## 2021-09-29 DIAGNOSIS — L97529 Non-pressure chronic ulcer of other part of left foot with unspecified severity: Secondary | ICD-10-CM | POA: Diagnosis present

## 2021-09-29 DIAGNOSIS — F419 Anxiety disorder, unspecified: Secondary | ICD-10-CM | POA: Diagnosis present

## 2021-09-29 DIAGNOSIS — I440 Atrioventricular block, first degree: Secondary | ICD-10-CM | POA: Diagnosis present

## 2021-09-29 DIAGNOSIS — E11628 Type 2 diabetes mellitus with other skin complications: Secondary | ICD-10-CM | POA: Diagnosis present

## 2021-09-29 DIAGNOSIS — Z7982 Long term (current) use of aspirin: Secondary | ICD-10-CM

## 2021-09-29 DIAGNOSIS — K219 Gastro-esophageal reflux disease without esophagitis: Secondary | ICD-10-CM | POA: Diagnosis present

## 2021-09-29 DIAGNOSIS — F17210 Nicotine dependence, cigarettes, uncomplicated: Secondary | ICD-10-CM | POA: Diagnosis present

## 2021-09-29 DIAGNOSIS — F3340 Major depressive disorder, recurrent, in remission, unspecified: Secondary | ICD-10-CM | POA: Diagnosis present

## 2021-09-29 DIAGNOSIS — F39 Unspecified mood [affective] disorder: Secondary | ICD-10-CM | POA: Diagnosis present

## 2021-09-29 DIAGNOSIS — N182 Chronic kidney disease, stage 2 (mild): Secondary | ICD-10-CM | POA: Diagnosis present

## 2021-09-29 DIAGNOSIS — E785 Hyperlipidemia, unspecified: Secondary | ICD-10-CM | POA: Diagnosis present

## 2021-09-29 DIAGNOSIS — E1151 Type 2 diabetes mellitus with diabetic peripheral angiopathy without gangrene: Secondary | ICD-10-CM | POA: Diagnosis present

## 2021-09-29 DIAGNOSIS — L97519 Non-pressure chronic ulcer of other part of right foot with unspecified severity: Secondary | ICD-10-CM | POA: Diagnosis present

## 2021-09-29 DIAGNOSIS — M869 Osteomyelitis, unspecified: Secondary | ICD-10-CM | POA: Diagnosis present

## 2021-09-30 ENCOUNTER — Encounter (HOSPITAL_COMMUNITY): Payer: Self-pay

## 2021-09-30 ENCOUNTER — Inpatient Hospital Stay (HOSPITAL_COMMUNITY): Payer: Medicare Other

## 2021-09-30 ENCOUNTER — Emergency Department (HOSPITAL_COMMUNITY): Payer: Medicare Other

## 2021-09-30 ENCOUNTER — Other Ambulatory Visit: Payer: Self-pay

## 2021-09-30 ENCOUNTER — Inpatient Hospital Stay (HOSPITAL_COMMUNITY)
Admission: EM | Admit: 2021-09-30 | Discharge: 2021-10-03 | DRG: 854 | Discharge status: Against Medical Advice | Payer: Medicare Other | Attending: Internal Medicine | Admitting: Internal Medicine

## 2021-09-30 DIAGNOSIS — L03116 Cellulitis of left lower limb: Secondary | ICD-10-CM | POA: Diagnosis present

## 2021-09-30 DIAGNOSIS — Z66 Do not resuscitate: Secondary | ICD-10-CM | POA: Diagnosis present

## 2021-09-30 DIAGNOSIS — E1151 Type 2 diabetes mellitus with diabetic peripheral angiopathy without gangrene: Secondary | ICD-10-CM | POA: Diagnosis present

## 2021-09-30 DIAGNOSIS — F3340 Major depressive disorder, recurrent, in remission, unspecified: Secondary | ICD-10-CM | POA: Diagnosis present

## 2021-09-30 DIAGNOSIS — R7881 Bacteremia: Secondary | ICD-10-CM | POA: Diagnosis not present

## 2021-09-30 DIAGNOSIS — F419 Anxiety disorder, unspecified: Secondary | ICD-10-CM | POA: Diagnosis present

## 2021-09-30 DIAGNOSIS — M869 Osteomyelitis, unspecified: Secondary | ICD-10-CM | POA: Diagnosis present

## 2021-09-30 DIAGNOSIS — E11610 Type 2 diabetes mellitus with diabetic neuropathic arthropathy: Secondary | ICD-10-CM | POA: Diagnosis present

## 2021-09-30 DIAGNOSIS — Z89432 Acquired absence of left foot: Secondary | ICD-10-CM | POA: Diagnosis present

## 2021-09-30 DIAGNOSIS — L0390 Cellulitis, unspecified: Secondary | ICD-10-CM

## 2021-09-30 DIAGNOSIS — I440 Atrioventricular block, first degree: Secondary | ICD-10-CM | POA: Diagnosis present

## 2021-09-30 DIAGNOSIS — Z20822 Contact with and (suspected) exposure to covid-19: Secondary | ICD-10-CM | POA: Diagnosis present

## 2021-09-30 DIAGNOSIS — N179 Acute kidney failure, unspecified: Secondary | ICD-10-CM | POA: Diagnosis present

## 2021-09-30 DIAGNOSIS — L97529 Non-pressure chronic ulcer of other part of left foot with unspecified severity: Secondary | ICD-10-CM | POA: Diagnosis present

## 2021-09-30 DIAGNOSIS — E11628 Type 2 diabetes mellitus with other skin complications: Secondary | ICD-10-CM | POA: Diagnosis present

## 2021-09-30 DIAGNOSIS — E1140 Type 2 diabetes mellitus with diabetic neuropathy, unspecified: Secondary | ICD-10-CM

## 2021-09-30 DIAGNOSIS — L97429 Non-pressure chronic ulcer of left heel and midfoot with unspecified severity: Secondary | ICD-10-CM | POA: Diagnosis present

## 2021-09-30 DIAGNOSIS — E11621 Type 2 diabetes mellitus with foot ulcer: Secondary | ICD-10-CM

## 2021-09-30 DIAGNOSIS — F39 Unspecified mood [affective] disorder: Secondary | ICD-10-CM | POA: Diagnosis present

## 2021-09-30 DIAGNOSIS — E1142 Type 2 diabetes mellitus with diabetic polyneuropathy: Secondary | ICD-10-CM | POA: Diagnosis present

## 2021-09-30 DIAGNOSIS — A419 Sepsis, unspecified organism: Secondary | ICD-10-CM

## 2021-09-30 DIAGNOSIS — Z720 Tobacco use: Secondary | ICD-10-CM | POA: Diagnosis present

## 2021-09-30 DIAGNOSIS — L089 Local infection of the skin and subcutaneous tissue, unspecified: Secondary | ICD-10-CM | POA: Diagnosis not present

## 2021-09-30 DIAGNOSIS — E1169 Type 2 diabetes mellitus with other specified complication: Secondary | ICD-10-CM | POA: Diagnosis present

## 2021-09-30 DIAGNOSIS — R6520 Severe sepsis without septic shock: Secondary | ICD-10-CM | POA: Diagnosis not present

## 2021-09-30 DIAGNOSIS — E11649 Type 2 diabetes mellitus with hypoglycemia without coma: Secondary | ICD-10-CM | POA: Diagnosis not present

## 2021-09-30 DIAGNOSIS — E1122 Type 2 diabetes mellitus with diabetic chronic kidney disease: Secondary | ICD-10-CM | POA: Diagnosis present

## 2021-09-30 DIAGNOSIS — A4159 Other Gram-negative sepsis: Secondary | ICD-10-CM | POA: Diagnosis present

## 2021-09-30 DIAGNOSIS — R112 Nausea with vomiting, unspecified: Secondary | ICD-10-CM

## 2021-09-30 DIAGNOSIS — M86672 Other chronic osteomyelitis, left ankle and foot: Secondary | ICD-10-CM | POA: Diagnosis not present

## 2021-09-30 DIAGNOSIS — E785 Hyperlipidemia, unspecified: Secondary | ICD-10-CM | POA: Diagnosis present

## 2021-09-30 DIAGNOSIS — K219 Gastro-esophageal reflux disease without esophagitis: Secondary | ICD-10-CM | POA: Diagnosis present

## 2021-09-30 DIAGNOSIS — F17210 Nicotine dependence, cigarettes, uncomplicated: Secondary | ICD-10-CM | POA: Diagnosis present

## 2021-09-30 DIAGNOSIS — I129 Hypertensive chronic kidney disease with stage 1 through stage 4 chronic kidney disease, or unspecified chronic kidney disease: Secondary | ICD-10-CM | POA: Diagnosis present

## 2021-09-30 LAB — URINALYSIS, ROUTINE W REFLEX MICROSCOPIC
Bacteria, UA: NONE SEEN
Bilirubin Urine: NEGATIVE
Glucose, UA: >=500 mg/dL — AB
Ketones, ur: 5 mg/dL — AB
Leukocytes,Ua: NEGATIVE
Nitrite: NEGATIVE
Protein, ur: NEGATIVE mg/dL
Specific Gravity, Urine: 1.012 (ref 1.005–1.030)
pH: 5 (ref 5.0–8.0)

## 2021-09-30 LAB — CBC WITH DIFFERENTIAL/PLATELET
Abs Immature Granulocytes: 0.08 10*3/uL — ABNORMAL HIGH (ref 0.00–0.07)
Basophils Absolute: 0 10*3/uL (ref 0.0–0.1)
Basophils Relative: 0 %
Eosinophils Absolute: 0 10*3/uL (ref 0.0–0.5)
Eosinophils Relative: 0 %
HCT: 41.7 % (ref 39.0–52.0)
Hemoglobin: 14.5 g/dL (ref 13.0–17.0)
Immature Granulocytes: 1 %
Lymphocytes Relative: 11 %
Lymphs Abs: 1.5 10*3/uL (ref 0.7–4.0)
MCH: 28.1 pg (ref 26.0–34.0)
MCHC: 34.8 g/dL (ref 30.0–36.0)
MCV: 80.8 fL (ref 80.0–100.0)
Monocytes Absolute: 1.3 10*3/uL — ABNORMAL HIGH (ref 0.1–1.0)
Monocytes Relative: 9 %
Neutro Abs: 10.9 10*3/uL — ABNORMAL HIGH (ref 1.7–7.7)
Neutrophils Relative %: 79 %
Platelets: 309 10*3/uL (ref 150–400)
RBC: 5.16 MIL/uL (ref 4.22–5.81)
RDW: 13.1 % (ref 11.5–15.5)
WBC: 13.8 10*3/uL — ABNORMAL HIGH (ref 4.0–10.5)
nRBC: 0 % (ref 0.0–0.2)

## 2021-09-30 LAB — LACTIC ACID, PLASMA: Lactic Acid, Venous: 1.5 mmol/L (ref 0.5–1.9)

## 2021-09-30 LAB — HEMOGLOBIN A1C
Hgb A1c MFr Bld: 6.3 % — ABNORMAL HIGH (ref 4.8–5.6)
Mean Plasma Glucose: 134.11 mg/dL

## 2021-09-30 LAB — COMPREHENSIVE METABOLIC PANEL
ALT: 23 U/L (ref 0–44)
AST: 17 U/L (ref 15–41)
Albumin: 3.6 g/dL (ref 3.5–5.0)
Alkaline Phosphatase: 95 U/L (ref 38–126)
Anion gap: 16 — ABNORMAL HIGH (ref 5–15)
BUN: 28 mg/dL — ABNORMAL HIGH (ref 8–23)
CO2: 21 mmol/L — ABNORMAL LOW (ref 22–32)
Calcium: 8.8 mg/dL — ABNORMAL LOW (ref 8.9–10.3)
Chloride: 91 mmol/L — ABNORMAL LOW (ref 98–111)
Creatinine, Ser: 1.41 mg/dL — ABNORMAL HIGH (ref 0.61–1.24)
GFR, Estimated: 57 mL/min — ABNORMAL LOW (ref >60)
Glucose, Bld: 149 mg/dL — ABNORMAL HIGH (ref 70–99)
Potassium: 3.4 mmol/L — ABNORMAL LOW (ref 3.5–5.1)
Sodium: 128 mmol/L — ABNORMAL LOW (ref 135–145)
Total Bilirubin: 1.1 mg/dL (ref 0.3–1.2)
Total Protein: 7.8 g/dL (ref 6.5–8.1)

## 2021-09-30 LAB — CBG MONITORING, ED
Glucose-Capillary: 117 mg/dL — ABNORMAL HIGH (ref 70–99)
Glucose-Capillary: 143 mg/dL — ABNORMAL HIGH (ref 70–99)

## 2021-09-30 LAB — RESP PANEL BY RT-PCR (FLU A&B, COVID) ARPGX2
Influenza A by PCR: NEGATIVE
Influenza B by PCR: NEGATIVE
SARS Coronavirus 2 by RT PCR: NEGATIVE

## 2021-09-30 LAB — GLUCOSE, CAPILLARY: Glucose-Capillary: 151 mg/dL — ABNORMAL HIGH (ref 70–99)

## 2021-09-30 LAB — C-REACTIVE PROTEIN: CRP: 19.5 mg/dL — ABNORMAL HIGH (ref <1.0)

## 2021-09-30 LAB — SEDIMENTATION RATE: Sed Rate: 50 mm/hr — ABNORMAL HIGH (ref 0–16)

## 2021-09-30 LAB — PREALBUMIN: Prealbumin: 9.8 mg/dL — ABNORMAL LOW (ref 18–38)

## 2021-09-30 IMAGING — MR MR FOOT*L* WO/W CM
10 series · 40 of 40 positions shown · IV contrast (GADAVIST)
Comparison: Same day radiograph

CLINICAL DATA: Foot swelling, diabetic, osteomyelitis suspected, no
prior imaging

EXAM:
MRI OF THE LEFT FOREFOOT WITHOUT AND WITH CONTRAST
TECHNIQUE: Multiplanar, multisequence MR imaging of the left forefoot was
performed both before and after administration of intravenous
contrast.
CONTRAST:  10mL GADAVIST GADOBUTROL 1 MMOL/ML IV SOLN

[Series 3: T1 · oblique · left · 3.5mm · 0.47mm/px · 6 of 48 slices shown (1 of 3)]
[im 1/48]
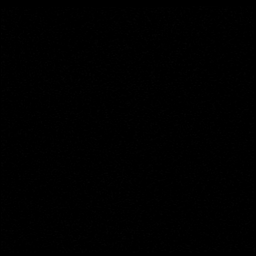
[im 10/48]
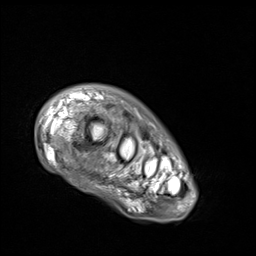
[im 19/48]
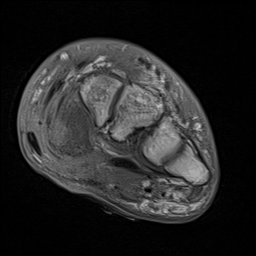
[im 29/48]
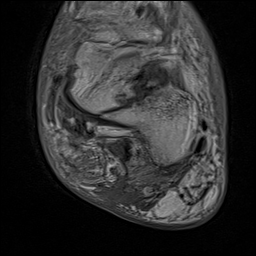
[im 38/48]
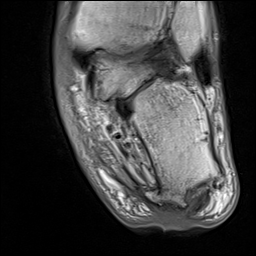
[im 48/48]
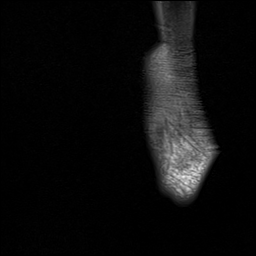

[Series 4: T2 fat-sat · oblique · left · 3.5mm · 0.38mm/px · 6 of 49 slices shown (1 of 2)]
[im 1/49]
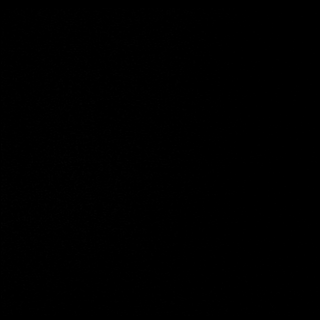
[im 10/49]
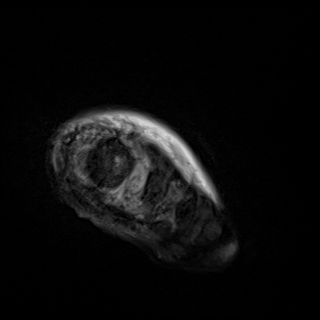
[im 20/49]
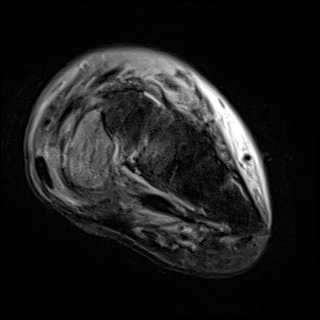
[im 29/49]
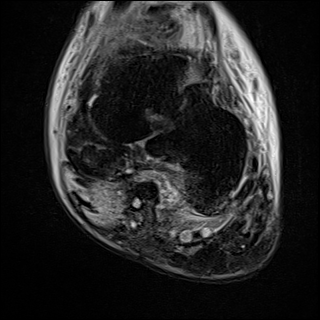
[im 39/49]
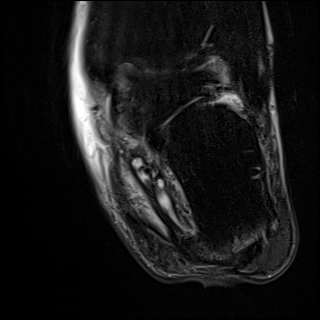
[im 49/49]
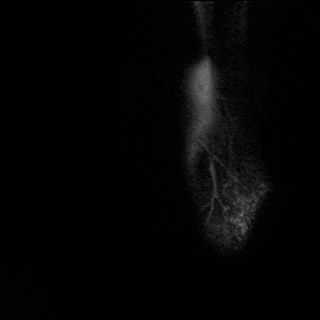

[Series 5: T2 fat-sat · axial · left · 3.0mm · 0.70mm/px · z∈[-33,+53]mm · 3 of 29 slices shown (2 of 2)]
[im 1/29]
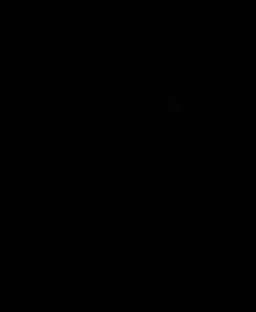
[im 15/29]
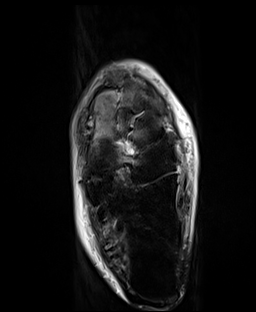
[im 29/29]
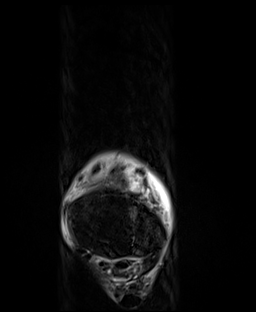

[Series 6: T1 · axial · left · 3.0mm · 0.70mm/px · z∈[-33,+53]mm · 3 of 29 slices shown (2 of 3)]
[im 1/29]
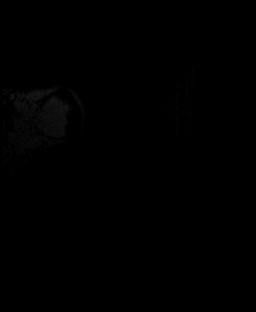
[im 15/29]
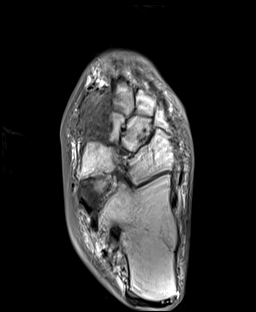
[im 29/29]
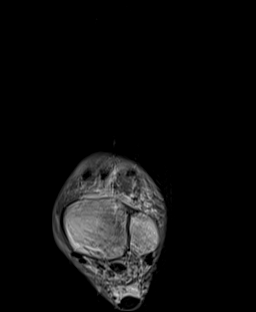

[Series 7: STIR · oblique · left · 3.0mm · 0.35mm/px · 3 of 30 slices shown]
[im 1/30]
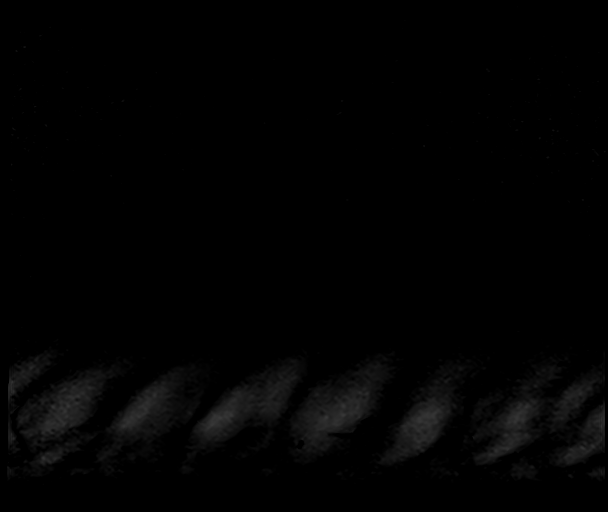
[im 15/30]
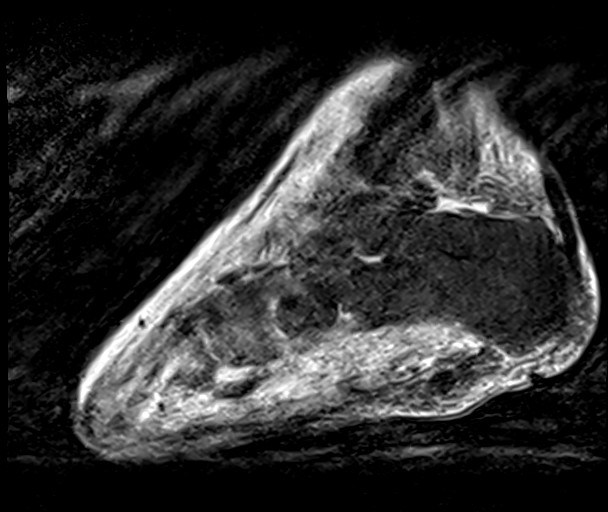
[im 30/30]
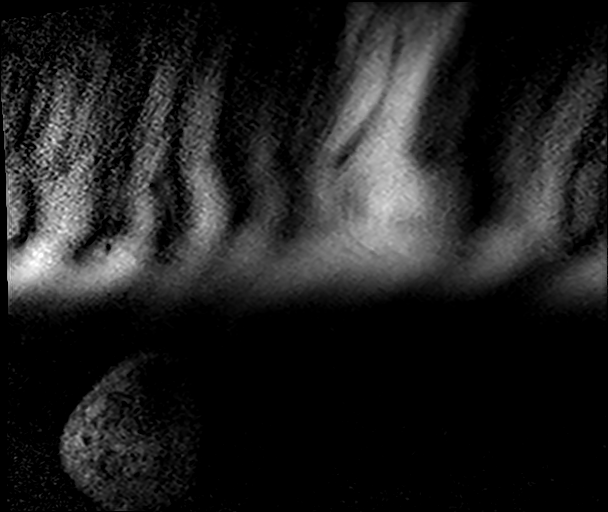

[Series 8: T1 · axial · left · 3.0mm · 0.70mm/px · z∈[-33,+53]mm · 3 of 29 slices shown (3 of 3)]
[im 1/29]
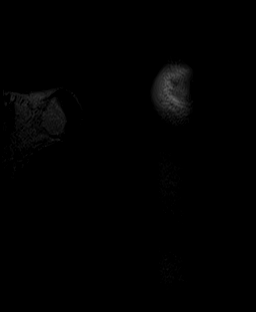
[im 15/29]
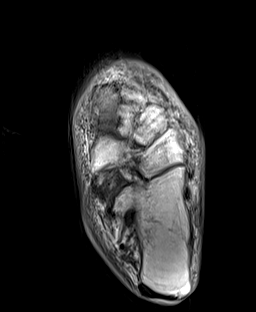
[im 29/29]
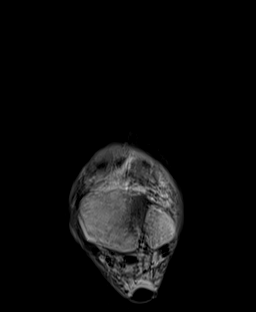

[Series 9: T1 fat-sat · oblique · non-contrast · left · 3.5mm · 0.47mm/px · 5 of 48 slices shown]
[im 1/48]
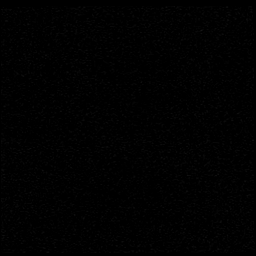
[im 12/48]
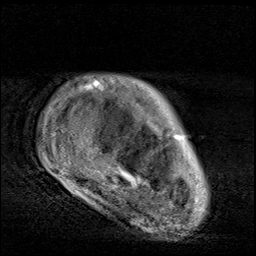
[im 24/48]
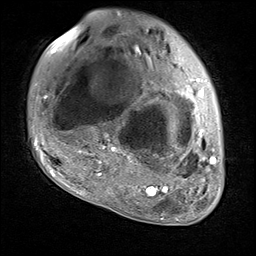
[im 36/48]
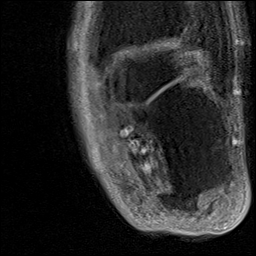
[im 48/48]
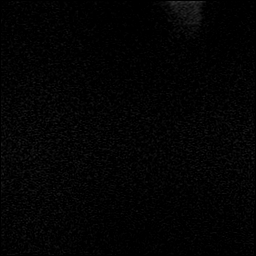

[Series 10: T1 fat-sat post-contrast · oblique · left · 3.5mm · 0.62mm/px · 5 of 48 slices shown (1 of 3)]
[im 1/48]
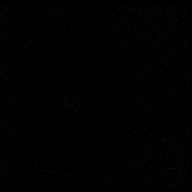
[im 12/48]
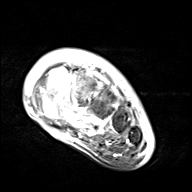
[im 24/48]
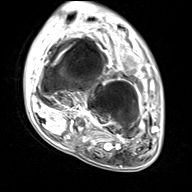
[im 36/48]
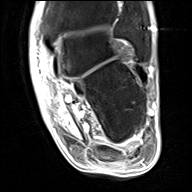
[im 48/48]
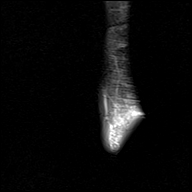

[Series 11: T1 fat-sat post-contrast · oblique · left · 3.0mm · 0.47mm/px · 3 of 32 slices shown (2 of 3)]
[im 1/32]
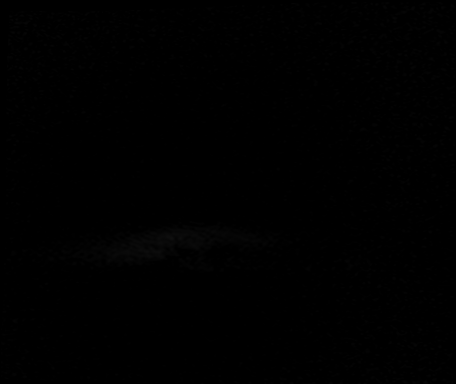
[im 16/32]
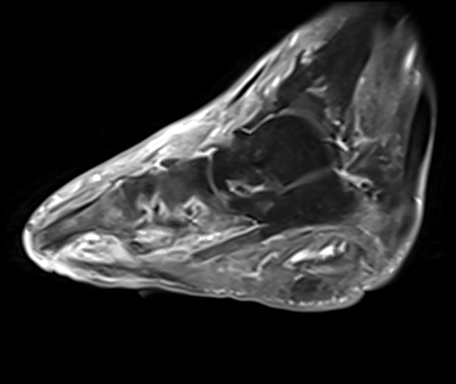
[im 32/32]
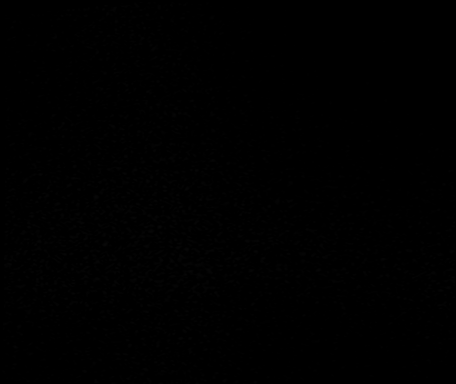

[Series 12: T1 fat-sat post-contrast · axial · left · 3.0mm · 0.70mm/px · z∈[-32,+54]mm · 3 of 29 slices shown (3 of 3)]
[im 1/29]
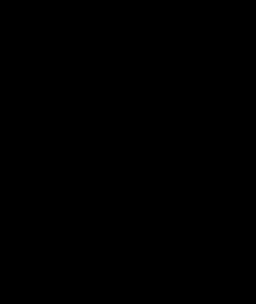
[im 15/29]
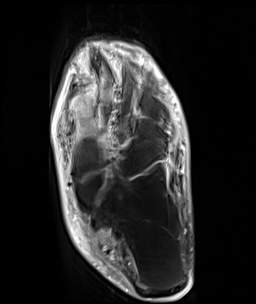
[im 29/29]
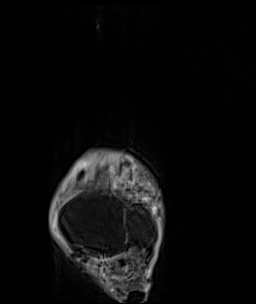

[40 of 40 positions shown; findings below may reference images not displayed]

FINDINGS: Bones/Joint/Cartilage

Prior transmetatarsal amputation. There is intense bony edema and
enhancement with confluent low T1 signal within the medial
cuneiform.

Bony edema and enhancement within the base of the residual second
metatarsal and medial aspect of the middle cuneiform.

Ligaments

The Lisfranc ligament is present and intact.

Muscles and Tendons

Diffuse intramuscular edema and atrophy in the foot as is commonly
seen in diabetics. Fraying at the distal attachment of the peroneal
longus tendon.

Soft tissues

There is a plantar medial soft tissue ulcer which extends to the
surface of the medial cuneiform, with internal fluid signal. This
appears to connect with the skin surface and is likely draining. No
other nondraining rim enhancing abscess in the foot.
IMPRESSION: Osteomyelitis of the medial cuneiform. Probable early osteomyelitis
of the base of the second metatarsal and medial aspect of the middle
cuneiform. Adjacent plantar medial soft tissue ulcer, with
continuous extension to the plantar surface of the medial cuneiform
and is likely draining, correlate with exam. No other nondraining
rim enhancing abscess identified.

Diffuse soft tissue swelling and enhancement consistent with
cellulitis.

## 2021-09-30 IMAGING — DX DG FOOT COMPLETE 3+V*L*
3 series · 3 of 3 positions shown · non-contrast
Comparison: [DATE]

CLINICAL DATA: Diabetic foot ulcer, evaluate for possible
osteomyelitis.

EXAM:
LEFT FOOT - COMPLETE 3+ VIEW

[foot ap]
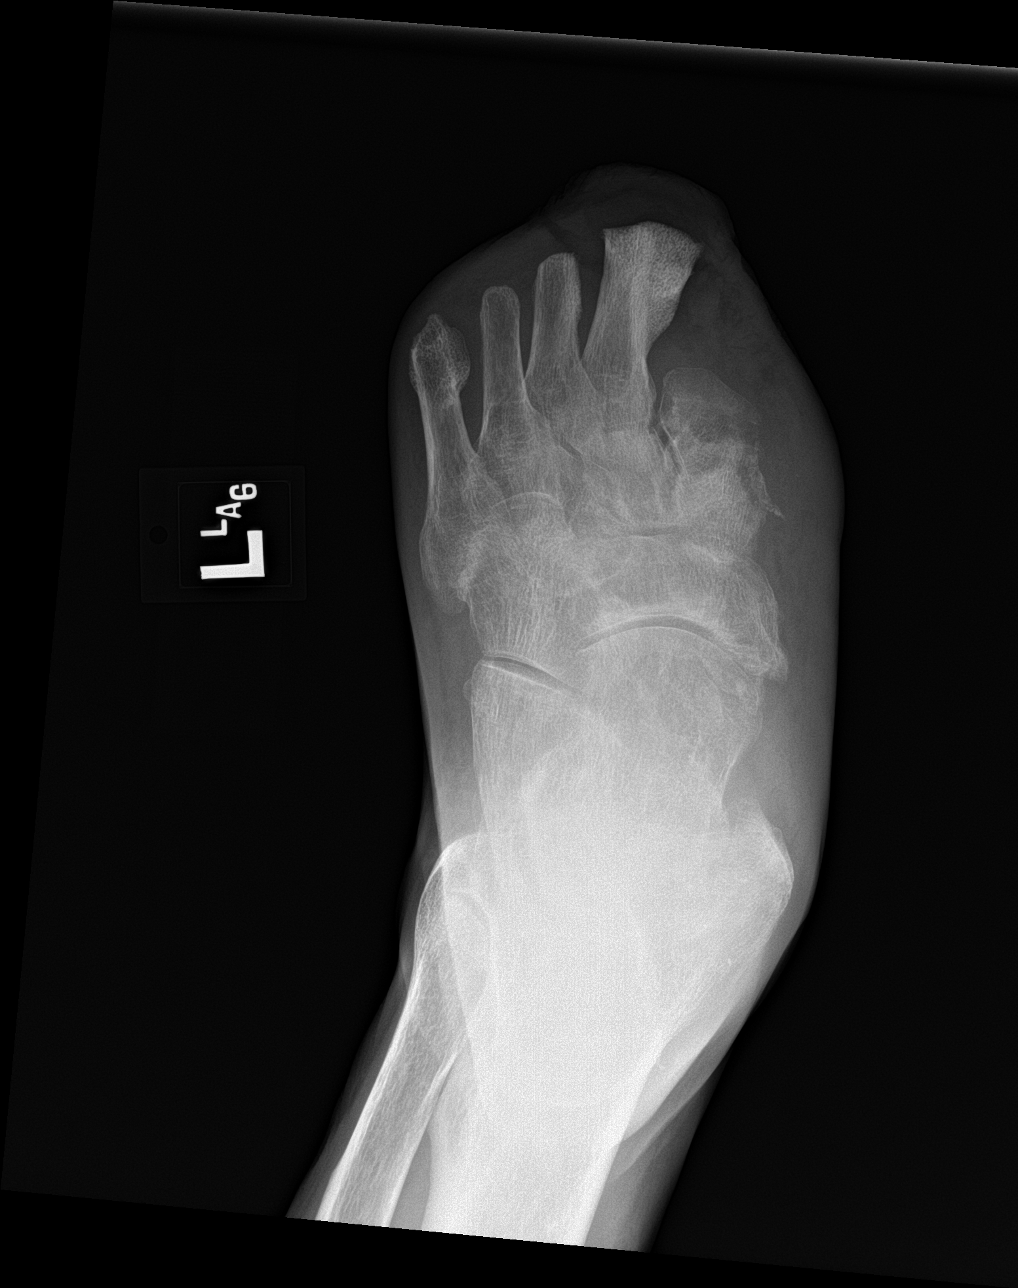

[foot obl]
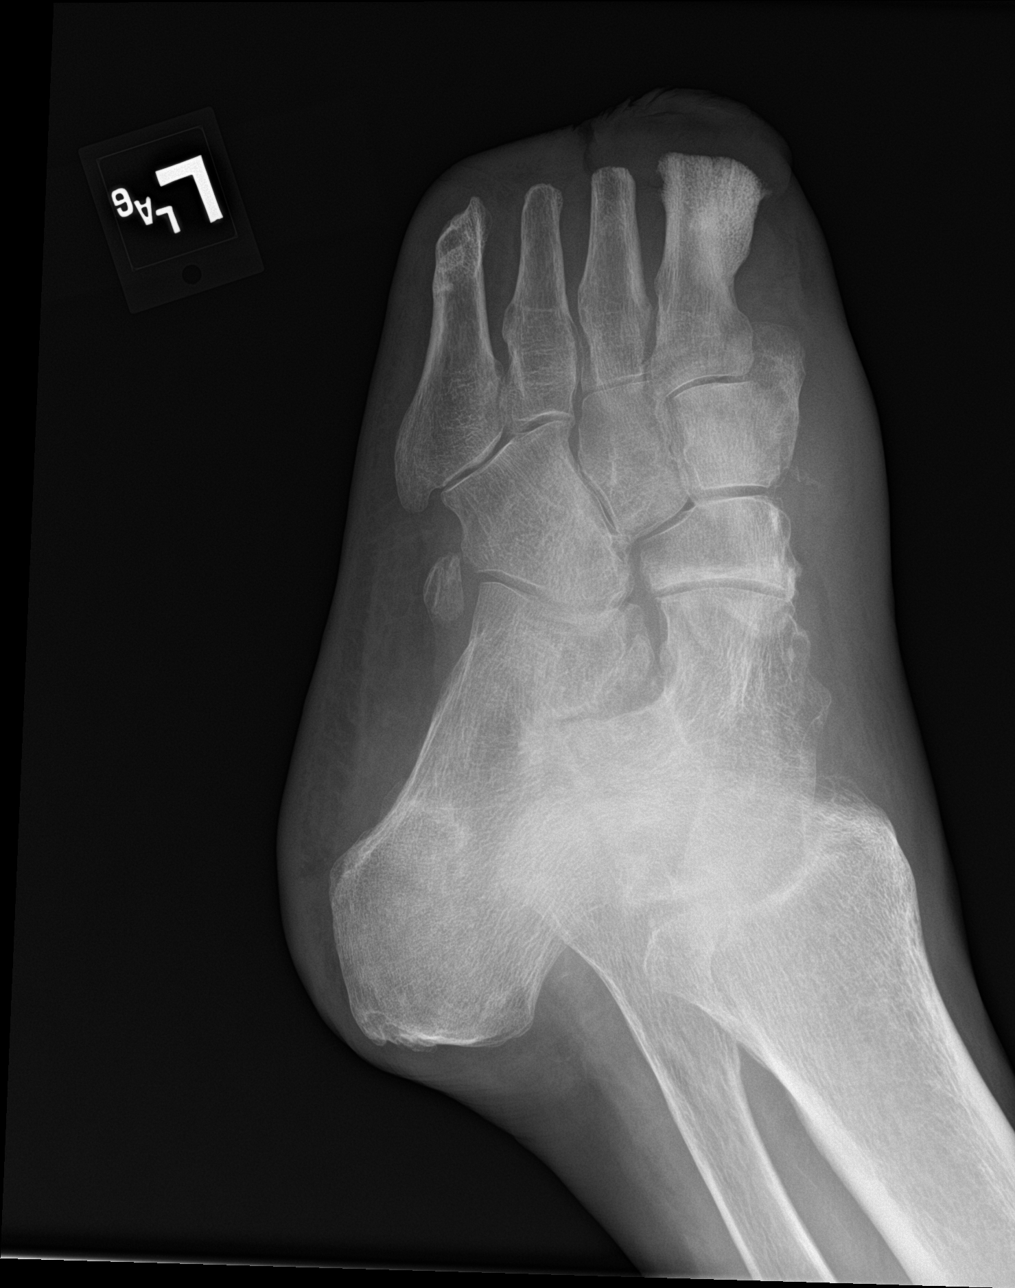

[foot lat]
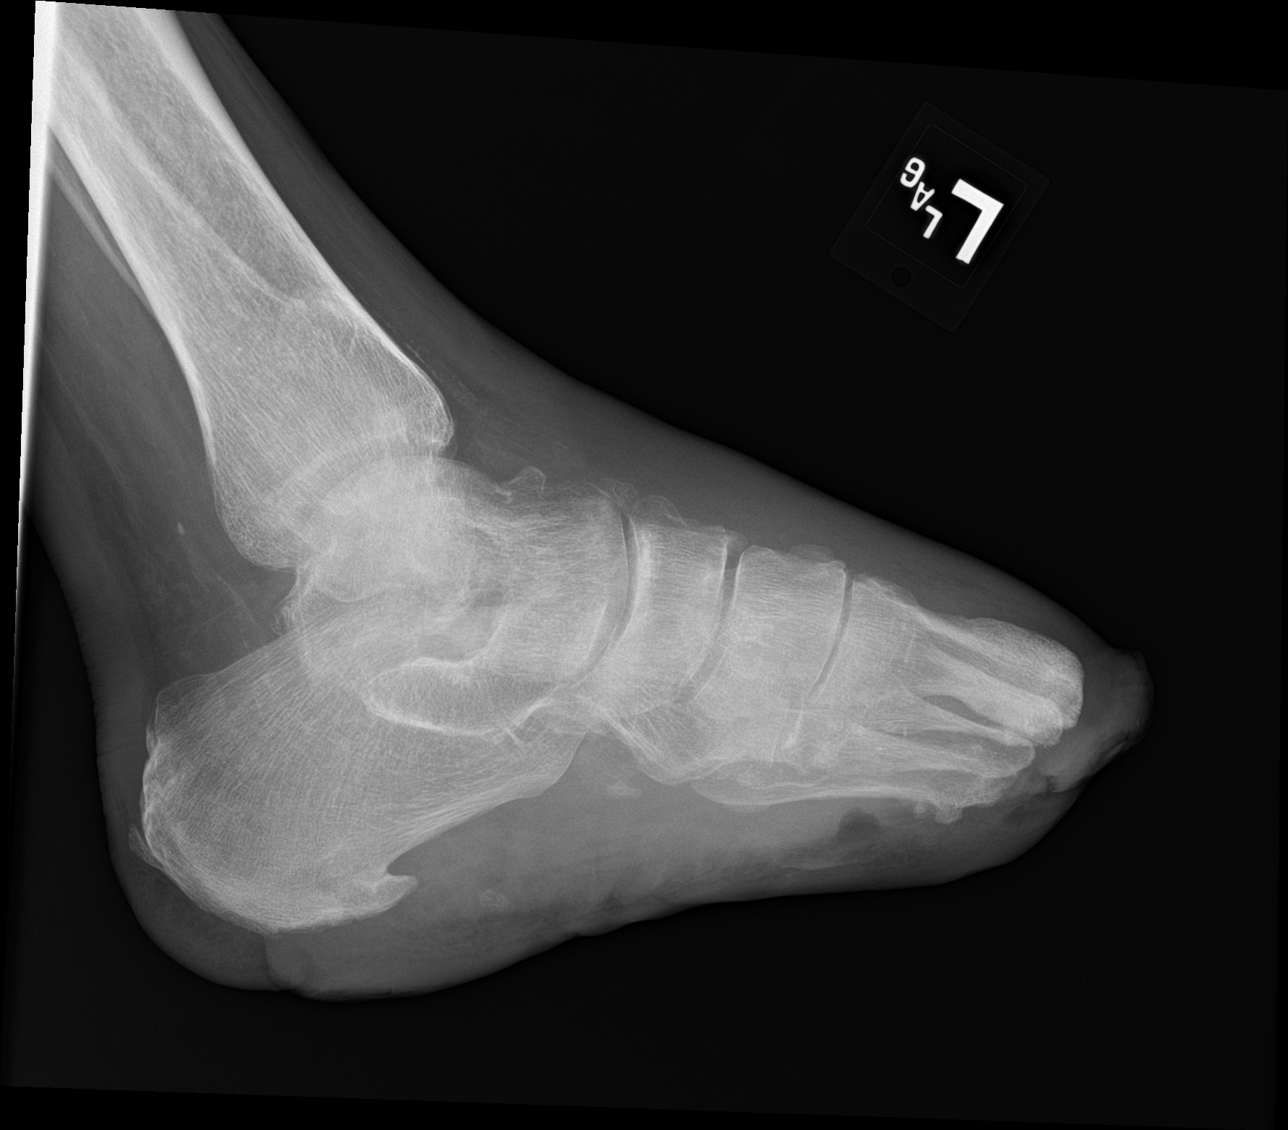

[3 of 3 positions shown; findings below may reference images not displayed]

FINDINGS: There are changes consistent with prior transmetatarsal amputation
involving the second through fifth metatarsals. Near complete
amputation of the first metatarsal is seen. Tarsal degenerative
changes are noted. Soft tissue ulcer is noted along the inferior
aspect of the foot and along the soft tissues near the amputation
site. No definitive osteomyelitis is noted. No bony erosive changes
are seen.
IMPRESSION: Soft tissue defects consistent with the given clinical history.

Postsurgical changes without evidence of osteomyelitis.

## 2021-09-30 MED ORDER — SODIUM CHLORIDE 0.9 % IV SOLN
2.0000 g | INTRAVENOUS | Status: DC
  Administered 2021-09-30 – 2021-10-03 (×4): 2 g via INTRAVENOUS
  Filled 2021-09-30 (×5): qty 20

## 2021-09-30 MED ORDER — VANCOMYCIN HCL IN DEXTROSE 1-5 GM/200ML-% IV SOLN
1000.0000 mg | Freq: Once | INTRAVENOUS | Status: AC
  Administered 2021-09-30: 1000 mg via INTRAVENOUS
  Filled 2021-09-30: qty 200

## 2021-09-30 MED ORDER — LACTATED RINGERS IV BOLUS
1000.0000 mL | Freq: Once | INTRAVENOUS | Status: AC
  Administered 2021-09-30: 1000 mL via INTRAVENOUS

## 2021-09-30 MED ORDER — HYDRALAZINE HCL 20 MG/ML IJ SOLN: 5.0000 mg | INTRAMUSCULAR | Status: DC | PRN

## 2021-09-30 MED ORDER — ACETAMINOPHEN 650 MG RE SUPP: 650.0000 mg | Freq: Four times a day (QID) | RECTAL | Status: DC | PRN

## 2021-09-30 MED ORDER — METRONIDAZOLE 500 MG/100ML IV SOLN
500.0000 mg | Freq: Two times a day (BID) | INTRAVENOUS | Status: DC
  Administered 2021-09-30 – 2021-10-01 (×4): 500 mg via INTRAVENOUS
  Filled 2021-09-30 (×5): qty 100

## 2021-09-30 MED ORDER — SODIUM CHLORIDE 0.9 % IV SOLN: INTRAVENOUS | Status: DC

## 2021-09-30 MED ORDER — INSULIN ASPART 100 UNIT/ML IJ SOLN
0.0000 [IU] | Freq: Three times a day (TID) | INTRAMUSCULAR | Status: DC
  Administered 2021-10-01: 3 [IU] via SUBCUTANEOUS
  Administered 2021-10-01: 2 [IU] via SUBCUTANEOUS
  Administered 2021-10-03: 3 [IU] via SUBCUTANEOUS
  Administered 2021-10-03: 2 [IU] via SUBCUTANEOUS
  Filled 2021-09-30: qty 0.15

## 2021-09-30 MED ORDER — VANCOMYCIN HCL IN DEXTROSE 1-5 GM/200ML-% IV SOLN
1000.0000 mg | Freq: Two times a day (BID) | INTRAVENOUS | Status: DC
  Administered 2021-09-30 – 2021-10-02 (×4): 1000 mg via INTRAVENOUS
  Filled 2021-09-30 (×5): qty 200

## 2021-09-30 MED ORDER — ONDANSETRON HCL 4 MG/2ML IJ SOLN: 4.0000 mg | Freq: Four times a day (QID) | INTRAMUSCULAR | Status: DC | PRN

## 2021-09-30 MED ORDER — ONDANSETRON HCL 4 MG/2ML IJ SOLN
4.0000 mg | Freq: Once | INTRAMUSCULAR | Status: AC
  Administered 2021-09-30: 4 mg via INTRAVENOUS
  Filled 2021-09-30: qty 2

## 2021-09-30 MED ORDER — INSULIN ASPART 100 UNIT/ML IJ SOLN
0.0000 [IU] | Freq: Every day | INTRAMUSCULAR | Status: DC
  Administered 2021-10-02: 23:00:00 2 [IU] via SUBCUTANEOUS
  Filled 2021-09-30: qty 0.05

## 2021-09-30 MED ORDER — INSULIN GLARGINE-YFGN 100 UNIT/ML ~~LOC~~ SOLN
50.0000 [IU] | Freq: Two times a day (BID) | SUBCUTANEOUS | Status: DC
Start: 2021-09-30 — End: 2021-10-02
  Administered 2021-09-30 – 2021-10-01 (×2): 50 [IU] via SUBCUTANEOUS
  Filled 2021-09-30 (×5): qty 0.5

## 2021-09-30 MED ORDER — ENOXAPARIN SODIUM 40 MG/0.4ML IJ SOSY
40.0000 mg | PREFILLED_SYRINGE | Freq: Every day | INTRAMUSCULAR | Status: DC
  Administered 2021-09-30 – 2021-10-01 (×2): 40 mg via SUBCUTANEOUS
  Filled 2021-09-30 (×2): qty 0.4

## 2021-09-30 MED ORDER — SERTRALINE HCL 100 MG PO TABS
100.0000 mg | ORAL_TABLET | Freq: Two times a day (BID) | ORAL | Status: DC
  Administered 2021-09-30 – 2021-10-03 (×7): 100 mg via ORAL
  Filled 2021-09-30: qty 1
  Filled 2021-09-30: qty 2
  Filled 2021-09-30 (×5): qty 1

## 2021-09-30 MED ORDER — ACETAMINOPHEN 325 MG PO TABS: 650.0000 mg | ORAL_TABLET | Freq: Four times a day (QID) | ORAL | Status: DC | PRN

## 2021-09-30 MED ORDER — AMLODIPINE BESYLATE 10 MG PO TABS
10.0000 mg | ORAL_TABLET | Freq: Every day | ORAL | Status: DC
  Administered 2021-10-01 – 2021-10-03 (×3): 10 mg via ORAL
  Filled 2021-09-30: qty 2
  Filled 2021-09-30 (×3): qty 1

## 2021-09-30 MED ORDER — BISACODYL 5 MG PO TBEC: 5.0000 mg | DELAYED_RELEASE_TABLET | Freq: Every day | ORAL | Status: DC | PRN

## 2021-09-30 MED ORDER — MORPHINE SULFATE (PF) 2 MG/ML IV SOLN: 2.0000 mg | INTRAVENOUS | Status: DC | PRN

## 2021-09-30 MED ORDER — HYDROCODONE-ACETAMINOPHEN 5-325 MG PO TABS
1.0000 | ORAL_TABLET | ORAL | Status: DC | PRN
  Administered 2021-10-01 – 2021-10-02 (×3): 1 via ORAL
  Filled 2021-09-30 (×3): qty 1

## 2021-09-30 MED ORDER — ASPIRIN EC 81 MG PO TBEC
81.0000 mg | DELAYED_RELEASE_TABLET | Freq: Every day | ORAL | Status: DC
  Administered 2021-09-30 – 2021-10-03 (×4): 81 mg via ORAL
  Filled 2021-09-30 (×4): qty 1

## 2021-09-30 MED ORDER — ATORVASTATIN CALCIUM 10 MG PO TABS
10.0000 mg | ORAL_TABLET | Freq: Every day | ORAL | Status: DC
  Administered 2021-09-30 – 2021-10-03 (×4): 10 mg via ORAL
  Filled 2021-09-30 (×4): qty 1

## 2021-09-30 MED ORDER — NICOTINE 21 MG/24HR TD PT24
21.0000 mg | MEDICATED_PATCH | Freq: Every day | TRANSDERMAL | Status: DC
  Administered 2021-09-30 – 2021-10-03 (×4): 21 mg via TRANSDERMAL
  Filled 2021-09-30 (×4): qty 1

## 2021-09-30 MED ORDER — INSULIN GLARGINE (1 UNIT DIAL) 300 UNIT/ML ~~LOC~~ SOPN: 50.0000 [IU] | PEN_INJECTOR | SUBCUTANEOUS | Status: DC

## 2021-09-30 MED ORDER — CLONAZEPAM 1 MG PO TABS: 1.0000 mg | ORAL_TABLET | Freq: Every evening | ORAL | Status: DC | PRN

## 2021-09-30 MED ORDER — POLYETHYLENE GLYCOL 3350 17 G PO PACK: 17.0000 g | PACK | Freq: Every day | ORAL | Status: DC | PRN

## 2021-09-30 MED ORDER — DOCUSATE SODIUM 100 MG PO CAPS
100.0000 mg | ORAL_CAPSULE | Freq: Two times a day (BID) | ORAL | Status: DC
  Administered 2021-09-30 – 2021-10-03 (×5): 100 mg via ORAL
  Filled 2021-09-30 (×6): qty 1

## 2021-09-30 MED ORDER — GADOBUTROL 1 MMOL/ML IV SOLN
10.0000 mL | Freq: Once | INTRAVENOUS | Status: AC | PRN
  Administered 2021-09-30: 10 mL via INTRAVENOUS

## 2021-09-30 MED ORDER — LORAZEPAM 0.5 MG PO TABS
0.5000 mg | ORAL_TABLET | Freq: Once | ORAL | Status: AC
  Administered 2021-09-30: 0.5 mg via ORAL
  Filled 2021-09-30: qty 1

## 2021-09-30 MED ORDER — ONDANSETRON HCL 4 MG PO TABS: 4.0000 mg | ORAL_TABLET | Freq: Four times a day (QID) | ORAL | Status: DC | PRN

## 2021-09-30 NOTE — Progress Notes (Signed)
Pharmacy Antibiotic Note  Charles Thompson is a 60 y.o. male admitted on 09/30/2021 with cellulitis.  Pharmacy has been consulted for vancomycin dosing for a duration of 7 days.  11/15 0247 Vancomycin 1 g IV x 1 administered In addition to vancomycin consult, Rocephin 2 g IV every 24 hours and Flagyl 500 mg IV every 12 hours x 7 days also ordered.  Plan: Vancomycin 1000 mg IV Q 12 hrs (Goal AUC 400-550, Expected AUC: 510.5, SCr used: 1.41) x 7 days Monitor clinical progress, renal function, vancomycin levels if indicated F/U C&S, abx deescalation / LOT   Height: 6' (182.9 cm) Weight: 99.8 kg (220 lb) IBW/kg (Calculated) : 77.6  Temp (24hrs), Avg:98.1 F (36.7 C), Min:98.1 F (36.7 C), Max:98.1 F (36.7 C)  Recent Labs  Lab 09/30/21 0110 09/30/21 0130  WBC  --  13.8*  CREATININE  --  1.41*  LATICACIDVEN 1.5  --     Estimated Creatinine Clearance: 67.3 mL/min (A) (by C-G formula based on SCr of 1.41 mg/dL (H)).    No Known Allergies  Antimicrobials this admission: 11/15 vancomycin >>  11/15 Rocephin >>  11/15 Flagyl >>  Dose adjustments this admission:   Microbiology results: 11/15 BCx: pending 11/15 left foot wound Cx: pending  Thank you for allowing pharmacy to be a part of this patient's care.  Selinda Eon, PharmD, BCPS Clinical Pharmacist  Please utilize Amion for appropriate phone number to reach the unit pharmacist [facility]) 09/30/2021 10:16 AM

## 2021-09-30 NOTE — ED Notes (Addendum)
Verified with lab, Pre-Albumin level able to be added on at this time.

## 2021-09-30 NOTE — ED Notes (Signed)
Pt aware of urine sample. Urinal in hand 

## 2021-09-30 NOTE — Consult Note (Signed)
WOC Nurse Consult Note: Reason for Consult:Chronic, nonhealing wounds to the left foot as well as new (since 09/16/21) wounds to right foot, plantar aspect. Patient was last seen in the community on 09/16/21 by Podiatric Medicine, Dr. Judie Petit. Samuella Cota.  Wound type: Charcot foot deformity on left, other deformity and amputation of great toe history on right. Callus formation at 1st, 4th and 5th metatarsal head on right plantar foot with subcutaneous bleeding at 1st (trauma). Pressure Injury POA: N/A Measurement:See measurements for left foot from Dr. Kandice Hams note on 11/1. Bedside RN to measure right foot wounds and document on Nursing Flow Sheet. Wound bed: Left: craters at forefoot  and midfoot are red, moist with surrounding callus. Small circular callus at hindfoot. Right: Callus at 5th, 4th and 1st metatarsal heads, the most severe is the 1st where there is a small amount of bleeding beneath the skin surface.  Drainage (amount, consistency, odor):  Left: small dark red Right: none Periwound: Area on plantar feet are calloused and with other hyper keratoses. Dressing procedure/placement/frequency: I will implement a conservative POC for the plantar wounds on the right: consisting of an antimicrobial nonadherent dressing (xeroform gauze) placed after cleansing and thorough, but gentle drying. An ABD pad will be placed for both absorbency and comfort and the dressings secured with Kerlix roll gauze/paper tape. We will change once daily. For the left plantar wounds, as incisional NPWT has been used recently, I will use a silver hydrofiber dressing (Aquacel Ag+ Advantage, [facility] # [id number])  This will be topped with an ABD pad and secured with Kerlix as above.  Recommend either consultation with Dr. Samuella Cota (Podiatry) while in house or in follow up as directed prior to admission.  WOC nursing team will not follow, but will remain available to this patient, the nursing and medical teams.  Please re-consult if  needed. Thanks, Ladona Mow, MSN, RN, GNP, [place]  Pager# [phone]

## 2021-09-30 NOTE — ED Provider Notes (Signed)
[facility] DEPT Provider Note   CSN: [id number] Arrival date & time: 09/29/21  2356     History Chief Complaint  Patient presents with   Wound Infection    Charles Thompson is a 61 y.o. male.  The history is provided by the patient.  He has history of hypertension, diabetes, hyperlipidemia, chronic kidney disease, status post midfoot amputation of the left foot and comes in with 1 week history of nausea, vomiting, chills.  He denies fever, but has had some sweats.  He is not hurting anywhere.  He has been treated for an ulcer on his left foot, and noted some foul-smelling drainage tonight.   Past Medical History:  Diagnosis Date   Anxiety    Arthritis    neck   CKD (chronic kidney disease), stage II    Diabetic peripheral neuropathy ([place])    Diabetic ulcer of left foot (HCC)    First degree heart block    GERD (gastroesophageal reflux disease)    History of cellulitis 09/2020   left lower leg   History of osteomyelitis    bilateral feet   Hyperlipidemia    Hypertension    followed by pcp  (12-14--2021 per pt never had a stress test)   MDD (major depressive disorder)    Type 2 diabetes mellitus with insulin therapy ([place])    followed by pcp   (01-17-2021  pt stated checks blood sugar 6 times weekly in am,  fasting sugar--- 120)    Patient Active Problem List   Diagnosis Date Noted   Ulcer of left midfoot with necrosis of muscle ([place])    Ulcer of heel, left, with necrosis of bone ([place])    Encounter for planned post-operative wound closure    Surgical wound present    Skin ulcer of left heel with necrosis of muscle ([place])    Abscess of left heel    Acute osteomyelitis of calcaneum, left ([place])    Streptococcal bacteremia    Cellulitis 10/10/2020   Hyponatremia 10/10/2020   Obesity (BMI 30.0-34.9) 10/10/2020   Hypoalbuminemia 10/10/2020   Sepsis ([place]) 10/10/2020   AKI (acute kidney injury) ([place]) 10/10/2020   Insomnia secondary to  restless leg syndrome 07/31/2020   Right lumbar radiculopathy 06/20/2019   Major depressive disorder, recurrent episode, in remission with seasonal pattern ([place]) 02/15/2019   History of transmetatarsal amputation of left foot ([place]) 10/19/2018   Osteomyelitis of second toe of right foot ([place])    Diabetic foot infection ([place]) 06/20/2018   Diabetic ulcer of left foot associated with type 2 diabetes mellitus ([place])    Tobacco abuse 12/02/2017   Status post amputation of right great toe ([place]) 10/29/2016   Type 2 diabetes mellitus with diabetic neuropathy, with long-term current use of insulin ([place]) 12/05/2015   Skin ulcer of left foot with fat layer exposed ([place]) 10/31/2015    Past Surgical History:  Procedure Laterality Date   ALLOGRAFT APPLICATION Right [id number]   Procedure: APPLICATION OF INTEGRA;  Surgeon: Evelina Bucy, DPM;  Location: [place] [facility];  Service: Podiatry;  Laterality: Right;   AMPUTATION Left 06/22/2018   Procedure: LEFT FOOT FIRST RAY AMPUTATION;  Surgeon: Newt Minion, MD;  Location: [facility];  Service: Orthopedics;  Laterality: Left;   AMPUTATION Left 09/09/2018   Procedure: Left Transmetatarsal Amputation;  Surgeon: Newt Minion, MD;  Location: [facility];  Service: Orthopedics;  Laterality: Left;   BONE BIOPSY Left 10/20/2020   Procedure: BONE BIOPSY;  Surgeon: Evelina Bucy, DPM;  Location: [facility];  Service: Podiatry;  Laterality: Left;   EYE SURGERY     I & D EXTREMITY Left 10/13/2020   Procedure: IRRIGATION AND DEBRIDEMENT FOOT;  Surgeon: Edrick Kins, DPM;  Location: [facility];  Service: Podiatry;  Laterality: Left;   I & D EXTREMITY Left 10/10/2020   Procedure: IRRIGATION AND DEBRIDEMENT OF FOOT;  Surgeon: Edrick Kins, DPM;  Location: [facility];  Service: Podiatry;  Laterality: Left;   I & D EXTREMITY Left 10/20/2020   Procedure: IRRIGATION AND DEBRIDEMENT OF COMPLEX WOUND HEEL;  Surgeon: Evelina Bucy, DPM;  Location: [place] [facility];  Service: Podiatry;   Laterality: Left;   IRRIGATION AND DEBRIDEMENT FOOT Left 01/22/2021   Procedure: Left foot debridement of wounds, calcaneal bone biopsy, skin graft substitute;  Surgeon: Evelina Bucy, DPM;  Location: [facility];  Service: Podiatry;  Laterality: Left;   TENDON LENGTHENING Right 03/19/2020   Procedure: TENDOACHILLES LENGTHENING;  Surgeon: Evelina Bucy, DPM;  Location: [facility];  Service: Podiatry;  Laterality: Right;   TOE AMPUTATION Right 12-11-2015  @[facility]    great toe   WOUND DEBRIDEMENT Right 03/19/2020   Procedure: DEBRIDEMENT WOUND;  Surgeon: Evelina Bucy, DPM;  Location: [facility];  Service: Podiatry;  Laterality: Right;   WOUND DEBRIDEMENT Left 10/31/2020   Procedure: DEBRIDEMENT WOUNDS LEFT FOOT WITH  CLOSURE OF MIDFOOT WOUND;  Surgeon: Evelina Bucy, DPM;  Location: [facility];  Service: Podiatry;  Laterality: Left;   WOUND DEBRIDEMENT Left 12/18/2020   Procedure: DEBRIDEMENT WOUND/COMPLEX WOUND CLOSURE OF HEEL;  Surgeon: Evelina Bucy, DPM;  Location: [facility];  Service: Podiatry;  Laterality: Left;       Family History  Problem Relation Age of Onset   Diabetes Other     Social History   Tobacco Use   Smoking status: Every Day    Packs/day: 0.75    Types: Cigarettes   Smokeless tobacco: Never  Vaping Use   Vaping Use: Never used  Substance Use Topics   Alcohol use: Yes    Alcohol/week: 0.0 standard drinks    Comment: occ   Drug use: Not Currently    Types: Marijuana    Comment: "not lately"    Home Medications Prior to Admission medications   Medication Sig Start Date End Date Taking? Authorizing Provider  amLODipine (NORVASC) 10 MG tablet Take 10 mg by mouth daily. 04/04/15  Yes [provider]  aspirin EC 81 MG tablet Take 81 mg by mouth daily. Swallow whole.   Yes [provider]  atorvastatin (LIPITOR) 10 MG tablet Take 10 mg by mouth daily. 04/04/15  Yes [provider]  clonazePAM (KLONOPIN) 1 MG tablet Take 1 tablet (1 mg total) by mouth at bedtime. Patient taking differently: Take 1 mg by mouth at bedtime as needed for anxiety. 10/30/20 09/30/22 Yes Pucilowski, Olgierd A, MD  Empagliflozin-metFORMIN HCl (SYNJARDY) 12.03-999 MG TABS Take 1 tablet by mouth daily.   Yes [provider]  fluticasone (FLONASE) 50 MCG/ACT nasal spray Place 1 spray into both nostrils 2 (two) times daily as needed for allergies. 12/24/20  Yes [provider]  ibuprofen (ADVIL) 200 MG tablet Take 400 mg by mouth every 6 (six) hours as needed for headache (pain).   Yes [provider]  insulin glargine, 1 Unit Dial, (TOUJEO SOLOSTAR) 300 UNIT/ML Solostar Pen Inject 50 Units into the skin See admin instructions.  Inject 50 units subcutaneously twice daily - noon and bedtime   Yes [provider]  lisinopril-hydrochlorothiazide (ZESTORETIC) 20-12.5 MG tablet Take 1 tablet by mouth daily. Patient taking differently: Take 1 tablet by mouth in the morning and at bedtime. 10/14/20  Yes Mercy Riding, MD  nicotine (NICODERM CQ - DOSED IN MG/24 HOURS) 21 mg/24hr patch Place 1 patch (21 mg total) onto the skin daily. 10/15/20  Yes Mercy Riding, MD  omeprazole (PRILOSEC) 20 MG capsule Take 20 mg by mouth daily as needed (heartburn).  02/08/15  Yes [provider]  sertraline (ZOLOFT) 100 MG tablet Take 1 tablet (100 mg total) by mouth 2 (two) times daily. 02/05/21 09/30/22 Yes Pucilowski, Olgierd A, MD  cadexomer iodine (IODOSORB) 0.9 % gel Apply 1 application topically 1 day or 1 dose. Cleanse wound with wound wash solution.  Apply small dab of iodorsorb and cover with a dry dressing daily Patient not taking: No sig reported 12/24/20   Bronson Ing, DPM  collagenase (SANTYL) ointment Apply 1 application topically daily. Patient not taking: Reported on 09/30/2021 12/10/20   Evelina Bucy, DPM  Lancets [facility] DELICA PLUS [id number]) MISC  USE TO TEST BLOOD SUGAR THREE TIMES DAILY 01/09/20   [provider]  ONETOUCH VERIO test strip USE TO TEST BLOOD SUGAR THREE TIMES DAILY 01/09/20   [provider]  oxyCODONE-acetaminophen (PERCOCET) 10-325 MG tablet Take 1 tablet by mouth every 4 (four) hours as needed for pain. Patient not taking: Reported on 09/30/2021 09/16/21   Evelina Bucy, DPM  pregabalin (LYRICA) 75 MG capsule Take 1 capsule (75 mg total) by mouth 2 (two) times daily. Patient not taking: Reported on 09/30/2021 04/04/21   Evelina Bucy, DPM  silver sulfADIAZINE (SILVADENE) 1 % cream Apply pea-sized amount to wound daily. Patient not taking: Reported on 09/30/2021 07/15/21   Evelina Bucy, DPM    Allergies    Patient has no known allergies.  Review of Systems   Review of Systems  All other systems reviewed and are negative.  Physical Exam Updated Vital Signs BP 115/71   Pulse 88   Temp 98.1 F (36.7 C) (Oral)   Resp (!) 21   SpO2 97%   Physical Exam Vitals and nursing note reviewed.  60 year old male, resting comfortably and in no acute distress. Vital signs are normal. Oxygen saturation is 97%, which is normal. Head is normocephalic and atraumatic. PERRLA, EOMI. Oropharynx is clear. Neck is nontender and supple without adenopathy or JVD. Back is nontender and there is no CVA tenderness. Lungs are clear without rales, wheezes, or rhonchi. Chest is nontender. Heart has regular rate and rhythm without murmur. Abdomen is soft, flat, nontender without masses or hepatosplenomegaly and peristalsis is hypoactive. Extremities: Left foot is status post amputation at the distal metatarsal level.  The foot has significant erythema, warmth, swelling.  On the plantar surface, there is an ulcer with some purulent drainage. Skin is warm and dry without other moves all extremities equally rash. Neurologic: Mental status is normal, cranial nerves are intact, there are no motor or sensory  deficits.    ED Results / Procedures / Treatments   Labs (all labs ordered are listed, but only abnormal results are displayed) Labs Reviewed  CBC WITH DIFFERENTIAL/PLATELET - Abnormal; Notable for the following components:      Result Value   WBC 13.8 (*)    Neutro Abs 10.9 (*)    Monocytes Absolute 1.3 (*)  Abs Immature Granulocytes 0.08 (*)    All other components within normal limits  COMPREHENSIVE METABOLIC PANEL - Abnormal; Notable for the following components:   Sodium 128 (*)    Potassium 3.4 (*)    Chloride 91 (*)    CO2 21 (*)    Glucose, Bld 149 (*)    BUN 28 (*)    Creatinine, Ser 1.41 (*)    Calcium 8.8 (*)    GFR, Estimated 57 (*)    Anion gap 16 (*)    All other components within normal limits  SEDIMENTATION RATE - Abnormal; Notable for the following components:   Sed Rate 50 (*)    All other components within normal limits  CULTURE, BLOOD (ROUTINE X 2)  RESP PANEL BY RT-PCR (FLU A&B, COVID) [id number]  CULTURE, BLOOD (ROUTINE X 2)  AEROBIC/ANAEROBIC CULTURE W GRAM STAIN (SURGICAL/DEEP WOUND)  LACTIC ACID, PLASMA  URINALYSIS, ROUTINE W REFLEX MICROSCOPIC  C-REACTIVE PROTEIN    EKG EKG Interpretation  Date/Time:  Tuesday September 30 2021 01:56:33 EST Ventricular Rate:  91 PR Interval:  198 QRS Duration: 101 QT Interval:  405 QTC Calculation: 499 R Axis:   91 Text Interpretation: Sinus rhythm Right axis deviation Borderline prolonged QT interval When compared with ECG of EARLIER SAME DATE No significant change was found Confirmed by Delora Fuel ([id number]) on 09/30/2021 2:09:27 AM  Radiology DG Foot Complete Left  Result Date: 09/30/2021 CLINICAL DATA:  Diabetic foot ulcer, evaluate for possible osteomyelitis. EXAM: LEFT FOOT - COMPLETE 3+ VIEW COMPARISON:  01/14/2021 FINDINGS: There are changes consistent with prior transmetatarsal amputation involving the second through fifth metatarsals. Near complete amputation of the first metatarsal is seen. Tarsal  degenerative changes are noted. Soft tissue ulcer is noted along the inferior aspect of the foot and along the soft tissues near the amputation site. No definitive osteomyelitis is noted. No bony erosive changes are seen. IMPRESSION: Soft tissue defects consistent with the given clinical history. Postsurgical changes without evidence of osteomyelitis. Electronically Signed   By: Inez Catalina M.D.   On: 09/30/2021 02:52    Procedures Procedures   Medications Ordered in ED Medications  ondansetron (ZOFRAN) injection 4 mg (4 mg Intravenous Given 09/30/21 0247)  lactated ringers bolus 1,000 mL (0 mLs Intravenous Stopped 09/30/21 0442)  vancomycin (VANCOCIN) IVPB 1000 mg/200 mL premix (0 mg Intravenous Stopped 09/30/21 0442)    ED Course  I have reviewed the triage vital signs and the nursing notes.  Pertinent labs & imaging results that were available during my care of the patient were reviewed by me and considered in my medical decision making (see chart for details).   MDM Rules/Calculators/A&P                         Nausea and vomiting of uncertain cause.  Diabetic ulcer of the left foot showing clear signs of infection worrisome for development of osteomyelitis.  Old records are reviewed, and he has been followed by podiatry.  Last note from podiatry on 11/1 stated that there was no warmth, erythema or signs of acute infection.  There has been clearly been progression since then.  Labs show elevated WBC, normal lactic acid level, renal insufficiency which is new compared with 3/22.  Renal insufficiency may be related to excessive vomiting.  He also has mild hypokalemia and hyponatremia which are probably also related to vomiting.  He is given IV fluids and ondansetron.  Foot x-ray is ordered, but  will probably need MRI.  He is started on vancomycin to treat presumed osteomyelitis.  Foot x-ray shows no definite evidence of osteomyelitis.  Sedimentation rate is elevated at 57, CRP pending.  She  will need MRI scan to definitively diagnose whether she has osteomyelitis or not.  Case is discussed with Dr. Loney Loh of [facility] hospitalist, who agrees to admit the patient.  Final Clinical Impression(s) / ED Diagnoses Final diagnoses:  Cellulitis of left foot  Acute kidney injury (nontraumatic) (HCC)  Nausea and vomiting, unspecified vomiting type    Rx / DC Orders ED Discharge Orders     None        Dione Booze, MD 09/30/21 [phone]

## 2021-09-30 NOTE — ED Notes (Signed)
Verified with Hospitalist, Dr. Ophelia Charter, holding pt's Semglee insulin and sliding scale Novolog insulin at this time d/t pt's poor PO intake and current POC BG of 117.

## 2021-09-30 NOTE — Progress Notes (Signed)
ABI's have been completed. Preliminary results can be found in CV Proc through chart review.   09/30/21 11:04 AM Olen Cordial RVT

## 2021-09-30 NOTE — ED Notes (Signed)
Hospitalist at the bedside 

## 2021-09-30 NOTE — ED Notes (Signed)
Report sent to floor nurse.  

## 2021-09-30 NOTE — ED Notes (Signed)
Pt to MRI at this time.

## 2021-09-30 NOTE — H&P (Signed)
History and Physical    Charles Thompson YKZ:[id number] DOB: February 03, 1961 DOA: 09/30/2021  PCP: Haywood Pao, MD Consultants:  [facility] [place] - podiatry; [place] - psychiatry Patient coming from:  Home - lives alone; NOK: Charles Thompson, [phone]  Chief Complaint: Foot Ulcer  HPI: Charles Thompson is a 60 y.o. male with medical history significant of DM; HTN; HLD: and depression presenting with a diabetic foot ulcer. He quit smoking 8-9 days ago and was thinking he was having issues with that.  He was vomiting with eating, got so weak he couldn't take it anymore.  He had an appointment with his foot doctor this AM but couldn't wait.  He had antibiotics at home but couldn't find them.  The foot started turning red a few days ago.  He feels better after the abx and fluid and he is so sleepy. He has had prior amputations - L TMA and R big toe in maybe 2016 or 2017.  +chills, sweats, unsure if he has had fever.    ED Course: [place], per Dr. Marlowe Sax:   60 year old diabetic with left foot ulcer and foul drainage.  Appears to have cellulitis, no osteo on x-ray.  Lactate normal, WBC slightly elevated.  Vomiting at home and has developed AKI with baseline creatinine 0.8 and now up to 1.4.  Also sodium is low at 128.  LFTs normal.  Patient was given IV fluids and ceftriaxone.   Review of Systems: As per HPI; otherwise review of systems reviewed and negative.   Ambulatory Status:  Ambulates without assistance  COVID Vaccine Status:  Complete  Past Medical History:  Diagnosis Date   Anxiety    Arthritis    neck   CKD (chronic kidney disease), stage II    Diabetic peripheral neuropathy ([place])    Diabetic ulcer of left foot (HCC)    First degree heart block    GERD (gastroesophageal reflux disease)    History of cellulitis 09/2020   left lower leg   History of osteomyelitis    bilateral feet   Hyperlipidemia    Hypertension    followed by pcp  (12-14--2021 per pt never had a stress  test)   MDD (major depressive disorder)    Type 2 diabetes mellitus with insulin therapy ([place])    followed by pcp   (01-17-2021  pt stated checks blood sugar 6 times weekly in am,  fasting sugar--- 120)    Past Surgical History:  Procedure Laterality Date   ALLOGRAFT APPLICATION Right 07/19/9029   Procedure: APPLICATION OF INTEGRA;  Surgeon: Evelina Bucy, DPM;  Location: [place];  Service: Podiatry;  Laterality: Right;   AMPUTATION Left 06/22/2018   Procedure: LEFT FOOT FIRST RAY AMPUTATION;  Surgeon: Newt Minion, MD;  Location: [place];  Service: Orthopedics;  Laterality: Left;   AMPUTATION Left 09/09/2018   Procedure: Left Transmetatarsal Amputation;  Surgeon: Newt Minion, MD;  Location: [place];  Service: Orthopedics;  Laterality: Left;   BONE BIOPSY Left 10/20/2020   Procedure: BONE BIOPSY;  Surgeon: Evelina Bucy, DPM;  Location: [place];  Service: Podiatry;  Laterality: Left;   EYE SURGERY     I & D EXTREMITY Left 10/13/2020   Procedure: IRRIGATION AND DEBRIDEMENT FOOT;  Surgeon: Edrick Kins, DPM;  Location: [place];  Service: Podiatry;  Laterality: Left;   I & D EXTREMITY Left 10/10/2020   Procedure: IRRIGATION AND DEBRIDEMENT OF FOOT;  Surgeon: Edrick Kins, DPM;  Location: [place];  Service: Podiatry;  Laterality: Left;   I & D EXTREMITY Left 10/20/2020   Procedure: IRRIGATION AND DEBRIDEMENT OF COMPLEX WOUND HEEL;  Surgeon: Evelina Bucy, DPM;  Location: [place];  Service: Podiatry;  Laterality: Left;   IRRIGATION AND DEBRIDEMENT FOOT Left 01/22/2021   Procedure: Left foot debridement of wounds, calcaneal bone biopsy, skin graft substitute;  Surgeon: Evelina Bucy, DPM;  Location: [place];  Service: Podiatry;  Laterality: Left;   TENDON LENGTHENING Right 03/19/2020   Procedure: TENDOACHILLES LENGTHENING;  Surgeon: Evelina Bucy, DPM;  Location: [place];  Service: Podiatry;  Laterality: Right;   TOE AMPUTATION Right  12-11-2015  @[facility]    great toe   WOUND DEBRIDEMENT Right 03/19/2020   Procedure: DEBRIDEMENT WOUND;  Surgeon: Evelina Bucy, DPM;  Location: [place];  Service: Podiatry;  Laterality: Right;   WOUND DEBRIDEMENT Left 10/31/2020   Procedure: DEBRIDEMENT WOUNDS LEFT FOOT WITH  CLOSURE OF MIDFOOT WOUND;  Surgeon: Evelina Bucy, DPM;  Location: [place];  Service: Podiatry;  Laterality: Left;   WOUND DEBRIDEMENT Left 12/18/2020   Procedure: DEBRIDEMENT WOUND/COMPLEX WOUND CLOSURE OF HEEL;  Surgeon: Evelina Bucy, DPM;  Location: [facility];  Service: Podiatry;  Laterality: Left;    Social History   Socioeconomic History   Marital status: Single    Spouse name: Not on file   Number of children: Not on file   Years of education: Not on file   Highest education level: Not on file  Occupational History   Occupation: disabled  Tobacco Use   Smoking status: Every Day    Packs/day: 1.00    Types: Cigarettes   Smokeless tobacco: Never  Vaping Use   Vaping Use: Never used  Substance and Sexual Activity   Alcohol use: Yes    Alcohol/week: 0.0 standard drinks    Comment: occ   Drug use: Not Currently    Types: Marijuana    Comment: "not lately"   Sexual activity: Not Currently  Other Topics Concern   Not on file  Social History Narrative   Not on file   Social Determinants of Health   Financial Resource Strain: Not on file  Food Insecurity: Not on file  Transportation Needs: Not on file  Physical Activity: Not on file  Stress: Not on file  Social Connections: Not on file  Intimate Partner Violence: Not on file    No Known Allergies  Family History  Problem Relation Age of Onset   Diabetes Other     Prior to Admission medications   Medication Sig Start Date End Date Taking? Authorizing Provider  amLODipine (NORVASC) 10 MG tablet Take 10 mg by mouth daily. 04/04/15  Yes [provider]  aspirin EC 81 MG tablet Take 81 mg by mouth  daily. Swallow whole.   Yes [provider]  atorvastatin (LIPITOR) 10 MG tablet Take 10 mg by mouth daily. 04/04/15  Yes [provider]  clonazePAM (KLONOPIN) 1 MG tablet Take 1 tablet (1 mg total) by mouth at bedtime. Patient taking differently: Take 1 mg by mouth at bedtime as needed for anxiety. 10/30/20 09/30/22 Yes Pucilowski, Olgierd A, MD  Empagliflozin-metFORMIN HCl (SYNJARDY) 12.03-999 MG TABS Take 1 tablet by mouth daily.   Yes [provider]  fluticasone (FLONASE) 50 MCG/ACT nasal spray Place 1 spray into both nostrils 2 (two) times daily as needed for allergies. 12/24/20  Yes [provider]  ibuprofen (ADVIL) 200 MG  tablet Take 400 mg by mouth every 6 (six) hours as needed for headache (pain).   Yes [provider]  insulin glargine, 1 Unit Dial, (TOUJEO SOLOSTAR) 300 UNIT/ML Solostar Pen Inject 50 Units into the skin See admin instructions. Inject 50 units subcutaneously twice daily - noon and bedtime   Yes [provider]  lisinopril-hydrochlorothiazide (ZESTORETIC) 20-12.5 MG tablet Take 1 tablet by mouth daily. Patient taking differently: Take 1 tablet by mouth in the morning and at bedtime. 10/14/20  Yes Mercy Riding, MD  nicotine (NICODERM CQ - DOSED IN MG/24 HOURS) 21 mg/24hr patch Place 1 patch (21 mg total) onto the skin daily. 10/15/20  Yes Mercy Riding, MD  omeprazole (PRILOSEC) 20 MG capsule Take 20 mg by mouth daily as needed (heartburn).  02/08/15  Yes [provider]  sertraline (ZOLOFT) 100 MG tablet Take 1 tablet (100 mg total) by mouth 2 (two) times daily. 02/05/21 09/30/22 Yes Pucilowski, Olgierd A, MD  cadexomer iodine (IODOSORB) 0.9 % gel Apply 1 application topically 1 day or 1 dose. Cleanse wound with wound wash solution.  Apply small dab of iodorsorb and cover with a dry dressing daily Patient not taking: No sig reported 12/24/20   Bronson Ing, DPM  collagenase (SANTYL) ointment Apply 1 application  topically daily. Patient not taking: Reported on 09/30/2021 12/10/20   Evelina Bucy, DPM  Lancets [facility] DELICA PLUS [id number]) MISC USE TO TEST BLOOD SUGAR THREE TIMES DAILY 01/09/20   [provider]  ONETOUCH VERIO test strip USE TO TEST BLOOD SUGAR THREE TIMES DAILY 01/09/20   [provider]  oxyCODONE-acetaminophen (PERCOCET) 10-325 MG tablet Take 1 tablet by mouth every 4 (four) hours as needed for pain. Patient not taking: Reported on 09/30/2021 09/16/21   Evelina Bucy, DPM  pregabalin (LYRICA) 75 MG capsule Take 1 capsule (75 mg total) by mouth 2 (two) times daily. Patient not taking: Reported on 09/30/2021 04/04/21   Evelina Bucy, DPM  silver sulfADIAZINE (SILVADENE) 1 % cream Apply pea-sized amount to wound daily. Patient not taking: Reported on 09/30/2021 07/15/21   Evelina Bucy, DPM    Physical Exam: Vitals:   09/30/21 1500 09/30/21 1530 09/30/21 1558 09/30/21 1833  BP: (!) 130/52 115/66 134/68 (!) 147/78  Pulse:  88 89 98  Resp: (!) 22 (!) 23 19   Temp:   98.9 F (37.2 C) 98.6 F (37 C)  TempSrc:   Oral Oral  SpO2: 96% 96% 96% 99%  Weight:      Height:         General:  Appears calm and comfortable and is in NAD Eyes:  PERRL, EOMI, normal lids, iris ENT:  grossly normal hearing, lips & tongue, mmm Neck:  no LAD, masses or thyromegaly Cardiovascular:  RRR, no m/r/g.  Respiratory:   CTA bilaterally with no wheezes/rales/rhonchi.  Normal to mildly increased respiratory effort. Abdomen:  soft, NT, ND Skin:  L foot s/p TMA and with diffuse edema and erythema; also with ulcerations along the entire plantar surface from metatarsal region to heel on the L.  Also with R 1st MTP plantar ulcer that currently appears to be undraining; he has various other calluses on that foot, as well.           Musculoskeletal:  as above, s/p L TMA and R great toe amputation.  His R 2nd toe is prominent and has erythema where it appears to be rubbing  against his shoe. Psychiatric:  grossly normal mood and affect (periodically cantankerous), speech fluent and appropriate, AOx3 Neurologic:  CN 2-12 grossly intact, moves all extremities in coordinated fashion    Radiological Exams on Admission: Independently reviewed - see discussion in A/P where applicable  MR FOOT LEFT W WO CONTRAST  Result Date: 09/30/2021 CLINICAL DATA:  Foot swelling, diabetic, osteomyelitis suspected, no prior imaging EXAM: MRI OF THE LEFT FOREFOOT WITHOUT AND WITH CONTRAST TECHNIQUE: Multiplanar, multisequence MR imaging of the left forefoot was performed both before and after administration of intravenous contrast. CONTRAST:  48m GADAVIST GADOBUTROL 1 MMOL/ML IV SOLN COMPARISON:  Same day radiograph FINDINGS: Bones/Joint/Cartilage Prior transmetatarsal amputation. There is intense bony edema and enhancement with confluent low T1 signal within the medial cuneiform. Bony edema and enhancement within the base of the residual second metatarsal and medial aspect of the middle cuneiform. Ligaments The Lisfranc ligament is present and intact. Muscles and Tendons Diffuse intramuscular edema and atrophy in the foot as is commonly seen in diabetics. Fraying at the distal attachment of the peroneal longus tendon. Soft tissues There is a plantar medial soft tissue ulcer which extends to the surface of the medial cuneiform, with internal fluid signal. This appears to connect with the skin surface and is likely draining. No other nondraining rim enhancing abscess in the foot. IMPRESSION: Osteomyelitis of the medial cuneiform. Probable early osteomyelitis of the base of the second metatarsal and medial aspect of the middle cuneiform. Adjacent plantar medial soft tissue ulcer, with continuous extension to the plantar surface of the medial cuneiform and is likely draining, correlate with exam. No other nondraining rim enhancing abscess identified. Diffuse soft tissue swelling and enhancement  consistent with cellulitis. Electronically Signed   By: JMaurine SimmeringM.D.   On: 09/30/2021 12:02   DG Foot Complete Left  Result Date: 09/30/2021 CLINICAL DATA:  Diabetic foot ulcer, evaluate for possible osteomyelitis. EXAM: LEFT FOOT - COMPLETE 3+ VIEW COMPARISON:  01/14/2021 FINDINGS: There are changes consistent with prior transmetatarsal amputation involving the second through fifth metatarsals. Near complete amputation of the first metatarsal is seen. Tarsal degenerative changes are noted. Soft tissue ulcer is noted along the inferior aspect of the foot and along the soft tissues near the amputation site. No definitive osteomyelitis is noted. No bony erosive changes are seen. IMPRESSION: Soft tissue defects consistent with the given clinical history. Postsurgical changes without evidence of osteomyelitis. Electronically Signed   By: MInez CatalinaM.D.   On: 09/30/2021 02:52   VAS [place]ABI WITH/WO TBI  Result Date: 09/30/2021  LOWER EXTREMITY DOPPLER STUDY Patient Name:  KMASAYUKI SAKAI Date of Exam:   09/30/2021 Medical Rec #: [id number]      Accession #:    [id number] of Birth: 111-19-1962     Patient Gender: M Patient Age:   89years [facility]      [facility] [place] WITH/WO TBI Referring Phys: JAnderson MaltaYATES --------------------------------------------------------------------------------  Indications: Ulceration. High Risk Factors: Diabetes. Other Factors: 12/11/2015 - AMPUTATION RIGHT FOOT GREAT TOE                 06/22/2018 - LEFT FOOT FIRST RAY AMPUTATION                 09/09/2018 - Left Transmetatarsal Amputation.  Comparison Study: 06/20/2018 - Right: Resting right ankle-brachial index is within                   normal range. No  evidence of significant right lower extremity arterial                   disease.                    Unable to obtain TBI due to great toe amputation.                   Left: Resting left ankle-brachial index is within  normal                   range. No                   evidence of significant left lower extremity arterial disease.                   The left                   toe-brachial index is normal. Performing Technologist: Carlos Levering RVT  Examination Guidelines: A complete evaluation includes at minimum, Doppler waveform signals and systolic blood pressure reading at the level of bilateral brachial, anterior tibial, and posterior tibial arteries, when vessel segments are accessible. Bilateral testing is considered an integral part of a complete examination. Photoelectric Plethysmograph (PPG) waveforms and toe systolic pressure readings are included as required and additional duplex testing as needed. Limited examinations for reoccurring indications may be performed as noted.  ABI Findings: +--------+------------------+-----+---------+--------+ Right   Rt Pressure (mmHg)IndexWaveform Comment  +--------+------------------+-----+---------+--------+ [id number]                    triphasic         +--------+------------------+-----+---------+--------+ PTA     154               1.18 triphasic         +--------+------------------+-----+---------+--------+ DP      148               1.14 triphasic         +--------+------------------+-----+---------+--------+ +--------+------------------+-----+---------+-------+ Left    Lt Pressure (mmHg)IndexWaveform Comment +--------+------------------+-----+---------+-------+ [id number]                    triphasic        +--------+------------------+-----+---------+-------+ PTA     150               1.15 biphasic         +--------+------------------+-----+---------+-------+ DP      141               1.08 biphasic         +--------+------------------+-----+---------+-------+ +-------+-----------+-----------+------------+------------+ ABI/TBIToday's ABIToday's TBIPrevious ABIPrevious TBI  +-------+-----------+-----------+------------+------------+ Right  1.18                  1.15                     +-------+-----------+-----------+------------+------------+ Left   1.15                  1.07                     +-------+-----------+-----------+------------+------------+  Summary: Right: Resting right ankle-brachial index is within normal range. No evidence of significant right lower extremity arterial disease. Unable to obtain TBI due to great toe amputation. Left: Resting left ankle-brachial index is within normal range. No evidence of significant left lower extremity arterial disease. Unable to obtain TBI  due to great toe amputation.  *See table(s) above for measurements and observations.  Electronically signed by Deitra Mayo MD on 09/30/2021 at 1:54:22 PM.    Final     EKG: Independently reviewed.  Sinus tachycardia with rate 113; nonspecific ST changes with NSCSLT   Labs on Admission: I have personally reviewed the available labs and imaging studies at the time of the admission.  Pertinent labs:   Na++ 128 Glucose 149 BUN 28/Creatinine 1.41/GFR 57; 18/0.81 in 01/2021 CRP 19.5 Lactate 1.5 WBC 13.8 ESR 50 COVID/flu negative UA: >500 glucose, small Hgb, 5 ketones   Assessment/Plan Principal Problem:   Diabetic foot infection (HCC) Active Problems:   Diabetic ulcer of left foot associated with type 2 diabetes mellitus (HCC)   History of transmetatarsal amputation of left foot (HCC)   Major depressive disorder, recurrent episode, in remission with seasonal pattern (HCC)   Tobacco abuse   Type 2 diabetes mellitus with diabetic neuropathy, with long-term current use of insulin (HCC)   Cellulitis   AKI (acute kidney injury) ([place])   Mood disorder ([place])   Diabetic foot infection -Patient is s/p L TMA and has a chronic L midfoot ulcer that is followed by podiatry -Foot ulcers are present along the plantar L foot and now the patient has developed  surrounding cellulitis -Xray negative for deep infection but MRI shows osteo -Negative lactate, no current concerns for sepsis -Will treat with IV antibiotics (Rocephin/Flagyl/Vancomycin) -I messaged to ask Dr. Sharol Given to see the patient tomorrow; he asks that the patient be admitted at [facility] of [facility] and so order has been changed -Based on refractory ulcer infection with osteitis, it seems that the patient may be heading for additional amputation -Patient has h/o PVD and so I have ordered B ABIs in case revascularization may be indicated -LE wound order set utilized including labs (CRP, ESR, A1c, prealbumin, HIV, and blood cultures) and consults (diabetes coordinator; peripheral vascular navigator; TOC team; wound care; and nutrition)  -He does have R foot ulcer/calluses too and will need to very carefully manage these to prevent B amputations  AKI -Likely related to acute infection -Hold Zestoretic for now  DM -Will check A1c -hold Synjardy -Toujeo ordered but held due to low glucose today -Cover with moderate-scale SSI   HTN -Continue Norvasc -Hold Zestoretic for now  HLD -Continue Lipitor  Mood d/o -Continue Zoloft, Klonopin -He has been periodically cantankerous today and is threatening to leave AMA  Tobacco dependence -He quit 8-9 days ago; praise provided! -Ongoing cessation efforts are strongly encouraged. -Patch ordered at patient request.      Note: This patient has been tested and is negative for the novel coronavirus COVID-19. The patient has been fully vaccinated against COVID-19.   Level of care: Med-Surg DVT prophylaxis:  Lovenox  Code Status:  DNR- confirmed with patient Family Communication: None present; I spoke with the patient's sister by telephone at the time of admission. Disposition Plan:  The patient is from: home  Anticipated d/c is to: determined  Anticipated d/c date will depend on clinical response to treatment, likely several days (if he  is willing to stay)  Patient is currently: acutely ill Consults called: Orthopedics; diabetes coordinator; peripheral vascular navigator; TOC team; wound care; and nutrition  Admission status:  Admit - It is my clinical opinion that admission to INPATIENT is reasonable and necessary because of the expectation that this patient will require hospital care that crosses at least 2 midnights to treat this condition  based on the medical complexity of the problems presented.  Given the aforementioned information, the predictability of an adverse outcome is felt to be significant.    Karmen Bongo MD [facility]   How to contact the [facility] Attending or Consulting provider [place] or covering provider during after hours [place], for this patient?  Check the care team in [facility] and look for a) attending/consulting [facility] provider listed and b) the [facility] team listed Log into [email or web address] and use [place]'s universal password to access. If you do not have the password, please contact the hospital operator. Locate the [facility] provider you are looking for under [facility] and page to a number that you can be directly reached. If you still have difficulty reaching the provider, please page the [facility] (Director on Call) for the Hospitalists listed on amion for assistance.   09/30/2021, 6:57 PM

## 2021-09-30 NOTE — Progress Notes (Signed)
    OVERNIGHT PROGRESS REPORT  Notified by RN for patient concern over Klonopin. Patient states that he does not take Klonopin and that he does actually take Ativan.  Ativan ordered for tonight as verified by patient.  Chinita Greenland MSNA MSN ACNPC-AG Acute Care Nurse Practitioner [facility]

## 2021-09-30 NOTE — ED Notes (Signed)
Swabbed patients foot with an anerobic swab

## 2021-09-30 NOTE — ED Notes (Signed)
Pt care taken, is resting, no complaints at this time. 

## 2021-09-30 NOTE — ED Triage Notes (Signed)
Pt states that he has been having chills and not feeling well x 1 week. Pt has a wound to his left bottom foot that is open. Left foot is red.

## 2021-10-01 ENCOUNTER — Telehealth: Payer: Self-pay | Admitting: Podiatry

## 2021-10-01 DIAGNOSIS — N179 Acute kidney failure, unspecified: Secondary | ICD-10-CM

## 2021-10-01 DIAGNOSIS — L97529 Non-pressure chronic ulcer of other part of left foot with unspecified severity: Secondary | ICD-10-CM

## 2021-10-01 DIAGNOSIS — M86672 Other chronic osteomyelitis, left ankle and foot: Secondary | ICD-10-CM

## 2021-10-01 DIAGNOSIS — L03116 Cellulitis of left lower limb: Secondary | ICD-10-CM

## 2021-10-01 LAB — BASIC METABOLIC PANEL
Anion gap: 11 (ref 5–15)
BUN: 18 mg/dL (ref 8–23)
CO2: 23 mmol/L (ref 22–32)
Calcium: 8.5 mg/dL — ABNORMAL LOW (ref 8.9–10.3)
Chloride: 99 mmol/L (ref 98–111)
Creatinine, Ser: 0.78 mg/dL (ref 0.61–1.24)
GFR, Estimated: >60 mL/min (ref >60)
Glucose, Bld: 104 mg/dL — ABNORMAL HIGH (ref 70–99)
Potassium: 2.9 mmol/L — ABNORMAL LOW (ref 3.5–5.1)
Sodium: 133 mmol/L — ABNORMAL LOW (ref 135–145)

## 2021-10-01 LAB — GLUCOSE, CAPILLARY
Glucose-Capillary: 94 mg/dL (ref 70–99)
Glucose-Capillary: 98 mg/dL (ref 70–99)
Glucose-Capillary: 80 mg/dL (ref 70–99)
Glucose-Capillary: 124 mg/dL — ABNORMAL HIGH (ref 70–99)
Glucose-Capillary: 162 mg/dL — ABNORMAL HIGH (ref 70–99)
Glucose-Capillary: 189 mg/dL — ABNORMAL HIGH (ref 70–99)

## 2021-10-01 LAB — CBC
HCT: 36.3 % — ABNORMAL LOW (ref 39.0–52.0)
Hemoglobin: 12.5 g/dL — ABNORMAL LOW (ref 13.0–17.0)
MCH: 28 pg (ref 26.0–34.0)
MCHC: 34.4 g/dL (ref 30.0–36.0)
MCV: 81.4 fL (ref 80.0–100.0)
Platelets: 261 10*3/uL (ref 150–400)
RBC: 4.46 MIL/uL (ref 4.22–5.81)
RDW: 13.1 % (ref 11.5–15.5)
WBC: 9.1 10*3/uL (ref 4.0–10.5)
nRBC: 0 % (ref 0.0–0.2)

## 2021-10-01 MED ORDER — ENSURE ENLIVE PO LIQD
237.0000 mL | Freq: Two times a day (BID) | ORAL | Status: DC
Start: 2021-10-02 — End: 2021-10-03

## 2021-10-01 MED ORDER — GLUCERNA SHAKE PO LIQD
237.0000 mL | Freq: Three times a day (TID) | ORAL | Status: DC
  Filled 2021-10-01 (×7): qty 237

## 2021-10-01 MED ORDER — POTASSIUM CHLORIDE CRYS ER 20 MEQ PO TBCR
40.0000 meq | EXTENDED_RELEASE_TABLET | Freq: Two times a day (BID) | ORAL | Status: AC
  Administered 2021-10-01 (×2): 40 meq via ORAL
  Filled 2021-10-01 (×2): qty 2

## 2021-10-01 MED ORDER — ADULT MULTIVITAMIN W/MINERALS CH
1.0000 | ORAL_TABLET | Freq: Every day | ORAL | Status: DC
  Administered 2021-10-01 – 2021-10-03 (×3): 1 via ORAL
  Filled 2021-10-01 (×3): qty 1

## 2021-10-01 MED ORDER — CHLORHEXIDINE GLUCONATE CLOTH 2 % EX PADS
6.0000 | MEDICATED_PAD | Freq: Once | CUTANEOUS | Status: AC
  Administered 2021-10-02: 17:00:00 6 via TOPICAL

## 2021-10-01 MED ORDER — JUVEN PO PACK
1.0000 | PACK | Freq: Two times a day (BID) | ORAL | Status: DC
  Filled 2021-10-01 (×4): qty 1

## 2021-10-01 MED ORDER — CHLORHEXIDINE GLUCONATE CLOTH 2 % EX PADS
6.0000 | MEDICATED_PAD | Freq: Once | CUTANEOUS | Status: AC
  Administered 2021-10-02: 01:00:00 6 via TOPICAL

## 2021-10-01 NOTE — Progress Notes (Signed)
Initial Nutrition Assessment  DOCUMENTATION CODES:  Not applicable  INTERVENTION:  Add Glucerna Shake po TID, each supplement provides 220 kcal and 10 grams of protein.  Add Ensure Max po daily, each supplement provides 150 kcal and 30 grams of protein.   Add 1 packet Juven BID, each packet provides 95 calories, 2.5 grams of protein (collagen), and 9.8 grams of carbohydrate (3 grams sugar); also contains 7 grams of L-arginine and L-glutamine, 300 mg vitamin C, 15 mg vitamin E, 1.2 mcg vitamin B-12, 9.5 mg zinc, 200 mg calcium, and 1.5 g  Calcium Beta-hydroxy-Beta-methylbutyrate to support wound healing.  Add MVI with minerals daily.  Encourage PO and supplement intake.  NUTRITION DIAGNOSIS:  Increased nutrient needs related to wound healing as evidenced by estimated needs.  GOAL:  Patient will meet greater than or equal to 90% of their needs  MONITOR:  PO intake, Supplement acceptance, Labs, Weight trends, Skin, I & O's  REASON FOR ASSESSMENT:  Consult Wound healing  ASSESSMENT:  60 yo male with a PMH of T2DM, HTN, HLD, and depression presenting with a diabetic foot ulcer.  RD working remotely.  Per Epic, pt ate 75% of breakfast this morning.  Per Epic, pt's weight has been stable for the past 11 months.  Recommend Glucerna shakes TID, Ensure Max daily, Juven BID, and MVI with minerals daily to aid in wound healing.  Medications: reviewed; colace BID, SSI, Semglee, Klor-Con BID once, NaCl @ 75 ml/hr, Vicodin PO PRN (given once today)  Labs: reviewed; Na 133 (L), K 2.9 (L - repletion in progress), CBG 80-151 HbA1c: 6.3% (09/30/2021)  NUTRITION - FOCUSED PHYSICAL EXAM: Unable to perform - defer to follow-up  Diet Order:   Diet Order             Diet heart healthy/carb modified Room service appropriate? Yes; Fluid consistency: Thin  Diet effective now                  EDUCATION NEEDS:  No education needs have been identified at this time  Skin:  Skin  Assessment: Skin Integrity Issues: Skin Integrity Issues:: Diabetic Ulcer Diabetic Ulcer: L & R feet  Last BM:  09/28/21  Height:  Ht Readings from Last 1 Encounters:  09/30/21 6' (1.829 m)   Weight:  Wt Readings from Last 1 Encounters:  09/30/21 99.8 kg   BMI:  Body mass index is 29.84 kg/m.  Estimated Nutritional Needs:  Kcal:  2100-2300 Protein:  125-140 grams Fluid:  >2.1 L  Vertell Limber, RD, LDN (she/her/hers) Clinical Inpatient Dietitian RD Pager/After-Hours/Weekend Pager # in [place]

## 2021-10-01 NOTE — Consult Note (Signed)
Reason for Consult:Dr. Marylu Lund, MD Referring Physician: Osteomyelitis, infection left foot  Charles Thompson is an 60 y.o. male.  HPI: 60 y.o. male with medical history significant of DM; HTN; HLD: and depression presenting with a diabetic foot ulcer. He quit smoking 8-9 days ago and was thinking he was having issues with that.  He was vomiting with eating, got so weak he couldn't take it anymore.  He had an appointment with Dr. March Rummage but couldn't wait.  He had antibiotics at home but couldn't find them.  The foot started turning red a few days ago.  Since being admitted and started on IV antibiotics he states that he feels much better and he feels normal.  He states that he wants to go home with oral antibiotics and follow-up with Dr. March Rummage on Friday.  Dr. Sharol Given has been consulted who recommended below-knee amputation the patient states that he is not going to proceed with this.  Past Medical History:  Diagnosis Date   Anxiety    Arthritis    neck   CKD (chronic kidney disease), stage II    Diabetic peripheral neuropathy ([place])    Diabetic ulcer of left foot (HCC)    First degree heart block    GERD (gastroesophageal reflux disease)    History of cellulitis 09/2020   left lower leg   History of osteomyelitis    bilateral feet   Hyperlipidemia    Hypertension    followed by pcp  (12-14--2021 per pt never had a stress test)   MDD (major depressive disorder)    Type 2 diabetes mellitus with insulin therapy ([place])    followed by pcp   (01-17-2021  pt stated checks blood sugar 6 times weekly in am,  fasting sugar--- 120)    Past Surgical History:  Procedure Laterality Date   ALLOGRAFT APPLICATION Right [id number]   Procedure: APPLICATION OF INTEGRA;  Surgeon: Evelina Bucy, DPM;  Location: [facility];  Service: Podiatry;  Laterality: Right;   AMPUTATION Left 06/22/2018   Procedure: LEFT FOOT FIRST RAY AMPUTATION;  Surgeon: Newt Minion, MD;  Location: [facility];  Service:  Orthopedics;  Laterality: Left;   AMPUTATION Left 09/09/2018   Procedure: Left Transmetatarsal Amputation;  Surgeon: Newt Minion, MD;  Location: [facility];  Service: Orthopedics;  Laterality: Left;   BONE BIOPSY Left 10/20/2020   Procedure: BONE BIOPSY;  Surgeon: Evelina Bucy, DPM;  Location: [facility];  Service: Podiatry;  Laterality: Left;   EYE SURGERY     I & D EXTREMITY Left 10/13/2020   Procedure: IRRIGATION AND DEBRIDEMENT FOOT;  Surgeon: Edrick Kins, DPM;  Location: [facility];  Service: Podiatry;  Laterality: Left;   I & D EXTREMITY Left 10/10/2020   Procedure: IRRIGATION AND DEBRIDEMENT OF FOOT;  Surgeon: Edrick Kins, DPM;  Location: [facility];  Service: Podiatry;  Laterality: Left;   I & D EXTREMITY Left 10/20/2020   Procedure: IRRIGATION AND DEBRIDEMENT OF COMPLEX WOUND HEEL;  Surgeon: Evelina Bucy, DPM;  Location: [facility];  Service: Podiatry;  Laterality: Left;   IRRIGATION AND DEBRIDEMENT FOOT Left 01/22/2021   Procedure: Left foot debridement of wounds, calcaneal bone biopsy, skin graft substitute;  Surgeon: Evelina Bucy, DPM;  Location: [facility];  Service: Podiatry;  Laterality: Left;   TENDON LENGTHENING Right 03/19/2020   Procedure: TENDOACHILLES LENGTHENING;  Surgeon: Evelina Bucy, DPM;  Location: [facility];  Service: Podiatry;  Laterality: Right;  TOE AMPUTATION Right 12-11-2015  @[facility]    great toe   WOUND DEBRIDEMENT Right 03/19/2020   Procedure: DEBRIDEMENT WOUND;  Surgeon: Evelina Bucy, DPM;  Location: [facility];  Service: Podiatry;  Laterality: Right;   WOUND DEBRIDEMENT Left 10/31/2020   Procedure: DEBRIDEMENT WOUNDS LEFT FOOT WITH  CLOSURE OF MIDFOOT WOUND;  Surgeon: Evelina Bucy, DPM;  Location: [facility];  Service: Podiatry;  Laterality: Left;   WOUND DEBRIDEMENT Left 12/18/2020   Procedure: DEBRIDEMENT WOUND/COMPLEX WOUND CLOSURE OF HEEL;  Surgeon: Evelina Bucy, DPM;  Location: [facility];   Service: Podiatry;  Laterality: Left;    Family History  Problem Relation Age of Onset   Diabetes Other     Social History:  reports that he has been smoking cigarettes. He has been smoking an average of 1 pack per day. He has never used smokeless tobacco. He reports current alcohol use. He reports that he does not currently use drugs after having used the following drugs: Marijuana.  Allergies: No Known Allergies  Medications: I have reviewed the patient's current medications.  Results for orders placed or performed during the hospital encounter of 09/30/21 (from the past 48 hour(s))  Lactic acid, plasma     Status: None   Collection Time: 09/30/21  1:10 AM  Result Value Ref Range   Lactic Acid, Venous 1.5 0.5 - 1.9 mmol/L    Comment: Performed at [facility], [place] [address], [place], [place] [address]  Resp Panel by RT-PCR (Flu A&B, Covid) Peripheral     Status: None   Collection Time: 09/30/21  1:27 AM   Specimen: Peripheral; Nasopharyngeal(NP) swabs in vial transport medium  Result Value Ref Range   SARS Coronavirus 2 by RT PCR NEGATIVE NEGATIVE    Comment: (NOTE) SARS-CoV-2 target nucleic acids are NOT DETECTED.  The SARS-CoV-2 RNA is generally detectable in upper respiratory specimens during the acute phase of infection. The lowest concentration of SARS-CoV-2 viral copies this assay can detect is 138 copies/mL. A negative result does not preclude SARS-Cov-2 infection and should not be used as the sole basis for treatment or other patient management decisions. A negative result may occur with  improper specimen collection/handling, submission of specimen other than nasopharyngeal swab, presence of viral mutation(s) within the areas targeted by this assay, and inadequate number of viral copies(<138 copies/mL). A negative result must be combined with clinical observations, patient history, and epidemiological information. The expected result is  Negative.  Fact Sheet for Patients:  EntrepreneurPulse.com.au  Fact Sheet for Healthcare Providers:  IncredibleEmployment.be  This test is no t yet approved or cleared by the [place] FDA and  has been authorized for detection and/or diagnosis of SARS-CoV-2 by FDA under an Emergency Use Authorization (EUA). This EUA will remain  in effect (meaning this test can be used) for the duration of the COVID-19 declaration under Section 564(b)(1) of the Act, 21 U.S.C.section 360bbb-3(b)(1), unless the authorization is terminated  or revoked sooner.       Influenza A by PCR NEGATIVE NEGATIVE   Influenza B by PCR NEGATIVE NEGATIVE    Comment: (NOTE) The Xpert Xpress SARS-CoV-2/FLU/RSV plus assay is intended as an aid in the diagnosis of influenza from Nasopharyngeal swab specimens and should not be used as a sole basis for treatment. Nasal washings and aspirates are unacceptable for Xpert Xpress SARS-CoV-2/FLU/RSV testing.  Fact Sheet for Patients: EntrepreneurPulse.com.au  Fact Sheet for Healthcare Providers: IncredibleEmployment.be  This test is not yet  approved or cleared by the [place] and has been authorized for detection and/or diagnosis of SARS-CoV-2 by FDA under an Emergency Use Authorization (EUA). This EUA will remain in effect (meaning this test can be used) for the duration of the COVID-19 declaration under Section 564(b)(1) of the Act, 21 U.S.C. section 360bbb-3(b)(1), unless the authorization is terminated or revoked.  Performed at [facility], [place] [address], [place], [place] [address]   CBC with Differential     Status: Abnormal   Collection Time: 09/30/21  1:30 AM  Result Value Ref Range   WBC 13.8 (H) 4.0 - 10.5 K/uL   RBC 5.16 4.22 - 5.81 MIL/uL   Hemoglobin 14.5 13.0 - 17.0 g/dL   HCT 41.7 39.0 - 52.0 %   MCV 80.8 80.0 - 100.0 fL   MCH 28.1 26.0 - 34.0  pg   MCHC 34.8 30.0 - 36.0 g/dL   RDW 13.1 11.5 - 15.5 %   Platelets 309 150 - 400 K/uL   nRBC 0.0 0.0 - 0.2 %   Neutrophils Relative % 79 %   Neutro Abs 10.9 (H) 1.7 - 7.7 K/uL   Lymphocytes Relative 11 %   Lymphs Abs 1.5 0.7 - 4.0 K/uL   Monocytes Relative 9 %   Monocytes Absolute 1.3 (H) 0.1 - 1.0 K/uL   Eosinophils Relative 0 %   Eosinophils Absolute 0.0 0.0 - 0.5 K/uL   Basophils Relative 0 %   Basophils Absolute 0.0 0.0 - 0.1 K/uL   Immature Granulocytes 1 %   Abs Immature Granulocytes 0.08 (H) 0.00 - 0.07 K/uL    Comment: Performed at [facility], [place] [address], [place], [place] [address]  Comprehensive metabolic panel     Status: Abnormal   Collection Time: 09/30/21  1:30 AM  Result Value Ref Range   Sodium 128 (L) 135 - 145 mmol/L   Potassium 3.4 (L) 3.5 - 5.1 mmol/L   Chloride 91 (L) 98 - 111 mmol/L   CO2 21 (L) 22 - 32 mmol/L   Glucose, Bld 149 (H) 70 - 99 mg/dL    Comment: Glucose reference range applies only to samples taken after fasting for at least 8 hours.   BUN 28 (H) 8 - 23 mg/dL   Creatinine, Ser 1.41 (H) 0.61 - 1.24 mg/dL   Calcium 8.8 (L) 8.9 - 10.3 mg/dL   Total Protein 7.8 6.5 - 8.1 g/dL   Albumin 3.6 3.5 - 5.0 g/dL   AST 17 15 - 41 U/L   ALT 23 0 - 44 U/L   Alkaline Phosphatase 95 38 - 126 U/L   Total Bilirubin 1.1 0.3 - 1.2 mg/dL   GFR, Estimated 57 (L) >60 mL/min    Comment: (NOTE) Calculated using the CKD-EPI Creatinine Equation (2021)    Anion gap 16 (H) 5 - 15    Comment: Performed at [facility], [place] [address], [place], [place] [address]  Blood culture (routine x 2)     Status: None (Preliminary result)   Collection Time: 09/30/21  1:30 AM   Specimen: BLOOD LEFT HAND  Result Value Ref Range   Specimen Description      BLOOD LEFT HAND Performed at [facility], [place] [address] [place], [place] [address]    Special Requests      BOTTLES DRAWN AEROBIC AND ANAEROBIC Blood Culture adequate  volume Performed at [place] [address] [place],  [address]  Culture      NO GROWTH 1 DAY Performed at [facility], [place] [address] [place], [place] [address]    Report Status PENDING   Blood culture (routine x 2)     Status: None (Preliminary result)   Collection Time: 09/30/21  1:30 AM   Specimen: BLOOD  Result Value Ref Range   Specimen Description      BLOOD RIGHT ANTECUBITAL Performed at [place] [address], [place], [place] [address]    Special Requests      BOTTLES DRAWN AEROBIC AND ANAEROBIC Blood Culture adequate volume Performed at [place] [address], [place], [place] [address]    Culture      NO GROWTH 1 DAY Performed at [place] [address] [place], [place] [address]    Report Status PENDING   Sedimentation rate     Status: Abnormal   Collection Time: 09/30/21  1:30 AM  Result Value Ref Range   Sed Rate 50 (H) 0 - 16 mm/hr    Comment: Performed at [facility], [place] [address] [place], [place] [address]  C-reactive protein     Status: Abnormal   Collection Time: 09/30/21  1:30 AM  Result Value Ref Range   CRP 19.5 (H) <1.0 mg/dL    Comment: Performed at [facility], [place] [address], [place], [place] [address]  Prealbumin     Status: Abnormal   Collection Time: 09/30/21  1:30 AM  Result Value Ref Range   Prealbumin 9.8 (L) 18 - 38 mg/dL    Comment: Performed at [place] [address], [place], [place] [address]  Aerobic/Anaerobic Culture w Gram Stain (surgical/deep wound)     Status: None (Preliminary result)   Collection Time: 09/30/21  2:34 AM   Specimen: Foot; Wound  Result Value Ref Range   Specimen Description      FOOT LEFT Performed at [facility], [place] [address] [place], [place] [address]    Special Requests      NONE Performed at [facility], [place] [address]  [place], [place] [address]    Gram Stain      FEW WBC PRESENT,BOTH PMN AND MONONUCLEAR FEW GRAM POSITIVE COCCI IN PAIRS    Culture      FEW PROTEUS MIRABILIS CULTURE REINCUBATED FOR BETTER GROWTH Performed at [facility], [place] [address], [place], [place] [address]    Report Status PENDING   Urinalysis, Routine w reflex microscopic Urine, Clean Catch     Status: Abnormal   Collection Time: 09/30/21  4:00 AM  Result Value Ref Range   Color, Urine YELLOW YELLOW   APPearance CLEAR CLEAR   Specific Gravity, Urine 1.012 1.005 - 1.030   pH 5.0 5.0 - 8.0   Glucose, UA >=500 (A) NEGATIVE mg/dL   Hgb urine dipstick SMALL (A) NEGATIVE   Bilirubin Urine NEGATIVE NEGATIVE   Ketones, ur 5 (A) NEGATIVE mg/dL   Protein, ur NEGATIVE NEGATIVE mg/dL   Nitrite NEGATIVE NEGATIVE   Leukocytes,Ua NEGATIVE NEGATIVE   RBC / HPF 0-5 0 - 5 RBC/hpf   WBC, UA 0-5 0 - 5 WBC/hpf   Bacteria, UA NONE SEEN NONE SEEN   Squamous Epithelial / LPF 0-5 0 - 5   Mucus PRESENT    Hyaline Casts, UA PRESENT     Comment: Performed at [facility], [place] [address]  [place], [place] [address]  Hemoglobin A1c     Status: Abnormal   Collection Time: 09/30/21  9:41 AM  Result Value Ref Range   Hgb A1c MFr Bld 6.3 (H) 4.8 - 5.6 %    Comment: (NOTE) Pre diabetes:          5.7%-6.4%  Diabetes:              >6.4%  Glycemic control for   <7.0% adults with diabetes    Mean Plasma Glucose 134.11 mg/dL    Comment: Performed at [place] [address], [place], [place] [address]  CBG monitoring, ED     Status: Abnormal   Collection Time: 09/30/21 12:34 PM  Result Value Ref Range   Glucose-Capillary 117 (H) 70 - 99 mg/dL    Comment: Glucose reference range applies only to samples taken after fasting for at least 8 hours.  CBG monitoring, ED     Status: Abnormal   Collection Time: 09/30/21  4:42 PM  Result Value Ref Range   Glucose-Capillary 143 (H) 70 - 99 mg/dL    Comment: Glucose  reference range applies only to samples taken after fasting for at least 8 hours.   Comment 1 Notify RN    Comment 2 Document in Chart   Glucose, capillary     Status: Abnormal   Collection Time: 09/30/21 10:50 PM  Result Value Ref Range   Glucose-Capillary 151 (H) 70 - 99 mg/dL    Comment: Glucose reference range applies only to samples taken after fasting for at least 8 hours.  Glucose, capillary     Status: None   Collection Time: 10/01/21  2:39 AM  Result Value Ref Range   Glucose-Capillary 94 70 - 99 mg/dL    Comment: Glucose reference range applies only to samples taken after fasting for at least 8 hours.  Basic metabolic panel     Status: Abnormal   Collection Time: 10/01/21  5:04 AM  Result Value Ref Range   Sodium 133 (L) 135 - 145 mmol/L   Potassium 2.9 (L) 3.5 - 5.1 mmol/L   Chloride 99 98 - 111 mmol/L   CO2 23 22 - 32 mmol/L   Glucose, Bld 104 (H) 70 - 99 mg/dL    Comment: Glucose reference range applies only to samples taken after fasting for at least 8 hours.   BUN 18 8 - 23 mg/dL   Creatinine, Ser 0.78 0.61 - 1.24 mg/dL   Calcium 8.5 (L) 8.9 - 10.3 mg/dL   GFR, Estimated >60 >60 mL/min    Comment: (NOTE) Calculated using the CKD-EPI Creatinine Equation (2021)    Anion gap 11 5 - 15    Comment: Performed at [facility], [place] [address], [place],  [address]  CBC     Status: Abnormal   Collection Time: 10/01/21  5:04 AM  Result Value Ref Range   WBC 9.1 4.0 - 10.5 K/uL   RBC 4.46 4.22 - 5.81 MIL/uL   Hemoglobin 12.5 (L) 13.0 - 17.0 g/dL   HCT 36.3 (L) 39.0 - 52.0 %   MCV 81.4 80.0 - 100.0 fL   MCH 28.0 26.0 - 34.0 pg   MCHC 34.4 30.0 - 36.0 g/dL   RDW 13.1 11.5 - 15.5 %   Platelets 261 150 - 400 K/uL   nRBC 0.0 0.0 - 0.2 %    Comment: Performed at [facility], [place] [address], [place], [place] [address]  Glucose, capillary  Status: None   Collection Time: 10/01/21  5:26 AM  Result Value Ref Range    Glucose-Capillary 98 70 - 99 mg/dL    Comment: Glucose reference range applies only to samples taken after fasting for at least 8 hours.  Glucose, capillary     Status: None   Collection Time: 10/01/21  7:39 AM  Result Value Ref Range   Glucose-Capillary 80 70 - 99 mg/dL    Comment: Glucose reference range applies only to samples taken after fasting for at least 8 hours.   Comment 1 Notify RN   Glucose, capillary     Status: Abnormal   Collection Time: 10/01/21 11:40 AM  Result Value Ref Range   Glucose-Capillary 124 (H) 70 - 99 mg/dL    Comment: Glucose reference range applies only to samples taken after fasting for at least 8 hours.   Comment 1 Notify RN   Glucose, capillary     Status: Abnormal   Collection Time: 10/01/21  5:15 PM  Result Value Ref Range   Glucose-Capillary 162 (H) 70 - 99 mg/dL    Comment: Glucose reference range applies only to samples taken after fasting for at least 8 hours.   Comment 1 Notify RN     MR FOOT LEFT W WO CONTRAST  Result Date: 09/30/2021 CLINICAL DATA:  Foot swelling, diabetic, osteomyelitis suspected, no prior imaging EXAM: MRI OF THE LEFT FOREFOOT WITHOUT AND WITH CONTRAST TECHNIQUE: Multiplanar, multisequence MR imaging of the left forefoot was performed both before and after administration of intravenous contrast. CONTRAST:  29mL GADAVIST GADOBUTROL 1 MMOL/ML IV SOLN COMPARISON:  Same day radiograph FINDINGS: Bones/Joint/Cartilage Prior transmetatarsal amputation. There is intense bony edema and enhancement with confluent low T1 signal within the medial cuneiform. Bony edema and enhancement within the base of the residual second metatarsal and medial aspect of the middle cuneiform. Ligaments The Lisfranc ligament is present and intact. Muscles and Tendons Diffuse intramuscular edema and atrophy in the foot as is commonly seen in diabetics. Fraying at the distal attachment of the peroneal longus tendon. Soft tissues There is a plantar medial soft  tissue ulcer which extends to the surface of the medial cuneiform, with internal fluid signal. This appears to connect with the skin surface and is likely draining. No other nondraining rim enhancing abscess in the foot. IMPRESSION: Osteomyelitis of the medial cuneiform. Probable early osteomyelitis of the base of the second metatarsal and medial aspect of the middle cuneiform. Adjacent plantar medial soft tissue ulcer, with continuous extension to the plantar surface of the medial cuneiform and is likely draining, correlate with exam. No other nondraining rim enhancing abscess identified. Diffuse soft tissue swelling and enhancement consistent with cellulitis. Electronically Signed   By: Caprice Renshaw M.D.   On: 09/30/2021 12:02   DG Foot Complete Left  Result Date: 09/30/2021 CLINICAL DATA:  Diabetic foot ulcer, evaluate for possible osteomyelitis. EXAM: LEFT FOOT - COMPLETE 3+ VIEW COMPARISON:  01/14/2021 FINDINGS: There are changes consistent with prior transmetatarsal amputation involving the second through fifth metatarsals. Near complete amputation of the first metatarsal is seen. Tarsal degenerative changes are noted. Soft tissue ulcer is noted along the inferior aspect of the foot and along the soft tissues near the amputation site. No definitive osteomyelitis is noted. No bony erosive changes are seen. IMPRESSION: Soft tissue defects consistent with the given clinical history. Postsurgical changes without evidence of osteomyelitis. Electronically Signed   By: Alcide Clever M.D.   On: 09/30/2021 02:52   VAS  [place] ABI WITH/WO TBI  Result Date: 09/30/2021  LOWER EXTREMITY DOPPLER STUDY Patient Name:  MARQUIZE SEIB  Date of Exam:   09/30/2021 Medical Rec #: [id number]       Accession #:    [id number] Date of Birth: 1961-11-04      Patient Gender: M Patient Age:   27 years Exam [facility]  [facility] Procedure:      VAS [place] ABI WITH/WO TBI Referring Phys: Victorino Dike YATES  --------------------------------------------------------------------------------  Indications: Ulceration. High Risk Factors: Diabetes. Other Factors: 12/11/2015 - AMPUTATION RIGHT FOOT GREAT TOE                 06/22/2018 - LEFT FOOT FIRST RAY AMPUTATION                 09/09/2018 - Left Transmetatarsal Amputation.  Comparison Study: 06/20/2018 - Right: Resting right ankle-brachial index is within                   normal range. No                   evidence of significant right lower extremity arterial                   disease.                    Unable to obtain TBI due to great toe amputation.                   Left: Resting left ankle-brachial index is within normal                   range. No                   evidence of significant left lower extremity arterial disease.                   The left                   toe-brachial index is normal. Performing Technologist: Olen Cordial RVT  Examination Guidelines: A complete evaluation includes at minimum, Doppler waveform signals and systolic blood pressure reading at the level of bilateral brachial, anterior tibial, and posterior tibial arteries, when vessel segments are accessible. Bilateral testing is considered an integral part of a complete examination. Photoelectric Plethysmograph (PPG) waveforms and toe systolic pressure readings are included as required and additional duplex testing as needed. Limited examinations for reoccurring indications may be performed as noted.  ABI Findings: +--------+------------------+-----+---------+--------+ Right   Rt Pressure (mmHg)IndexWaveform Comment  +--------+------------------+-----+---------+--------+ [id number]                    triphasic         +--------+------------------+-----+---------+--------+ PTA     154               1.18 triphasic         +--------+------------------+-----+---------+--------+ DP      148               1.14 triphasic          +--------+------------------+-----+---------+--------+ +--------+------------------+-----+---------+-------+ Left    Lt Pressure (mmHg)IndexWaveform Comment +--------+------------------+-----+---------+-------+ [id number]                    triphasic        +--------+------------------+-----+---------+-------+ PTA     150  1.15 biphasic         +--------+------------------+-----+---------+-------+ DP      141               1.08 biphasic         +--------+------------------+-----+---------+-------+ +-------+-----------+-----------+------------+------------+ ABI/TBIToday's ABIToday's TBIPrevious ABIPrevious TBI +-------+-----------+-----------+------------+------------+ Right  1.18                  1.15                     +-------+-----------+-----------+------------+------------+ Left   1.15                  1.07                     +-------+-----------+-----------+------------+------------+  Summary: Right: Resting right ankle-brachial index is within normal range. No evidence of significant right lower extremity arterial disease. Unable to obtain TBI due to great toe amputation. Left: Resting left ankle-brachial index is within normal range. No evidence of significant left lower extremity arterial disease. Unable to obtain TBI due to great toe amputation.  *See table(s) above for measurements and observations.  Electronically signed by Deitra Mayo MD on 09/30/2021 at 1:54:22 PM.    Final     Review of Systems Blood pressure 130/77, pulse 81, temperature 98.2 F (36.8 C), temperature source Oral, resp. rate 18, height 6' (1.829 m), weight 99.8 kg, SpO2 93 %. Physical Exam General: AAO x3, NAD  Dermatological: Full-thickness ulceration plantar medial aspect of the foot with purulence noted.  There is still slight edema, warmth of the foot.  There is no fluctuation or crepitation noted.  The wound on the heel previously is healed.  On the right  foot submetatarsal 1 superficial wound present with any probing, undermining or tunneling.  No edema, erythema or signs of infection.  Vascular: Dorsalis Pedis artery and Posterior Tibial artery pedal pulses are palpable bilateral with immedate capillary fill time. There is no pain with calf compression, swelling, warmth, erythema.   Neruologic: Sensation decreased  Musculoskeletal: Previous midfoot amputation  Assessment/Plan: Osteomyelitis, chronic ulceration left foot  -I reviewed the MRI as well as x-ray findings with him.  Discussed with him surgery Dr. Jess Barters recommended below-knee amputation.  The patient does not want to proceed with this.  He wants to go home tomorrow with antibiotics.  Discussed with him that I will think this is the best treatment option for him.  He will not proceed with amputation.  Discussed with him at least wound debridement, bone biopsies although he is already been on antibiotics so may not yield much value.  Patient understands and he agrees to debridement, bone biopsy.  We will proceed tomorrow for left foot wound debridement, bone biopsy.  Discussed he still may end up with amputation.  I discussed risks of surgery including spread of infection, loss of foot/leg, delayed or not healing as well as general risks of surgery including stroke, heart attack, death. NPO after midnight.  -ABIs normal -White blood cell count improved.  Continue to monitor.  Trula Slade 10/01/2021, 7:28 PM

## 2021-10-01 NOTE — Telephone Encounter (Signed)
Patient stated that he is at [facility] and would like to speak to Dr. Samuella Cota or our on call provider. Please call patient as soon as you can.

## 2021-10-01 NOTE — Telephone Encounter (Signed)
Patient stated that he is at [facility] and would like to speak to Dr. Samuella Cota. Please call patient as soon as you can.

## 2021-10-01 NOTE — Telephone Encounter (Signed)
Thank you for taking care of him.

## 2021-10-01 NOTE — Progress Notes (Signed)
PROGRESS NOTE    Charles Thompson  FFM:[id number] DOB: November 01, 1961 DOA: 09/30/2021 PCP: Haywood Pao, MD    Brief Narrative:  60 y.o. male with medical history significant of DM; HTN; HLD: and depression presenting with a diabetic foot ulcer. He quit smoking 8-9 days ago and was thinking he was having issues with that.  He was vomiting with eating, got so weak he couldn't take it anymore.  He had an appointment with his foot doctor this AM but couldn't wait.  He had antibiotics at home but couldn't find them.  The foot started turning red a few days ago.  He feels better after the abx and fluid and he is so sleepy. He has had prior amputations - L TMA and R big toe in maybe 2016 or 2017.  +chills, sweats, unsure if he has had fever.  Assessment & Plan:   Principal Problem:   Diabetic foot infection ([place]) Active Problems:   Diabetic ulcer of left foot associated with type 2 diabetes mellitus ([place])   History of transmetatarsal amputation of left foot (H[facility])   Major depressive disorder, recurrent episode, in remission with seasonal pattern ([place])   Tobacco abuse   Type 2 diabetes mellitus with diabetic neuropathy, with long-term current use of insulin ([place])   Cellulitis   AKI (acute kidney injury) ([place])   Mood disorder ([place])  Diabetic foot infection -Patient is s/p L TMA and has a chronic L midfoot ulcer that is followed by podiatry -Foot ulcers are present along the plantar L foot and now the patient has developed surrounding cellulitis -Xray negative for deep infection but MRI shows osteo -Negative lactate, no current concerns for sepsis -Pt has been started on IV antibiotics (Rocephin/Flagyl/Vancomycin) -Dr. Sharol Given has been consulted per admitting physician; he asks that the patient be admitted at [facility] instead of [facility] and so order was changed, still pending bed availability -Based on refractory ulcer infection with osteitis, it seems that the patient may be heading for additional  amputation -discussed case with Orhtopedics, who had reviewed chart and images and is already anticipating BKA -Patient has h/o PVD, thus ABIs were ordered in case revascularization may be indicated -LE wound order set utilized including labs (CRP, ESR, A1c, prealbumin, HIV, and blood cultures) and consults (diabetes coordinator; peripheral vascular navigator; TOC team; wound care; and nutrition)  -Have consulted Podiatry while here   AKI -Likely related to acute infection -Hold Zestoretic for now -Renal function improved -Cont to avoid nephrotoxic agents   DM -Will check A1c -hold Synjardy -Toujeo ordered but held due to low glucose today -Cont with moderate-scale SSI    HTN -Continue Norvasc -Hold Zestoretic for now   HLD -Continue Lipitor   Mood d/o -Continue Zoloft, Klonopin -Wanting to leave and asked to be discharged. Pt understands that given condition of his LE and recommendations by specialist for surgery/amputation that discharge now is not advisable    Tobacco dependence -He quit 8-9 days ago -Ongoing cessation efforts are strongly encouraged. -Patch ordered at patient request.     DVT prophylaxis: Lovenox subq Code Status: Full Family Communication: Pt in room, family not at bedside  Status is: Inpatient  Remains inpatient appropriate because: Severity of illness   Consultants:  Orthopedic Surgery Podiatry  Procedures:    Antimicrobials: Anti-infectives (From admission, onward)    Start     Dose/Rate Route Frequency Ordered Stop   09/30/21 1500  vancomycin (VANCOCIN) IVPB 1000 mg/200 mL premix  1,000 mg 200 mL/hr over 60 Minutes Intravenous Every 12 hours 09/30/21 1026 10/07/21 0259   09/30/21 1000  cefTRIAXone (ROCEPHIN) 2 g in sodium chloride 0.9 % 100 mL IVPB        2 g 200 mL/hr over 30 Minutes Intravenous Every 24 hours 09/30/21 0908 10/07/21 0959   09/30/21 1000  metroNIDAZOLE (FLAGYL) IVPB 500 mg        500 mg 100 mL/hr over 60  Minutes Intravenous Every 12 hours 09/30/21 0908 10/07/21 0959   09/30/21 0245  vancomycin (VANCOCIN) IVPB 1000 mg/200 mL premix        1,000 mg 200 mL/hr over 60 Minutes Intravenous  Once 09/30/21 0238 09/30/21 0442       Subjective: Very much wanting to go home  Objective: Vitals:   09/30/21 1833 09/30/21 2254 10/01/21 0642 10/01/21 1110  BP: (!) 147/78 137/70 (!) 132/52 130/77  Pulse: 98 99 89 81  Resp:  18 18   Temp: 98.6 F (37 C) 97.8 F (36.6 C) 98 F (36.7 C)   TempSrc: Oral Oral Oral   SpO2: 99% 96% 93%   Weight:      Height:        Intake/Output Summary (Last 24 hours) at 10/01/2021 1753 Last data filed at 10/01/2021 0900 Gross per 24 hour  Intake 1220.84 ml  Output --  Net 1220.84 ml   Filed Weights   09/30/21 0934  Weight: 99.8 kg    Examination: General exam: Awake, laying in bed, in nad Respiratory system: Normal respiratory effort, no wheezing Cardiovascular system: regular rate, s1, s2 Gastrointestinal system: Soft, nondistended, positive BS Central nervous system: CN2-12 grossly intact, strength intact Extremities: Perfused, no clubbing, LLE with dressings in place Skin: Normal skin turgor, no notable skin lesions seen Psychiatry: Mood normal // no visual hallucinations   Data Reviewed: I have personally reviewed following labs and imaging studies  CBC: Recent Labs  Lab 09/30/21 0130 10/01/21 0504  WBC 13.8* 9.1  NEUTROABS 10.9*  --   HGB 14.5 12.5*  HCT 41.7 36.3*  MCV 80.8 81.4  PLT 309 174   Basic Metabolic Panel: Recent Labs  Lab 09/30/21 0130 10/01/21 0504  NA 128* 133*  K 3.4* 2.9*  CL 91* 99  CO2 21* 23  GLUCOSE 149* 104*  BUN 28* 18  CREATININE 1.41* 0.78  CALCIUM 8.8* 8.5*   GFR: Estimated Creatinine Clearance: 118.6 mL/min (by C-G formula based on SCr of 0.78 mg/dL). Liver Function Tests: Recent Labs  Lab 09/30/21 0130  AST 17  ALT 23  ALKPHOS 95  BILITOT 1.1  PROT 7.8  ALBUMIN 3.6   No results for  input(s): LIPASE, AMYLASE in the last 168 hours. No results for input(s): AMMONIA in the last 168 hours. Coagulation Profile: No results for input(s): INR, PROTIME in the last 168 hours. Cardiac Enzymes: No results for input(s): CKTOTAL, CKMB, CKMBINDEX, TROPONINI in the last 168 hours. BNP (last 3 results) No results for input(s): PROBNP in the last 8760 hours. HbA1C: Recent Labs    09/30/21 0941  HGBA1C 6.3*   CBG: Recent Labs  Lab 10/01/21 0239 10/01/21 0526 10/01/21 0739 10/01/21 1140 10/01/21 1715  GLUCAP 94 98 80 124* 162*   Lipid Profile: No results for input(s): CHOL, HDL, LDLCALC, TRIG, CHOLHDL, LDLDIRECT in the last 72 hours. Thyroid Function Tests: No results for input(s): TSH, T4TOTAL, FREET4, T3FREE, THYROIDAB in the last 72 hours. Anemia Panel: No results for input(s): VITAMINB12, FOLATE, FERRITIN, TIBC, IRON, RETICCTPCT  in the last 72 hours. Sepsis Labs: Recent Labs  Lab 09/30/21 0110  LATICACIDVEN 1.5    Recent Results (from the past 240 hour(s))  Resp Panel by RT-PCR (Flu A&B, Covid) Peripheral     Status: None   Collection Time: 09/30/21  1:27 AM   Specimen: Peripheral; Nasopharyngeal(NP) swabs in vial transport medium  Result Value Ref Range Status   SARS Coronavirus 2 by RT PCR NEGATIVE NEGATIVE Final    Comment: (NOTE) SARS-CoV-2 target nucleic acids are NOT DETECTED.  The SARS-CoV-2 RNA is generally detectable in upper respiratory specimens during the acute phase of infection. The lowest concentration of SARS-CoV-2 viral copies this assay can detect is 138 copies/mL. A negative result does not preclude SARS-Cov-2 infection and should not be used as the sole basis for treatment or other patient management decisions. A negative result may occur with  improper specimen collection/handling, submission of specimen other than nasopharyngeal swab, presence of viral mutation(s) within the areas targeted by this assay, and inadequate number of  viral copies(<138 copies/mL). A negative result must be combined with clinical observations, patient history, and epidemiological information. The expected result is Negative.  Fact Sheet for Patients:  EntrepreneurPulse.com.au  Fact Sheet for Healthcare Providers:  IncredibleEmployment.be  This test is no t yet approved or cleared by the [place] FDA and  has been authorized for detection and/or diagnosis of SARS-CoV-2 by FDA under an Emergency Use Authorization (EUA). This EUA will remain  in effect (meaning this test can be used) for the duration of the COVID-19 declaration under Section 564(b)(1) of the Act, 21 U.S.C.section 360bbb-3(b)(1), unless the authorization is terminated  or revoked sooner.       Influenza A by PCR NEGATIVE NEGATIVE Final   Influenza B by PCR NEGATIVE NEGATIVE Final    Comment: (NOTE) The Xpert Xpress SARS-CoV-2/FLU/RSV plus assay is intended as an aid in the diagnosis of influenza from Nasopharyngeal swab specimens and should not be used as a sole basis for treatment. Nasal washings and aspirates are unacceptable for Xpert Xpress SARS-CoV-2/FLU/RSV testing.  Fact Sheet for Patients: EntrepreneurPulse.com.au  Fact Sheet for Healthcare Providers: IncredibleEmployment.be  This test is not yet approved or cleared by the [place] FDA and has been authorized for detection and/or diagnosis of SARS-CoV-2 by FDA under an Emergency Use Authorization (EUA). This EUA will remain in effect (meaning this test can be used) for the duration of the COVID-19 declaration under Section 564(b)(1) of the Act, 21 U.S.C. section 360bbb-3(b)(1), unless the authorization is terminated or revoked.  Performed at [facility], [place] [address] [place], [place] [address]   Blood culture (routine x 2)     Status: None (Preliminary result)   Collection Time: 09/30/21  1:30  AM   Specimen: BLOOD LEFT HAND  Result Value Ref Range Status   Specimen Description   Final    BLOOD LEFT HAND Performed at [facility], [place] [address] [place], [place] [address]    Special Requests   Final    BOTTLES DRAWN AEROBIC AND ANAEROBIC Blood Culture adequate volume Performed at [place] [address], [place], [place] [address]    Culture   Final    NO GROWTH 1 DAY Performed at [facility], [place] [address] [place], [place] [address]    Report Status PENDING  Incomplete  Blood culture (routine x 2)     Status: None (Preliminary result)   Collection Time: 09/30/21  1:30  AM   Specimen: BLOOD  Result Value Ref Range Status   Specimen Description   Final    BLOOD RIGHT ANTECUBITAL Performed at [place] [address] [place], [place] [address]    Special Requests   Final    BOTTLES DRAWN AEROBIC AND ANAEROBIC Blood Culture adequate volume Performed at [place] [address] [place], [place] [address]    Culture   Final    NO GROWTH 1 DAY Performed at [facility], [place] [address] [place], [place] [address]    Report Status PENDING  Incomplete  Aerobic/Anaerobic Culture w Gram Stain (surgical/deep wound)     Status: None (Preliminary result)   Collection Time: 09/30/21  2:34 AM   Specimen: Foot; Wound  Result Value Ref Range Status   Specimen Description   Final    FOOT LEFT Performed at [facility], [place] [address], [place], [place] [address]    Special Requests   Final    NONE Performed at [facility], [place] [address] [place], [place] [address]    Gram Stain   Final    FEW WBC PRESENT,BOTH PMN AND MONONUCLEAR FEW GRAM POSITIVE COCCI IN PAIRS    Culture   Final    FEW PROTEUS MIRABILIS CULTURE REINCUBATED FOR BETTER GROWTH Performed at [facility], [place] [address], [place], [place] [address]    Report Status PENDING  Incomplete      Radiology Studies: MR FOOT LEFT W WO CONTRAST  Result Date: 09/30/2021 CLINICAL DATA:  Foot swelling, diabetic, osteomyelitis suspected, no prior imaging EXAM: MRI OF THE LEFT FOREFOOT WITHOUT AND WITH CONTRAST TECHNIQUE: Multiplanar, multisequence MR imaging of the left forefoot was performed both before and after administration of intravenous contrast. CONTRAST:  33m GADAVIST GADOBUTROL 1 MMOL/ML IV SOLN COMPARISON:  Same day radiograph FINDINGS: Bones/Joint/Cartilage Prior transmetatarsal amputation. There is intense bony edema and enhancement with confluent low T1 signal within the medial cuneiform. Bony edema and enhancement within the base of the residual second metatarsal and medial aspect of the middle cuneiform. Ligaments The Lisfranc ligament is present and intact. Muscles and Tendons Diffuse intramuscular edema and atrophy in the foot as is commonly seen in diabetics. Fraying at the distal attachment of the peroneal longus tendon. Soft tissues There is a plantar medial soft tissue ulcer which extends to the surface of the medial cuneiform, with internal fluid signal. This appears to connect with the skin surface and is likely draining. No other nondraining rim enhancing abscess in the foot. IMPRESSION: Osteomyelitis of the medial cuneiform. Probable early osteomyelitis of the base of the second metatarsal and medial aspect of the middle cuneiform. Adjacent plantar medial soft tissue ulcer, with continuous extension to the plantar surface of the medial cuneiform and is likely draining, correlate with exam. No other nondraining rim enhancing abscess identified. Diffuse soft tissue swelling and enhancement consistent with cellulitis. Electronically Signed   By: JMaurine SimmeringM.D.   On: 09/30/2021 12:02   DG Foot Complete Left  Result Date: 09/30/2021 CLINICAL DATA:  Diabetic foot ulcer, evaluate for possible osteomyelitis. EXAM: LEFT FOOT - COMPLETE 3+ VIEW COMPARISON:  01/14/2021 FINDINGS: There  are changes consistent with prior transmetatarsal amputation involving the second through fifth metatarsals. Near complete amputation of the first metatarsal is seen. Tarsal degenerative changes are noted. Soft tissue ulcer is noted along the inferior aspect of the foot and along the soft tissues near the amputation site.  No definitive osteomyelitis is noted. No bony erosive changes are seen. IMPRESSION: Soft tissue defects consistent with the given clinical history. Postsurgical changes without evidence of osteomyelitis. Electronically Signed   By: Inez Catalina M.D.   On: 09/30/2021 02:52   VAS [place] ABI WITH/WO TBI  Result Date: 09/30/2021  LOWER EXTREMITY DOPPLER STUDY Patient Name:  YANIV LAGE  Date of Exam:   09/30/2021 Medical Rec #: [id number]       Accession #:    [id number] Date of Birth: March 07, 1961      Patient Gender: M Patient Age:   71 years Exam Location:  [facility] Procedure:      VAS [place] ABI WITH/WO TBI Referring Phys: Anderson Malta YATES --------------------------------------------------------------------------------  Indications: Ulceration. High Risk Factors: Diabetes. Other Factors: 12/11/2015 - AMPUTATION RIGHT FOOT GREAT TOE                 06/22/2018 - LEFT FOOT FIRST RAY AMPUTATION                 09/09/2018 - Left Transmetatarsal Amputation.  Comparison Study: 06/20/2018 - Right: Resting right ankle-brachial index is within                   normal range. No                   evidence of significant right lower extremity arterial                   disease.                    Unable to obtain TBI due to great toe amputation.                   Left: Resting left ankle-brachial index is within normal                   range. No                   evidence of significant left lower extremity arterial disease.                   The left                   toe-brachial index is normal. Performing Technologist: Carlos Levering RVT  Examination Guidelines: A complete evaluation includes at minimum,  Doppler waveform signals and systolic blood pressure reading at the level of bilateral brachial, anterior tibial, and posterior tibial arteries, when vessel segments are accessible. Bilateral testing is considered an integral part of a complete examination. Photoelectric Plethysmograph (PPG) waveforms and toe systolic pressure readings are included as required and additional duplex testing as needed. Limited examinations for reoccurring indications may be performed as noted.  ABI Findings: +--------+------------------+-----+---------+--------+ Right   Rt Pressure (mmHg)IndexWaveform Comment  +--------+------------------+-----+---------+--------+ [id number]                    triphasic         +--------+------------------+-----+---------+--------+ PTA     154               1.18 triphasic         +--------+------------------+-----+---------+--------+ DP      148               1.14 triphasic         +--------+------------------+-----+---------+--------+ +--------+------------------+-----+---------+-------+ Left    Lt Pressure (mmHg)IndexWaveform Comment +--------+------------------+-----+---------+-------+  [id number]                    triphasic        +--------+------------------+-----+---------+-------+ PTA     150               1.15 biphasic         +--------+------------------+-----+---------+-------+ DP      141               1.08 biphasic         +--------+------------------+-----+---------+-------+ +-------+-----------+-----------+------------+------------+ ABI/TBIToday's ABIToday's TBIPrevious ABIPrevious TBI +-------+-----------+-----------+------------+------------+ Right  1.18                  1.15                     +-------+-----------+-----------+------------+------------+ Left   1.15                  1.07                     +-------+-----------+-----------+------------+------------+  Summary: Right: Resting right ankle-brachial index  is within normal range. No evidence of significant right lower extremity arterial disease. Unable to obtain TBI due to great toe amputation. Left: Resting left ankle-brachial index is within normal range. No evidence of significant left lower extremity arterial disease. Unable to obtain TBI due to great toe amputation.  *See table(s) above for measurements and observations.  Electronically signed by Deitra Mayo MD on 09/30/2021 at 1:54:22 PM.    Final     Scheduled Meds:  amLODipine  10 mg Oral Daily   aspirin EC  81 mg Oral Daily   atorvastatin  10 mg Oral Daily   docusate sodium  100 mg Oral BID   enoxaparin (LOVENOX) injection  40 mg Subcutaneous Daily   [START ON 10/02/2021] feeding supplement  237 mL Oral BID BM   feeding supplement (GLUCERNA SHAKE)  237 mL Oral TID BM   insulin aspart  0-15 Units Subcutaneous TID WC   insulin aspart  0-5 Units Subcutaneous QHS   insulin glargine-yfgn  50 Units Subcutaneous BID   multivitamin with minerals  1 tablet Oral Daily   nicotine  21 mg Transdermal Daily   [START ON 10/02/2021] nutrition supplement (JUVEN)  1 packet Oral BID BM   potassium chloride  40 mEq Oral BID   sertraline  100 mg Oral BID   Continuous Infusions:  sodium chloride 75 mL/hr at 10/01/21 1208   cefTRIAXone (ROCEPHIN)  IV 2 g (10/01/21 1210)   metronidazole 500 mg (10/01/21 1341)   vancomycin 1,000 mg (10/01/21 1634)     LOS: 1 day   Marylu Lund, MD [facility] Pager On Amion  If 7PM-7AM, please contact night-coverage 10/01/2021, 5:53 PM

## 2021-10-02 ENCOUNTER — Inpatient Hospital Stay (HOSPITAL_COMMUNITY): Payer: Medicare Other | Admitting: Anesthesiology

## 2021-10-02 ENCOUNTER — Encounter (HOSPITAL_COMMUNITY): Payer: Self-pay | Admitting: Internal Medicine

## 2021-10-02 ENCOUNTER — Encounter (HOSPITAL_COMMUNITY)
Admission: EM | Discharge status: Against Medical Advice | Payer: Self-pay | Source: Home / Self Care | Attending: Internal Medicine

## 2021-10-02 DIAGNOSIS — L089 Local infection of the skin and subcutaneous tissue, unspecified: Secondary | ICD-10-CM | POA: Diagnosis not present

## 2021-10-02 DIAGNOSIS — M86672 Other chronic osteomyelitis, left ankle and foot: Secondary | ICD-10-CM

## 2021-10-02 DIAGNOSIS — R7881 Bacteremia: Secondary | ICD-10-CM | POA: Diagnosis not present

## 2021-10-02 DIAGNOSIS — L97529 Non-pressure chronic ulcer of other part of left foot with unspecified severity: Secondary | ICD-10-CM

## 2021-10-02 DIAGNOSIS — E11628 Type 2 diabetes mellitus with other skin complications: Secondary | ICD-10-CM | POA: Diagnosis not present

## 2021-10-02 DIAGNOSIS — A419 Sepsis, unspecified organism: Secondary | ICD-10-CM

## 2021-10-02 DIAGNOSIS — R6520 Severe sepsis without septic shock: Secondary | ICD-10-CM

## 2021-10-02 HISTORY — PX: WOUND DEBRIDEMENT: SHX247

## 2021-10-02 LAB — COMPREHENSIVE METABOLIC PANEL
ALT: 17 U/L (ref 0–44)
AST: 13 U/L — ABNORMAL LOW (ref 15–41)
Albumin: 3 g/dL — ABNORMAL LOW (ref 3.5–5.0)
Alkaline Phosphatase: 79 U/L (ref 38–126)
Anion gap: 5 (ref 5–15)
BUN: 15 mg/dL (ref 8–23)
CO2: 25 mmol/L (ref 22–32)
Calcium: 8.3 mg/dL — ABNORMAL LOW (ref 8.9–10.3)
Chloride: 105 mmol/L (ref 98–111)
Creatinine, Ser: 0.83 mg/dL (ref 0.61–1.24)
GFR, Estimated: >60 mL/min (ref >60)
Glucose, Bld: 136 mg/dL — ABNORMAL HIGH (ref 70–99)
Potassium: 3.7 mmol/L (ref 3.5–5.1)
Sodium: 135 mmol/L (ref 135–145)
Total Bilirubin: 0.5 mg/dL (ref 0.3–1.2)
Total Protein: 6.7 g/dL (ref 6.5–8.1)

## 2021-10-02 LAB — BLOOD CULTURE ID PANEL (REFLEXED) - BCID2

## 2021-10-02 LAB — GLUCOSE, CAPILLARY
Glucose-Capillary: 80 mg/dL (ref 70–99)
Glucose-Capillary: 91 mg/dL (ref 70–99)
Glucose-Capillary: 80 mg/dL (ref 70–99)
Glucose-Capillary: 203 mg/dL — ABNORMAL HIGH (ref 70–99)
Glucose-Capillary: 200 mg/dL — ABNORMAL HIGH (ref 70–99)
Glucose-Capillary: 232 mg/dL — ABNORMAL HIGH (ref 70–99)

## 2021-10-02 SURGERY — DEBRIDEMENT, WOUND
Anesthesia: Monitor Anesthesia Care | Laterality: Left

## 2021-10-02 MED ORDER — DEXAMETHASONE SODIUM PHOSPHATE 10 MG/ML IJ SOLN
INTRAMUSCULAR | Status: AC
  Filled 2021-10-02: qty 1

## 2021-10-02 MED ORDER — LIDOCAINE HCL (PF) 2 % IJ SOLN
INTRAMUSCULAR | Status: AC
  Filled 2021-10-02: qty 10

## 2021-10-02 MED ORDER — BUPIVACAINE HCL (PF) 0.5 % IJ SOLN
INTRAMUSCULAR | Status: AC
  Filled 2021-10-02: qty 30

## 2021-10-02 MED ORDER — DEXTROSE IN LACTATED RINGERS 5 % IV SOLN: INTRAVENOUS | Status: DC

## 2021-10-02 MED ORDER — BUPIVACAINE HCL (PF) 0.5 % IJ SOLN
INTRAMUSCULAR | Status: DC | PRN
  Administered 2021-10-02: 10 mL

## 2021-10-02 MED ORDER — FENTANYL CITRATE (PF) 250 MCG/5ML IJ SOLN
INTRAMUSCULAR | Status: DC | PRN
  Administered 2021-10-02 (×5): 50 ug via INTRAVENOUS

## 2021-10-02 MED ORDER — ACETAMINOPHEN 500 MG PO TABS
ORAL_TABLET | ORAL | Status: AC
  Administered 2021-10-02: 17:00:00 1000 mg via ORAL
  Filled 2021-10-02: qty 2

## 2021-10-02 MED ORDER — LIDOCAINE HCL (CARDIAC) PF 100 MG/5ML IV SOSY
PREFILLED_SYRINGE | INTRAVENOUS | Status: DC | PRN
  Administered 2021-10-02: 40 mg via INTRAVENOUS

## 2021-10-02 MED ORDER — SUCCINYLCHOLINE CHLORIDE 200 MG/10ML IV SOSY
PREFILLED_SYRINGE | INTRAVENOUS | Status: AC
  Filled 2021-10-02: qty 10

## 2021-10-02 MED ORDER — ONDANSETRON HCL 4 MG/2ML IJ SOLN
INTRAMUSCULAR | Status: AC
  Filled 2021-10-02: qty 2

## 2021-10-02 MED ORDER — HYDROMORPHONE HCL 1 MG/ML IJ SOLN: 0.2500 mg | INTRAMUSCULAR | Status: DC | PRN

## 2021-10-02 MED ORDER — SODIUM CHLORIDE 0.9 % IR SOLN
Status: DC | PRN
  Administered 2021-10-02: 1000 mL

## 2021-10-02 MED ORDER — MIDAZOLAM HCL 2 MG/2ML IJ SOLN
INTRAMUSCULAR | Status: AC
  Filled 2021-10-02: qty 2

## 2021-10-02 MED ORDER — ONDANSETRON HCL 4 MG/2ML IJ SOLN: 4.0000 mg | Freq: Once | INTRAMUSCULAR | Status: DC | PRN

## 2021-10-02 MED ORDER — PHENYLEPHRINE 40 MCG/ML (10ML) SYRINGE FOR IV PUSH (FOR BLOOD PRESSURE SUPPORT)
PREFILLED_SYRINGE | INTRAVENOUS | Status: DC | PRN
  Administered 2021-10-02: 80 ug via INTRAVENOUS
  Administered 2021-10-02: 120 ug via INTRAVENOUS
  Administered 2021-10-02: 200 ug via INTRAVENOUS
  Administered 2021-10-02: 80 ug via INTRAVENOUS

## 2021-10-02 MED ORDER — LIDOCAINE HCL (PF) 1 % IJ SOLN
INTRAMUSCULAR | Status: AC
  Filled 2021-10-02: qty 30

## 2021-10-02 MED ORDER — ONDANSETRON HCL 4 MG/2ML IJ SOLN
INTRAMUSCULAR | Status: DC | PRN
  Administered 2021-10-02: 4 mg via INTRAVENOUS

## 2021-10-02 MED ORDER — MIDAZOLAM HCL 5 MG/5ML IJ SOLN
INTRAMUSCULAR | Status: DC | PRN
  Administered 2021-10-02 (×2): 1 mg via INTRAVENOUS

## 2021-10-02 MED ORDER — OXYCODONE HCL 5 MG/5ML PO SOLN: 5.0000 mg | Freq: Once | ORAL | Status: DC | PRN

## 2021-10-02 MED ORDER — ACETAMINOPHEN 500 MG PO TABS: 1000.0000 mg | ORAL_TABLET | Freq: Once | ORAL | Status: AC

## 2021-10-02 MED ORDER — LORAZEPAM 0.5 MG PO TABS
0.5000 mg | ORAL_TABLET | Freq: Once | ORAL | Status: AC
  Administered 2021-10-02: 03:00:00 0.5 mg via ORAL
  Filled 2021-10-02: qty 1

## 2021-10-02 MED ORDER — LIDOCAINE HCL 1 % IJ SOLN
INTRAMUSCULAR | Status: DC | PRN
  Administered 2021-10-02: 10 mL

## 2021-10-02 MED ORDER — CHLORHEXIDINE GLUCONATE 0.12 % MT SOLN
15.0000 mL | Freq: Once | OROMUCOSAL | Status: AC
  Administered 2021-10-02: 17:00:00 15 mL via OROMUCOSAL

## 2021-10-02 MED ORDER — PHENYLEPHRINE 40 MCG/ML (10ML) SYRINGE FOR IV PUSH (FOR BLOOD PRESSURE SUPPORT)
PREFILLED_SYRINGE | INTRAVENOUS | Status: AC
  Filled 2021-10-02: qty 20

## 2021-10-02 MED ORDER — PROPOFOL 10 MG/ML IV BOLUS
INTRAVENOUS | Status: DC | PRN
  Administered 2021-10-02 (×4): 20 mg via INTRAVENOUS

## 2021-10-02 MED ORDER — DEXTROSE-NACL 5-0.9 % IV SOLN: INTRAVENOUS | Status: DC

## 2021-10-02 MED ORDER — OXYCODONE HCL 5 MG PO TABS: 5.0000 mg | ORAL_TABLET | Freq: Once | ORAL | Status: DC | PRN

## 2021-10-02 MED ORDER — LACTATED RINGERS IV SOLN: INTRAVENOUS | Status: DC

## 2021-10-02 MED ORDER — PROPOFOL 10 MG/ML IV BOLUS
INTRAVENOUS | Status: AC
  Filled 2021-10-02: qty 20

## 2021-10-02 MED ORDER — INSULIN GLARGINE-YFGN 100 UNIT/ML ~~LOC~~ SOLN
20.0000 [IU] | Freq: Two times a day (BID) | SUBCUTANEOUS | Status: DC
  Administered 2021-10-02 – 2021-10-03 (×3): 20 [IU] via SUBCUTANEOUS
  Filled 2021-10-02 (×4): qty 0.2

## 2021-10-02 MED ORDER — FENTANYL CITRATE (PF) 250 MCG/5ML IJ SOLN
INTRAMUSCULAR | Status: AC
  Filled 2021-10-02: qty 5

## 2021-10-02 MED ORDER — INSULIN GLARGINE-YFGN 100 UNIT/ML ~~LOC~~ SOLN: 20.0000 [IU] | Freq: Two times a day (BID) | SUBCUTANEOUS | Status: DC

## 2021-10-02 MED ORDER — PROPOFOL 500 MG/50ML IV EMUL
INTRAVENOUS | Status: DC | PRN
  Administered 2021-10-02: 100 ug/kg/min via INTRAVENOUS

## 2021-10-02 SURGICAL SUPPLY — 59 items
BAG COUNTER SPONGE SURGICOUNT (BAG) IMPLANT
BLADE HEX COATED 2.75 (ELECTRODE) ×2 IMPLANT
BLADE OSCILLATING/SAGITTAL (BLADE) ×1
BLADE SURG 15 STRL LF DISP TIS (BLADE) ×2 IMPLANT
BLADE SURG 15 STRL SS (BLADE) ×2
BLADE SW THK.38XMED LNG THN (BLADE) ×1 IMPLANT
BNDG CONFORM 2 STRL LF (GAUZE/BANDAGES/DRESSINGS) ×2 IMPLANT
BNDG ELASTIC 3X5.8 VLCR STR LF (GAUZE/BANDAGES/DRESSINGS) ×2 IMPLANT
BNDG ELASTIC 4X5.8 VLCR STR LF (GAUZE/BANDAGES/DRESSINGS) ×2 IMPLANT
BNDG ELASTIC 6X5.8 VLCR STR LF (GAUZE/BANDAGES/DRESSINGS) ×2 IMPLANT
BNDG ESMARK 4X9 LF (GAUZE/BANDAGES/DRESSINGS) ×2 IMPLANT
BNDG GAUZE ELAST 4 BULKY (GAUZE/BANDAGES/DRESSINGS) ×2 IMPLANT
BUR EGG ELITE 4.0 (BURR) ×2 IMPLANT
COVER BACK TABLE 60X90IN (DRAPES) ×2 IMPLANT
CUFF TOURN SGL QUICK 18X4 (TOURNIQUET CUFF) IMPLANT
DRAPE EXTREMITY T 121X128X90 (DISPOSABLE) ×2 IMPLANT
DRAPE IMP U-DRAPE 54X76 (DRAPES) ×2 IMPLANT
DRAPE OEC MINIVIEW 54X84 (DRAPES) ×2 IMPLANT
DRSG EMULSION OIL 3X3 NADH (GAUZE/BANDAGES/DRESSINGS) ×2 IMPLANT
DRSG PAD ABDOMINAL 8X10 ST (GAUZE/BANDAGES/DRESSINGS) ×2 IMPLANT
DURAPREP 26ML APPLICATOR (WOUND CARE) IMPLANT
ELECT REM PT RETURN 15FT ADLT (MISCELLANEOUS) ×2 IMPLANT
GAUZE 4X4 16PLY ~~LOC~~+RFID DBL (SPONGE) IMPLANT
GAUZE SPONGE 4X4 12PLY STRL (GAUZE/BANDAGES/DRESSINGS) ×2 IMPLANT
GAUZE XEROFORM 1X8 LF (GAUZE/BANDAGES/DRESSINGS) ×2 IMPLANT
GLOVE SURG ENC MOIS LTX SZ7.5 (GLOVE) ×4 IMPLANT
GLOVE SURG NEOP MICRO LF SZ7.5 (GLOVE) ×2 IMPLANT
GLOVE SURG UNDER POLY LF SZ7.5 (GLOVE) ×2 IMPLANT
GOWN STRL REUS W/ TWL LRG LVL3 (GOWN DISPOSABLE) ×1 IMPLANT
GOWN STRL REUS W/ TWL XL LVL3 (GOWN DISPOSABLE) ×1 IMPLANT
GOWN STRL REUS W/TWL LRG LVL3 (GOWN DISPOSABLE) ×1
GOWN STRL REUS W/TWL XL LVL3 (GOWN DISPOSABLE) ×1
KIT BASIN OR (CUSTOM PROCEDURE TRAY) ×2 IMPLANT
KIT TURNOVER KIT A (KITS) IMPLANT
NDL SAFETY ECLIPSE 18X1.5 (NEEDLE) ×1 IMPLANT
NEEDLE BIOPSY JAMSHIDI 11X6 (NEEDLE) ×2 IMPLANT
NEEDLE HYPO 18GX1.5 SHARP (NEEDLE) ×1
NEEDLE HYPO 22GX1.5 SAFETY (NEEDLE) ×2 IMPLANT
NEEDLE HYPO 25X1 1.5 SAFETY (NEEDLE) ×4 IMPLANT
NS IRRIG 1000ML POUR BTL (IV SOLUTION) IMPLANT
PADDING CAST ABS 4INX4YD NS (CAST SUPPLIES)
PADDING CAST ABS COTTON 4X4 ST (CAST SUPPLIES) IMPLANT
PENCIL SMOKE EVACUATOR (MISCELLANEOUS) ×2 IMPLANT
STOCKINETTE 6  STRL (DRAPES) ×1
STOCKINETTE 6 STRL (DRAPES) ×1 IMPLANT
STRIP SUTURE WOUND CLOSURE 1/2 (MISCELLANEOUS) IMPLANT
SUT ETHILON 3 0 PS 1 (SUTURE) ×2 IMPLANT
SUT MNCRL AB 3-0 PS2 18 (SUTURE) ×2 IMPLANT
SUT MNCRL AB 4-0 PS2 18 (SUTURE) IMPLANT
SUT MON AB 5-0 PS2 18 (SUTURE) IMPLANT
SUT PROLENE 2 0 SH DA (SUTURE) ×2 IMPLANT
SUT PROLENE 3 0 PS 2 (SUTURE) ×2 IMPLANT
SYR 10ML LL (SYRINGE) IMPLANT
SYR 20ML LL LF (SYRINGE) ×2 IMPLANT
SYR BULB EAR ULCER 3OZ GRN STR (SYRINGE) ×2 IMPLANT
SYR CONTROL 10ML LL (SYRINGE) ×4 IMPLANT
TUBING CONNECTING 10 (TUBING) ×2 IMPLANT
UNDERPAD 30X36 HEAVY ABSORB (UNDERPADS AND DIAPERS) ×2 IMPLANT
YANKAUER SUCT BULB TIP NO VENT (SUCTIONS) ×2 IMPLANT

## 2021-10-02 NOTE — Consult Note (Signed)
[facility]    Date of Admission:  09/30/2021     Reason for Consult: diabetic foot infection    Referring Provider: Wyline Copas      Lines:  Peripheral iv's  Abx: 11/15-c ceftriaxone  11/15-17 metronidazole 11/15-17 vanc        Assessment: 60 yo male former smoker, recurrent diabetic foot infection, here for same on left side and also complicated by proteus bacteremia  He is waiting for I&D of the left foot plantar ulcer. Mri suggest midfoot cuneiform imaging OM and 2nd metatarsal base; no obvious abscess  Superficial cx proteus mirabilis and few staph aureus (cx pending susceptibility)  Sepsis on presentation along with proteus bacteremia. Clinically improving.     Plan: No necessary change in abx -- even reasonable to hold prior to tissue sampling. However tissue sampling to occur this pm Once deep tissue cx available can adjust to targetted therapy If patient adamant on leaving prior to culture result finalized, could send out on doxy 100 mg po bid and augmentin 875 mg po bid (should cover for his proteus bsi) I will follow up in 1 week in clinic to adjust abx Likely will need 6 weeks abx Discussed with primary team     Clinic Follow Up Appt: 11/23 @ 930 with Dr Gale Journey  @  [facility] [address], [place], [place] [address] Phone: [phone]       ------------------------------------------------ Principal Problem:   Diabetic foot infection ([place]) Active Problems:   Diabetic ulcer of left foot associated with type 2 diabetes mellitus ([place])   History of transmetatarsal amputation of left foot ([place])   Major depressive disorder, recurrent episode, in remission with seasonal pattern ([place])   Tobacco abuse   Type 2 diabetes mellitus with diabetic neuropathy, with long-term current use of insulin ([place])   Cellulitis   AKI (acute kidney injury) ([place])   Mood disorder ([place])    HPI: Charles Thompson is a 60 y.o. male  pmh dm2, htn/hlp, former smoker, recurrent diabetic foot infection, here for same  Chart reviewed Hx discussed with patient  He still smokes until just prior to admission  He said he was feeling "sick" so came in, in setting of chronic left midfoot plantar ulcer.  He is rather non-descript about the ulcer history, but reports slight redness a few days prior to admission. Did reports subjective chill, weakness and nausea prompting him to seek care sooner than waiting for podiatry visit  He has had prior amputations - L TMA and R big toe in maybe 2016 or 2017.    +chills, sweats, unsure if he has had fever.     Slight initial leukocytosis but no fever or disturbed hemodynamics Mri as below Cr slightly up from baseline 1.4 bsAbx started Superficial cx staph aureus/proteus mirabilis Bcx also with proteus species  He is feeling better today  He is not considering higher amputation at this time     Family History  Problem Relation Age of Onset   Diabetes Other     Social History   Tobacco Use   Smoking status: Every Day    Packs/day: 1.00    Types: Cigarettes   Smokeless tobacco: Never  Vaping Use   Vaping Use: Never used  Substance Use Topics   Alcohol use: Yes    Alcohol/week: 0.0 standard drinks    Comment: occ   Drug use: Not Currently    Types: Marijuana  Comment: "not lately"    No Known Allergies  Review of Systems: ROS All Other ROS was negative, except mentioned above   Past Medical History:  Diagnosis Date   Anxiety    Arthritis    neck   CKD (chronic kidney disease), stage II    Diabetic peripheral neuropathy (HCC)    Diabetic ulcer of left foot (HCC)    First degree heart block    GERD (gastroesophageal reflux disease)    History of cellulitis 09/2020   left lower leg   History of osteomyelitis    bilateral feet   Hyperlipidemia    Hypertension    followed by pcp  (12-14--2021 per pt never had a stress test)   MDD (major depressive  disorder)    Type 2 diabetes mellitus with insulin therapy (HCC)    followed by pcp   (01-17-2021  pt stated checks blood sugar 6 times weekly in am,  fasting sugar--- 120)       Scheduled Meds:  amLODipine  10 mg Oral Daily   aspirin EC  81 mg Oral Daily   atorvastatin  10 mg Oral Daily   Chlorhexidine Gluconate Cloth  6 each Topical Once   docusate sodium  100 mg Oral BID   enoxaparin (LOVENOX) injection  40 mg Subcutaneous Daily   feeding supplement  237 mL Oral BID BM   feeding supplement (GLUCERNA SHAKE)  237 mL Oral TID BM   insulin aspart  0-15 Units Subcutaneous TID WC   insulin aspart  0-5 Units Subcutaneous QHS   insulin glargine-yfgn  20 Units Subcutaneous BID   multivitamin with minerals  1 tablet Oral Daily   nicotine  21 mg Transdermal Daily   nutrition supplement (JUVEN)  1 packet Oral BID BM   sertraline  100 mg Oral BID   Continuous Infusions:  sodium chloride 75 mL/hr at 10/02/21 0318   cefTRIAXone (ROCEPHIN)  IV 2 g (10/02/21 1157)   dextrose 5 % and 0.9% NaCl     PRN Meds:.acetaminophen **OR** acetaminophen, bisacodyl, hydrALAZINE, HYDROcodone-acetaminophen, morphine injection, ondansetron **OR** ondansetron (ZOFRAN) IV, polyethylene glycol   OBJECTIVE: Blood pressure 134/76, pulse 76, temperature 98.3 F (36.8 C), resp. rate 20, height 6' (1.829 m), weight 99.8 kg, SpO2 100 %.  Physical Exam  General/constitutional: no distress, pleasant, conversant HEENT: Normocephalic, PER, Conj Clear, EOMI, Oropharynx clear Neck supple CV: rrr no mrg Lungs: clear to auscultation, normal respiratory effort Abd: Soft, Nontender Ext: no edema Skin: No Rash Neuro: nonfocal MSK: previous toes amputation left foot; see picture for callous at left heel and ulcer/purulence mid left foot; right foot s/p hallux amputation and has callous midfoot  11/17 feet picture:        Central line presence: no   Lab Results Lab Results  Component Value Date   WBC 9.1  10/01/2021   HGB 12.5 (L) 10/01/2021   HCT 36.3 (L) 10/01/2021   MCV 81.4 10/01/2021   PLT 261 10/01/2021    Lab Results  Component Value Date   CREATININE 0.83 10/02/2021   BUN 15 10/02/2021   NA 135 10/02/2021   K 3.7 10/02/2021   CL 105 10/02/2021   CO2 25 10/02/2021    Lab Results  Component Value Date   ALT 17 10/02/2021   AST 13 (L) 10/02/2021   ALKPHOS 79 10/02/2021   BILITOT 0.5 10/02/2021      Microbiology: Recent Results (from the past 240 hour(s))  Resp Panel by RT-PCR (Flu A&B, Covid)  Peripheral     Status: None   Collection Time: 09/30/21  1:27 AM   Specimen: Peripheral; Nasopharyngeal(NP) swabs in vial transport medium  Result Value Ref Range Status   SARS Coronavirus 2 by RT PCR NEGATIVE NEGATIVE Final    Comment: (NOTE) SARS-CoV-2 target nucleic acids are NOT DETECTED.  The SARS-CoV-2 RNA is generally detectable in upper respiratory specimens during the acute phase of infection. The lowest concentration of SARS-CoV-2 viral copies this assay can detect is 138 copies/mL. A negative result does not preclude SARS-Cov-2 infection and should not be used as the sole basis for treatment or other patient management decisions. A negative result may occur with  improper specimen collection/handling, submission of specimen other than nasopharyngeal swab, presence of viral mutation(s) within the areas targeted by this assay, and inadequate number of viral copies(<138 copies/mL). A negative result must be combined with clinical observations, patient history, and epidemiological information. The expected result is Negative.  Fact Sheet for Patients:  EntrepreneurPulse.com.au  Fact Sheet for Healthcare Providers:  IncredibleEmployment.be  This test is no t yet approved or cleared by the [place] FDA and  has been authorized for detection and/or diagnosis of SARS-CoV-2 by FDA under an Emergency Use Authorization (EUA). This  EUA will remain  in effect (meaning this test can be used) for the duration of the COVID-19 declaration under Section 564(b)(1) of the Act, 21 U.S.C.section 360bbb-3(b)(1), unless the authorization is terminated  or revoked sooner.       Influenza A by PCR NEGATIVE NEGATIVE Final   Influenza B by PCR NEGATIVE NEGATIVE Final    Comment: (NOTE) The Xpert Xpress SARS-CoV-2/FLU/RSV plus assay is intended as an aid in the diagnosis of influenza from Nasopharyngeal swab specimens and should not be used as a sole basis for treatment. Nasal washings and aspirates are unacceptable for Xpert Xpress SARS-CoV-2/FLU/RSV testing.  Fact Sheet for Patients: EntrepreneurPulse.com.au  Fact Sheet for Healthcare Providers: IncredibleEmployment.be  This test is not yet approved or cleared by the [place] FDA and has been authorized for detection and/or diagnosis of SARS-CoV-2 by FDA under an Emergency Use Authorization (EUA). This EUA will remain in effect (meaning this test can be used) for the duration of the COVID-19 declaration under Section 564(b)(1) of the Act, 21 U.S.C. section 360bbb-3(b)(1), unless the authorization is terminated or revoked.  Performed at [facility], [place] [address] [place], [place] [address]   Blood culture (routine x 2)     Status: None (Preliminary result)   Collection Time: 09/30/21  1:30 AM   Specimen: BLOOD LEFT HAND  Result Value Ref Range Status   Specimen Description   Final    BLOOD LEFT HAND Performed at [facility], [place] [address], [place], [place] [address]    Special Requests   Final    BOTTLES DRAWN AEROBIC AND ANAEROBIC Blood Culture adequate volume Performed at [facility] [address], [place], [place] [address]    Culture  Setup Time   Final    GRAM NEGATIVE RODS AEROBIC BOTTLE ONLY CRITICAL RESULT CALLED TO, READ BACK BY AND VERIFIED WITH: PHARMD ANH  PHAM [id number] FCP Performed at [facility], [place] [address] [place], [place] [address]    Culture GRAM NEGATIVE RODS  Final   Report Status PENDING  Incomplete  Blood culture (routine x 2)     Status: None (Preliminary result)   Collection Time: 09/30/21  1:30 AM   Specimen: BLOOD  Result Value Ref Range Status   Specimen Description   Final    BLOOD RIGHT ANTECUBITAL Performed at [facility], [address] [place], [place] [address]    Special Requests   Final    BOTTLES DRAWN AEROBIC AND ANAEROBIC Blood Culture adequate volume Performed at [facility], [address] [place], [place] [address]    Culture   Final    NO GROWTH 1 DAY Performed at [facility], [address] [place], [place] [address]    Report Status PENDING  Incomplete  Blood Culture ID Panel (Reflexed)     Status: Abnormal   Collection Time: 09/30/21  1:30 AM  Result Value Ref Range Status   Enterococcus faecalis NOT DETECTED NOT DETECTED Final   Enterococcus Faecium NOT DETECTED NOT DETECTED Final   Listeria monocytogenes NOT DETECTED NOT DETECTED Final   Staphylococcus species NOT DETECTED NOT DETECTED Final   Staphylococcus aureus (BCID) NOT DETECTED NOT DETECTED Final   Staphylococcus epidermidis NOT DETECTED NOT DETECTED Final   Staphylococcus lugdunensis NOT DETECTED NOT DETECTED Final   Streptococcus species NOT DETECTED NOT DETECTED Final   Streptococcus agalactiae NOT DETECTED NOT DETECTED Final   Streptococcus pneumoniae NOT DETECTED NOT DETECTED Final   Streptococcus pyogenes NOT DETECTED NOT DETECTED Final   A.calcoaceticus-baumannii NOT DETECTED NOT DETECTED Final   Bacteroides fragilis NOT DETECTED NOT DETECTED Final   Enterobacterales DETECTED (A) NOT DETECTED Final    Comment: Enterobacterales represent a large order of gram negative bacteria, not a single organism. CRITICAL RESULT CALLED TO, READ BACK BY AND VERIFIED WITH: PHARMD  ANH PHAM 1032 [id number] FCP    Enterobacter cloacae complex NOT DETECTED NOT DETECTED Final   Escherichia coli NOT DETECTED NOT DETECTED Final   Klebsiella aerogenes NOT DETECTED NOT DETECTED Final   Klebsiella oxytoca NOT DETECTED NOT DETECTED Final   Klebsiella pneumoniae NOT DETECTED NOT DETECTED Final   Proteus species DETECTED (A) NOT DETECTED Final    Comment: CRITICAL RESULT CALLED TO, READ BACK BY AND VERIFIED WITH: PHARMD ANH PHAM 1032 [id number] FCP    Salmonella species NOT DETECTED NOT DETECTED Final   Serratia marcescens NOT DETECTED NOT DETECTED Final   Haemophilus influenzae NOT DETECTED NOT DETECTED Final   Neisseria meningitidis NOT DETECTED NOT DETECTED Final   Pseudomonas aeruginosa NOT DETECTED NOT DETECTED Final   Stenotrophomonas maltophilia NOT DETECTED NOT DETECTED Final   Candida albicans NOT DETECTED NOT DETECTED Final   Candida auris NOT DETECTED NOT DETECTED Final   Candida glabrata NOT DETECTED NOT DETECTED Final   Candida krusei NOT DETECTED NOT DETECTED Final   Candida parapsilosis NOT DETECTED NOT DETECTED Final   Candida tropicalis NOT DETECTED NOT DETECTED Final   Cryptococcus neoformans/gattii NOT DETECTED NOT DETECTED Final   CTX-M ESBL NOT DETECTED NOT DETECTED Final   Carbapenem resistance IMP NOT DETECTED NOT DETECTED Final   Carbapenem resistance KPC NOT DETECTED NOT DETECTED Final   Carbapenem resistance NDM NOT DETECTED NOT DETECTED Final   Carbapenem resist OXA 48 LIKE NOT DETECTED NOT DETECTED Final   Carbapenem resistance VIM NOT DETECTED NOT DETECTED Final    Comment: Performed at [facility], [address] [place], [place] [address]  Aerobic/Anaerobic Culture w Gram Stain (surgical/deep wound)     Status: None (Preliminary result)   Collection Time: 09/30/21  2:34 AM   Specimen: Foot; Wound  Result Value Ref Range Status   Specimen Description   Final  FOOT LEFT Performed at [facility], [place] [address] [place],  [place] [address]    Special Requests   Final    NONE Performed at [facility], [place] [address], [place], [place] [address]    Gram Stain   Final    FEW WBC PRESENT,BOTH PMN AND MONONUCLEAR FEW GRAM POSITIVE COCCI IN PAIRS    Culture   Final    FEW PROTEUS MIRABILIS FEW STAPHYLOCOCCUS AUREUS CULTURE REINCUBATED FOR BETTER GROWTH Performed at [facility], [place] [address] [place], [place] [address]    Report Status PENDING  Incomplete   Organism ID, Bacteria PROTEUS MIRABILIS  Final      Susceptibility   Proteus mirabilis - MIC*    AMPICILLIN <=2 SENSITIVE Sensitive     CEFAZOLIN <=4 SENSITIVE Sensitive     CEFEPIME <=0.12 SENSITIVE Sensitive     CEFTAZIDIME <=1 SENSITIVE Sensitive     CEFTRIAXONE <=0.25 SENSITIVE Sensitive     CIPROFLOXACIN <=0.25 SENSITIVE Sensitive     GENTAMICIN <=1 SENSITIVE Sensitive     IMIPENEM 2 SENSITIVE Sensitive     TRIMETH/SULFA <=20 SENSITIVE Sensitive     AMPICILLIN/SULBACTAM <=2 SENSITIVE Sensitive     PIP/TAZO <=4 SENSITIVE Sensitive     * FEW PROTEUS MIRABILIS     Serology:    Imaging: If present, new imagings (plain films, ct scans, and mri) have been personally visualized and interpreted; radiology reports have been reviewed. Decision making incorporated into the Impression / Recommendations.  11/15 mri left foot wwo contrast Osteomyelitis of the medial cuneiform. Probable early osteomyelitis of the base of the second metatarsal and medial aspect of the middle cuneiform. Adjacent plantar medial soft tissue ulcer, with continuous extension to the plantar surface of the medial cuneiform and is likely draining, correlate with exam. No other nondraining rim enhancing abscess identified.   Diffuse soft tissue swelling and enhancement consistent with cellulitis.   11/15 abi Right: Resting right ankle-brachial index is within normal range. No  evidence of significant right lower extremity arterial disease.   Unable  to obtain TBI due to great toe amputation.  Left: Resting left ankle-brachial index is within normal range. No  evidence of significant left lower extremity arterial disease.   Jabier Mutton, [place] [facility] [place] [phone] pager    10/02/2021, 1:16 PM

## 2021-10-02 NOTE — Anesthesia Postprocedure Evaluation (Signed)
Anesthesia Post Note  Patient: Charles Thompson  Procedure(s) Performed: DEBRIDEMENT WOUND (Left)     Patient location during evaluation: PACU Anesthesia Type: MAC Level of consciousness: awake and alert Pain management: pain level controlled Vital Signs Assessment: post-procedure vital signs reviewed and stable Respiratory status: spontaneous breathing, nonlabored ventilation and respiratory function stable Cardiovascular status: blood pressure returned to baseline and stable Postop Assessment: no apparent nausea or vomiting Anesthetic complications: no   No notable events documented.  Last Vitals:  Vitals:   10/02/21 1300 10/02/21 1722  BP: 139/64 136/73  Pulse: 87 67  Resp: 20 17  Temp: 37.1 C 36.7 C  SpO2: 100% 99%    Last Pain:  Vitals:   10/02/21 1722  TempSrc: Oral  PainSc: 0-No pain                 Pervis Hocking

## 2021-10-02 NOTE — Transfer of Care (Signed)
Immediate Anesthesia Transfer of Care Note  Patient: Charles Thompson  Procedure(s) Performed: DEBRIDEMENT WOUND (Left)  Patient Location: PACU  Anesthesia Type:MAC  Level of Consciousness: awake and alert   Airway & Oxygen Therapy: Patient Spontanous Breathing and Patient connected to face mask oxygen  Post-op Assessment: Report given to RN and Post -op Vital signs reviewed and stable  Post vital signs: Reviewed and stable  Last Vitals:  Vitals Value Taken Time  BP 116/67 10/02/21 1920  Temp    Pulse 73 10/02/21 1925  Resp 20 10/02/21 1925  SpO2 100 % 10/02/21 1925  Vitals shown include unvalidated device data.  Last Pain:  Vitals:   10/02/21 1722  TempSrc: Oral  PainSc: 0-No pain      Patients Stated Pain Goal: 0 (01/74/94 4967)  Complications: No notable events documented.

## 2021-10-02 NOTE — Anesthesia Procedure Notes (Signed)
Procedure Name: MAC Date/Time: 10/02/2021 6:32 PM Performed by: Cynda Familia, CRNA Pre-anesthesia Checklist: Patient identified, Emergency Drugs available, Suction available, Patient being monitored and Timeout performed Patient Re-evaluated:Patient Re-evaluated prior to induction Oxygen Delivery Method: Simple face mask Placement Confirmation: positive ETCO2 and breath sounds checked- equal and bilateral Dental Injury: Teeth and Oropharynx as per pre-operative assessment

## 2021-10-02 NOTE — Brief Op Note (Signed)
10/02/2021  7:39 PM  PATIENT:  Charles Thompson  60 y.o. male  PRE-OPERATIVE DIAGNOSIS:  osteomyelitis, ulcer  POST-OPERATIVE DIAGNOSIS:  osteomyelitis, ulcer  PROCEDURE:  Left foot ulcer debridement/excision, bone biopsy.   SURGEON:  Surgeon(s) and Role:    * Imer Foxworth, Lesia Sago, DPM - Primary  PHYSICIAN ASSISTANT:   ASSISTANTS: none   ANESTHESIA:   general  EBL:  1 mL   BLOOD ADMINISTERED:none  DRAINS: none   LOCAL MEDICATIONS USED:  OTHER 20 cc lidocaine and marcaine plain  SPECIMEN:  Source of Specimen:  wound culture, bone culture  DISPOSITION OF SPECIMEN:  PATHOLOGY  COUNTS:  YES  TOURNIQUET:  * Missing tourniquet times found for documented tourniquets in log: [id number] *  DICTATION: .Reubin Milan Dictation  PLAN OF CARE: Admit to inpatient   PATIENT DISPOSITION:  PACU - hemodynamically stable.   Delay start of Pharmacological VTE agent (>24hrs) due to surgical blood loss or risk of bleeding: no  Intraoperative findings: Ulcer on plantar foot with purulence, wound excised. Bone biopsy preformed. Packed plantar foot with saline wet to dry. Dressing applied. Await culture results for antibiotic recommendations.

## 2021-10-02 NOTE — Progress Notes (Signed)
Pt refused wound care on R foot and reported MD will perform first dressing change on L foot.

## 2021-10-02 NOTE — Progress Notes (Signed)
PROGRESS NOTE    Charles Thompson  [id number] DOB: August 02, 1961 DOA: 09/30/2021 PCP: Haywood Pao, MD    Brief Narrative:  60 y.o. male with medical history significant of DM; HTN; HLD: and depression presenting with a diabetic foot ulcer. He quit smoking 8-9 days ago and was thinking he was having issues with that.  He was vomiting with eating, got so weak he couldn't take it anymore.  He had an appointment with his foot doctor this AM but couldn't wait.  He had antibiotics at home but couldn't find them.  The foot started turning red a few days ago.  He feels better after the abx and fluid and he is so sleepy. He has had prior amputations - L TMA and R big toe in maybe 2016 or 2017.  +chills, sweats, unsure if he has had fever.  Assessment & Plan:   Principal Problem:   Diabetic foot infection ([place]) Active Problems:   Diabetic ulcer of left foot associated with type 2 diabetes mellitus ([place])   History of transmetatarsal amputation of left foot ([facility])   Major depressive disorder, recurrent episode, in remission with seasonal pattern ([place])   Tobacco abuse   Type 2 diabetes mellitus with diabetic neuropathy, with long-term current use of insulin ([place])   Cellulitis   Severe sepsis without septic shock ([place])   AKI (acute kidney injury) ([place])   Bacteremia   Mood disorder ([place])  Diabetic foot infection with proteus bacteremia -Patient is s/p L TMA and has a chronic L midfoot ulcer that is followed by podiatry -Foot ulcers are present along the plantar L foot and now the patient has developed surrounding cellulitis -Xray negative for deep infection but MRI shows osteo -Negative lactate, no current concerns for sepsis -Pt was initially started on IV antibiotics (Rocephin/Flagyl/Vancomycin), later narrowed to rocephin -Dr. Sharol Given was initially consulted per admitting physician. Discussed with Orthopedic Surgery, who strongly recommends BKA -Podiatry was consulted who also agreed with  BKA -Despite recommendations, patient adamantly refused amputation.  -Pt is planned for debridement this afternoon -Blood cx have returned pos for Proteus. Appreciate recs by ID -ID recs for cont current abx. If pt remains adamant to go home post-op before final cultures, recs for doxy 100mg  BID and augmentin 875mg  bid with ID f/u in 1 week. Likely will require 6 weeks abx   AKI -Likely related to acute infection -Hold Zestoretic for now -Renal function improved -Cont to avoid nephrotoxic agents -Repeat bmet in AM   DM -Will check A1c -hold Synjardy while in hospital -Toujeo ordered but held due to low glucose today -Cont with moderate-scale SSI while in hospital   HTN -Continue Norvasc -Hold Zestoretic for now   HLD -Continue Lipitor   Mood d/o -Continue Zoloft, Klonopin -Still adamantly refusing amputation, however agreeable to debridement this afternoon   Tobacco dependence -He quit 8-9 days ago -Ongoing cessation efforts are strongly encouraged. -Patch ordered at patient request.     DVT prophylaxis: Lovenox subq Code Status: Full Family Communication: Pt in room, family not at bedside  Status is: Inpatient  Remains inpatient appropriate because: Severity of illness   Consultants:  Orthopedic Surgery Podiatry  Procedures:    Antimicrobials: Anti-infectives (From admission, onward)    Start     Dose/Rate Route Frequency Ordered Stop   09/30/21 1500  vancomycin (VANCOCIN) IVPB 1000 mg/200 mL premix  Status:  Discontinued        1,000 mg 200 mL/hr over 60 Minutes Intravenous Every 12 hours  09/30/21 1026 10/02/21 1140   09/30/21 1000  cefTRIAXone (ROCEPHIN) 2 g in sodium chloride 0.9 % 100 mL IVPB        2 g 200 mL/hr over 30 Minutes Intravenous Every 24 hours 09/30/21 0908 10/07/21 0959   09/30/21 1000  metroNIDAZOLE (FLAGYL) IVPB 500 mg  Status:  Discontinued        500 mg 100 mL/hr over 60 Minutes Intravenous Every 12 hours 09/30/21 0908 10/02/21 1140    09/30/21 0245  vancomycin (VANCOCIN) IVPB 1000 mg/200 mL premix        1,000 mg 200 mL/hr over 60 Minutes Intravenous  Once 09/30/21 0238 09/30/21 0442       Subjective: Still wanting to go home  Objective: Vitals:   10/01/21 1149 10/01/21 2052 10/02/21 0452 10/02/21 1300  BP:  (!) 143/90 134/76 139/64  Pulse:  81 76 87  Resp:  20 20 20   Temp: 98.2 F (36.8 C) 99.2 F (37.3 C) 98.3 F (36.8 C) 98.7 F (37.1 C)  TempSrc: Oral     SpO2:  99% 100% 100%  Weight:      Height:       No intake or output data in the 24 hours ending 10/02/21 1552  Filed Weights   09/30/21 0934  Weight: 99.8 kg    Examination: General exam: Conversant, in no acute distress Respiratory system: normal chest rise, clear, no audible wheezing Cardiovascular system: regular rhythm, s1-s2 Gastrointestinal system: Nondistended, nontender, pos BS Central nervous system: No seizures, no tremors Extremities: No cyanosis, multiple LE amputations B Skin: No rashes, no pallor Psychiatry: Affect normal // no auditory hallucinations   Data Reviewed: I have personally reviewed following labs and imaging studies  CBC: Recent Labs  Lab 09/30/21 0130 10/01/21 0504  WBC 13.8* 9.1  NEUTROABS 10.9*  --   HGB 14.5 12.5*  HCT 41.7 36.3*  MCV 80.8 81.4  PLT 309 261    Basic Metabolic Panel: Recent Labs  Lab 09/30/21 0130 10/01/21 0504 10/02/21 0450  NA 128* 133* 135  K 3.4* 2.9* 3.7  CL 91* 99 105  CO2 21* 23 25  GLUCOSE 149* 104* 136*  BUN 28* 18 15  CREATININE 1.41* 0.78 0.83  CALCIUM 8.8* 8.5* 8.3*    GFR: Estimated Creatinine Clearance: 114.3 mL/min (by C-G formula based on SCr of 0.83 mg/dL). Liver Function Tests: Recent Labs  Lab 09/30/21 0130 10/02/21 0450  AST 17 13*  ALT 23 17  ALKPHOS 95 79  BILITOT 1.1 0.5  PROT 7.8 6.7  ALBUMIN 3.6 3.0*    No results for input(s): LIPASE, AMYLASE in the last 168 hours. No results for input(s): AMMONIA in the last 168  hours. Coagulation Profile: No results for input(s): INR, PROTIME in the last 168 hours. Cardiac Enzymes: No results for input(s): CKTOTAL, CKMB, CKMBINDEX, TROPONINI in the last 168 hours. BNP (last 3 results) No results for input(s): PROBNP in the last 8760 hours. HbA1C: Recent Labs    09/30/21 0941  HGBA1C 6.3*    CBG: Recent Labs  Lab 10/01/21 1140 10/01/21 1715 10/01/21 2053 10/02/21 0807 10/02/21 1230  GLUCAP 124* 162* 189* 80 91    Lipid Profile: No results for input(s): CHOL, HDL, LDLCALC, TRIG, CHOLHDL, LDLDIRECT in the last 72 hours. Thyroid Function Tests: No results for input(s): TSH, T4TOTAL, FREET4, T3FREE, THYROIDAB in the last 72 hours. Anemia Panel: No results for input(s): VITAMINB12, FOLATE, FERRITIN, TIBC, IRON, RETICCTPCT in the last 72 hours. Sepsis Labs: Recent Labs  Lab 09/30/21 0110  LATICACIDVEN 1.5     Recent Results (from the past 240 hour(s))  Resp Panel by RT-PCR (Flu A&B, Covid) Peripheral     Status: None   Collection Time: 09/30/21  1:27 AM   Specimen: Peripheral; Nasopharyngeal(NP) swabs in vial transport medium  Result Value Ref Range Status   SARS Coronavirus 2 by RT PCR NEGATIVE NEGATIVE Final    Comment: (NOTE) SARS-CoV-2 target nucleic acids are NOT DETECTED.  The SARS-CoV-2 RNA is generally detectable in upper respiratory specimens during the acute phase of infection. The lowest concentration of SARS-CoV-2 viral copies this assay can detect is 138 copies/mL. A negative result does not preclude SARS-Cov-2 infection and should not be used as the sole basis for treatment or other patient management decisions. A negative result may occur with  improper specimen collection/handling, submission of specimen other than nasopharyngeal swab, presence of viral mutation(s) within the areas targeted by this assay, and inadequate number of viral copies(<138 copies/mL). A negative result must be combined with clinical observations,  patient history, and epidemiological information. The expected result is Negative.  Fact Sheet for Patients:  EntrepreneurPulse.com.au  Fact Sheet for Healthcare Providers:  IncredibleEmployment.be  This test is no t yet approved or cleared by the [place] FDA and  has been authorized for detection and/or diagnosis of SARS-CoV-2 by FDA under an Emergency Use Authorization (EUA). This EUA will remain  in effect (meaning this test can be used) for the duration of the COVID-19 declaration under Section 564(b)(1) of the Act, 21 U.S.C.section 360bbb-3(b)(1), unless the authorization is terminated  or revoked sooner.       Influenza A by PCR NEGATIVE NEGATIVE Final   Influenza B by PCR NEGATIVE NEGATIVE Final    Comment: (NOTE) The Xpert Xpress SARS-CoV-2/FLU/RSV plus assay is intended as an aid in the diagnosis of influenza from Nasopharyngeal swab specimens and should not be used as a sole basis for treatment. Nasal washings and aspirates are unacceptable for Xpert Xpress SARS-CoV-2/FLU/RSV testing.  Fact Sheet for Patients: EntrepreneurPulse.com.au  Fact Sheet for Healthcare Providers: IncredibleEmployment.be  This test is not yet approved or cleared by the [place] FDA and has been authorized for detection and/or diagnosis of SARS-CoV-2 by FDA under an Emergency Use Authorization (EUA). This EUA will remain in effect (meaning this test can be used) for the duration of the COVID-19 declaration under Section 564(b)(1) of the Act, 21 U.S.C. section 360bbb-3(b)(1), unless the authorization is terminated or revoked.  Performed at [facility], [place] [address] [place], [place] [address]   Blood culture (routine x 2)     Status: None (Preliminary result)   Collection Time: 09/30/21  1:30 AM   Specimen: BLOOD LEFT HAND  Result Value Ref Range Status   Specimen Description   Final     BLOOD LEFT HAND Performed at [facility], [place] [address] [place], [place] [address]    Special Requests   Final    BOTTLES DRAWN AEROBIC AND ANAEROBIC Blood Culture adequate volume Performed at [place] [address], [place], [place] [address]    Culture  Setup Time   Final    GRAM NEGATIVE RODS AEROBIC BOTTLE ONLY CRITICAL RESULT CALLED TO, READ BACK BY AND VERIFIED WITH: PHARMD ANH PHAM 1032 [id number] FCP Performed at [facility], [place] [address], [place],  [address]    Culture GRAM NEGATIVE RODS  Final   Report Status PENDING  Incomplete  Blood culture (routine x 2)     Status: None (Preliminary result)   Collection Time: 09/30/21  1:30 AM   Specimen: BLOOD  Result Value Ref Range Status   Specimen Description   Final    BLOOD RIGHT ANTECUBITAL Performed at [place] [address], [place], [place] [address]    Special Requests   Final    BOTTLES DRAWN AEROBIC AND ANAEROBIC Blood Culture adequate volume Performed at [place] [address], [place], [place] [address]    Culture   Final    NO GROWTH 2 DAYS Performed at [place] [address], [place], [place] [address]    Report Status PENDING  Incomplete  Blood Culture ID Panel (Reflexed)     Status: Abnormal   Collection Time: 09/30/21  1:30 AM  Result Value Ref Range Status   Enterococcus faecalis NOT DETECTED NOT DETECTED Final   Enterococcus Faecium NOT DETECTED NOT DETECTED Final   Listeria monocytogenes NOT DETECTED NOT DETECTED Final   Staphylococcus species NOT DETECTED NOT DETECTED Final   Staphylococcus aureus (BCID) NOT DETECTED NOT DETECTED Final   Staphylococcus epidermidis NOT DETECTED NOT DETECTED Final   Staphylococcus lugdunensis NOT DETECTED NOT DETECTED Final   Streptococcus species NOT DETECTED NOT DETECTED Final   Streptococcus agalactiae NOT DETECTED NOT DETECTED Final   Streptococcus  pneumoniae NOT DETECTED NOT DETECTED Final   Streptococcus pyogenes NOT DETECTED NOT DETECTED Final   A.calcoaceticus-baumannii NOT DETECTED NOT DETECTED Final   Bacteroides fragilis NOT DETECTED NOT DETECTED Final   Enterobacterales DETECTED (A) NOT DETECTED Final    Comment: Enterobacterales represent a large order of gram negative bacteria, not a single organism. CRITICAL RESULT CALLED TO, READ BACK BY AND VERIFIED WITH: PHARMD ANH PHAM 1032 [id number] FCP    Enterobacter cloacae complex NOT DETECTED NOT DETECTED Final   Escherichia coli NOT DETECTED NOT DETECTED Final   Klebsiella aerogenes NOT DETECTED NOT DETECTED Final   Klebsiella oxytoca NOT DETECTED NOT DETECTED Final   Klebsiella pneumoniae NOT DETECTED NOT DETECTED Final   Proteus species DETECTED (A) NOT DETECTED Final    Comment: CRITICAL RESULT CALLED TO, READ BACK BY AND VERIFIED WITH: PHARMD ANH PHAM 1032 [id number] FCP    Salmonella species NOT DETECTED NOT DETECTED Final   Serratia marcescens NOT DETECTED NOT DETECTED Final   Haemophilus influenzae NOT DETECTED NOT DETECTED Final   Neisseria meningitidis NOT DETECTED NOT DETECTED Final   Pseudomonas aeruginosa NOT DETECTED NOT DETECTED Final   Stenotrophomonas maltophilia NOT DETECTED NOT DETECTED Final   Candida albicans NOT DETECTED NOT DETECTED Final   Candida auris NOT DETECTED NOT DETECTED Final   Candida glabrata NOT DETECTED NOT DETECTED Final   Candida krusei NOT DETECTED NOT DETECTED Final   Candida parapsilosis NOT DETECTED NOT DETECTED Final   Candida tropicalis NOT DETECTED NOT DETECTED Final   Cryptococcus neoformans/gattii NOT DETECTED NOT DETECTED Final   CTX-M ESBL NOT DETECTED NOT DETECTED Final   Carbapenem resistance IMP NOT DETECTED NOT DETECTED Final   Carbapenem resistance KPC NOT DETECTED NOT DETECTED Final   Carbapenem resistance NDM NOT DETECTED NOT DETECTED Final   Carbapenem resist OXA 48 LIKE NOT DETECTED NOT DETECTED Final   Carbapenem  resistance VIM NOT DETECTED NOT DETECTED Final    Comment: Performed at [facility], [address] [place], [place] [address]  Aerobic/Anaerobic Culture w Gram Stain (surgical/deep wound)     Status: None (Preliminary result)  Collection Time: 09/30/21  2:34 AM   Specimen: Foot; Wound  Result Value Ref Range Status   Specimen Description   Final    FOOT LEFT Performed at [facility], [place] [address], [place], [place] [address]    Special Requests   Final    NONE Performed at [facility], [place] [address], [place],  [address]    Gram Stain   Final    FEW WBC PRESENT,BOTH PMN AND MONONUCLEAR FEW GRAM POSITIVE COCCI IN PAIRS    Culture   Final    FEW PROTEUS MIRABILIS FEW STAPHYLOCOCCUS AUREUS CULTURE REINCUBATED FOR BETTER GROWTH Performed at [facility], [place] [address], [place],  [address]    Report Status PENDING  Incomplete   Organism ID, Bacteria PROTEUS MIRABILIS  Final      Susceptibility   Proteus mirabilis - MIC*    AMPICILLIN <=2 SENSITIVE Sensitive     CEFAZOLIN <=4 SENSITIVE Sensitive     CEFEPIME <=0.12 SENSITIVE Sensitive     CEFTAZIDIME <=1 SENSITIVE Sensitive     CEFTRIAXONE <=0.25 SENSITIVE Sensitive     CIPROFLOXACIN <=0.25 SENSITIVE Sensitive     GENTAMICIN <=1 SENSITIVE Sensitive     IMIPENEM 2 SENSITIVE Sensitive     TRIMETH/SULFA <=20 SENSITIVE Sensitive     AMPICILLIN/SULBACTAM <=2 SENSITIVE Sensitive     PIP/TAZO <=4 SENSITIVE Sensitive     * FEW PROTEUS MIRABILIS      Radiology Studies: No results found.  Scheduled Meds:  amLODipine  10 mg Oral Daily   aspirin EC  81 mg Oral Daily   atorvastatin  10 mg Oral Daily   Chlorhexidine Gluconate Cloth  6 each Topical Once   docusate sodium  100 mg Oral BID   enoxaparin (LOVENOX) injection  40 mg Subcutaneous Daily   feeding supplement  237 mL Oral BID BM   feeding supplement (GLUCERNA SHAKE)  237 mL Oral TID BM   insulin aspart  0-15 Units  Subcutaneous TID WC   insulin aspart  0-5 Units Subcutaneous QHS   insulin glargine-yfgn  20 Units Subcutaneous BID   multivitamin with minerals  1 tablet Oral Daily   nicotine  21 mg Transdermal Daily   nutrition supplement (JUVEN)  1 packet Oral BID BM   sertraline  100 mg Oral BID   Continuous Infusions:  sodium chloride 75 mL/hr at 10/02/21 0318   cefTRIAXone (ROCEPHIN)  IV 2 g (10/02/21 1157)   dextrose 5 % and 0.9% NaCl 75 mL/hr at 10/02/21 1406     LOS: 2 days   Marylu Lund, MD [facility] Pager On Amion  If 7PM-7AM, please contact night-coverage 10/02/2021, 3:52 PM

## 2021-10-02 NOTE — Anesthesia Preprocedure Evaluation (Addendum)
Anesthesia Evaluation  Patient identified by MRN, date of birth, ID band Patient awake    Reviewed: Allergy & Precautions, NPO status , Patient's Chart, lab work & pertinent test results  Airway Mallampati: I  TM Distance: >3 FB Neck ROM: Full    Dental  (+) Teeth Intact, Dental Advisory Given   Pulmonary Current Smoker,  Quit smoking 2 weeks, 3/4ppd x 50 years  No inhalers   Pulmonary exam normal breath sounds clear to auscultation       Cardiovascular hypertension (136/73 in preop), Pt. on medications Normal cardiovascular exam+ dysrhythmias (1st degree AVB)  Rhythm:Regular Rate:Normal     Neuro/Psych PSYCHIATRIC DISORDERS Anxiety Depression  Neuromuscular disease (diabetic peripheral neuropathy)    GI/Hepatic Neg liver ROS, GERD  Medicated and Controlled,  Endo/Other  diabetes, Poorly Controlled, Type 2, Insulin Dependenta1c 6.3 FS 80 in preop- starts to feel hypoglycemic around 70   Renal/GU Renal InsufficiencyRenal diseaseHx CKD 2, Cr 0.83     Musculoskeletal  (+) Arthritis , Osteoarthritis,  Osteo, L diabetic foot ulcer   Abdominal   Peds  Hematology negative hematology ROS (+) hct 36.3, plt 261   Anesthesia Other Findings   Reproductive/Obstetrics                           Anesthesia Physical Anesthesia Plan  ASA: 3  Anesthesia Plan: MAC   Post-op Pain Management:    Induction:   PONV Risk Score and Plan: 2 and Propofol infusion and TIVA  Airway Management Planned: Natural Airway and Simple Face Mask  Additional Equipment: None  Intra-op Plan:   Post-operative Plan:   Informed Consent: I have reviewed the patients History and Physical, chart, labs and discussed the procedure including the risks, benefits and alternatives for the proposed anesthesia with the patient or authorized representative who has indicated his/her understanding and acceptance.     Dental advisory  given  Plan Discussed with: CRNA  Anesthesia Plan Comments:        Anesthesia Quick Evaluation

## 2021-10-02 NOTE — Progress Notes (Signed)
PHARMACY - PHYSICIAN COMMUNICATION CRITICAL VALUE ALERT - BLOOD CULTURE IDENTIFICATION (BCID)  Charles Thompson is an 60 y.o. male who presented to [facility] on 09/30/2021 with a chief complaint of  Chief Complaint  Patient presents with   Wound Infection     Assessment:  wound inf (include suspected source if known)  Name of physician (or Provider) Contacted: Dr. Rhona Leavens   Current antibiotics: Vancomycin, Flagyl, Ceftriaxone  Changes to prescribed antibiotics recommended:  - [place] to Ceftriaxone only   Results for orders placed or performed during the hospital encounter of 09/30/21  Blood Culture ID Panel (Reflexed) (Collected: 09/30/2021  1:30 AM)  Result Value Ref Range   Enterococcus faecalis NOT DETECTED NOT DETECTED   Enterococcus Faecium NOT DETECTED NOT DETECTED   Listeria monocytogenes NOT DETECTED NOT DETECTED   Staphylococcus species NOT DETECTED NOT DETECTED   Staphylococcus aureus (BCID) NOT DETECTED NOT DETECTED   Staphylococcus epidermidis NOT DETECTED NOT DETECTED   Staphylococcus lugdunensis NOT DETECTED NOT DETECTED   Streptococcus species NOT DETECTED NOT DETECTED   Streptococcus agalactiae NOT DETECTED NOT DETECTED   Streptococcus pneumoniae NOT DETECTED NOT DETECTED   Streptococcus pyogenes NOT DETECTED NOT DETECTED   A.calcoaceticus-baumannii NOT DETECTED NOT DETECTED   Bacteroides fragilis NOT DETECTED NOT DETECTED   Enterobacterales DETECTED (A) NOT DETECTED   Enterobacter cloacae complex NOT DETECTED NOT DETECTED   Escherichia coli NOT DETECTED NOT DETECTED   Klebsiella aerogenes NOT DETECTED NOT DETECTED   Klebsiella oxytoca NOT DETECTED NOT DETECTED   Klebsiella pneumoniae NOT DETECTED NOT DETECTED   Proteus species DETECTED (A) NOT DETECTED   Salmonella species NOT DETECTED NOT DETECTED   Serratia marcescens NOT DETECTED NOT DETECTED   Haemophilus influenzae NOT DETECTED NOT DETECTED   Neisseria meningitidis NOT DETECTED NOT DETECTED    Pseudomonas aeruginosa NOT DETECTED NOT DETECTED   Stenotrophomonas maltophilia NOT DETECTED NOT DETECTED   Candida albicans NOT DETECTED NOT DETECTED   Candida auris NOT DETECTED NOT DETECTED   Candida glabrata NOT DETECTED NOT DETECTED   Candida krusei NOT DETECTED NOT DETECTED   Candida parapsilosis NOT DETECTED NOT DETECTED   Candida tropicalis NOT DETECTED NOT DETECTED   Cryptococcus neoformans/gattii NOT DETECTED NOT DETECTED   CTX-M ESBL NOT DETECTED NOT DETECTED   Carbapenem resistance IMP NOT DETECTED NOT DETECTED   Carbapenem resistance KPC NOT DETECTED NOT DETECTED   Carbapenem resistance NDM NOT DETECTED NOT DETECTED   Carbapenem resist OXA 48 LIKE NOT DETECTED NOT DETECTED   Carbapenem resistance VIM NOT DETECTED NOT DETECTED     Adalberto Cole, PharmD, BCPS 10/02/2021 11:40 AM

## 2021-10-02 NOTE — Progress Notes (Signed)
Patient seen in pre-op. Scheduled for left foot ulcer debridement, bone biopsy. He has declined amputation. We again discussed the surgery and post-op course. All alternatives, risks, complications discussed. No promises or guarantees given. Consent signed.   Discussed that I would like to wait for discharge until the cultures come back but states he is not going to stay that long.   Ovid Curd, DPM

## 2021-10-03 ENCOUNTER — Ambulatory Visit: Payer: Medicare Other | Admitting: Podiatry

## 2021-10-03 ENCOUNTER — Encounter (HOSPITAL_COMMUNITY): Payer: Self-pay | Admitting: Podiatry

## 2021-10-03 ENCOUNTER — Telehealth: Payer: Self-pay | Admitting: Podiatry

## 2021-10-03 LAB — GLUCOSE, CAPILLARY
Glucose-Capillary: 150 mg/dL — ABNORMAL HIGH (ref 70–99)
Glucose-Capillary: 177 mg/dL — ABNORMAL HIGH (ref 70–99)

## 2021-10-03 MED ORDER — DOXYCYCLINE HYCLATE 50 MG PO CAPS: 100.0000 mg | ORAL_CAPSULE | Freq: Two times a day (BID) | ORAL | 0 refills | Status: AC

## 2021-10-03 MED ORDER — AMOXICILLIN-POT CLAVULANATE 875-125 MG PO TABS: 1.0000 | ORAL_TABLET | Freq: Two times a day (BID) | ORAL | 0 refills | Status: AC

## 2021-10-03 NOTE — Telephone Encounter (Signed)
Patient called the office concerned stating Dr.wagoner told him that he would come by the hospital to come look at his foot before he went home. Patient stated if Dr.wagoner is not coming please let him know so he can be discharge home.     Please advise .Marland KitchenMarland KitchenMarland Kitchen

## 2021-10-03 NOTE — Discharge Summary (Signed)
Physician Discharge Summary  Charles Thompson VAP:[id number] DOB: [birth year] DOA: 09/30/2021  PCP: Gaspar Garbe, MD  Admit date: 09/30/2021 Discharge date: 10/03/2021  Patient Left Against Medical Advice  Brief/Interim Summary: 60 y.o. male with medical history significant of DM; HTN; HLD: and depression presenting with a diabetic foot ulcer. He quit smoking 8-9 days ago and was thinking he was having issues with that.  He was vomiting with eating, got so weak he couldn't take it anymore.  He had an appointment with his foot doctor this AM but couldn't wait.  He had antibiotics at home but couldn't find them.  The foot started turning red a few days ago.  He feels better after the abx and fluid and he is so sleepy. He has had prior amputations - L TMA and R big toe in maybe 2016 or 2017.  +chills, sweats, unsure if he has had fever  Discharge Diagnoses:  Principal Problem:   Diabetic foot infection (HCC) Active Problems:   Diabetic ulcer of left foot associated with type 2 diabetes mellitus (HCC)   History of transmetatarsal amputation of left foot (HCC)   Major depressive disorder, recurrent episode, in remission with seasonal pattern (HCC)   Tobacco abuse   Type 2 diabetes mellitus with diabetic neuropathy, with long-term current use of insulin (HCC)   Cellulitis   Severe sepsis without septic shock (HCC)   AKI (acute kidney injury) (HCC)   Bacteremia   Mood disorder (HCC)  Diabetic foot infection with proteus bacteremia -Patient is s/p L TMA and has a chronic L midfoot ulcer that is followed by podiatry -Foot ulcers are present along the plantar L foot and now the patient has developed surrounding cellulitis -Xray negative for deep infection but MRI shows osteo -Negative lactate, no current concerns for sepsis -Pt was initially started on IV antibiotics (Rocephin/Flagyl/Vancomycin), later narrowed to rocephin -Dr. Lajoyce Corners was initially consulted per admitting physician. Discussed  with Orthopedic Surgery, who strongly recommends BKA -Podiatry was consulted who also agreed with BKA -Despite recommendations, patient adamantly refused amputation.  -Pt ultimately underwent debridement per Podiatry on 11/17 -Blood cx have returned pos for Proteus. Appreciate recs by ID -ID recs for cont current abx. If pt remains adamant to go home post-op before final cultures, recs for doxy 100mg  BID and augmentin 875mg  bid with ID f/u in 1 week. Likely will require 6 weeks abx -Today, pt stated he was adamant about going home despite recommendations by Podiatry about needing continued stay. Patient understood risks of leaving against medical advice, however, elected to leave anyway   AKI -Likely related to acute infection -Held Zestoretic  -Renal function improved -Cont to avoid nephrotoxic agents   DM -A1c of 6.3 -Cont with moderate-scale SSI while in hospital   HTN -Continue Norvasc -Held Zestoretic while in hospital   HLD -Continue Lipitor   Mood d/o -Continue Zoloft, Klonopin -Still adamantly refusing amputation, however was agreeable to debridement   Tobacco dependence -He quit 8-9 days ago -Ongoing cessation efforts are strongly encouraged. -Patch ordered at patient request.     Discharge Instructions    Follow-up Information     Nadara Mustard, MD Follow up in 1 week(s).   Specialty: Orthopedic Surgery Contact information: [address] [place] [address] [phone]         Gaspar Garbe, MD Follow up in 2 week(s).   Specialty: Internal Medicine Why: Hospital follow up Contact information: [address] [place] [address] [phone]  No Known Allergies  Consultations: Orthopedic Surgery Podiatry  Procedures/Studies: MR FOOT LEFT W WO CONTRAST  Result Date: 09/30/2021 CLINICAL DATA:  Foot swelling, diabetic, osteomyelitis suspected, no prior imaging EXAM: MRI OF THE LEFT FOREFOOT WITHOUT AND WITH  CONTRAST TECHNIQUE: Multiplanar, multisequence MR imaging of the left forefoot was performed both before and after administration of intravenous contrast. CONTRAST:  98mL GADAVIST GADOBUTROL 1 MMOL/ML IV SOLN COMPARISON:  Same day radiograph FINDINGS: Bones/Joint/Cartilage Prior transmetatarsal amputation. There is intense bony edema and enhancement with confluent low T1 signal within the medial cuneiform. Bony edema and enhancement within the base of the residual second metatarsal and medial aspect of the middle cuneiform. Ligaments The Lisfranc ligament is present and intact. Muscles and Tendons Diffuse intramuscular edema and atrophy in the foot as is commonly seen in diabetics. Fraying at the distal attachment of the peroneal longus tendon. Soft tissues There is a plantar medial soft tissue ulcer which extends to the surface of the medial cuneiform, with internal fluid signal. This appears to connect with the skin surface and is likely draining. No other nondraining rim enhancing abscess in the foot. IMPRESSION: Osteomyelitis of the medial cuneiform. Probable early osteomyelitis of the base of the second metatarsal and medial aspect of the middle cuneiform. Adjacent plantar medial soft tissue ulcer, with continuous extension to the plantar surface of the medial cuneiform and is likely draining, correlate with exam. No other nondraining rim enhancing abscess identified. Diffuse soft tissue swelling and enhancement consistent with cellulitis. Electronically Signed   By: Maurine Simmering M.D.   On: 09/30/2021 12:02   DG Foot Complete Left  Result Date: 09/30/2021 CLINICAL DATA:  Diabetic foot ulcer, evaluate for possible osteomyelitis. EXAM: LEFT FOOT - COMPLETE 3+ VIEW COMPARISON:  01/14/2021 FINDINGS: There are changes consistent with prior transmetatarsal amputation involving the second through fifth metatarsals. Near complete amputation of the first metatarsal is seen. Tarsal degenerative changes are noted.  Soft tissue ulcer is noted along the inferior aspect of the foot and along the soft tissues near the amputation site. No definitive osteomyelitis is noted. No bony erosive changes are seen. IMPRESSION: Soft tissue defects consistent with the given clinical history. Postsurgical changes without evidence of osteomyelitis. Electronically Signed   By: Inez Catalina M.D.   On: 09/30/2021 02:52   VAS [place] ABI WITH/WO TBI  Result Date: 09/30/2021  LOWER EXTREMITY DOPPLER STUDY Patient Name:  SHERARD SCATTERGOOD  Date of Exam:   09/30/2021 Medical Rec #: [id number]       Accession #:    [id number] Date of Birth: 01-05-1961      Patient Gender: M Patient Age:   58 years Exam Location:  [facility] Procedure:      VAS [place] ABI WITH/WO TBI Referring Phys: Anderson Malta YATES --------------------------------------------------------------------------------  Indications: Ulceration. High Risk Factors: Diabetes. Other Factors: 12/11/2015 - AMPUTATION RIGHT FOOT GREAT TOE                 06/22/2018 - LEFT FOOT FIRST RAY AMPUTATION                 09/09/2018 - Left Transmetatarsal Amputation.  Comparison Study: 06/20/2018 - Right: Resting right ankle-brachial index is within                   normal range. No                   evidence of significant right lower extremity arterial  disease.                    Unable to obtain TBI due to great toe amputation.                   Left: Resting left ankle-brachial index is within normal                   range. No                   evidence of significant left lower extremity arterial disease.                   The left                   toe-brachial index is normal. Performing Technologist: Carlos Levering RVT  Examination Guidelines: A complete evaluation includes at minimum, Doppler waveform signals and systolic blood pressure reading at the level of bilateral brachial, anterior tibial, and posterior tibial arteries, when vessel segments are accessible. Bilateral testing is  considered an integral part of a complete examination. Photoelectric Plethysmograph (PPG) waveforms and toe systolic pressure readings are included as required and additional duplex testing as needed. Limited examinations for reoccurring indications may be performed as noted.  ABI Findings: +--------+------------------+-----+---------+--------+ Right   Rt Pressure (mmHg)IndexWaveform Comment  +--------+------------------+-----+---------+--------+ [id number]                    triphasic         +--------+------------------+-----+---------+--------+ PTA     154               1.18 triphasic         +--------+------------------+-----+---------+--------+ DP      148               1.14 triphasic         +--------+------------------+-----+---------+--------+ +--------+------------------+-----+---------+-------+ Left    Lt Pressure (mmHg)IndexWaveform Comment +--------+------------------+-----+---------+-------+ [id number]                    triphasic        +--------+------------------+-----+---------+-------+ PTA     150               1.15 biphasic         +--------+------------------+-----+---------+-------+ DP      141               1.08 biphasic         +--------+------------------+-----+---------+-------+ +-------+-----------+-----------+------------+------------+ ABI/TBIToday's ABIToday's TBIPrevious ABIPrevious TBI +-------+-----------+-----------+------------+------------+ Right  1.18                  1.15                     +-------+-----------+-----------+------------+------------+ Left   1.15                  1.07                     +-------+-----------+-----------+------------+------------+  Summary: Right: Resting right ankle-brachial index is within normal range. No evidence of significant right lower extremity arterial disease. Unable to obtain TBI due to great toe amputation. Left: Resting left ankle-brachial index is within normal range.  No evidence of significant left lower extremity arterial disease. Unable to obtain TBI due to great toe amputation.  *See table(s) above for measurements and observations.  Electronically signed by Deitra Mayo MD on 09/30/2021 at 1:54:22 PM.  Final     Subjective: Determined to leave today  Discharge Exam: Vitals:   10/03/21 0559 10/03/21 1246  BP: (!) 123/54 125/75  Pulse: 71 76  Resp: 20 20  Temp: 98.2 F (36.8 C) 98.2 F (36.8 C)  SpO2: 98% 97%   Vitals:   10/02/21 1945 10/02/21 2211 10/03/21 0559 10/03/21 1246  BP: 136/74 137/68 (!) 123/54 125/75  Pulse: 61 80 71 76  Resp: 10 20 20 20   Temp: 97.9 F (36.6 C) 97.7 F (36.5 C) 98.2 F (36.8 C) 98.2 F (36.8 C)  TempSrc:  Oral Oral Oral  SpO2: 100% 97% 98% 97%  Weight:      Height:        General: Pt is alert, awake, not in acute distress Cardiovascular: RRR, S1/S2 + Respiratory: CTA bilaterally, no wheezing, no rhonchi Abdominal: Soft, NT, ND, bowel sounds + Extremities: no edema, no cyanosis   The results of significant diagnostics from this hospitalization (including imaging, microbiology, ancillary and laboratory) are listed below for reference.     Microbiology: Recent Results (from the past 240 hour(s))  Resp Panel by RT-PCR (Flu A&B, Covid) Peripheral     Status: None   Collection Time: 09/30/21  1:27 AM   Specimen: Peripheral; Nasopharyngeal(NP) swabs in vial transport medium  Result Value Ref Range Status   SARS Coronavirus 2 by RT PCR NEGATIVE NEGATIVE Final    Comment: (NOTE) SARS-CoV-2 target nucleic acids are NOT DETECTED.  The SARS-CoV-2 RNA is generally detectable in upper respiratory specimens during the acute phase of infection. The lowest concentration of SARS-CoV-2 viral copies this assay can detect is 138 copies/mL. A negative result does not preclude SARS-Cov-2 infection and should not be used as the sole basis for treatment or other patient management decisions. A negative  result may occur with  improper specimen collection/handling, submission of specimen other than nasopharyngeal swab, presence of viral mutation(s) within the areas targeted by this assay, and inadequate number of viral copies(<138 copies/mL). A negative result must be combined with clinical observations, patient history, and epidemiological information. The expected result is Negative.  Fact Sheet for Patients:  10/02/21  Fact Sheet for Healthcare Providers:  BloggerCourse.com  This test is no t yet approved or cleared by the [employer] FDA and  has been authorized for detection and/or diagnosis of SARS-CoV-2 by FDA under an Emergency Use Authorization (EUA). This EUA will remain  in effect (meaning this test can be used) for the duration of the COVID-19 declaration under Section 564(b)(1) of the Act, 21 U.S.C.section 360bbb-3(b)(1), unless the authorization is terminated  or revoked sooner.       Influenza A by PCR NEGATIVE NEGATIVE Final   Influenza B by PCR NEGATIVE NEGATIVE Final    Comment: (NOTE) The Xpert Xpress SARS-CoV-2/FLU/RSV plus assay is intended as an aid in the diagnosis of influenza from Nasopharyngeal swab specimens and should not be used as a sole basis for treatment. Nasal washings and aspirates are unacceptable for Xpert Xpress SARS-CoV-2/FLU/RSV testing.  Fact Sheet for Patients: [place]  Fact Sheet for Healthcare Providers: BloggerCourse.com  This test is not yet approved or cleared by the [employer] FDA and has been authorized for detection and/or diagnosis of SARS-CoV-2 by FDA under an Emergency Use Authorization (EUA). This EUA will remain in effect (meaning this test can be used) for the duration of the COVID-19 declaration under Section 564(b)(1) of the Act, 21 U.S.C. section 360bbb-3(b)(1), unless the authorization is  terminated or revoked.  Performed at [facility], [place] [address] [place], [place] [address]   Blood culture (routine x 2)     Status: Abnormal (Preliminary result)   Collection Time: 09/30/21  1:30 AM   Specimen: BLOOD LEFT HAND  Result Value Ref Range Status   Specimen Description   Final    BLOOD LEFT HAND Performed at [facility], [place] [address], [place], [place] [address]    Special Requests   Final    BOTTLES DRAWN AEROBIC AND ANAEROBIC Blood Culture adequate volume Performed at [place] [address] [place], [place] [address]    Culture  Setup Time   Final    GRAM NEGATIVE RODS AEROBIC BOTTLE ONLY CRITICAL RESULT CALLED TO, READ BACK BY AND VERIFIED WITH: PHARMD ANH PHAM 1032 [id number] FCP    Culture (A)  Final    PROTEUS MIRABILIS SUSCEPTIBILITIES TO FOLLOW Performed at [facility], [place] [address] [place], [place] [address]    Report Status PENDING  Incomplete  Blood culture (routine x 2)     Status: None (Preliminary result)   Collection Time: 09/30/21  1:30 AM   Specimen: BLOOD  Result Value Ref Range Status   Specimen Description   Final    BLOOD RIGHT ANTECUBITAL Performed at [place] [address], [place], [place] [address]    Special Requests   Final    BOTTLES DRAWN AEROBIC AND ANAEROBIC Blood Culture adequate volume Performed at [place] [address] [place], [place] [address]    Culture   Final    NO GROWTH 3 DAYS Performed at [facility], [place] [address] [place],  [address]    Report Status PENDING  Incomplete  Blood Culture ID Panel (Reflexed)     Status: Abnormal   Collection Time: 09/30/21  1:30 AM  Result Value Ref Range Status   Enterococcus faecalis NOT DETECTED NOT DETECTED Final   Enterococcus Faecium NOT DETECTED NOT DETECTED Final   Listeria monocytogenes NOT DETECTED NOT DETECTED Final   Staphylococcus species  NOT DETECTED NOT DETECTED Final   Staphylococcus aureus (BCID) NOT DETECTED NOT DETECTED Final   Staphylococcus epidermidis NOT DETECTED NOT DETECTED Final   Staphylococcus lugdunensis NOT DETECTED NOT DETECTED Final   Streptococcus species NOT DETECTED NOT DETECTED Final   Streptococcus agalactiae NOT DETECTED NOT DETECTED Final   Streptococcus pneumoniae NOT DETECTED NOT DETECTED Final   Streptococcus pyogenes NOT DETECTED NOT DETECTED Final   A.calcoaceticus-baumannii NOT DETECTED NOT DETECTED Final   Bacteroides fragilis NOT DETECTED NOT DETECTED Final   Enterobacterales DETECTED (A) NOT DETECTED Final    Comment: Enterobacterales represent a large order of gram negative bacteria, not a single organism. CRITICAL RESULT CALLED TO, READ BACK BY AND VERIFIED WITH: PHARMD ANH PHAM 1032 [id number] FCP    Enterobacter cloacae complex NOT DETECTED NOT DETECTED Final   Escherichia coli NOT DETECTED NOT DETECTED Final   Klebsiella aerogenes NOT DETECTED NOT DETECTED Final   Klebsiella oxytoca NOT DETECTED NOT DETECTED Final   Klebsiella pneumoniae NOT DETECTED NOT DETECTED Final   Proteus species DETECTED (A) NOT DETECTED Final    Comment: CRITICAL RESULT CALLED TO, READ BACK BY AND VERIFIED WITH: PHARMD ANH PHAM 1032 [id number] FCP    Salmonella species NOT DETECTED NOT DETECTED Final   Serratia marcescens NOT DETECTED NOT DETECTED Final   Haemophilus influenzae NOT DETECTED NOT DETECTED Final   Neisseria meningitidis NOT DETECTED NOT  DETECTED Final   Pseudomonas aeruginosa NOT DETECTED NOT DETECTED Final   Stenotrophomonas maltophilia NOT DETECTED NOT DETECTED Final   Candida albicans NOT DETECTED NOT DETECTED Final   Candida auris NOT DETECTED NOT DETECTED Final   Candida glabrata NOT DETECTED NOT DETECTED Final   Candida krusei NOT DETECTED NOT DETECTED Final   Candida parapsilosis NOT DETECTED NOT DETECTED Final   Candida tropicalis NOT DETECTED NOT DETECTED Final   Cryptococcus  neoformans/gattii NOT DETECTED NOT DETECTED Final   CTX-M ESBL NOT DETECTED NOT DETECTED Final   Carbapenem resistance IMP NOT DETECTED NOT DETECTED Final   Carbapenem resistance KPC NOT DETECTED NOT DETECTED Final   Carbapenem resistance NDM NOT DETECTED NOT DETECTED Final   Carbapenem resist OXA 48 LIKE NOT DETECTED NOT DETECTED Final   Carbapenem resistance VIM NOT DETECTED NOT DETECTED Final    Comment: Performed at [facility], [address] [place], [place] [address]  Aerobic/Anaerobic Culture w Gram Stain (surgical/deep wound)     Status: None (Preliminary result)   Collection Time: 09/30/21  2:34 AM   Specimen: Foot; Wound  Result Value Ref Range Status   Specimen Description   Final    FOOT LEFT Performed at [facility], [place] [address], [place], [place] [address]    Special Requests   Final    NONE Performed at [facility], [place] [address] [place], [place] [address]    Gram Stain   Final    FEW WBC PRESENT,BOTH PMN AND MONONUCLEAR FEW GRAM POSITIVE COCCI IN PAIRS    Culture   Final    FEW PROTEUS MIRABILIS FEW STAPHYLOCOCCUS AUREUS CULTURE REINCUBATED FOR BETTER GROWTH Performed at [facility], [place] [address] [place], [place] [address]    Report Status PENDING  Incomplete   Organism ID, Bacteria PROTEUS MIRABILIS  Final      Susceptibility   Proteus mirabilis - MIC*    AMPICILLIN <=2 SENSITIVE Sensitive     CEFAZOLIN <=4 SENSITIVE Sensitive     CEFEPIME <=0.12 SENSITIVE Sensitive     CEFTAZIDIME <=1 SENSITIVE Sensitive     CEFTRIAXONE <=0.25 SENSITIVE Sensitive     CIPROFLOXACIN <=0.25 SENSITIVE Sensitive     GENTAMICIN <=1 SENSITIVE Sensitive     IMIPENEM 2 SENSITIVE Sensitive     TRIMETH/SULFA <=20 SENSITIVE Sensitive     AMPICILLIN/SULBACTAM <=2 SENSITIVE Sensitive     PIP/TAZO <=4 SENSITIVE Sensitive     * FEW PROTEUS MIRABILIS  Aerobic/Anaerobic Culture w Gram Stain (surgical/deep wound)     Status: None  (Preliminary result)   Collection Time: 10/02/21  6:55 PM   Specimen: Bone; Tissue  Result Value Ref Range Status   Specimen Description   Final    BONE FOOT LEFT Performed at [place] [address] [place], [place] [address]    Special Requests   Final    NONE Performed at [facility], [place] [address] [place], [place] [address]    Gram Stain   Final    FEW WBC PRESENT, PREDOMINANTLY MONONUCLEAR NO ORGANISMS SEEN Performed at [facility], [place] [address] [place], [place] [address]    Culture PENDING  Incomplete   Report Status PENDING  Incomplete  Aerobic/Anaerobic Culture w Gram Stain (surgical/deep wound)     Status: None (Preliminary result)   Collection Time: 10/02/21  6:57 PM   Specimen: Foot, Left; Abscess  Result Value Ref Range Status   Specimen  Description   Final    ABSCESS FOOT LEFT Performed at [place] [address], [place], [place] [address]    Special Requests   Final    NONE Performed at [facility], [place] [address], [place], [place] [address]    Gram Stain   Final    RARE WBC PRESENT, PREDOMINANTLY MONONUCLEAR NO ORGANISMS SEEN Performed at [facility], [place] [address], [place], [place] [address]    Culture PENDING  Incomplete   Report Status PENDING  Incomplete     Labs: BNP (last 3 results) No results for input(s): BNP in the last 8760 hours. Basic Metabolic Panel: Recent Labs  Lab 09/30/21 0130 10/01/21 0504 10/02/21 0450  NA 128* 133* 135  K 3.4* 2.9* 3.7  CL 91* 99 105  CO2 21* 23 25  GLUCOSE 149* 104* 136*  BUN 28* 18 15  CREATININE 1.41* 0.78 0.83  CALCIUM 8.8* 8.5* 8.3*   Liver Function Tests: Recent Labs  Lab 09/30/21 0130 10/02/21 0450  AST 17 13*  ALT 23 17  ALKPHOS 95 79  BILITOT 1.1 0.5  PROT 7.8 6.7  ALBUMIN 3.6 3.0*   No results for input(s): LIPASE, AMYLASE in the last 168 hours. No results for input(s): AMMONIA  in the last 168 hours. CBC: Recent Labs  Lab 09/30/21 0130 10/01/21 0504  WBC 13.8* 9.1  NEUTROABS 10.9*  --   HGB 14.5 12.5*  HCT 41.7 36.3*  MCV 80.8 81.4  PLT 309 261   Cardiac Enzymes: No results for input(s): CKTOTAL, CKMB, CKMBINDEX, TROPONINI in the last 168 hours. BNP: Invalid input(s): POCBNP CBG: Recent Labs  Lab 10/02/21 1828 10/02/21 1922 10/02/21 2213 10/03/21 0747 10/03/21 1209  GLUCAP 203* 200* 232* 150* 177*   D-Dimer No results for input(s): DDIMER in the last 72 hours. Hgb A1c No results for input(s): HGBA1C in the last 72 hours. Lipid Profile No results for input(s): CHOL, HDL, LDLCALC, TRIG, CHOLHDL, LDLDIRECT in the last 72 hours. Thyroid function studies No results for input(s): TSH, T4TOTAL, T3FREE, THYROIDAB in the last 72 hours.  Invalid input(s): FREET3 Anemia work up No results for input(s): VITAMINB12, FOLATE, FERRITIN, TIBC, IRON, RETICCTPCT in the last 72 hours. Urinalysis    Component Value Date/Time   COLORURINE YELLOW 09/30/2021 0400   APPEARANCEUR CLEAR 09/30/2021 0400   LABSPEC 1.012 09/30/2021 0400   PHURINE 5.0 09/30/2021 0400   GLUCOSEU >=500 (A) 09/30/2021 0400   HGBUR SMALL (A) 09/30/2021 0400   BILIRUBINUR NEGATIVE 09/30/2021 0400   KETONESUR 5 (A) 09/30/2021 0400   PROTEINUR NEGATIVE 09/30/2021 0400   UROBILINOGEN 1.0 12/18/2007 1926   NITRITE NEGATIVE 09/30/2021 0400   LEUKOCYTESUR NEGATIVE 09/30/2021 0400   Sepsis Labs Invalid input(s): PROCALCITONIN,  WBC,  LACTICIDVEN Microbiology Recent Results (from the past 240 hour(s))  Resp Panel by RT-PCR (Flu A&B, Covid) Peripheral     Status: None   Collection Time: 09/30/21  1:27 AM   Specimen: Peripheral; Nasopharyngeal(NP) swabs in vial transport medium  Result Value Ref Range Status   SARS Coronavirus 2 by RT PCR NEGATIVE NEGATIVE Final    Comment: (NOTE) SARS-CoV-2 target nucleic acids are NOT DETECTED.  The SARS-CoV-2 RNA is generally detectable in upper  respiratory specimens during the acute phase of infection. The lowest concentration of SARS-CoV-2 viral copies this assay can detect is 138 copies/mL. A negative result does not preclude SARS-Cov-2 infection and should not be used as the sole basis for treatment or  other patient management decisions. A negative result may occur with  improper specimen collection/handling, submission of specimen other than nasopharyngeal swab, presence of viral mutation(s) within the areas targeted by this assay, and inadequate number of viral copies(<138 copies/mL). A negative result must be combined with clinical observations, patient history, and epidemiological information. The expected result is Negative.  Fact Sheet for Patients:  EntrepreneurPulse.com.au  Fact Sheet for Healthcare Providers:  IncredibleEmployment.be  This test is no t yet approved or cleared by the [place] FDA and  has been authorized for detection and/or diagnosis of SARS-CoV-2 by FDA under an Emergency Use Authorization (EUA). This EUA will remain  in effect (meaning this test can be used) for the duration of the COVID-19 declaration under Section 564(b)(1) of the Act, 21 U.S.C.section 360bbb-3(b)(1), unless the authorization is terminated  or revoked sooner.       Influenza A by PCR NEGATIVE NEGATIVE Final   Influenza B by PCR NEGATIVE NEGATIVE Final    Comment: (NOTE) The Xpert Xpress SARS-CoV-2/FLU/RSV plus assay is intended as an aid in the diagnosis of influenza from Nasopharyngeal swab specimens and should not be used as a sole basis for treatment. Nasal washings and aspirates are unacceptable for Xpert Xpress SARS-CoV-2/FLU/RSV testing.  Fact Sheet for Patients: EntrepreneurPulse.com.au  Fact Sheet for Healthcare Providers: IncredibleEmployment.be  This test is not yet approved or cleared by the [place] FDA and has been  authorized for detection and/or diagnosis of SARS-CoV-2 by FDA under an Emergency Use Authorization (EUA). This EUA will remain in effect (meaning this test can be used) for the duration of the COVID-19 declaration under Section 564(b)(1) of the Act, 21 U.S.C. section 360bbb-3(b)(1), unless the authorization is terminated or revoked.  Performed at [facility], [place] [address] [place], [place] [address]   Blood culture (routine x 2)     Status: Abnormal (Preliminary result)   Collection Time: 09/30/21  1:30 AM   Specimen: BLOOD LEFT HAND  Result Value Ref Range Status   Specimen Description   Final    BLOOD LEFT HAND Performed at [facility], [place] [address], [place], [place] [address]    Special Requests   Final    BOTTLES DRAWN AEROBIC AND ANAEROBIC Blood Culture adequate volume Performed at [place] [address], [place], [place] [address]    Culture  Setup Time   Final    GRAM NEGATIVE RODS AEROBIC BOTTLE ONLY CRITICAL RESULT CALLED TO, READ BACK BY AND VERIFIED WITH: PHARMD ANH PHAM 1032 [id number] FCP    Culture (A)  Final    PROTEUS MIRABILIS SUSCEPTIBILITIES TO FOLLOW Performed at [facility], [place] [address] [place], [place] [address]    Report Status PENDING  Incomplete  Blood culture (routine x 2)     Status: None (Preliminary result)   Collection Time: 09/30/21  1:30 AM   Specimen: BLOOD  Result Value Ref Range Status   Specimen Description   Final    BLOOD RIGHT ANTECUBITAL Performed at [place] [address] [place], [place] [address]    Special Requests   Final    BOTTLES DRAWN AEROBIC AND ANAEROBIC Blood Culture adequate volume Performed at [place] [address] [place], [place] [address]    Culture   Final    NO GROWTH 3 DAYS Performed at [facility], [place] [address], [place], [place] [address]    Report Status PENDING  Incomplete  Blood  Culture ID Panel (Reflexed)     Status: Abnormal   Collection Time: 09/30/21  1:30 AM  Result Value Ref Range Status   Enterococcus faecalis NOT DETECTED NOT DETECTED Final   Enterococcus Faecium NOT DETECTED NOT DETECTED Final   Listeria monocytogenes NOT DETECTED NOT DETECTED Final   Staphylococcus species NOT DETECTED NOT DETECTED Final   Staphylococcus aureus (BCID) NOT DETECTED NOT DETECTED Final   Staphylococcus epidermidis NOT DETECTED NOT DETECTED Final   Staphylococcus lugdunensis NOT DETECTED NOT DETECTED Final   Streptococcus species NOT DETECTED NOT DETECTED Final   Streptococcus agalactiae NOT DETECTED NOT DETECTED Final   Streptococcus pneumoniae NOT DETECTED NOT DETECTED Final   Streptococcus pyogenes NOT DETECTED NOT DETECTED Final   A.calcoaceticus-baumannii NOT DETECTED NOT DETECTED Final   Bacteroides fragilis NOT DETECTED NOT DETECTED Final   Enterobacterales DETECTED (A) NOT DETECTED Final    Comment: Enterobacterales represent a large order of gram negative bacteria, not a single organism. CRITICAL RESULT CALLED TO, READ BACK BY AND VERIFIED WITH: PHARMD ANH PHAM 1032 [id number] FCP    Enterobacter cloacae complex NOT DETECTED NOT DETECTED Final   Escherichia coli NOT DETECTED NOT DETECTED Final   Klebsiella aerogenes NOT DETECTED NOT DETECTED Final   Klebsiella oxytoca NOT DETECTED NOT DETECTED Final   Klebsiella pneumoniae NOT DETECTED NOT DETECTED Final   Proteus species DETECTED (A) NOT DETECTED Final    Comment: CRITICAL RESULT CALLED TO, READ BACK BY AND VERIFIED WITH: PHARMD ANH PHAM 1032 [id number] FCP    Salmonella species NOT DETECTED NOT DETECTED Final   Serratia marcescens NOT DETECTED NOT DETECTED Final   Haemophilus influenzae NOT DETECTED NOT DETECTED Final   Neisseria meningitidis NOT DETECTED NOT DETECTED Final   Pseudomonas aeruginosa NOT DETECTED NOT DETECTED Final   Stenotrophomonas maltophilia NOT DETECTED NOT DETECTED Final   Candida albicans  NOT DETECTED NOT DETECTED Final   Candida auris NOT DETECTED NOT DETECTED Final   Candida glabrata NOT DETECTED NOT DETECTED Final   Candida krusei NOT DETECTED NOT DETECTED Final   Candida parapsilosis NOT DETECTED NOT DETECTED Final   Candida tropicalis NOT DETECTED NOT DETECTED Final   Cryptococcus neoformans/gattii NOT DETECTED NOT DETECTED Final   CTX-M ESBL NOT DETECTED NOT DETECTED Final   Carbapenem resistance IMP NOT DETECTED NOT DETECTED Final   Carbapenem resistance KPC NOT DETECTED NOT DETECTED Final   Carbapenem resistance NDM NOT DETECTED NOT DETECTED Final   Carbapenem resist OXA 48 LIKE NOT DETECTED NOT DETECTED Final   Carbapenem resistance VIM NOT DETECTED NOT DETECTED Final    Comment: Performed at [facility], [address], [place], [place] [address]  Aerobic/Anaerobic Culture w Gram Stain (surgical/deep wound)     Status: None (Preliminary result)   Collection Time: 09/30/21  2:34 AM   Specimen: Foot; Wound  Result Value Ref Range Status   Specimen Description   Final    FOOT LEFT Performed at [facility], [place] [address], [place], [place] [address]    Special Requests   Final    NONE Performed at [facility], [place] [address] [place],  [address]    Gram Stain   Final    FEW WBC PRESENT,BOTH PMN AND MONONUCLEAR FEW GRAM POSITIVE COCCI IN PAIRS    Culture   Final    FEW PROTEUS MIRABILIS FEW STAPHYLOCOCCUS AUREUS CULTURE REINCUBATED FOR BETTER GROWTH Performed at [facility], [place] [address], [place], [place]  [address]    Report Status PENDING  Incomplete   Organism ID, Bacteria PROTEUS MIRABILIS  Final      Susceptibility   Proteus mirabilis - MIC*    AMPICILLIN <=2 SENSITIVE Sensitive     CEFAZOLIN <=4 SENSITIVE Sensitive     CEFEPIME <=0.12 SENSITIVE Sensitive     CEFTAZIDIME <=1 SENSITIVE Sensitive     CEFTRIAXONE <=0.25 SENSITIVE Sensitive     CIPROFLOXACIN <=0.25 SENSITIVE Sensitive     GENTAMICIN  <=1 SENSITIVE Sensitive     IMIPENEM 2 SENSITIVE Sensitive     TRIMETH/SULFA <=20 SENSITIVE Sensitive     AMPICILLIN/SULBACTAM <=2 SENSITIVE Sensitive     PIP/TAZO <=4 SENSITIVE Sensitive     * FEW PROTEUS MIRABILIS  Aerobic/Anaerobic Culture w Gram Stain (surgical/deep wound)     Status: None (Preliminary result)   Collection Time: 10/02/21  6:55 PM   Specimen: Bone; Tissue  Result Value Ref Range Status   Specimen Description   Final    BONE FOOT LEFT Performed at [place] [address], [place], [place] [address]    Special Requests   Final    NONE Performed at [facility], [place] [address], [place], [place] [address]    Gram Stain   Final    FEW WBC PRESENT, PREDOMINANTLY MONONUCLEAR NO ORGANISMS SEEN Performed at [facility], [place] [address] [place], [place] [address]    Culture PENDING  Incomplete   Report Status PENDING  Incomplete  Aerobic/Anaerobic Culture w Gram Stain (surgical/deep wound)     Status: None (Preliminary result)   Collection Time: 10/02/21  6:57 PM   Specimen: Foot, Left; Abscess  Result Value Ref Range Status   Specimen Description   Final    ABSCESS FOOT LEFT Performed at [facility] [address], [place], [place] [address]    Special Requests   Final    NONE Performed at [facility], [place] [address] [place], [place] [address]    Gram Stain   Final    RARE WBC PRESENT, PREDOMINANTLY MONONUCLEAR NO ORGANISMS SEEN Performed at [facility], [place] [address], [place], [place] [address]    Culture PENDING  Incomplete   Report Status PENDING  Incomplete     SIGNED:   Marylu Lund, MD  [facility] 10/03/2021, 2:56 PM  If 7PM-7AM, please contact night-coverage

## 2021-10-03 NOTE — Progress Notes (Signed)
Subjective: POD #1 s/p left foot wound excision, bone biopsy.  He states he is ready to go home.  No fevers or chills he reports.  He has no other concerns.   Objective: AAO x3, NAD DP/PT pulses palpable bilaterally, CRT less than 3 seconds Wound present on the plantar aspect left foot measuring approximately 2 x 2 x 2 cm.  There is no exposed bone although it is very close to the cuneiform.  There is no fluctuation crepitation there is no drainage or pus.  Mild edema still present in the foot without any ascending cellulitis.  No malodor.  No pain with calf compression, swelling, warmth, erythema  Assessment: POD #1 s/p left foot wound excision/debridement, bone biopsy  Plan: Dressing was changed today.  Saline wet-to-dry dressing was applied followed by 4 x 4's, ABD, Kerlix, Ace.  I discussed the need to wait for antibiotic recommendations based on culture results however he states he is not can await for them to come back.  Discussed this is not advised and he is leaving AGAINST MEDICAL ADVICE and he understands this.  We will send him home with oral antibiotics based on infectious disease recommendations.  I will see him back on Tuesday in the office.  Should not change the dressing until scalpel doing this.  Nonweightbearing left side.    Ovid Curd, DPM

## 2021-10-03 NOTE — Evaluation (Signed)
Physical Therapy Evaluation Patient Details Name: Charles Thompson MRN: [id number] DOB: 06-30-1961 Today's Date: 10/03/2021  History of Present Illness  Pt s/p L foot ulcer resection and bone biopsy 10/02/21.  Pt with hx of CKD, DM, peripheral neuropathy., osteomyelitis bil feet, MDD,  Clinical Impression  Pt admitted as above and presenting with functional mobility limitations 2* NWB status on L LE and associated balance deficits.  This date, pt up to ambulate in hall with RW and demonstrates ability to comply with NWB on L LE but with follow through ~50% of time and needing frequent reminders.  Pt states he is "limiting the wt he places on foot".  Pt states he feels confident in ability to manage at home and that he does have limited assist of family to help with groceries etc.     Recommendations for follow up therapy are one component of a multi-disciplinary discharge planning process, led by the attending physician.  Recommendations may be updated based on patient status, additional functional criteria and insurance authorization.  Follow Up Recommendations No PT follow up (Pt could benefit from follow up HHPT to reinforce NWB and management in home but pt declines service.)    Assistance Recommended at Discharge Intermittent Supervision/Assistance  Functional Status Assessment Patient has had a recent decline in their functional status and demonstrates the ability to make significant improvements in function in a reasonable and predictable amount of time.  Equipment Recommendations  None recommended by PT    Recommendations for Other Services       Precautions / Restrictions Precautions Precautions: Fall Restrictions Weight Bearing Restrictions: Yes LLE Weight Bearing: Non weight bearing      Mobility  Bed Mobility Overal bed mobility: Modified Independent             General bed mobility comments: no assist required    Transfers Overall transfer level: Needs  assistance Equipment used: Rolling walker (2 wheels) Transfers: Sit to/from Stand Sit to Stand: Supervision           General transfer comment: no physical assist but cues for LE management, NWB compliance and use of UEs to self assist    Ambulation/Gait Ambulation/Gait assistance: Min guard;Supervision Gait Distance (Feet): 45 Feet Assistive device: Rolling walker (2 wheels) Gait Pattern/deviations: Step-to pattern;Shuffle       General Gait Details: cues for position from RW and use of wheels vs lifting.  Pt complient with NWB ~50% of time but requiring cueing to maintain.  Stairs            Wheelchair Mobility    Modified Rankin (Stroke Patients Only)       Balance Overall balance assessment: Needs assistance Sitting-balance support: No upper extremity supported;Feet supported Sitting balance-Leahy Scale: Good     Standing balance support: Single extremity supported Standing balance-Leahy Scale: Poor Standing balance comment: Required RW for balance to maintain NWB on L                             Pertinent Vitals/Pain Pain Assessment: No/denies pain    Home Living Family/patient expects to be discharged to:: Private residence Living Arrangements: Alone Available Help at Discharge: Family;Friend(s);Available PRN/intermittently Type of Home: House Home Access: Stairs to enter Entrance Stairs-Rails: Right;Left;Can reach both Entrance Stairs-Number of Steps: 3-4   Home Layout: One level Home Equipment: Agricultural consultant (2 wheels);Rollator (4 wheels);Cane - single point Additional Comments: Pt states family available to assist wtih groceries  Prior Function Prior Level of Function : Independent/Modified Independent                     Hand Dominance   Dominant Hand: Right    Extremity/Trunk Assessment   Upper Extremity Assessment Upper Extremity Assessment: Overall WFL for tasks assessed    Lower Extremity  Assessment Lower Extremity Assessment: Overall WFL for tasks assessed    Cervical / Trunk Assessment Cervical / Trunk Assessment: Normal  Communication   Communication: No difficulties  Cognition Arousal/Alertness: Awake/alert Behavior During Therapy: WFL for tasks assessed/performed;Impulsive Overall Cognitive Status: Within Functional Limits for tasks assessed                                          General Comments      Exercises     Assessment/Plan    PT Assessment Patient needs continued PT services  PT Problem List Decreased balance;Decreased mobility;Decreased activity tolerance;Decreased knowledge of use of DME       PT Treatment Interventions DME instruction;Gait training;Stair training;Functional mobility training;Therapeutic activities;Therapeutic exercise;Patient/family education;Balance training    PT Goals (Current goals can be found in the Care Plan section)  Acute Rehab PT Goals Patient Stated Goal: HOME ASAP PT Goal Formulation: With patient Time For Goal Achievement: 10/10/21 Potential to Achieve Goals: Good    Frequency Min 3X/week   Barriers to discharge Decreased caregiver support DOes not have 24/7    Co-evaluation               AM-PAC PT "6 Clicks" Mobility  Outcome Measure Help needed turning from your back to your side while in a flat bed without using bedrails?: None Help needed moving from lying on your back to sitting on the side of a flat bed without using bedrails?: None Help needed moving to and from a bed to a chair (including a wheelchair)?: A Little Help needed standing up from a chair using your arms (e.g., wheelchair or bedside chair)?: A Little Help needed to walk in hospital room?: A Little Help needed climbing 3-5 steps with a railing? : A Little 6 Click Score: 20    End of Session Equipment Utilized During Treatment: Gait belt Activity Tolerance: Patient tolerated treatment well Patient left: in  bed;with call bell/phone within reach;with bed alarm set Nurse Communication: Mobility status PT Visit Diagnosis: Difficulty in walking, not elsewhere classified (R26.2)    Time: 4166-0630 PT Time Calculation (min) (ACUTE ONLY): 14 min   Charges:   PT Evaluation $PT Eval Low Complexity: 1 Low          Mauro Kaufmann PT Acute Rehabilitation Services Pager [phone] Office [phone]   Charles Thompson 10/03/2021, 11:57 AM

## 2021-10-03 NOTE — Progress Notes (Signed)
Id brief note   Patient s/p I&D 11/17 Doesn't appear he will wait on culture result   -plan as per 11/17 note -will see in clinic next week (appointment made) -discussed with primary team

## 2021-10-03 NOTE — Care Management Important Message (Signed)
Important Message  Patient Details IM Letter given to the Patient. Name: Charles Thompson MRN: [id number] Date of Birth: 08-15-1961   Medicare Important Message Given:  Yes     Caren Macadam 10/03/2021, 11:59 AM

## 2021-10-04 LAB — CULTURE, BLOOD (ROUTINE X 2): Special Requests: ADEQUATE

## 2021-10-04 LAB — FUNGUS STAIN

## 2021-10-05 LAB — AEROBIC/ANAEROBIC CULTURE W GRAM STAIN (SURGICAL/DEEP WOUND)

## 2021-10-05 LAB — CULTURE, BLOOD (ROUTINE X 2)
Culture: NO GROWTH
Special Requests: ADEQUATE

## 2021-10-05 NOTE — Op Note (Signed)
PATIENT:  Charles Thompson  60 y.o. male   PRE-OPERATIVE DIAGNOSIS:  osteomyelitis, ulcer   POST-OPERATIVE DIAGNOSIS:  osteomyelitis, ulcer   PROCEDURE:  Left foot ulcer debridement/excision, bone biopsy.    SURGEON:  Surgeon(s) and Role:    * Oluwadarasimi Redmon, Lesia Sago, DPM - Primary   PHYSICIAN ASSISTANT:    ASSISTANTS: none    ANESTHESIA:   general   EBL:  1 mL    BLOOD ADMINISTERED:none   DRAINS: none    LOCAL MEDICATIONS USED:  OTHER 20 cc lidocaine and marcaine plain   SPECIMEN:  Source of Specimen:  wound culture, bone culture   DISPOSITION OF SPECIMEN:  PATHOLOGY   COUNTS:  YES   TOURNIQUET:  * Missing tourniquet times found for documented tourniquets in log: [id number] *   DICTATION: .Reubin Milan Dictation   PLAN OF CARE: Admit to inpatient    PATIENT DISPOSITION:  PACU - hemodynamically stable.   Delay start of Pharmacological VTE agent (>24hrs) due to surgical blood loss or risk of bleeding: no  Indication for surgery:  Charles Thompson presented to the hospital with worsening infection of his left foot.  MRI did show osteomyelitis.  Orthopedics was consulted and recommended below-knee amputation.  Also discussed below-knee amputation with him as well but the patient is adamant on not having this performed.  Due to infection recommended wound debridement, bone biopsy.  He did agree with this.  Discussed the surgery and his postoperative course.  Alternatives risks and complications were discussed.  No promises or guarantees given from the procedure and all questions into the best my ability.  I discussed the still high risk of limb loss and he states he understands this.  Procedure in detail: The patient was with verbally and visually identified by myself and nursing staff and the anesthesia staff preoperatively.  He was transferred the operating room via stretcher placed operative table in supine position.  After adequate plane of anesthesia was obtained a well-padded pneumatic calf  tourniquet was applied making sure to pad all bony prominences.  Timeout was performed and a mixture of lidocaine, Marcaine plain was infiltrated in a regional fashion.  The left lower extremity was then scrubbed, prepped, and draped in normal sterile fashion.  A small stab incision was made along the area of the cuneiform away from the area of ulceration.  At this time a Jamshidi needle was utilized to remove a piece of bone in this subset for bone culture.  The incision was irrigated incision was closed with nylon.  Attention was then directed to the plantar wound which there is obvious purulence coming from the wound.  Prior to wound debridement it measured 1.1 x 1.1 x 1.5 cm. I utilized a 15 blade scalpel to circumferentially excise any nonviable devitalized tissue.  After I excised the wound measured 2 x 2 x 2 cm and down to bone.  After excision of the ulcer there is no further purulence identified.  I copiously irrigated the wound with saline and hemostasis was achieved.  I lysed Gelfoam to help with hemostasis as well as electrocautery.  Wound was then further evaluated no further purulence or signs of infection noted.  Ulcer was again irrigated and saline wet-to-dry dressings applied to the wound followed by dry sterile dressing.  He was woken from anesthesia and found to tolerate the procedure well and complications.  He was then transferred PACU vital signs stable vascular status intact.  Postoperative course: Conditions remained inpatient on broad-spectrum antibiotics and await culture  results.  I discussed if he were to leave AGAINST MEDICAL ADVICE that this is going to increase the change of limb loss and spread of infection for which he understands.  I also discussed with patient's sister postoperatively at his request. She is going to try to get him to stay in the hospital as well.

## 2021-10-06 LAB — FUNGUS STAIN

## 2021-10-06 LAB — ACID FAST SMEAR (AFB, MYCOBACTERIA): Acid Fast Smear: NEGATIVE

## 2021-10-07 ENCOUNTER — Other Ambulatory Visit: Payer: Self-pay

## 2021-10-07 ENCOUNTER — Ambulatory Visit (INDEPENDENT_AMBULATORY_CARE_PROVIDER_SITE_OTHER): Payer: Medicare Other | Admitting: Podiatry

## 2021-10-07 DIAGNOSIS — E11621 Type 2 diabetes mellitus with foot ulcer: Secondary | ICD-10-CM

## 2021-10-07 DIAGNOSIS — L97422 Non-pressure chronic ulcer of left heel and midfoot with fat layer exposed: Secondary | ICD-10-CM

## 2021-10-07 NOTE — Patient Instructions (Signed)
[facility] [address] [place],  [place]  [address]

## 2021-10-07 NOTE — Progress Notes (Signed)
  Subjective:  Patient ID: Charles Thompson, male    DOB: November 02, 1961,  MRN: [id number]  Chief Complaint  Patient presents with   Diabetic Ulcer    Follow up left midfoot ulcer and hospital follow up. Pt was admitted in the hospital due to an infection of his left foot.    DOS: 10/03/21 Procedure: Left foot ulcer debridement/excision, bone biopsy.    60 y.o. male presents with the above complaint. History confirmed with patient. Has been taking PO abx as directed.  Of note patient left the hospital AMA.  Objective:  Physical Exam: no tenderness at the surgical site, local edema noted, and calf supple, nontender. Incision: healing well, no significant drainage, no dehiscence, no significant erythema  Plantar left midfoot ulceration approx 1.5x1.5 with fuly granular base no warmth/erythema/SOI Assessment:   1. Diabetic ulcer of left midfoot associated with type 2 diabetes mellitus, with fat layer exposed (HCC)    Plan:  Patient was evaluated and treated and all questions answered.  Post-operative State but with chronic ulceration -Post-op wound left well-appearing.  -Plantar wound improving, dressed with prisma and DSD. -XRs needed at follow-up: none -Has appt with ID tomorrow. Discussed importance of f/u with them. Appreciate reccs, will likely need 6 weeks of therapy. -Right foot calluses trimmed as courtesy  No follow-ups on file.

## 2021-10-08 ENCOUNTER — Inpatient Hospital Stay: Payer: Medicare Other | Admitting: Internal Medicine

## 2021-10-08 LAB — AEROBIC/ANAEROBIC CULTURE W GRAM STAIN (SURGICAL/DEEP WOUND)

## 2021-10-15 ENCOUNTER — Other Ambulatory Visit: Payer: Self-pay | Admitting: Podiatry

## 2021-10-15 ENCOUNTER — Telehealth: Payer: Self-pay | Admitting: *Deleted

## 2021-10-15 MED ORDER — SULFAMETHOXAZOLE-TRIMETHOPRIM 800-160 MG PO TABS: 1.0000 | ORAL_TABLET | Freq: Two times a day (BID) | ORAL | 0 refills | Status: DC

## 2021-10-15 NOTE — Telephone Encounter (Signed)
-----   Message from Vivi Barrack, DPM sent at 10/15/2021  5:34 PM EST ----- Misty Stanley- can you please call him to let him know due to the cultures I am going to change his antibiotic from doxycyline to bactrim which I have sent to the pharmacy. He should continue the Augmentin. Also, he did not show up for his infectious disease appointment I would encourage him to call and reschedule to help manage the antibiotics.

## 2021-10-15 NOTE — Telephone Encounter (Signed)
Called and spoke with the patient and relayed the message per Dr Ardelle Anton to stop the doxycycline and to take the bactrim and augmentin due to the antibiotic (doxyccyline) was not helping and the patient stated that he missed the infectious disease appointment and I stated that the patient needed to call and get rescheduled so they could manage the antibiotics and the patient stated that he thought the infection was not there any more and I stated again to call the infectious disease and to go and get the new antibiotics and that the patient was seeing Dr Samuella Cota next week. Charles Thompson

## 2021-10-21 ENCOUNTER — Ambulatory Visit (INDEPENDENT_AMBULATORY_CARE_PROVIDER_SITE_OTHER): Payer: Medicare Other | Admitting: Podiatry

## 2021-10-21 ENCOUNTER — Other Ambulatory Visit: Payer: Self-pay

## 2021-10-21 DIAGNOSIS — L97422 Non-pressure chronic ulcer of left heel and midfoot with fat layer exposed: Secondary | ICD-10-CM | POA: Diagnosis not present

## 2021-10-21 DIAGNOSIS — E11621 Type 2 diabetes mellitus with foot ulcer: Secondary | ICD-10-CM | POA: Diagnosis not present

## 2021-10-21 DIAGNOSIS — Z91199 Patient's noncompliance with other medical treatment and regimen due to unspecified reason: Secondary | ICD-10-CM

## 2021-10-21 MED ORDER — SILVER SULFADIAZINE 1 % EX CREA: TOPICAL_CREAM | CUTANEOUS | 0 refills | Status: AC

## 2021-10-21 NOTE — Progress Notes (Signed)
  Subjective:  Patient ID: Charles Thompson, male    DOB: Jan 20, 1961,  MRN: [id number]  Chief Complaint  Patient presents with   Wound Check    Patient denies, nausea, vomiting, fever and chills. No concerns voiced today   DOS: 10/03/21 Procedure: Left foot ulcer debridement/excision, bone biopsy.    60 y.o. male presents with the above complaint. History confirmed with patient. Did not make his appt with ID - had trouble finding it and then drove home due to frustration. Has not picked up his new abx either. Has not been dressing his foot wound between visits.  Of note patient left the hospital AMA.  Objective:  Physical Exam: no tenderness at the surgical site, local edema noted, and calf supple, nontender. Incision: healing well, no significant drainage, no dehiscence, no significant erythema  Plantar left midfoot ulceration approx 2x2 with fuly granular base - undermined rim pre-debridement. No warmth/erythema/SOI Assessment:   1. Diabetic ulcer of left midfoot associated with type 2 diabetes mellitus, with fat layer exposed (HCC)   2. Non-compliance     Plan:  Patient was evaluated and treated and all questions answered.  Post-operative State but with chronic ulceration -Post-op wound left well-appearing.  -Plantar wound slightly worsened today, debrided and dressed with mechanical wound VAC -XRs needed at follow-up: none -Did not keep his appt with ID. Discussed importance of taking the targeted abx we prescribed. Patient stated he will pick them up. -He has not been changing his dressing in between visits. Discussed importance of doing so. Rx silvadene to apply daily. -Right foot calluses trimmed as courtesy  Procedure: Excisional Debridement of Wound Indication: Removal of non-viable soft tissue from the wound to promote healing.  Anesthesia: none Pre-Debridement Wound Measurements: 1.5 cm x 1.5 cm x 0.4 cm  Post-Debridement Wound Measurements: 2 cm x 2 cm x 0.4 cm   Type of Debridement: Sharp Excisional Tissue Removed: Non-viable soft tissue Instrumentation: 15 blade and tissue nipper Depth of Debridement: subcutaneous tissue. Technique: Sharp excisional debridement to bleeding, viable wound base.  Dressing: Dry, sterile, compression dressing. Disposition: Patient tolerated procedure well.  Procedure: Single Use Wound VAC Application Location: left midfoto Wound Measurement: 2x2 Technique: Padded dressing applied to wound, reinforced with draping. Device turned on with good seal noted. Disposition: Patient tolerated procedure well.  Return in about 2 weeks (around 11/04/2021) for Wound Care.

## 2021-10-31 LAB — FUNGUS CULTURE WITH STAIN

## 2021-10-31 LAB — FUNGUS CULTURE RESULT

## 2021-10-31 LAB — FUNGAL ORGANISM REFLEX

## 2021-11-04 ENCOUNTER — Encounter: Payer: Medicare Other | Admitting: Podiatry

## 2021-11-04 ENCOUNTER — Ambulatory Visit: Payer: Medicare Other | Admitting: Podiatry

## 2021-11-04 LAB — FUNGUS CULTURE WITH STAIN

## 2021-11-04 LAB — FUNGAL ORGANISM REFLEX

## 2021-11-04 LAB — FUNGUS CULTURE RESULT

## 2021-11-07 ENCOUNTER — Ambulatory Visit (INDEPENDENT_AMBULATORY_CARE_PROVIDER_SITE_OTHER): Payer: Medicare Other | Admitting: Podiatry

## 2021-11-07 ENCOUNTER — Other Ambulatory Visit: Payer: Self-pay

## 2021-11-07 DIAGNOSIS — E11621 Type 2 diabetes mellitus with foot ulcer: Secondary | ICD-10-CM | POA: Diagnosis not present

## 2021-11-07 DIAGNOSIS — L97411 Non-pressure chronic ulcer of right heel and midfoot limited to breakdown of skin: Secondary | ICD-10-CM

## 2021-11-07 DIAGNOSIS — L97422 Non-pressure chronic ulcer of left heel and midfoot with fat layer exposed: Secondary | ICD-10-CM

## 2021-11-07 NOTE — Progress Notes (Signed)
°  Subjective:  Patient ID: Charles Thompson, male    DOB: [birth year],  MRN: [id number]  Chief Complaint  Patient presents with   Diabetic Ulcer      2 week wound care   DOS: 10/03/21 Procedure: Left foot ulcer debridement/excision, bone biopsy.    60 y.o. male presents with the above complaint. History confirmed with patient. Denies new pedal issues  Wearing his previous DM inserts  Objective:  Physical Exam: no tenderness at the surgical site, local edema noted, and calf supple, nontender. Incision: healing well, no significant drainage, no dehiscence, no significant erythema  Plantar left midfoot ulceration approx 2x2 with fuly granular base - undermined rim pre-debridement. No warmth/erythema/SOI Assessment:   1. Diabetic ulcer of left midfoot associated with type 2 diabetes mellitus, with fat layer exposed (HCC)   2. Diabetic ulcer of right midfoot associated with type 2 diabetes mellitus, limited to breakdown of skin (HCC)    Plan:  Patient was evaluated and treated and all questions answered.  Post-operative State but with chronic ulceration -Midfoot wound slightly improved. -Wound debrided as below, cleansed and dressed with SSD and foam border dressings. -Discussed importance of not applying too much pressure to the feet. I would like him to start wearing surgical shoes as he is developing increased pressure under several of the metatarsals. Will make him an appt for new DM inserts but he is to continue the surgical shoes until that time -Discussed importance of daily dressing with silvadene and bandages. Patient verbalized understanding. -Discussed importance of dressing daily with abx ointment and band-aid -Right foot calluses trimmed as courtesy  Procedure: Excisional Debridement of Wound Indication: Removal of non-viable soft tissue from the wound to promote healing.  Anesthesia: none Pre-Debridement Wound Measurements: 2 cm x 1 cm x 0.4 cm  Post-Debridement Wound  Measurements: 2 cm x 1.5 cm x 0.4 cm  Type of Debridement: Sharp Excisional Tissue Removed: Non-viable soft tissue Instrumentation: 15 blade and tissue nipper Depth of Debridement: subcutaneous tissue. Technique: Sharp excisional debridement to bleeding, viable wound base.  Dressing: Dry, sterile, compression dressing. Disposition: Patient tolerated procedure well.   Return in about 2 weeks (around 11/21/2021) for Wound Care.

## 2021-11-11 ENCOUNTER — Ambulatory Visit: Payer: Medicare Other | Admitting: Podiatry

## 2021-11-21 ENCOUNTER — Ambulatory Visit (INDEPENDENT_AMBULATORY_CARE_PROVIDER_SITE_OTHER): Payer: Medicare Other | Admitting: Podiatry

## 2021-11-21 ENCOUNTER — Other Ambulatory Visit: Payer: Self-pay

## 2021-11-21 DIAGNOSIS — L97422 Non-pressure chronic ulcer of left heel and midfoot with fat layer exposed: Secondary | ICD-10-CM

## 2021-11-21 DIAGNOSIS — E11621 Type 2 diabetes mellitus with foot ulcer: Secondary | ICD-10-CM

## 2021-11-21 LAB — ACID FAST CULTURE WITH REFLEXED SENSITIVITIES (MYCOBACTERIA): Acid Fast Culture: NEGATIVE

## 2021-11-25 ENCOUNTER — Ambulatory Visit: Payer: Medicare Other

## 2021-11-25 ENCOUNTER — Other Ambulatory Visit: Payer: Medicare Other

## 2021-11-25 ENCOUNTER — Other Ambulatory Visit: Payer: Self-pay

## 2021-11-25 DIAGNOSIS — Z89432 Acquired absence of left foot: Secondary | ICD-10-CM

## 2021-11-25 DIAGNOSIS — E08621 Diabetes mellitus due to underlying condition with foot ulcer: Secondary | ICD-10-CM

## 2021-11-25 NOTE — Progress Notes (Signed)
SITUATION Reason for Consult: Evaluation for Prefabricated Diabetic Shoes and Bilateral Custom Diabetic Inserts. Patient / Caregiver Report: Patient only wants insoles, no shoes  OBJECTIVE DATA: Patient History / Diagnosis:    ICD-10-CM   1. History of transmetatarsal amputation of left foot ([place])  Z89.432     2. Diabetic ulcer of other part of right foot associated with diabetes mellitus due to underlying condition, with fat layer exposed [facility])  [id number]    L97.512       Presence of Diabetic Complications: - Peripheral Neuropathy - Amputation: Side: Left Level: Transmet  Current or Previous Devices: Custom insoles and transmet toe filler  In-Person Foot Examination:  Skin presentation:   Thin, Shiny, Hairless Nail presentation:   Thick, Ingrown, With Fungus Ulcers & Callousing:   Multiple bilateral  Shoe Size: 10.5XW  ORTHOTIC RECOMMENDATION Recommended Devices: - 1x transmet prosthesis, 1x right custom insole  GOALS OF SHOES AND INSOLES - Reduce shear and pressure - Reduce / Prevent callus formation - Reduce / Prevent ulceration - Protect the fragile healing compromised diabetic foot.  Patient would benefit from diabetic shoes and inserts as patient has diabetes mellitus and the patient has one or more of the following conditions: - History of partial or complete amputation of the foot - History of previous foot ulceration. - History of pre-ulcerative callus - Peripheral neuropathy with evidence of callus formation - Foot deformity - Poor circulation  ACTIONS PERFORMED Patient was casted for insoles via crush box and measured for shoes via brannock device. Procedure was explained and patient tolerated procedure well. All questions were answered and concerns addressed.  PLAN Insurance to be verified and out of pocket cost communicated to patient. Once cost verified and agreed upon and diabetic certification received, casts are to be sent to [facility] for fabrication.  Patient is to be called for fitting when devices are ready.

## 2021-11-28 ENCOUNTER — Telehealth: Payer: Self-pay | Admitting: Podiatry

## 2021-11-28 NOTE — Telephone Encounter (Signed)
Called [facility] [facility] to verify benefits for cpt L5000. No Josem Kaufmann is needed per [employer]. Reference # [id number]  Per automated service as well no Josem Kaufmann is  required at all places of service for cpt L5000. Reference # [id number].Marland Kitchen

## 2021-12-01 NOTE — Progress Notes (Signed)
°  Subjective:  Patient ID: Charles Thompson, male    DOB: Aug 07, 1961,  MRN: [id number]  Chief Complaint  Patient presents with   Diabetic Ulcer     2 week wound care, left midfoot   DOS: 10/03/21 Procedure: Left foot ulcer debridement/excision, bone biopsy.    61 y.o. male presents with the above complaint. History confirmed with patient. Denies new pedal issues  Wearing his previous DM inserts.  Objective:  Physical Exam: no tenderness at the surgical site, local edema noted, and calf supple, nontender. Incision: healing well, no significant drainage, no dehiscence, no significant erythema  Plantar left midfoot ulceration approx 2x1.5 with fuly granular base - undermined rim pre-debridement. No warmth/erythema/SOI Assessment:   1. Diabetic ulcer of left midfoot associated with type 2 diabetes mellitus, with fat layer exposed (HCC)    Plan:  Patient was evaluated and treated and all questions answered.  Post-operative State but with chronic ulceration -Midfoot wound improved. -Wound minimally debrided cleansed and dressed with SSD and foam border dressings. -Will get him scheduled for new DM inserts. -Discussed importance of dressing daily with abx ointment and band-aid -Right foot calluses trimmed as courtesy   No follow-ups on file.

## 2021-12-02 ENCOUNTER — Other Ambulatory Visit: Payer: Medicare Other

## 2021-12-09 ENCOUNTER — Ambulatory Visit (INDEPENDENT_AMBULATORY_CARE_PROVIDER_SITE_OTHER): Payer: Medicare Other | Admitting: Podiatry

## 2021-12-09 DIAGNOSIS — Z91199 Patient's noncompliance with other medical treatment and regimen due to unspecified reason: Secondary | ICD-10-CM

## 2021-12-09 NOTE — Progress Notes (Signed)
   Complete physical exam  Patient: Charles Thompson   DOB: 09/05/2000   61 y.o. Male  MRN: [id number]  Subjective:    No chief complaint on file.   Charles Thompson is a 61 y.o. male who presents today for a complete physical exam. She reports consuming a {diet types:17450} diet. {types:19826} She generally feels {DESC; WELL/FAIRLY WELL/POORLY:18703}. She reports sleeping {DESC; WELL/FAIRLY WELL/POORLY:18703}. She {does/does not:200015} have additional problems to discuss today.    Most recent fall risk assessment:    05/13/2022   10:42 AM  Fall Risk   Falls in the past year? 0  Number falls in past yr: 0  Injury with Fall? 0  Risk for fall due to : No Fall Risks  Follow up Falls evaluation completed     Most recent depression screenings:    05/13/2022   10:42 AM 04/03/2021   10:46 AM  PHQ 2/9 Scores  PHQ - 2 Score 0 0  PHQ- 9 Score 5     {VISON DENTAL STD PSA (Optional):27386}  {History (Optional):23778}  Patient Care Team: Jessup, Joy, NP as PCP - General (Nurse Practitioner)   Outpatient Medications Prior to Visit  Medication Sig   fluticasone (FLONASE) 50 MCG/ACT nasal spray Place 2 sprays into both nostrils in the morning and at bedtime. After 7 days, reduce to once daily.   norgestimate-ethinyl estradiol (SPRINTEC 28) 0.25-35 MG-MCG tablet Take 1 tablet by mouth daily.   Nystatin POWD Apply liberally to affected area 2 times per day   spironolactone (ALDACTONE) 100 MG tablet Take 1 tablet (100 mg total) by mouth daily.   No facility-administered medications prior to visit.    ROS        Objective:     There were no vitals taken for this visit. {Vitals History (Optional):23777}  Physical Exam   No results found for any visits on 06/18/22. {Show previous labs (optional):23779}    Assessment & Plan:    Routine Health Maintenance and Physical Exam  Immunization History  Administered Date(s) Administered   DTaP 11/19/1999, 01/15/2000,  03/25/2000, 12/09/2000, 06/24/2004   Hepatitis A 04/20/2008, 04/26/2009   Hepatitis B 09/06/1999, 10/14/1999, 03/25/2000   HiB (PRP-OMP) 11/19/1999, 01/15/2000, 03/25/2000, 12/09/2000   IPV 11/19/1999, 01/15/2000, 09/13/2000, 06/24/2004   Influenza,inj,Quad PF,6+ Mos 07/27/2014   Influenza-Unspecified 10/26/2012   MMR 09/13/2001, 06/24/2004   Meningococcal Polysaccharide 04/25/2012   Pneumococcal Conjugate-13 12/09/2000   Pneumococcal-Unspecified 03/25/2000, 06/08/2000   Tdap 04/25/2012   Varicella 09/13/2000, 04/20/2008    Health Maintenance  Topic Date Due   HIV Screening  Never done   Hepatitis C Screening  Never done   INFLUENZA VACCINE  06/16/2022   PAP-Cervical Cytology Screening  06/18/2022 (Originally 09/04/2020)   PAP SMEAR-Modifier  06/18/2022 (Originally 09/04/2020)   TETANUS/TDAP  06/18/2022 (Originally 04/25/2022)   HPV VACCINES  Discontinued   COVID-19 Vaccine  Discontinued    Discussed health benefits of physical activity, and encouraged her to engage in regular exercise appropriate for her age and condition.  Problem List Items Addressed This Visit   None Visit Diagnoses     Annual physical exam    -  Primary   Cervical cancer screening       Need for Tdap vaccination          No follow-ups on file.     Joy Jessup, NP   

## 2021-12-15 ENCOUNTER — Telehealth: Payer: Self-pay | Admitting: Podiatry

## 2021-12-15 NOTE — Telephone Encounter (Signed)
Diabetic inserts in, lvm for pt to call to schedule an appt to pick them up.

## 2021-12-22 ENCOUNTER — Ambulatory Visit (INDEPENDENT_AMBULATORY_CARE_PROVIDER_SITE_OTHER): Payer: Medicare Other | Admitting: Podiatry

## 2021-12-22 ENCOUNTER — Encounter: Payer: Self-pay | Admitting: Podiatry

## 2021-12-22 ENCOUNTER — Other Ambulatory Visit: Payer: Self-pay

## 2021-12-22 DIAGNOSIS — Z794 Long term (current) use of insulin: Secondary | ICD-10-CM | POA: Diagnosis not present

## 2021-12-22 DIAGNOSIS — L84 Corns and callosities: Secondary | ICD-10-CM | POA: Diagnosis not present

## 2021-12-22 DIAGNOSIS — E1140 Type 2 diabetes mellitus with diabetic neuropathy, unspecified: Secondary | ICD-10-CM | POA: Diagnosis not present

## 2021-12-22 DIAGNOSIS — L97411 Non-pressure chronic ulcer of right heel and midfoot limited to breakdown of skin: Secondary | ICD-10-CM | POA: Diagnosis not present

## 2021-12-22 DIAGNOSIS — E11621 Type 2 diabetes mellitus with foot ulcer: Secondary | ICD-10-CM | POA: Diagnosis not present

## 2021-12-22 DIAGNOSIS — Q6671 Congenital pes cavus, right foot: Secondary | ICD-10-CM

## 2021-12-22 NOTE — Progress Notes (Signed)
°  Subjective:  Patient ID: Charles Thompson, male    DOB: [birth year],  MRN: [id number]  Chief Complaint  Patient presents with   Foot Ulcer   DOS: 10/03/21 Procedure: Left foot ulcer debridement/excision, bone biopsy.    61 y.o. male presents with the above complaint. History confirmed with patient. Denies new pedal issues. Wearing his previous DM inserts. Missed his last appointment but states the wound is doing well.   Objective:  Physical Exam: no tenderness at the surgical site, local edema noted, and calf supple, nontender. Incision: healing well, no significant drainage, no dehiscence, no significant erythema  Hyperkeratotic tissue noted right sub first and fith metatarsals and second digit. Hyperkeratotic tissue on left distal stump and heel.   Plantar left midfoot ulceration approx 1x 0.5 with fuly granular base - undermined rim pre-debridement. No warmth/erythema/SOI Assessment:   1. Diabetic ulcer of right midfoot associated with type 2 diabetes mellitus, limited to breakdown of skin (HCC)   2. Type 2 diabetes mellitus with diabetic neuropathy, with long-term current use of insulin (HCC)   3. Cavus deformity of right foot   4. Pre-ulcerative calluses     Plan:  Patient was evaluated and treated and all questions answered.  Post-operative State but with chronic ulceration -Midfoot wound improved. -Wound minimally debrided cleansed and dressed with SSD and foam border dressings. -Waiting on DM inserts. . -Discussed importance of dressing daily with abx ointment and band-aid -Hyperkeratotic tissue debrided without incident.  -Follow-up in 2 weeks with Dr. Samuella Cota   Return in about 2 weeks (around 01/05/2022) for wound check.

## 2021-12-25 ENCOUNTER — Ambulatory Visit (INDEPENDENT_AMBULATORY_CARE_PROVIDER_SITE_OTHER): Payer: Medicare Other

## 2021-12-25 ENCOUNTER — Other Ambulatory Visit: Payer: Self-pay

## 2021-12-25 DIAGNOSIS — E1140 Type 2 diabetes mellitus with diabetic neuropathy, unspecified: Secondary | ICD-10-CM | POA: Diagnosis not present

## 2021-12-25 DIAGNOSIS — Z89432 Acquired absence of left foot: Secondary | ICD-10-CM | POA: Diagnosis not present

## 2021-12-25 DIAGNOSIS — Z794 Long term (current) use of insulin: Secondary | ICD-10-CM

## 2021-12-25 NOTE — Progress Notes (Signed)
SITUATION Reason for Visit: Fitting of Diabetic Insoles Patient / Caregiver Report:  Patient is satisfied with fit and function  OBJECTIVE DATA: Patient History / Diagnosis:     ICD-10-CM   1. Type 2 diabetes mellitus with diabetic neuropathy, with long-term current use of insulin (HCC)  E11.40    Z79.4     2. History of transmetatarsal amputation of left foot ([place])  Z89.432       Change in Status:   None  ACTIONS PERFORMED: In-Person Delivery, patient was fit with: - 1x L5000 PDAC approved Custom Partial Foot Prosthesis - 3x Right A5513 PDAC approved CAM milled custom diabetic insoles  Devices were verified for structural integrity and safety. Patient wore devices in office. Skin was inspected and free of areas of concern after wearing shoes and inserts. Shoes and inserts fit properly. Patient / Caregiver provided with ferbal instruction and demonstration regarding donning, doffing, wear, care, proper fit, function, purpose, cleaning, and use of shoes and insoles ' and in all related precautions and risks and benefits regarding shoes and insoles. Patient / Caregiver was instructed to wear properly fitting socks with shoes at all times. Patient was also provided with verbal instruction regarding how to report any failures or malfunctions of devices, and necessary follow up care. Patient / Caregiver was also instructed to contact physician regarding change in status that may affect function of shoes and inserts.   Patient / Caregiver verbalized undersatnding of instruction provided. Patient / Caregiver demonstrated independence with proper donning and doffing of shoes and inserts.  PLAN Patient to follow up as needed. Plan of care was discussed with and agreed upon by patient and/or caregiver. All questions were answered and concerns addressed.

## 2022-01-13 ENCOUNTER — Other Ambulatory Visit: Payer: Self-pay

## 2022-01-13 ENCOUNTER — Ambulatory Visit (INDEPENDENT_AMBULATORY_CARE_PROVIDER_SITE_OTHER): Payer: Medicare Other | Admitting: Podiatry

## 2022-01-13 DIAGNOSIS — Z89432 Acquired absence of left foot: Secondary | ICD-10-CM | POA: Diagnosis not present

## 2022-01-13 DIAGNOSIS — L97521 Non-pressure chronic ulcer of other part of left foot limited to breakdown of skin: Secondary | ICD-10-CM | POA: Diagnosis not present

## 2022-01-13 DIAGNOSIS — E1140 Type 2 diabetes mellitus with diabetic neuropathy, unspecified: Secondary | ICD-10-CM | POA: Diagnosis not present

## 2022-01-13 DIAGNOSIS — L84 Corns and callosities: Secondary | ICD-10-CM | POA: Diagnosis not present

## 2022-01-13 DIAGNOSIS — Z794 Long term (current) use of insulin: Secondary | ICD-10-CM

## 2022-01-14 NOTE — Progress Notes (Signed)
Subjective: 61 year old male presents the office today for evaluation of wound on his left foot as well as for multiple preulcerative calluses.  He states the wounds are well-healed he has not seen any drainage or pus or any swelling or redness.  He denies any fevers or chills currently.  He has no new concerns since he was last seen.  Objective: AAO x3, NAD DP/PT pulses palpable bilaterally, CRT less than 3 seconds Protective sensation absent  Multiple preulcerative calluses are noted bilaterally.  On the right foot submetatarsal 1, 3, 5, heel and on the left side along the heel, midfoot as well as distally.  On the left distal there was some dried blood under the callus.  Upon debridement small amount of bleeding occurred and is 1 superficial area skin breakdown, skin fissure noted.  There is no purulence.  There is no surrounding erythema, ascending cellulitis.  No fluctuation or crepitation.  There is no malodor. No pain with calf compression, swelling, warmth, erythema       Assessment: Preulcerative calluses  Plan: -All treatment options discussed with the patient including all alternatives, risks, complications.  -Sharp debrided the callus with any complications.  Upon debridement of the left foot distal callus plantarly small skin fissure noted.  Cleansed the area and antibiotic ointment and bandage applied.  Recommend daily dressing changes.  Monitor for any signs or symptoms of infection to let the office know immediately should any occur or go to emergency room. -Patient encouraged to call the office with any questions, concerns, change in symptoms.   Vivi Barrack DPM

## 2022-02-10 ENCOUNTER — Other Ambulatory Visit: Payer: Self-pay

## 2022-02-10 ENCOUNTER — Ambulatory Visit (INDEPENDENT_AMBULATORY_CARE_PROVIDER_SITE_OTHER): Payer: Medicare Other | Admitting: Podiatry

## 2022-02-10 DIAGNOSIS — L84 Corns and callosities: Secondary | ICD-10-CM | POA: Diagnosis not present

## 2022-02-10 DIAGNOSIS — E1140 Type 2 diabetes mellitus with diabetic neuropathy, unspecified: Secondary | ICD-10-CM | POA: Diagnosis not present

## 2022-02-10 DIAGNOSIS — Z89432 Acquired absence of left foot: Secondary | ICD-10-CM | POA: Diagnosis not present

## 2022-02-10 DIAGNOSIS — Q6671 Congenital pes cavus, right foot: Secondary | ICD-10-CM | POA: Diagnosis not present

## 2022-02-10 DIAGNOSIS — Z794 Long term (current) use of insulin: Secondary | ICD-10-CM

## 2022-02-15 NOTE — Progress Notes (Signed)
Subjective: ?61 year old male presents the office today for evaluation of wound on his left foot as well as for multiple preulcerative calluses.  He has not seen any openings, drainage or bleeding.  No swelling or redness of his feet.  He has no fevers or chills.  No other concerns.  ? ?Objective: ?AAO x3, NAD ?DP/PT pulses palpable bilaterally, CRT less than 3 seconds ?Protective sensation absent  ?Multiple preulcerative calluses are noted bilaterally.  On the right foot submetatarsal 1, 3, 5, heel and on the left side along the heel, midfoot as well as distally.  No significant dried blood today.  During debridement no bleeding.  There are no ulcerations. ?No pain with calf compression, swelling, warmth, erythema ? ?Assessment: ?Preulcerative calluses ? ?Plan: ?-All treatment options discussed with the patient including all alternatives, risks, complications.  ?-Sharp debrided the callus with any complications/bleeding.  Recommend moisturizer on a regular basis and continue offloading.  Needs to monitor very closely for any skin breakdown or any signs or symptoms of infection.  If anything were to change to let me know immediately. ? ?Return in about 4 weeks (around 03/10/2022). ? ?Vivi Barrack DPM ?

## 2022-03-10 ENCOUNTER — Ambulatory Visit (INDEPENDENT_AMBULATORY_CARE_PROVIDER_SITE_OTHER): Payer: Medicare Other | Admitting: Podiatry

## 2022-03-10 DIAGNOSIS — L97511 Non-pressure chronic ulcer of other part of right foot limited to breakdown of skin: Secondary | ICD-10-CM | POA: Diagnosis not present

## 2022-03-10 DIAGNOSIS — L84 Corns and callosities: Secondary | ICD-10-CM | POA: Diagnosis not present

## 2022-03-19 NOTE — Progress Notes (Signed)
Subjective: ?61 year old male presents the office today for multiple preulcerative calluses.  He has not seen any reoccurrence of ulcers at this time.  No swelling or redness of his feet.  He has no fevers or chills.  He states he is on his feet a lot more recently as he has been building a walkway and each piece is 20 pounds.  No other concerns.  ? ?Objective: ?AAO x3, NAD ?DP/PT pulses palpable bilaterally, CRT less than 3 seconds ?Protective sensation absent  ?Multiple preulcerative calluses are noted bilaterally.  On the right foot submetatarsal 1, 3, 5, heel and on the left side along the heel, midfoot as well as distally.  No significant dried blood today.  During debridement today there was a small superficial skin fissure, bleeding present on the right submetatarsal 1.  There is no probing, no tunneling.  There is no surrounding erythema, ascending cellulitis.  There is no fluctuation or crepitation but there is no malodor.  There are no ulcerations. ?No pain with calf compression, swelling, warmth, erythema ? ?Assessment: ?Preulcerative calluses; superficial wound right foot ? ?Plan: ?-All treatment options discussed with the patient including all alternatives, risks, complications.  ?-Sharp debrided the callus with any complications/bleeding.  However there was noted to be superficial wound present on the right foot submetatarsal 1 area.  No signs of infection currently.  Discussed washing with soap and water daily.  Apply a small amount of antibiotic ointment and a bandage daily.  Monitor for any signs or symptoms of infection.  Offloading. ? ?Vivi Barrack DPM ? ?

## 2022-04-07 ENCOUNTER — Ambulatory Visit: Payer: Medicare Other | Admitting: Podiatry

## 2022-04-07 DIAGNOSIS — E1140 Type 2 diabetes mellitus with diabetic neuropathy, unspecified: Secondary | ICD-10-CM

## 2022-04-07 DIAGNOSIS — L84 Corns and callosities: Secondary | ICD-10-CM | POA: Diagnosis not present

## 2022-04-07 DIAGNOSIS — Z794 Long term (current) use of insulin: Secondary | ICD-10-CM | POA: Diagnosis not present

## 2022-04-15 NOTE — Progress Notes (Signed)
Subjective: 61 year old male presents the office today for multiple preulcerative calluses.  He has not seen any reoccurrence of ulcers at this time.  He states he is noticing that he has noticed a blood blister of the right second toe.  He has no pain although does have neuropathy.  The new area started about a week ago.  He states that his project that he has been working on building a walkway is almost complete.  Jill Alexanders has been playing golf.  Objective: AAO x3, NAD DP/PT pulses palpable bilaterally, CRT less than 3 seconds Protective sensation absent  Multiple preulcerative calluses are noted bilaterally.  On the right foot submetatarsal 1, 3, 5, heel and on the left side along the heel, midfoot as well as distally.  On the left foot distally there is an area of dried blood which is preulcerative which we need to monitor closely but there is no skin breakdown identified this time.  There is a new superficial area of dried blood on the right second toe.  There is no edema, erythema or signs of infection. No pain with calf compression, swelling, warmth, erythema  Assessment: Preulcerative calluses  Plan: -All treatment options discussed with the patient including all alternatives, risks, complications.  -Debrided hyperkeratotic lesions with any complications or bleeding.  I am concerned that the areas that are preulcerative as well as a new area in the right second toe.  I do think his activity level has contributed to this and also playing golf and not wearing his offloading insert when playing golf.  Discussed need to monitor very closely.  I wanted to bring his golf shoe with him see if we can help take pressure off of this.  If there is any skin breakdown let me know immediately.  Monitor for any signs or symptoms of infection.  Return in about 4 weeks (around 05/05/2022).  Vivi Barrack DPM

## 2022-05-05 ENCOUNTER — Ambulatory Visit: Payer: Medicare Other | Admitting: Podiatry

## 2022-05-05 DIAGNOSIS — E1140 Type 2 diabetes mellitus with diabetic neuropathy, unspecified: Secondary | ICD-10-CM | POA: Diagnosis not present

## 2022-05-05 DIAGNOSIS — L84 Corns and callosities: Secondary | ICD-10-CM

## 2022-05-05 DIAGNOSIS — L97521 Non-pressure chronic ulcer of other part of left foot limited to breakdown of skin: Secondary | ICD-10-CM

## 2022-05-05 DIAGNOSIS — Z794 Long term (current) use of insulin: Secondary | ICD-10-CM

## 2022-05-20 ENCOUNTER — Telehealth: Payer: Self-pay | Admitting: Podiatry

## 2022-05-20 NOTE — Telephone Encounter (Signed)
Pt wants to speak to Dr Ardelle Anton or nurse about a new pair of shoes ([employer]) he recently purchased to be sure they are the right shoes for his diabetic foot. He states he wants the doctors recommendation.   Please advise

## 2022-06-11 ENCOUNTER — Ambulatory Visit: Payer: Medicare Other | Admitting: Podiatry

## 2022-06-11 DIAGNOSIS — E1140 Type 2 diabetes mellitus with diabetic neuropathy, unspecified: Secondary | ICD-10-CM | POA: Diagnosis not present

## 2022-06-11 DIAGNOSIS — Z794 Long term (current) use of insulin: Secondary | ICD-10-CM

## 2022-06-11 DIAGNOSIS — L84 Corns and callosities: Secondary | ICD-10-CM

## 2022-06-11 DIAGNOSIS — Z89432 Acquired absence of left foot: Secondary | ICD-10-CM | POA: Diagnosis not present

## 2022-06-11 NOTE — Progress Notes (Signed)
Subjective: 61 year old male presents the office today for multiple preulcerative calluses.  States he has some discomfort of the calluses.  Is not seeing any opening.  No swelling redness or any drainage.   Objective: AAO x3, NAD DP/PT pulses palpable bilaterally, CRT less than 3 seconds Protective sensation absent  Multiple preulcerative calluses are noted bilaterally.  On the right foot submetatarsal 1, 3, 5, heel and on the left side along the heel, midfoot as well as distally.  On the left foot distally there is an area of dried blood which is preulcerative on the first metatarsal plantarly.  Cooperative throughout tissue symptoms partial as well as the heel on the left foot.  There is no other ulcerations noted.  No edema, erythema.  No malodor.  No fluctuance or crepitation. No pain with calf compression, swelling, warmth, erythema  Assessment: Preulcerative calluses; superficial skin breakdown left foot  Plan: -All treatment options discussed with the patient including all alternatives, risks, complications.  -For the hyperkeratotic lesions with any complications or bleeding.  However on the left foot there was some minimal skin breakdown.  Recommended small amount of Betadine dressing changes daily this appears to be improving.  Continue offloading.  Limit activity, elevation.  Monitor closely for any signs or symptoms of infection. -We will check on new L5000 insert for the left side that held against offloading enough.  We will check on this for him or if needed refer to [facility].  Vivi Barrack DPM  Check for new left insert l5000

## 2022-07-13 ENCOUNTER — Ambulatory Visit (INDEPENDENT_AMBULATORY_CARE_PROVIDER_SITE_OTHER): Payer: Medicare Other | Admitting: Podiatry

## 2022-07-13 DIAGNOSIS — E1140 Type 2 diabetes mellitus with diabetic neuropathy, unspecified: Secondary | ICD-10-CM | POA: Diagnosis not present

## 2022-07-13 DIAGNOSIS — L97421 Non-pressure chronic ulcer of left heel and midfoot limited to breakdown of skin: Secondary | ICD-10-CM

## 2022-07-13 DIAGNOSIS — Z89432 Acquired absence of left foot: Secondary | ICD-10-CM | POA: Diagnosis not present

## 2022-07-13 DIAGNOSIS — L84 Corns and callosities: Secondary | ICD-10-CM

## 2022-07-13 DIAGNOSIS — Z794 Long term (current) use of insulin: Secondary | ICD-10-CM

## 2022-07-13 NOTE — Progress Notes (Unsigned)
Subjective: 61 year old male presents the office today for multiple preulcerative calluses.  States he has some discomfort of the calluses.  He has not seen any open sores and feels the wounds of been doing better.  Asking about new inserts.  Objective: AAO x3, NAD DP/PT pulses palpable bilaterally, CRT less than 3 seconds Protective sensation absent  Multiple preulcerative calluses are noted bilaterally.  On the right foot submetatarsal 1, 3, 5, heel and on the left side along the heel, midfoot as well as distally.  On the plantar aspect the left foot hallux distal medial portion with a thick callus with some dried blood.  Upon debridement there is a superficial area skin breakdown with granular tissue without any probing, undermining or tunneling.  It measures about 0.6 x 0.5 cm.  No fluctuance or crepitation. No pain with calf compression, swelling, warmth, erythema  Assessment: Preulcerative calluses; superficial skin breakdown left foot  Plan: -All treatment options discussed with the patient including all alternatives, risks, complications.  -For the hyperkeratotic lesions with any complications or bleeding.  On the left foot also debrided the calluses reveals no superficial wound.  Recommended a small amount of antibiotic ointment dressing changes daily and continue offloading.  He is on his feet quite a bit which I do think aggravates his symptoms.  I have given him a prescription for new inserts to go to [facility]. -He did ask about pain medication.  Offered him a referral to pain management if he can require narcotics for this.  Vivi Barrack DPM

## 2022-08-17 ENCOUNTER — Ambulatory Visit: Payer: Medicare Other | Admitting: Podiatry

## 2022-08-17 ENCOUNTER — Ambulatory Visit (INDEPENDENT_AMBULATORY_CARE_PROVIDER_SITE_OTHER): Payer: Medicare Other | Admitting: Podiatry

## 2022-08-17 ENCOUNTER — Telehealth: Payer: Self-pay | Admitting: Podiatry

## 2022-08-17 DIAGNOSIS — L97421 Non-pressure chronic ulcer of left heel and midfoot limited to breakdown of skin: Secondary | ICD-10-CM | POA: Diagnosis not present

## 2022-08-17 DIAGNOSIS — E1140 Type 2 diabetes mellitus with diabetic neuropathy, unspecified: Secondary | ICD-10-CM | POA: Diagnosis not present

## 2022-08-17 DIAGNOSIS — Z794 Long term (current) use of insulin: Secondary | ICD-10-CM

## 2022-08-17 MED ORDER — SILVER SULFADIAZINE 1 % EX CREA: 1.0000 | TOPICAL_CREAM | Freq: Every day | CUTANEOUS | 0 refills | Status: AC

## 2022-08-17 MED ORDER — DOXYCYCLINE HYCLATE 100 MG PO TABS: 100.0000 mg | ORAL_TABLET | Freq: Two times a day (BID) | ORAL | 0 refills | Status: DC

## 2022-08-17 NOTE — Telephone Encounter (Signed)
Left message to schedule pt for a 2 week f/u for ulcer , per Dr Jacqualyn Posey .

## 2022-08-18 NOTE — Telephone Encounter (Signed)
2nd attempt to get patient scheduled for follow up appointment.

## 2022-08-23 NOTE — Progress Notes (Signed)
Subjective Chief Complaint  Patient presents with   Diabetic Ulcer    Diabetic callus bilateral     61 year old male presents the office today for multiple preulcerative calluses.  States the left was not doing as well.  Denies any drainage or pus but states it has opened up.   He has been playing golf.  Played this morning.  Also thinks this may be bothering him on his left foot.   Objective: AAO x3, NAD DP/PT pulses palpable bilaterally, CRT less than 3 seconds Protective sensation absent  Multiple preulcerative calluses are noted bilaterally.  On the right foot submetatarsal 1, 3, 5, heel and on the left side along the heel, midfoot as well as distally.  On the plantar aspect the left foot with an area that thick callus on the plantar medial aspect of the forefoot there is significant dried blood under the callus and there is a granular wound present.  There is no probing, undermining or tunneling.  There is some malodor from macerated tissue.  There is no fluctuation or crepitation. No pain with calf compression, swelling, warmth, erythema  Assessment: Preulcerative calluses; superficial skin breakdown left foot  Plan: -All treatment options discussed with the patient including all alternatives, risks, complications.  -She was reading the calluses of the right foot with any complications or bleeding.  Also debrided the preulcerative callus on the left foot due to the underlying ulceration.  Wound is granular.  No purulence.  I did prescribe doxycycline given the malodor.  Also will refer him to the wound care center.  Recommend mobilization, offloading and transcatheter is much as possible.  Advised against playing golf and other activities until this is healed.  He has tried to get new inserts from [facility]. -Monitor for any clinical signs or symptoms of infection and directed to call the office immediately should any occur or go to the ER.   Trula Slade DPM

## 2022-08-31 ENCOUNTER — Ambulatory Visit (INDEPENDENT_AMBULATORY_CARE_PROVIDER_SITE_OTHER): Payer: Medicare Other | Admitting: Podiatry

## 2022-08-31 DIAGNOSIS — Z794 Long term (current) use of insulin: Secondary | ICD-10-CM

## 2022-08-31 DIAGNOSIS — E1140 Type 2 diabetes mellitus with diabetic neuropathy, unspecified: Secondary | ICD-10-CM | POA: Diagnosis not present

## 2022-08-31 DIAGNOSIS — L97421 Non-pressure chronic ulcer of left heel and midfoot limited to breakdown of skin: Secondary | ICD-10-CM

## 2022-08-31 DIAGNOSIS — L84 Corns and callosities: Secondary | ICD-10-CM | POA: Diagnosis not present

## 2022-08-31 NOTE — Progress Notes (Unsigned)
Subjective Chief Complaint  Patient presents with   Diabetic Ulcer    Diabetic A1C-7.8 BG- not taking at this time, pre-ulcerative callus      61 year old male presents the office today for multiple preulcerative calluses and wound on the left foot.  The right foot is been doing well.  He states he may not have been taking care of the left foot the way he should have.  No current drainage or pus.  Denies any fevers or chills.  No other concerns.  Objective: AAO x3, NAD DP/PT pulses palpable bilaterally, CRT less than 3 seconds Protective sensation absent  Multiple preulcerative calluses are noted bilaterally.  On the right foot submetatarsal 1, 3, 5, heel and on the left side along the heel, midfoot as well as distally.  On the plantar aspect the left foot with an area that thick callus on the plantar medial aspect of the forefoot there is significant dried blood under the callus and there is a granular wound present.  Superficial granular wound noted underneath the remnant of the first metatarsal.  There is no probing to bone or tunneling.  No surrounding erythema, ascending cellulitis.  No fluctuance or crepitation.  There is no malodor. No pain with calf compression, swelling, warmth, erythema  Assessment: Preulcerative calluses; ulceration left foot  Plan: -All treatment options discussed with the patient including all alternatives, risks, complications.  -Debrided the wound today on the left without any complications and healthy, granular tissue.  No bleeding.  Recommended Silvadene dressing changes daily.  Continue offloading at all times.  Limiting activity as recommended.  Discussed daily foot inspection, offloading inserts. -Monitor for any clinical signs or symptoms of infection and directed to call the office immediately should any occur or go to the ER.  Return in about 4 weeks (around 09/28/2022).  Trula Slade DPM

## 2022-08-31 NOTE — Patient Instructions (Signed)
Wash foot with soap and water, dry well. Keep a small amount of silvadene on the wound and cover with a bandage. Change daily  Monitor for any signs/symptoms of infection. Call the office immediately if any occur or go directly to the emergency room. Call with any questions/concerns.

## 2022-09-28 ENCOUNTER — Ambulatory Visit: Payer: Medicare Other | Admitting: Podiatry

## 2022-09-28 DIAGNOSIS — Z794 Long term (current) use of insulin: Secondary | ICD-10-CM | POA: Diagnosis not present

## 2022-09-28 DIAGNOSIS — E1140 Type 2 diabetes mellitus with diabetic neuropathy, unspecified: Secondary | ICD-10-CM | POA: Diagnosis not present

## 2022-09-28 DIAGNOSIS — L84 Corns and callosities: Secondary | ICD-10-CM

## 2022-09-30 NOTE — Progress Notes (Signed)
Subjective Chief Complaint  Patient presents with   Callouses    Bilateral callus     61 year old male presents the office today for multiple preulcerative calluses and wound on the left foot.  He states overall he is doing better.  He thinks is because of the weather gets cold reasonably on his feet as much.  He has not seen any swelling or redness or any drainage or pus.  Denies any fevers or chills.  No other concerns.   Objective: AAO x3, NAD DP/PT pulses palpable bilaterally, CRT less than 3 seconds Protective sensation absent  Multiple preulcerative calluses are noted bilaterally.  On the right foot submetatarsal 1, 3, 5, heel and on the left side along the heel, midfoot as well as distally.  On the plantar aspect the left foot with an area that thick callus on the plantar medial aspect of the forefoot there is still noticed dried blood under the callus however this appears to be less.  Upon debridement there is no definitive open lesion.  No surrounding erythema, ascending cellulitis.  No fluctuance or crepitation.  There is no malodor. No pain with calf compression, swelling, warmth, erythema  Assessment: Preulcerative calluses  Plan: -All treatment options discussed with the patient including all alternatives, risks, complications.  -She will be debrided the calluses bilaterally without any complications however some amount of bleeding occurred on the left side where the dried blood.  Skin cleaned with alcohol and antibiotic ointment and dressing applied.  Continue daily dressing changes.  Encouraged offloading.  He is to monitor very closely for any signs or symptoms of infection given his history and should any symptoms occur to let us know.  Vivi Barrack DPM

## 2022-11-17 ENCOUNTER — Ambulatory Visit (INDEPENDENT_AMBULATORY_CARE_PROVIDER_SITE_OTHER): Payer: Medicare Other | Admitting: Podiatry

## 2022-11-17 ENCOUNTER — Encounter: Payer: Self-pay | Admitting: Podiatry

## 2022-11-17 VITALS — BP 158/91 | HR 84

## 2022-11-17 DIAGNOSIS — Z794 Long term (current) use of insulin: Secondary | ICD-10-CM

## 2022-11-17 DIAGNOSIS — E1140 Type 2 diabetes mellitus with diabetic neuropathy, unspecified: Secondary | ICD-10-CM | POA: Diagnosis not present

## 2022-11-17 DIAGNOSIS — L84 Corns and callosities: Secondary | ICD-10-CM

## 2022-11-21 NOTE — Progress Notes (Signed)
Subjective Chief Complaint  Patient presents with   Callouses    Trim calluses bilateral      62 year old male presents the office today for multiple preulcerative calluses and wound on the left foot.  He states overall he is doing better.  He has not seen any drainage or pus coming from them.  No open lesions that he reports.  Asking for new inserts.  Pressure chills.  Objective: AAO x3, NAD DP/PT pulses palpable bilaterally, CRT less than 3 seconds Protective sensation absent  Multiple preulcerative calluses are noted bilaterally.  On the right foot submetatarsal 1, 3, 5, heel and on the left side along the heel, midfoot as well as distally.  On the plantar aspect the left foot with an area that thick callus on the plantar medial aspect of the forefoot there is still noticed dried blood under the callus. Upon debridement there is no definitive open lesion.  No surrounding erythema, ascending cellulitis.  No fluctuance or crepitation.  There is no malodor. No pain with calf compression, swelling, warmth, erythema  Assessment: Preulcerative calluses  Plan: -All treatment options discussed with the patient including all alternatives, risks, complications.  -Sharply debrided the calluses bilaterally without any complications or bleeding.  Continue moisturizer, offloading.  New prescription for inserts was given to the patient.  He will this will help further offload.    Trula Slade DPM

## 2022-12-21 ENCOUNTER — Ambulatory Visit (INDEPENDENT_AMBULATORY_CARE_PROVIDER_SITE_OTHER): Payer: Medicare Other | Admitting: Podiatry

## 2022-12-21 VITALS — BP 133/74 | HR 83

## 2022-12-21 DIAGNOSIS — L84 Corns and callosities: Secondary | ICD-10-CM

## 2022-12-21 DIAGNOSIS — L97421 Non-pressure chronic ulcer of left heel and midfoot limited to breakdown of skin: Secondary | ICD-10-CM | POA: Diagnosis not present

## 2022-12-26 NOTE — Progress Notes (Signed)
Subjective Chief Complaint  Patient presents with   Callouses    Pre-ulcerative calluses bilateral. Patient reports feet are about the same since last visit. No concerns voiced     62 year old male presents the office today for multiple preulcerative calluses and wound on the left foot.  He has not seen any drainage or pus.  No swelling or redness.  He has no new concerns.    Objective: AAO x3, NAD DP/PT pulses palpable bilaterally, CRT less than 3 seconds Protective sensation absent  Multiple preulcerative calluses are noted bilaterally.  On the right foot submetatarsal 1, 3, 5, heel and on the left side along the heel, midfoot as well as distally.  On the plantar aspect the left foot with an area that thick callus on the plantar medial aspect of the forefoot there is still noticed dried blood under the callus.   No surrounding erythema, ascending cellulitis.  No fluctuance or crepitation.  There is no malodor. No pain with calf compression, swelling, warmth, erythema  Assessment: Preulcerative calluses  Plan: -All treatment options discussed with the patient including all alternatives, risks, complications.  -Sharply debrided the calluses with only minimal bleeding along the distal medial aspect of the left foot.  Antibiotic ointment dressing changes daily.  Continue moisturizer, offloading.  -Monitor for any clinical signs or symptoms of infection and directed to call the office immediately should any occur or go to the ER.  Return in about 4 weeks (around 01/18/2023).  Trula Slade DPM

## 2023-01-15 ENCOUNTER — Telehealth: Payer: Self-pay | Admitting: Podiatry

## 2023-01-15 NOTE — Telephone Encounter (Signed)
Patient called this morning, very confused he said he was given a prescription to take for orthotics, but was not told where to go to have them made?  Do you know where he was suppose to be taking the prescription to?

## 2023-01-21 ENCOUNTER — Ambulatory Visit (INDEPENDENT_AMBULATORY_CARE_PROVIDER_SITE_OTHER): Payer: Medicare Other | Admitting: Podiatry

## 2023-01-21 DIAGNOSIS — L84 Corns and callosities: Secondary | ICD-10-CM | POA: Diagnosis not present

## 2023-01-21 DIAGNOSIS — L97421 Non-pressure chronic ulcer of left heel and midfoot limited to breakdown of skin: Secondary | ICD-10-CM | POA: Diagnosis not present

## 2023-01-27 NOTE — Progress Notes (Addendum)
Subjective  62 year old male presents the office today for multiple preulcerative calluses and wound on the left foot.  He has not seen any drainage or pus.  No swelling or redness.  Fissure type lesion noted.  Objective: AAO x3, NAD DP/PT pulses palpable bilaterally, CRT less than 3 seconds Protective sensation absent  Multiple preulcerative calluses are noted bilaterally.  On the right foot submetatarsal 1, 3, 5, heel and on the left side along the heel, midfoot as well as distally.  On the plantar aspect the left foot with an area that thick callus on the plantar medial aspect of the forefoot there is still noticed dried blood under the callus.  Upon debridement there is professional skin fissure type lesion present.  This is also superficial no surrounding erythema, ascending cellulitis.  No fluctuance or crepitation.  There is no malodor. No pain with calf compression, swelling, warmth, erythema  Assessment: Preulcerative calluses  Plan: -All treatment options discussed with the patient including all alternatives, risks, complications.  -Sharply debrided the calluses with only minimal bleeding along the distal medial aspect of the left foot.  Antibiotic ointment dressing changes daily.  Continue moisturizer, offloading.  -Monitor for any clinical signs or symptoms of infection and directed to call the office immediately should any occur or go to the ER.  [place] has diabetic neuropathy. He has a history of left TMA and right hallux amputation. His current orthoeses are no longer provider appropriate suppport to his feet due to breakdown and excessive use. Custom diabetic inserts with bilateral partial foot toe filers are required to help provide appropirate cushioning and support to his feet. The inserts will offload areas of callus that are prone to ulcers and pain. The toe fillers will increase his balance while walking by feeling tissue increase in his biomechanical toe left the patient  requires use insert for ambulation and continued participation in his ADLs.  The custom inserts will be needed more than 6 months and require custom molded to prevent skin injury to his neuropathy.  Return in about 4 weeks (around 02/18/2023).  Trula Slade DPM

## 2023-02-22 ENCOUNTER — Ambulatory Visit (INDEPENDENT_AMBULATORY_CARE_PROVIDER_SITE_OTHER): Payer: Medicare Other | Admitting: Podiatry

## 2023-02-22 DIAGNOSIS — E1140 Type 2 diabetes mellitus with diabetic neuropathy, unspecified: Secondary | ICD-10-CM

## 2023-02-22 DIAGNOSIS — L97411 Non-pressure chronic ulcer of right heel and midfoot limited to breakdown of skin: Secondary | ICD-10-CM

## 2023-02-22 DIAGNOSIS — L84 Corns and callosities: Secondary | ICD-10-CM | POA: Diagnosis not present

## 2023-02-22 DIAGNOSIS — L97421 Non-pressure chronic ulcer of left heel and midfoot limited to breakdown of skin: Secondary | ICD-10-CM

## 2023-02-22 DIAGNOSIS — Z794 Long term (current) use of insulin: Secondary | ICD-10-CM

## 2023-02-22 NOTE — Progress Notes (Unsigned)
Subjective Chief Complaint  Patient presents with   Callouses    62 year old male presents the office today for multiple preulcerative calluses and wound on the left foot.  He has not seen any drainage or pus.  No swelling or redness.  He is having difficulty getting orthotics.  Objective: AAO x3, NAD DP/PT pulses palpable bilaterally, CRT less than 3 seconds Protective sensation absent  Multiple preulcerative calluses are noted bilaterally.  On the right foot submetatarsal 1, 3, 5, heel and on the left side along the heel, midfoot as well as distally.  On the plantar aspect the left foot with an area that thick callus on the plantar medial aspect of the forefoot there is still noticed dried blood under the callus.  Small superficial.  Skin thick calluses present bilaterally.  These are superficial abrasions or lesions underneath the calluses at.  There is no surrounding erythema, ascending cellulitis.  There is no fluctuance or crepitation and there is no malodor. No pain with calf compression, swelling, warmth, erythema  Assessment: Preulcerative calluses with superficial area skin breakdown  Plan: -All treatment options discussed with the patient including all alternatives, risks, complications.  -Sharply debrided the calluses with only minimal bleeding along the distal medial aspect of the left and right foot.  Antibiotic ointment dressing changes daily.  Continue moisturizer, offloading.   -Will check insurance predetermination. -Monitor for any clinical signs or symptoms of infection and directed to call the office immediately should any occur or go to the ER.  Charles Thompson has diabetic neuropathy. He has a history of left TMA and right hallux amputation. His current orthoeses are no longer provider appropriate suppport to his feet due to breakdown and excessive use. Custom diabetic inserts with bilateral partial foot toe filers are required to help provide appropirate cushioning and support  to his feet. The inserts will offload areas of callus that are prone to ulcers and pain. The toe fillers will increase his balance while walking by feeling tissue increase in his biomechanical toe left the patient requires use insert for ambulation and continued participation in his ADLs.  The custom inserts will be needed more than 6 months and require custom molded to prevent skin injury to his neuropathy.  Return in about 4 weeks (around 03/22/2023).  Vivi Barrack DPM

## 2023-03-29 ENCOUNTER — Ambulatory Visit (INDEPENDENT_AMBULATORY_CARE_PROVIDER_SITE_OTHER): Payer: Medicare Other | Admitting: Podiatry

## 2023-03-29 VITALS — BP 150/67 | HR 60

## 2023-03-29 DIAGNOSIS — L97421 Non-pressure chronic ulcer of left heel and midfoot limited to breakdown of skin: Secondary | ICD-10-CM | POA: Diagnosis not present

## 2023-03-29 DIAGNOSIS — L84 Corns and callosities: Secondary | ICD-10-CM | POA: Diagnosis not present

## 2023-03-29 DIAGNOSIS — L97411 Non-pressure chronic ulcer of right heel and midfoot limited to breakdown of skin: Secondary | ICD-10-CM | POA: Diagnosis not present

## 2023-04-03 NOTE — Progress Notes (Signed)
Subjective Chief Complaint  Patient presents with   Callouses    Patient reports ongoing bilateral calluses. Denies nausea, vomiting, fever, and chills. No concerns voiced today by patient.     62 year old male presents the office today for multiple preulcerative calluses and wound on the left foot.  Patient has been doing well.  Denies any open lesions and no swelling redness or any drainage.  He is still awaiting any other inserts to hopefully help offload.   Objective: AAO x3, NAD DP/PT pulses palpable bilaterally, CRT less than 3 seconds Protective sensation absent  Multiple preulcerative calluses are noted bilaterally.  On the right foot submetatarsal 1, 3, 5, heel and on the left side along the heel, midfoot as well as distally.  On the plantar aspect the left foot with an area that thick callus on the plantar medial aspect of the forefoot there is still noticed dried blood under the callus.  Small superficial.  Skin thick calluses present bilaterally.  These are superficial abrasions or lesions underneath the calluses at.  There is no surrounding erythema, ascending cellulitis.  There is no fluctuance or crepitation and there is no malodor. No pain with calf compression, swelling, warmth, erythema Overall, about the same.   Assessment: Preulcerative calluses with superficial area skin breakdown  Plan: -All treatment options discussed with the patient including all alternatives, risks, complications.  -Sharply debrided the calluses with only minimal bleeding along the distal medial aspect of the left and right foot.  Antibiotic ointment dressing changes daily.  Continue moisturizer, offloading.   -Will check insurance predetermination. -Monitor for any clinical signs or symptoms of infection and directed to call the office immediately should any occur or go to the ER.  Return in about 4 weeks (around 04/26/2023) for pre-ucerative callus.  Vivi Barrack DPM

## 2023-05-03 ENCOUNTER — Ambulatory Visit (INDEPENDENT_AMBULATORY_CARE_PROVIDER_SITE_OTHER): Payer: Medicare Other | Admitting: Podiatry

## 2023-05-03 DIAGNOSIS — M79672 Pain in left foot: Secondary | ICD-10-CM

## 2023-05-03 DIAGNOSIS — G629 Polyneuropathy, unspecified: Secondary | ICD-10-CM | POA: Diagnosis not present

## 2023-05-03 DIAGNOSIS — L84 Corns and callosities: Secondary | ICD-10-CM | POA: Diagnosis not present

## 2023-05-03 DIAGNOSIS — G8929 Other chronic pain: Secondary | ICD-10-CM

## 2023-05-03 DIAGNOSIS — M79671 Pain in right foot: Secondary | ICD-10-CM

## 2023-05-03 DIAGNOSIS — L97421 Non-pressure chronic ulcer of left heel and midfoot limited to breakdown of skin: Secondary | ICD-10-CM

## 2023-05-03 NOTE — Progress Notes (Signed)
Subjective Chief Complaint  Patient presents with   Callouses    Pt stated that things are about the same      63 year old male presents the office today for multiple preulcerative calluses and wound on the left foot.  He has not seen any drainage or pus or any swelling or redness.  He is concerned about the neuropathy and stabbing pain to his feet he is interested in a referral to pain clinic which we discussed previously.  Objective: AAO x3, NAD DP/PT pulses palpable bilaterally, CRT less than 3 seconds Protective sensation absent  Multiple preulcerative calluses are noted bilaterally.  On the right foot submetatarsal 1, 3, 5, heel and on the left side along the heel, midfoot as well as distally.  On the plantar aspect the left foot with an area that thick callus on the plantar medial aspect of the forefoot there is still noticed dried blood under the callus.  Upon debridement there is still small superficial opening and minimal bleeding occurred during the debridement.  There is no purulence.  No surrounding erythema, ascending cellulitis.  No fluctuation, crepitation, malodor. no pain with calf compression, swelling, warmth, erythema Overall, about the same.   Assessment: Preulcerative calluses with superficial area skin breakdown  Plan: -All treatment options discussed with the patient including all alternatives, risks, complications.  -Sharply debrided the calluses with only minimal bleeding along the distal medial aspect of the left and right foot.  Antibiotic ointment dressing changes daily.  Continue moisturizer, offloading.   -He got his new inserts from [facility].  He feels they may make him fall.  Discussed gradually breaking them and to get used to them.  Offered physical therapy.  If needed I would return back to [facility] for modifications. -Referral to pain clinic.  Neuropathic pain, chronic foot pain with history of bilateral transmetatarsal amputations -Monitor for  any clinical signs or symptoms of infection and directed to call the office immediately should any occur or go to the ER.  Vivi Barrack DPM

## 2023-06-03 ENCOUNTER — Ambulatory Visit: Payer: Medicare Other | Admitting: Podiatry

## 2023-06-08 ENCOUNTER — Encounter: Payer: Self-pay | Admitting: Podiatry

## 2023-06-08 ENCOUNTER — Ambulatory Visit (INDEPENDENT_AMBULATORY_CARE_PROVIDER_SITE_OTHER): Payer: Medicare Other | Admitting: Podiatry

## 2023-06-08 DIAGNOSIS — E1140 Type 2 diabetes mellitus with diabetic neuropathy, unspecified: Secondary | ICD-10-CM

## 2023-06-08 DIAGNOSIS — G629 Polyneuropathy, unspecified: Secondary | ICD-10-CM | POA: Diagnosis not present

## 2023-06-08 DIAGNOSIS — L84 Corns and callosities: Secondary | ICD-10-CM

## 2023-06-08 DIAGNOSIS — Z89432 Acquired absence of left foot: Secondary | ICD-10-CM | POA: Diagnosis not present

## 2023-06-08 DIAGNOSIS — Z89411 Acquired absence of right great toe: Secondary | ICD-10-CM

## 2023-06-08 DIAGNOSIS — Z794 Long term (current) use of insulin: Secondary | ICD-10-CM

## 2023-06-08 NOTE — Progress Notes (Signed)
This patient returns to my office for at risk foot care.  This patient requires this care by a professional since this patient will be at risk due to having diabetic neuropathy with TMA left foot and amputation right big toe.Marland Kitchen  He has been history of infection feet.  Patient has developed thick painful callus both feet.  He has been a patient of Dr.  Loreta Ave.   These callus  are painful walking and wearing shoes.  This patient presents for at risk foot care today.  General Appearance  Alert, conversant and in no acute stress.  Vascular  Dorsalis pedis and posterior tibial  pulses are palpable  bilaterally.  Capillary return is within normal limits  bilaterally. Temperature is within normal limits  bilaterally.  Neurologic  Senn-Weinstein monofilament wire test diminished  bilaterally. Muscle power within normal limits bilaterally.  Nails Thick disfigured discolored nails with subungual debris  from second to fifth toe right foot.bilaterally. No evidence of bacterial infection or drainage bilaterally. TMA left foot.  Amputation of great toe right foot.   Orthopedic  No limitations of motion  feet .  No crepitus or effusions noted.  TMA left foot.  Amputation right hallux.  Skin  normotropic skin with no porokeratosis noted bilaterally.  No signs of infections or ulcers noted.   Pre-ulcerous callus 1,3,5 right forefoot.  Heel callus  B/L.  Forefoot callus left foot. Callus following  surgery feet  B/L.  Consent was obtained for treatment procedures.    Filed  multiple callus B/L with dremel without incident. Added padding to foot orthoses right foot with the help of the pedorthist.     Return office visit    4 weeks                  Told patient to return for periodic foot care and evaluation due to potential at risk complications. Upon leaving he told me he would rather follow up with Dr.  Ardelle Anton in 4 weeks.  Helane Gunther DPM    Helane Gunther DPM

## 2023-06-17 ENCOUNTER — Telehealth: Payer: Self-pay | Admitting: Podiatry

## 2023-06-17 NOTE — Telephone Encounter (Signed)
Pt wants a call back talk about his feet

## 2023-06-18 ENCOUNTER — Ambulatory Visit (INDEPENDENT_AMBULATORY_CARE_PROVIDER_SITE_OTHER): Payer: Medicare Other | Admitting: Podiatry

## 2023-06-18 DIAGNOSIS — L84 Corns and callosities: Secondary | ICD-10-CM

## 2023-06-18 DIAGNOSIS — E1140 Type 2 diabetes mellitus with diabetic neuropathy, unspecified: Secondary | ICD-10-CM

## 2023-06-18 DIAGNOSIS — Z794 Long term (current) use of insulin: Secondary | ICD-10-CM

## 2023-06-18 NOTE — Telephone Encounter (Signed)
Pt called back stating he missed your call and is asking if he could come in today.

## 2023-06-20 NOTE — Progress Notes (Signed)
Subjective Chief Complaint  Patient presents with   Callouses    Callus trim to bilateral feet. Diabetic.     62 year old male presents the office today for multiple preulcerative calluses to his feet.  He recently did get new walking shoes which he thinks will be helpful and is also had inserts adjusted.  Calluses are still thickened and causes pain but no open lesions that he reports.  No drainage or pus.  No other concerns.    Objective: AAO x3, NAD DP/PT pulses palpable bilaterally, CRT less than 3 seconds Protective sensation absent  Multiple preulcerative calluses are noted bilaterally.  On the right foot submetatarsal 1, 3, 5, heel and on the left side along the heel, midfoot as well as distally.  There is no underlying ulceration drainage or any signs of infection noted today but the areas are still preulcerative with the left side worse than the right.  No drainage or pus.  No edema, erythema. No pain with calf compression, swelling, warmth, erythema Overall, about the same.   Assessment: Preulcerative calluses bilaterally  Plan: -All treatment options discussed with the patient including all alternatives, risks, complications.  -Debrided all the hyperkeratotic lesions with any complications or bleeding today.  Discussed moisturizer.  He did get new shoes and orthotics adjusted so this will be beneficial as well.  Monitor any signs or symptoms of infection or skin breakdown.  Return in about 4 weeks (around 07/16/2023) for pre-ulcerative callus.  Vivi Barrack DPM

## 2023-06-29 ENCOUNTER — Ambulatory Visit: Payer: Medicare Other | Admitting: Podiatry

## 2023-07-15 ENCOUNTER — Ambulatory Visit (INDEPENDENT_AMBULATORY_CARE_PROVIDER_SITE_OTHER): Payer: Medicare Other | Admitting: Podiatry

## 2023-07-15 DIAGNOSIS — E1140 Type 2 diabetes mellitus with diabetic neuropathy, unspecified: Secondary | ICD-10-CM

## 2023-07-15 DIAGNOSIS — L84 Corns and callosities: Secondary | ICD-10-CM | POA: Diagnosis not present

## 2023-07-15 DIAGNOSIS — Z794 Long term (current) use of insulin: Secondary | ICD-10-CM

## 2023-07-22 ENCOUNTER — Ambulatory Visit: Payer: Medicare Other | Admitting: Podiatry

## 2023-07-24 NOTE — Progress Notes (Signed)
Subjective No chief complaint on file.   62 year old male presents the office today for multiple preulcerative calluses to his feet.  He has been wearing the new inserts.  He is not sure if he has had a modified again since I saw him last.  Does not report any fevers or chills.  No drainage or pus that he notes.    Objective: AAO x3, NAD DP/PT pulses palpable bilaterally, CRT less than 3 seconds Protective sensation absent  Multiple preulcerative calluses are noted bilaterally.  On the right foot submetatarsal 1, 3, 5, heel and on the left side along the heel, midfoot as well as distally.  Side distally.  Acute areas have dried blood and upon debridement there was superficial opening present.  There is no drainage or pus.  There is no fluctuation or crepitation.  There is no malodor. No pain with calf compression, swelling, warmth, erythema Overall, about the same.   Assessment: Preulcerative calluses bilaterally  Plan: -All treatment options discussed with the patient including all alternatives, risks, complications.  -Debrided all the hyperkeratotic lesions.  On the left foot distally there was small bleeding as there was some ulcerations and dried blood underneath the callus.  Recommend antibiotic, dressing changes daily.  Offloading.  I recommend him follow back up with [facility] about modifications to his inserts.  -Monitor for any clinical signs or symptoms of infection and directed to call the office immediately should any occur or go to the ER.  Return in about 4 weeks (around 08/12/2023).  Vivi Barrack DPM

## 2023-08-16 ENCOUNTER — Ambulatory Visit (INDEPENDENT_AMBULATORY_CARE_PROVIDER_SITE_OTHER): Payer: Medicare Other | Admitting: Podiatry

## 2023-08-16 DIAGNOSIS — L97421 Non-pressure chronic ulcer of left heel and midfoot limited to breakdown of skin: Secondary | ICD-10-CM

## 2023-08-16 DIAGNOSIS — Z794 Long term (current) use of insulin: Secondary | ICD-10-CM | POA: Diagnosis not present

## 2023-08-16 DIAGNOSIS — L84 Corns and callosities: Secondary | ICD-10-CM | POA: Diagnosis not present

## 2023-08-16 DIAGNOSIS — E1140 Type 2 diabetes mellitus with diabetic neuropathy, unspecified: Secondary | ICD-10-CM | POA: Diagnosis not present

## 2023-08-17 NOTE — Progress Notes (Signed)
Subjective Chief Complaint  Patient presents with   Callouses    62 year old male presents the office today for multiple preulcerative calluses to his feet.  He has been wearing the new inserts.  He has not yet had the orthotics adjusted but he thinks is almost time.  New pair of inserts.  He thinks this is beneficial.  He did have a large piece of skin that came off the left foot he does not better since then.  No drainage or pus.  No swelling or redness.  No fevers or chills.  No other concerns.      Objective: AAO x3, NAD DP/PT pulses palpable bilaterally, CRT less than 3 seconds Protective sensation absent  Multiple preulcerative calluses are noted bilaterally.  On the right foot submetatarsal 1, 3, 5, heel and on the left side along the heel, midfoot as well as distally.  There are some dried blood present along the distal aspect left forefoot however overall for calluses and to be little bit smaller and improved compared to what they had been previously.  There is no new ulcerations present.  There is no surrounding erythema, ascending cellulitis.  No drainage or pus.  No fluctuation or crepitation.  Neurologic.  Assessment: Preulcerative calluses bilaterally  Plan: -All treatment options discussed with the patient including all alternatives, risks, complications.  -Debrided all the hyperkeratotic lesions.  On the left foot distally there was small bleeding as there was some ulcerations and dried blood underneath the callus.  This appears to be improved compared to last appointment as well.  Recommend antibiotic, dressing changes daily.  Offloading.  I recommend him follow back up with [facility] about modifications to his inserts.  -Offered new prescription for inserts today but he thinks he does not need it he is on contact the clinic where he gets the inserts from.  -Monitor for any clinical signs or symptoms of infection and directed to call the office immediately should any occur or go  to the ER.  Return in about 6 weeks (around 09/27/2023) for pre-ulcerative callus.  Vivi Barrack DPM

## 2023-09-21 ENCOUNTER — Encounter: Payer: Self-pay | Admitting: Podiatry

## 2023-09-27 ENCOUNTER — Ambulatory Visit: Payer: Medicare Other | Admitting: Podiatry

## 2023-10-11 ENCOUNTER — Ambulatory Visit: Payer: Medicare Other | Admitting: Podiatry

## 2023-12-17 ENCOUNTER — Ambulatory Visit (INDEPENDENT_AMBULATORY_CARE_PROVIDER_SITE_OTHER): Payer: Medicare Other | Admitting: Podiatry

## 2023-12-17 ENCOUNTER — Encounter: Payer: Self-pay | Admitting: Podiatry

## 2023-12-17 DIAGNOSIS — E1140 Type 2 diabetes mellitus with diabetic neuropathy, unspecified: Secondary | ICD-10-CM

## 2023-12-17 DIAGNOSIS — Z794 Long term (current) use of insulin: Secondary | ICD-10-CM

## 2023-12-17 DIAGNOSIS — L84 Corns and callosities: Secondary | ICD-10-CM

## 2023-12-17 DIAGNOSIS — L97421 Non-pressure chronic ulcer of left heel and midfoot limited to breakdown of skin: Secondary | ICD-10-CM

## 2023-12-20 NOTE — Progress Notes (Addendum)
 Subjective Chief Complaint  Patient presents with   RFC    RM#14 RFC        63 year old male presents the office today for multiple preulcerative calluses to his feet.  This is becoming quite thick.  No open lesions or any drainage that he reports.  No recent swelling or redness.  He was to get new inserts.  No fevers or chills.  No other concerns.     Objective: AAO x3, NAD DP/PT pulses palpable bilaterally, CRT less than 3 seconds Protective sensation absent  Multiple preulcerative calluses are noted bilaterally.  On the right foot submetatarsal 1, 3, 5, heel and on the left side along the heel, midfoot as well as distally.  There is a bit thicker today compared to prior.  Upon debridement there is no underlying ulceration to the areas and oriented.  Minimal bleeding occurred from the debridement on the left foot callus.  There is no surrounding erythema, ascending cellulitis.  No fluctuance or crepitation.  No malodor.   No pain with calf compression no erythema warmth  Assessment: Preulcerative calluses bilaterally  Plan: -All treatment options discussed with the patient including all alternatives, risks, complications.  -Debrided all the hyperkeratotic lesions x 6.  On the left foot distally there was small bleeding as there was some ulcerations and dried blood underneath the callus.  -New prescription for orthotics were given to the patient. Kay has a history of a left TMA and a right hallux amputation. His current partial foot inserts are no longer appropriate due to the physiological change of increased preulcerative calluses. New partial foot toe fillers are required in order to properly offload the callus areas and to increase his biomechanical toe lever for ambulation.  -Monitor for any clinical signs or symptoms of infection and directed to call the office immediately should any occur or go to the ER.  Return in about 2 months (around 02/17/2024).  Vivi Barrack DPM

## 2024-02-18 ENCOUNTER — Encounter: Payer: Self-pay | Admitting: Podiatry

## 2024-02-18 ENCOUNTER — Ambulatory Visit (INDEPENDENT_AMBULATORY_CARE_PROVIDER_SITE_OTHER): Payer: Medicare Other | Admitting: Podiatry

## 2024-02-18 DIAGNOSIS — Z794 Long term (current) use of insulin: Secondary | ICD-10-CM

## 2024-02-18 DIAGNOSIS — Z89432 Acquired absence of left foot: Secondary | ICD-10-CM

## 2024-02-18 DIAGNOSIS — E1140 Type 2 diabetes mellitus with diabetic neuropathy, unspecified: Secondary | ICD-10-CM | POA: Diagnosis not present

## 2024-02-18 DIAGNOSIS — Z89431 Acquired absence of right foot: Secondary | ICD-10-CM

## 2024-02-18 DIAGNOSIS — L84 Corns and callosities: Secondary | ICD-10-CM

## 2024-02-22 NOTE — Progress Notes (Signed)
 Subjective Chief Complaint  Patient presents with   Nail Problem    Patient is here for RFC, everything has been fine since last visit    63 year old male presents the office today for multiple preulcerative calluses to his feet.  He states that he can thickened painful.  He has tried to get diabetic shoes, inserts.    Objective: AAO x3, NAD DP/PT pulses palpable bilaterally, CRT less than 3 seconds Protective sensation absent  Multiple preulcerative calluses are noted bilaterally.  On the right foot submetatarsal 1, 3, 5, heel and on the left side along the heel, midfoot as well as distally.  Significantly improved compared with they have been previously.  Upon debridement of the right foot there is no skin breakdown identified otherwise.  There is no surrounding erythema, ascending cellulitis.  No fluctuation or crepitation.  There is no malodor. No pain with calf compression no erythema warmth  Assessment: Preulcerative calluses bilaterally  Plan: -All treatment options discussed with the patient including all alternatives, risks, complications.  -Debrided all the hyperkeratotic lesions x 6.  On the right foot distally there was small bleeding as there was some ulcerations and dried blood underneath the callus.  -He still try to work on a diabetic shoes/inserts. I will have him follow up with [facility] for this.   Vivi Barrack DPM

## 2024-03-22 ENCOUNTER — Telehealth: Payer: Self-pay | Admitting: Podiatry

## 2024-03-22 ENCOUNTER — Ambulatory Visit (INDEPENDENT_AMBULATORY_CARE_PROVIDER_SITE_OTHER): Admitting: Podiatry

## 2024-03-22 DIAGNOSIS — E1140 Type 2 diabetes mellitus with diabetic neuropathy, unspecified: Secondary | ICD-10-CM

## 2024-03-22 DIAGNOSIS — L97521 Non-pressure chronic ulcer of other part of left foot limited to breakdown of skin: Secondary | ICD-10-CM | POA: Diagnosis not present

## 2024-03-22 DIAGNOSIS — Z794 Long term (current) use of insulin: Secondary | ICD-10-CM | POA: Diagnosis not present

## 2024-03-22 NOTE — Telephone Encounter (Signed)
 Pt checked in for his appt for 03/22/2024. He has a balance of $1700 and refused to to pay his copay for today's visit. I let mgmt know and per shelly, he was made aware that we will bill his copay for this appt and will need to pay his copay for his next appt. Pt stated he wont pay.

## 2024-03-22 NOTE — Progress Notes (Signed)
 Subjective:  Patient ID: Charles Thompson, male    DOB: [birth year],  MRN: [id number]  Chief Complaint  Patient presents with   Wound Check    63 y.o. male presents with the above complaint.  Patient presents with complaint of left worsening TMA callus.  He states been present for quite some time is known to Dr. Mabel Savage.  He is a diabetic.  He denies any other acute complaints would like to have it treated.  He does not have any nausea fever chills vomiting.  No cellulitis noted.   Review of Systems: Negative except as noted in the HPI. Denies N/V/F/Ch.  Past Medical History:  Diagnosis Date   Anxiety    Arthritis    neck   CKD (chronic kidney disease), stage II    Diabetic peripheral neuropathy (HCC)    Diabetic ulcer of left foot (HCC)    First degree heart block    GERD (gastroesophageal reflux disease)    History of cellulitis 09/2020   left lower leg   History of osteomyelitis    bilateral feet   Hyperlipidemia    Hypertension    followed by pcp  (12-14--2021 per pt never had a stress test)   MDD (major depressive disorder)    Type 2 diabetes mellitus with insulin  therapy (HCC)    followed by pcp   (01-17-2021  pt stated checks blood sugar 6 times weekly in am,  fasting sugar--- 120)    Current Outpatient Medications:    amLODipine  (NORVASC ) 10 MG tablet, Take 10 mg by mouth daily., Disp: , Rfl:    aspirin  EC 81 MG tablet, Take 81 mg by mouth daily. Swallow whole., Disp: , Rfl:    atorvastatin  (LIPITOR) 10 MG tablet, Take 10 mg by mouth daily., Disp: , Rfl:    busPIRone (BUSPAR) 7.5 MG tablet, Take 7.5 mg by mouth 2 (two) times daily., Disp: , Rfl:    clonazePAM  (KLONOPIN ) 1 MG tablet, Take 1 tablet (1 mg total) by mouth at bedtime. (Patient taking differently: Take 1 mg by mouth at bedtime as needed for anxiety.), Disp: 90 tablet, Rfl: 1   Empagliflozin-metFORMIN HCl (SYNJARDY) 12.03-999 MG TABS, Take 1 tablet by mouth daily., Disp: , Rfl:    fluticasone (FLONASE) 50  MCG/ACT nasal spray, Place 1 spray into both nostrils 2 (two) times daily as needed for allergies., Disp: , Rfl:    hydrOXYzine  (ATARAX ) 25 MG tablet, Take 25 mg by mouth at bedtime., Disp: , Rfl:    ibuprofen (ADVIL) 200 MG tablet, Take 400 mg by mouth every 6 (six) hours as needed for headache (pain)., Disp: , Rfl:    insulin  glargine, 1 Unit Dial , (TOUJEO  SOLOSTAR) 300 UNIT/ML Solostar Pen, Inject 50 Units into the skin See admin instructions. Inject 50 units subcutaneously twice daily - noon and bedtime, Disp: , Rfl:    Lancets (ONETOUCH DELICA PLUS LANCET33G) MISC, USE TO TEST BLOOD SUGAR THREE TIMES DAILY, Disp: , Rfl:    lisinopril -hydrochlorothiazide  (ZESTORETIC ) 20-12.5 MG tablet, Take 1 tablet by mouth daily. (Patient taking differently: Take 1 tablet by mouth in the morning and at bedtime.), Disp: , Rfl:    nicotine  (NICODERM CQ  - DOSED IN MG/24 HOURS) 21 mg/24hr patch, Place 1 patch (21 mg total) onto the skin daily., Disp: 28 patch, Rfl: 0   omeprazole (PRILOSEC) 20 MG capsule, Take 20 mg by mouth daily as needed (heartburn). , Disp: , Rfl:    ONETOUCH VERIO test strip, USE TO TEST BLOOD SUGAR THREE  TIMES DAILY, Disp: , Rfl:    sertraline  (ZOLOFT ) 100 MG tablet, Take 1 tablet (100 mg total) by mouth 2 (two) times daily., Disp: 60 tablet, Rfl: 2   silver  sulfADIAZINE  (SILVADENE ) 1 % cream, Apply pea-sized amount to wound daily., Disp: 50 g, Rfl: 0   silver  sulfADIAZINE  (SILVADENE ) 1 % cream, Apply 1 Application topically daily., Disp: 50 g, Rfl: 0  Social History   Tobacco Use  Smoking Status Every Day   Current packs/day: 1.00   Types: Cigarettes  Smokeless Tobacco Never    No Known Allergies Objective:  There were no vitals filed for this visit. There is no height or weight on file to calculate BMI. Constitutional Well developed. Well nourished.  Vascular Dorsalis pedis pulses palpable bilaterally. Posterior tibial pulses palpable bilaterally. Capillary refill normal to  all digits.  No cyanosis or clubbing noted. Pedal hair growth normal.  Neurologic Normal speech. Oriented to person, place, and time. Epicritic sensation to light touch grossly present bilaterally.  Dermatologic Left transmetatarsal amputation stump site ulceration limited to the breakdown of the skin.  No purulent drainage noted no malodor present does not probe down to deep tissue.  Orthopedic: Normal joint ROM without pain or crepitus bilaterally. No visible deformities. No bony tenderness.   Radiographs: None Assessment:   1. Type 2 diabetes mellitus with diabetic neuropathy, with long-term current use of insulin  (HCC)   2. Ulcer of left foot, limited to breakdown of skin [facility])    Plan:  Patient was evaluated and treated and all questions answered.  Left transmetatarsal amputation site wound limited to the breakdown of the skin - All questions and concerns were discussed with the patient in extensive detail - I encouraged him to do Betadine  wet-to-dry dressing changes to help heal the ulceration. - He will see Dr. Clydia Dart back for further evaluation and management - Discussed putting more weight to the heel with surgical shoe he states understanding - I discussed with him if the ulceration regresses or if he notices any signs of infection go to the emergency room right away he states understanding will do so immediately -  No follow-ups on file.

## 2024-04-20 ENCOUNTER — Ambulatory Visit (INDEPENDENT_AMBULATORY_CARE_PROVIDER_SITE_OTHER)

## 2024-04-20 ENCOUNTER — Ambulatory Visit: Admitting: Podiatry

## 2024-04-20 DIAGNOSIS — L97522 Non-pressure chronic ulcer of other part of left foot with fat layer exposed: Secondary | ICD-10-CM

## 2024-04-20 DIAGNOSIS — L97521 Non-pressure chronic ulcer of other part of left foot limited to breakdown of skin: Secondary | ICD-10-CM

## 2024-04-20 DIAGNOSIS — L03116 Cellulitis of left lower limb: Secondary | ICD-10-CM

## 2024-04-20 MED ORDER — MUPIROCIN 2 % EX OINT: 1.0000 | TOPICAL_OINTMENT | Freq: Two times a day (BID) | CUTANEOUS | 2 refills | Status: AC

## 2024-04-20 MED ORDER — DOXYCYCLINE HYCLATE 100 MG PO TABS
100.0000 mg | ORAL_TABLET | Freq: Two times a day (BID) | ORAL | 0 refills | Status: AC
Start: 2024-04-20 — End: ?

## 2024-04-20 NOTE — Progress Notes (Signed)
 Subjective: Chief Complaint  Patient presents with   Foot Ulcer    RM#13 Bilateral foot ulcers left being the worse at this time very concerned of state of his foot.   63 year old male presents the office with concerns of a wound on his left foot.  This been present for about 1 month and he does get drainage from the area but no pus.  He states that skin is come off.  He still been wearing regular shoes after been golfing as well which he thinks made the foot worse.  He does not feel that the wound is infected.  Does not report any fevers or chills.  Objective: AAO x3, NAD DP/PT pulses palpable bilaterally, CRT less than 3 seconds Full-thickness ulceration of the distal aspect of the left foot as pictured below.  Hyperkeratotic periwound with some necrotic as well as greenish tissue present on the wound bed.  Prior to debridement it measured 2 x 1 cm.  After debrided the wound measures 2 and half by 1/2 x 0.5 cm without any probing to bone although it is close distally.  There is localized erythema without any ascending cellulitis.  No fluctuation or crepitation. Multiple preulcerative calluses noted bilateral feet but no other open lesions.  There is prominence of metatarsal heads plantarly as well as purulence on the heel. No pain with calf compression, swelling, warmth, erythema    Assessment: Ulceration left foot  Plan: Radiology: -X-rays obtained reviewed.  Multiple views obtained.  No definitive cortical changes suggest osteomyelitis at this time.  Ulcer left foot: -Medically necessary wound debridement performed today.  Sharply debrided the wound with a #312 with scalpel remove nonviable, devitalized tissue in order to promote wound healing.  Pre and post 2 measurements are noted above.  There is minimal blood loss.  Hemostasis achieved through medial compression.  Silvadene  applied followed by dressing. -Mupirocin  ointment dressing changes daily. - Surgical shoe for  offloading  Cellulitis -Prescribed doxycycline   -He has surgical shoe at home and recommended doing bridges for offloading.  Advised against golfing and limiting activity.  -Show patient to the other hyperkeratotic lesions and complications or bleeding. -Monitor for any clinical signs or symptoms of infection and directed to call the office immediately should any occur or go to the ER. -Patient encouraged to call the office with any questions, concerns, change in symptoms.    Charity Conch DPM

## 2024-04-28 ENCOUNTER — Ambulatory Visit: Admitting: Podiatry

## 2024-05-04 ENCOUNTER — Ambulatory Visit: Admitting: Podiatry

## 2024-05-16 DEATH — deceased

## 2024-07-04 ENCOUNTER — Other Ambulatory Visit (HOSPITAL_COMMUNITY): Payer: Self-pay

## 2024-08-11 ENCOUNTER — Other Ambulatory Visit: Payer: Self-pay
